# Patient Record
Sex: Female | Born: 1965
Health system: Southern US, Community
[De-identification: ages and names within clinical notes are randomized; demographics above are authoritative.]

## PROBLEM LIST (undated history)

## (undated) DIAGNOSIS — D649 Anemia, unspecified: Secondary | ICD-10-CM

## (undated) DIAGNOSIS — I2699 Other pulmonary embolism without acute cor pulmonale: Secondary | ICD-10-CM

## (undated) DIAGNOSIS — N189 Chronic kidney disease, unspecified: Secondary | ICD-10-CM

## (undated) DIAGNOSIS — R600 Localized edema: Secondary | ICD-10-CM

## (undated) DIAGNOSIS — L03119 Cellulitis of unspecified part of limb: Secondary | ICD-10-CM

## (undated) DIAGNOSIS — M1A9XX0 Chronic gout, unspecified, without tophus (tophi): Secondary | ICD-10-CM

## (undated) HISTORY — DX: Localized edema: R60.0

## (undated) HISTORY — DX: Other pulmonary embolism without acute cor pulmonale: I26.99

## (undated) HISTORY — DX: Cellulitis of unspecified part of limb: L03.119

## (undated) HISTORY — DX: Chronic kidney disease, unspecified: N18.9

## (undated) HISTORY — DX: Anemia, unspecified: D64.9

## (undated) HISTORY — DX: Chronic gout, unspecified, without tophus (tophi): M1A.9XX0

---

## 2015-07-29 DIAGNOSIS — D638 Anemia in other chronic diseases classified elsewhere: Secondary | ICD-10-CM | POA: Diagnosis not present

## 2015-07-29 DIAGNOSIS — R079 Chest pain, unspecified: Secondary | ICD-10-CM | POA: Diagnosis not present

## 2015-07-29 DIAGNOSIS — K219 Gastro-esophageal reflux disease without esophagitis: Secondary | ICD-10-CM | POA: Diagnosis not present

## 2015-07-29 DIAGNOSIS — J069 Acute upper respiratory infection, unspecified: Secondary | ICD-10-CM | POA: Diagnosis not present

## 2015-07-29 DIAGNOSIS — R0602 Shortness of breath: Secondary | ICD-10-CM | POA: Diagnosis not present

## 2015-07-29 DIAGNOSIS — I120 Hypertensive chronic kidney disease with stage 5 chronic kidney disease or end stage renal disease: Secondary | ICD-10-CM | POA: Diagnosis not present

## 2015-07-29 DIAGNOSIS — N186 End stage renal disease: Secondary | ICD-10-CM | POA: Diagnosis not present

## 2015-07-29 DIAGNOSIS — N189 Chronic kidney disease, unspecified: Secondary | ICD-10-CM | POA: Diagnosis not present

## 2015-07-29 DIAGNOSIS — E785 Hyperlipidemia, unspecified: Secondary | ICD-10-CM | POA: Diagnosis not present

## 2015-07-29 DIAGNOSIS — R0789 Other chest pain: Secondary | ICD-10-CM | POA: Diagnosis not present

## 2015-07-30 DIAGNOSIS — R0789 Other chest pain: Secondary | ICD-10-CM | POA: Diagnosis not present

## 2015-07-30 DIAGNOSIS — R079 Chest pain, unspecified: Secondary | ICD-10-CM | POA: Diagnosis not present

## 2015-08-03 DIAGNOSIS — R05 Cough: Secondary | ICD-10-CM | POA: Diagnosis not present

## 2015-08-03 DIAGNOSIS — R079 Chest pain, unspecified: Secondary | ICD-10-CM | POA: Diagnosis not present

## 2015-08-03 DIAGNOSIS — N189 Chronic kidney disease, unspecified: Secondary | ICD-10-CM | POA: Diagnosis not present

## 2015-08-08 DIAGNOSIS — Z3042 Encounter for surveillance of injectable contraceptive: Secondary | ICD-10-CM | POA: Diagnosis not present

## 2015-08-08 DIAGNOSIS — N920 Excessive and frequent menstruation with regular cycle: Secondary | ICD-10-CM | POA: Diagnosis not present

## 2015-08-08 DIAGNOSIS — N938 Other specified abnormal uterine and vaginal bleeding: Secondary | ICD-10-CM | POA: Diagnosis not present

## 2015-09-28 DIAGNOSIS — R600 Localized edema: Secondary | ICD-10-CM | POA: Diagnosis not present

## 2015-09-28 DIAGNOSIS — N185 Chronic kidney disease, stage 5: Secondary | ICD-10-CM | POA: Diagnosis not present

## 2015-09-28 DIAGNOSIS — I120 Hypertensive chronic kidney disease with stage 5 chronic kidney disease or end stage renal disease: Secondary | ICD-10-CM | POA: Diagnosis not present

## 2015-09-28 DIAGNOSIS — D631 Anemia in chronic kidney disease: Secondary | ICD-10-CM | POA: Diagnosis not present

## 2015-10-02 DIAGNOSIS — I10 Essential (primary) hypertension: Secondary | ICD-10-CM | POA: Diagnosis not present

## 2015-10-02 DIAGNOSIS — D631 Anemia in chronic kidney disease: Secondary | ICD-10-CM | POA: Diagnosis not present

## 2015-10-02 DIAGNOSIS — R600 Localized edema: Secondary | ICD-10-CM | POA: Diagnosis not present

## 2015-10-02 DIAGNOSIS — N185 Chronic kidney disease, stage 5: Secondary | ICD-10-CM | POA: Diagnosis not present

## 2015-11-02 DIAGNOSIS — N920 Excessive and frequent menstruation with regular cycle: Secondary | ICD-10-CM | POA: Diagnosis not present

## 2015-11-02 DIAGNOSIS — N938 Other specified abnormal uterine and vaginal bleeding: Secondary | ICD-10-CM | POA: Diagnosis not present

## 2015-11-02 DIAGNOSIS — Z309 Encounter for contraceptive management, unspecified: Secondary | ICD-10-CM | POA: Diagnosis not present

## 2016-01-17 DIAGNOSIS — D631 Anemia in chronic kidney disease: Secondary | ICD-10-CM | POA: Diagnosis not present

## 2016-01-17 DIAGNOSIS — N185 Chronic kidney disease, stage 5: Secondary | ICD-10-CM | POA: Diagnosis not present

## 2016-01-17 DIAGNOSIS — R609 Edema, unspecified: Secondary | ICD-10-CM | POA: Diagnosis not present

## 2016-01-17 DIAGNOSIS — I10 Essential (primary) hypertension: Secondary | ICD-10-CM | POA: Diagnosis not present

## 2016-01-30 DIAGNOSIS — Z3042 Encounter for surveillance of injectable contraceptive: Secondary | ICD-10-CM | POA: Diagnosis not present

## 2016-02-29 DIAGNOSIS — D259 Leiomyoma of uterus, unspecified: Secondary | ICD-10-CM | POA: Diagnosis not present

## 2016-02-29 DIAGNOSIS — N189 Chronic kidney disease, unspecified: Secondary | ICD-10-CM | POA: Diagnosis not present

## 2016-02-29 DIAGNOSIS — Z1151 Encounter for screening for human papillomavirus (HPV): Secondary | ICD-10-CM | POA: Diagnosis not present

## 2016-02-29 DIAGNOSIS — I10 Essential (primary) hypertension: Secondary | ICD-10-CM | POA: Diagnosis not present

## 2016-02-29 DIAGNOSIS — Z124 Encounter for screening for malignant neoplasm of cervix: Secondary | ICD-10-CM | POA: Diagnosis not present

## 2016-02-29 DIAGNOSIS — N920 Excessive and frequent menstruation with regular cycle: Secondary | ICD-10-CM | POA: Diagnosis not present

## 2016-03-20 DIAGNOSIS — D631 Anemia in chronic kidney disease: Secondary | ICD-10-CM | POA: Diagnosis not present

## 2016-03-20 DIAGNOSIS — N185 Chronic kidney disease, stage 5: Secondary | ICD-10-CM | POA: Diagnosis not present

## 2016-03-20 DIAGNOSIS — I10 Essential (primary) hypertension: Secondary | ICD-10-CM | POA: Diagnosis not present

## 2016-03-20 DIAGNOSIS — M109 Gout, unspecified: Secondary | ICD-10-CM | POA: Diagnosis not present

## 2016-05-29 DIAGNOSIS — N250 Renal osteodystrophy: Secondary | ICD-10-CM | POA: Diagnosis not present

## 2016-05-29 DIAGNOSIS — D631 Anemia in chronic kidney disease: Secondary | ICD-10-CM | POA: Diagnosis not present

## 2016-05-29 DIAGNOSIS — M109 Gout, unspecified: Secondary | ICD-10-CM | POA: Diagnosis not present

## 2016-05-29 DIAGNOSIS — N185 Chronic kidney disease, stage 5: Secondary | ICD-10-CM | POA: Diagnosis not present

## 2016-05-29 DIAGNOSIS — I10 Essential (primary) hypertension: Secondary | ICD-10-CM | POA: Diagnosis not present

## 2016-05-29 DIAGNOSIS — I120 Hypertensive chronic kidney disease with stage 5 chronic kidney disease or end stage renal disease: Secondary | ICD-10-CM | POA: Diagnosis not present

## 2016-06-02 DIAGNOSIS — Z111 Encounter for screening for respiratory tuberculosis: Secondary | ICD-10-CM | POA: Diagnosis not present

## 2016-07-29 DIAGNOSIS — I120 Hypertensive chronic kidney disease with stage 5 chronic kidney disease or end stage renal disease: Secondary | ICD-10-CM | POA: Diagnosis not present

## 2016-07-29 DIAGNOSIS — M109 Gout, unspecified: Secondary | ICD-10-CM | POA: Diagnosis not present

## 2016-07-29 DIAGNOSIS — N185 Chronic kidney disease, stage 5: Secondary | ICD-10-CM | POA: Diagnosis not present

## 2016-07-29 DIAGNOSIS — Z6841 Body Mass Index (BMI) 40.0 and over, adult: Secondary | ICD-10-CM | POA: Diagnosis not present

## 2016-07-29 DIAGNOSIS — D631 Anemia in chronic kidney disease: Secondary | ICD-10-CM | POA: Diagnosis not present

## 2016-07-29 DIAGNOSIS — I10 Essential (primary) hypertension: Secondary | ICD-10-CM | POA: Diagnosis not present

## 2016-07-29 DIAGNOSIS — N250 Renal osteodystrophy: Secondary | ICD-10-CM | POA: Diagnosis not present

## 2016-08-11 DIAGNOSIS — J189 Pneumonia, unspecified organism: Secondary | ICD-10-CM | POA: Diagnosis not present

## 2016-08-11 DIAGNOSIS — J181 Lobar pneumonia, unspecified organism: Secondary | ICD-10-CM | POA: Diagnosis not present

## 2016-08-11 DIAGNOSIS — R404 Transient alteration of awareness: Secondary | ICD-10-CM | POA: Diagnosis not present

## 2016-08-11 DIAGNOSIS — Z8739 Personal history of other diseases of the musculoskeletal system and connective tissue: Secondary | ICD-10-CM | POA: Diagnosis not present

## 2016-08-11 DIAGNOSIS — I959 Hypotension, unspecified: Secondary | ICD-10-CM | POA: Diagnosis not present

## 2016-08-11 DIAGNOSIS — D729 Disorder of white blood cells, unspecified: Secondary | ICD-10-CM | POA: Diagnosis not present

## 2016-08-11 DIAGNOSIS — N185 Chronic kidney disease, stage 5: Secondary | ICD-10-CM | POA: Diagnosis not present

## 2016-08-11 DIAGNOSIS — I120 Hypertensive chronic kidney disease with stage 5 chronic kidney disease or end stage renal disease: Secondary | ICD-10-CM | POA: Diagnosis not present

## 2016-08-11 DIAGNOSIS — E875 Hyperkalemia: Secondary | ICD-10-CM | POA: Diagnosis not present

## 2016-08-11 DIAGNOSIS — R51 Headache: Secondary | ICD-10-CM | POA: Diagnosis not present

## 2016-08-11 DIAGNOSIS — R938 Abnormal findings on diagnostic imaging of other specified body structures: Secondary | ICD-10-CM | POA: Diagnosis not present

## 2016-08-11 DIAGNOSIS — G43909 Migraine, unspecified, not intractable, without status migrainosus: Secondary | ICD-10-CM | POA: Diagnosis present

## 2016-08-11 DIAGNOSIS — M109 Gout, unspecified: Secondary | ICD-10-CM | POA: Diagnosis present

## 2016-08-11 DIAGNOSIS — R55 Syncope and collapse: Secondary | ICD-10-CM | POA: Diagnosis not present

## 2016-08-11 DIAGNOSIS — E872 Acidosis: Secondary | ICD-10-CM | POA: Diagnosis not present

## 2016-08-11 DIAGNOSIS — K219 Gastro-esophageal reflux disease without esophagitis: Secondary | ICD-10-CM | POA: Diagnosis not present

## 2016-08-11 DIAGNOSIS — I10 Essential (primary) hypertension: Secondary | ICD-10-CM | POA: Insufficient documentation

## 2016-08-12 DIAGNOSIS — D729 Disorder of white blood cells, unspecified: Secondary | ICD-10-CM | POA: Insufficient documentation

## 2016-08-15 DIAGNOSIS — N185 Chronic kidney disease, stage 5: Secondary | ICD-10-CM | POA: Diagnosis not present

## 2016-08-22 DIAGNOSIS — R55 Syncope and collapse: Secondary | ICD-10-CM | POA: Diagnosis not present

## 2016-10-03 DIAGNOSIS — I10 Essential (primary) hypertension: Secondary | ICD-10-CM | POA: Diagnosis not present

## 2016-10-03 DIAGNOSIS — N185 Chronic kidney disease, stage 5: Secondary | ICD-10-CM | POA: Diagnosis not present

## 2016-10-03 DIAGNOSIS — J189 Pneumonia, unspecified organism: Secondary | ICD-10-CM | POA: Diagnosis not present

## 2016-10-03 DIAGNOSIS — E875 Hyperkalemia: Secondary | ICD-10-CM | POA: Diagnosis not present

## 2017-06-29 DIAGNOSIS — R05 Cough: Secondary | ICD-10-CM | POA: Diagnosis not present

## 2017-07-08 DIAGNOSIS — N185 Chronic kidney disease, stage 5: Secondary | ICD-10-CM | POA: Diagnosis not present

## 2017-07-08 DIAGNOSIS — Z01810 Encounter for preprocedural cardiovascular examination: Secondary | ICD-10-CM | POA: Diagnosis not present

## 2017-07-09 DIAGNOSIS — F411 Generalized anxiety disorder: Secondary | ICD-10-CM | POA: Diagnosis present

## 2017-07-09 DIAGNOSIS — N185 Chronic kidney disease, stage 5: Secondary | ICD-10-CM | POA: Diagnosis not present

## 2017-07-09 DIAGNOSIS — I120 Hypertensive chronic kidney disease with stage 5 chronic kidney disease or end stage renal disease: Secondary | ICD-10-CM | POA: Diagnosis not present

## 2017-07-09 DIAGNOSIS — N189 Chronic kidney disease, unspecified: Secondary | ICD-10-CM | POA: Diagnosis not present

## 2017-07-09 DIAGNOSIS — E8779 Other fluid overload: Secondary | ICD-10-CM | POA: Diagnosis not present

## 2017-07-09 DIAGNOSIS — N186 End stage renal disease: Secondary | ICD-10-CM | POA: Diagnosis not present

## 2017-07-09 DIAGNOSIS — Z992 Dependence on renal dialysis: Secondary | ICD-10-CM | POA: Diagnosis not present

## 2017-07-09 DIAGNOSIS — I10 Essential (primary) hypertension: Secondary | ICD-10-CM | POA: Diagnosis not present

## 2017-07-09 DIAGNOSIS — M109 Gout, unspecified: Secondary | ICD-10-CM | POA: Diagnosis present

## 2017-07-09 DIAGNOSIS — K219 Gastro-esophageal reflux disease without esophagitis: Secondary | ICD-10-CM | POA: Diagnosis not present

## 2017-07-09 DIAGNOSIS — N19 Unspecified kidney failure: Secondary | ICD-10-CM | POA: Diagnosis not present

## 2017-07-09 DIAGNOSIS — Z9049 Acquired absence of other specified parts of digestive tract: Secondary | ICD-10-CM | POA: Diagnosis not present

## 2017-07-09 DIAGNOSIS — E8770 Fluid overload, unspecified: Secondary | ICD-10-CM | POA: Diagnosis not present

## 2017-07-09 DIAGNOSIS — R0602 Shortness of breath: Secondary | ICD-10-CM | POA: Diagnosis not present

## 2017-07-09 DIAGNOSIS — R252 Cramp and spasm: Secondary | ICD-10-CM | POA: Diagnosis present

## 2017-07-09 DIAGNOSIS — Z452 Encounter for adjustment and management of vascular access device: Secondary | ICD-10-CM | POA: Diagnosis not present

## 2017-07-09 DIAGNOSIS — D631 Anemia in chronic kidney disease: Secondary | ICD-10-CM | POA: Diagnosis present

## 2017-07-09 DIAGNOSIS — K2970 Gastritis, unspecified, without bleeding: Secondary | ICD-10-CM | POA: Diagnosis present

## 2017-07-16 DIAGNOSIS — N2581 Secondary hyperparathyroidism of renal origin: Secondary | ICD-10-CM | POA: Diagnosis not present

## 2017-07-16 DIAGNOSIS — N186 End stage renal disease: Secondary | ICD-10-CM | POA: Diagnosis not present

## 2017-07-16 DIAGNOSIS — Z992 Dependence on renal dialysis: Secondary | ICD-10-CM | POA: Diagnosis not present

## 2017-07-16 DIAGNOSIS — E8770 Fluid overload, unspecified: Secondary | ICD-10-CM | POA: Diagnosis not present

## 2017-07-16 DIAGNOSIS — D509 Iron deficiency anemia, unspecified: Secondary | ICD-10-CM | POA: Diagnosis not present

## 2017-07-20 DIAGNOSIS — N186 End stage renal disease: Secondary | ICD-10-CM | POA: Diagnosis not present

## 2017-07-20 DIAGNOSIS — Z992 Dependence on renal dialysis: Secondary | ICD-10-CM | POA: Diagnosis not present

## 2017-07-20 DIAGNOSIS — D509 Iron deficiency anemia, unspecified: Secondary | ICD-10-CM | POA: Diagnosis not present

## 2017-07-20 DIAGNOSIS — E8770 Fluid overload, unspecified: Secondary | ICD-10-CM | POA: Diagnosis not present

## 2017-07-20 DIAGNOSIS — N2581 Secondary hyperparathyroidism of renal origin: Secondary | ICD-10-CM | POA: Diagnosis not present

## 2017-07-23 DIAGNOSIS — E8770 Fluid overload, unspecified: Secondary | ICD-10-CM | POA: Diagnosis not present

## 2017-07-23 DIAGNOSIS — D509 Iron deficiency anemia, unspecified: Secondary | ICD-10-CM | POA: Diagnosis not present

## 2017-07-23 DIAGNOSIS — N2581 Secondary hyperparathyroidism of renal origin: Secondary | ICD-10-CM | POA: Diagnosis not present

## 2017-07-23 DIAGNOSIS — N186 End stage renal disease: Secondary | ICD-10-CM | POA: Diagnosis not present

## 2017-07-23 DIAGNOSIS — Z992 Dependence on renal dialysis: Secondary | ICD-10-CM | POA: Diagnosis not present

## 2017-07-24 DIAGNOSIS — R1031 Right lower quadrant pain: Secondary | ICD-10-CM | POA: Diagnosis not present

## 2017-07-24 DIAGNOSIS — K219 Gastro-esophageal reflux disease without esophagitis: Secondary | ICD-10-CM | POA: Diagnosis not present

## 2017-07-24 DIAGNOSIS — I120 Hypertensive chronic kidney disease with stage 5 chronic kidney disease or end stage renal disease: Secondary | ICD-10-CM | POA: Diagnosis not present

## 2017-07-24 DIAGNOSIS — Z79899 Other long term (current) drug therapy: Secondary | ICD-10-CM | POA: Diagnosis not present

## 2017-07-24 DIAGNOSIS — E875 Hyperkalemia: Secondary | ICD-10-CM | POA: Diagnosis not present

## 2017-07-24 DIAGNOSIS — N186 End stage renal disease: Secondary | ICD-10-CM | POA: Diagnosis not present

## 2017-07-24 DIAGNOSIS — Z9851 Tubal ligation status: Secondary | ICD-10-CM | POA: Diagnosis not present

## 2017-07-24 DIAGNOSIS — Z992 Dependence on renal dialysis: Secondary | ICD-10-CM | POA: Diagnosis not present

## 2017-07-24 DIAGNOSIS — Z4902 Encounter for fitting and adjustment of peritoneal dialysis catheter: Secondary | ICD-10-CM | POA: Diagnosis not present

## 2017-07-24 DIAGNOSIS — R110 Nausea: Secondary | ICD-10-CM | POA: Diagnosis not present

## 2017-07-24 DIAGNOSIS — D72829 Elevated white blood cell count, unspecified: Secondary | ICD-10-CM | POA: Diagnosis not present

## 2017-07-24 DIAGNOSIS — N185 Chronic kidney disease, stage 5: Secondary | ICD-10-CM | POA: Diagnosis not present

## 2017-07-25 DIAGNOSIS — R110 Nausea: Secondary | ICD-10-CM | POA: Diagnosis not present

## 2017-07-25 DIAGNOSIS — E875 Hyperkalemia: Secondary | ICD-10-CM | POA: Diagnosis not present

## 2017-07-25 DIAGNOSIS — E8770 Fluid overload, unspecified: Secondary | ICD-10-CM | POA: Diagnosis not present

## 2017-07-25 DIAGNOSIS — R1031 Right lower quadrant pain: Secondary | ICD-10-CM | POA: Diagnosis not present

## 2017-07-25 DIAGNOSIS — Z992 Dependence on renal dialysis: Secondary | ICD-10-CM | POA: Diagnosis not present

## 2017-07-25 DIAGNOSIS — N186 End stage renal disease: Secondary | ICD-10-CM | POA: Diagnosis not present

## 2017-07-25 DIAGNOSIS — I120 Hypertensive chronic kidney disease with stage 5 chronic kidney disease or end stage renal disease: Secondary | ICD-10-CM | POA: Diagnosis not present

## 2017-07-25 DIAGNOSIS — D509 Iron deficiency anemia, unspecified: Secondary | ICD-10-CM | POA: Diagnosis not present

## 2017-07-25 DIAGNOSIS — N2581 Secondary hyperparathyroidism of renal origin: Secondary | ICD-10-CM | POA: Diagnosis not present

## 2017-07-25 DIAGNOSIS — D72829 Elevated white blood cell count, unspecified: Secondary | ICD-10-CM | POA: Diagnosis not present

## 2017-07-30 DIAGNOSIS — E8770 Fluid overload, unspecified: Secondary | ICD-10-CM | POA: Diagnosis not present

## 2017-07-30 DIAGNOSIS — N186 End stage renal disease: Secondary | ICD-10-CM | POA: Diagnosis not present

## 2017-07-30 DIAGNOSIS — D509 Iron deficiency anemia, unspecified: Secondary | ICD-10-CM | POA: Diagnosis not present

## 2017-07-30 DIAGNOSIS — Z992 Dependence on renal dialysis: Secondary | ICD-10-CM | POA: Diagnosis not present

## 2017-07-30 DIAGNOSIS — N2581 Secondary hyperparathyroidism of renal origin: Secondary | ICD-10-CM | POA: Diagnosis not present

## 2017-08-01 DIAGNOSIS — T81718A Complication of other artery following a procedure, not elsewhere classified, initial encounter: Secondary | ICD-10-CM | POA: Diagnosis not present

## 2017-08-01 DIAGNOSIS — N2581 Secondary hyperparathyroidism of renal origin: Secondary | ICD-10-CM | POA: Diagnosis not present

## 2017-08-01 DIAGNOSIS — N186 End stage renal disease: Secondary | ICD-10-CM | POA: Diagnosis not present

## 2017-08-01 DIAGNOSIS — Z992 Dependence on renal dialysis: Secondary | ICD-10-CM | POA: Diagnosis not present

## 2017-08-01 DIAGNOSIS — Z6841 Body Mass Index (BMI) 40.0 and over, adult: Secondary | ICD-10-CM | POA: Diagnosis not present

## 2017-08-01 DIAGNOSIS — I2699 Other pulmonary embolism without acute cor pulmonale: Secondary | ICD-10-CM | POA: Diagnosis not present

## 2017-08-01 DIAGNOSIS — D631 Anemia in chronic kidney disease: Secondary | ICD-10-CM | POA: Diagnosis not present

## 2017-08-01 DIAGNOSIS — I120 Hypertensive chronic kidney disease with stage 5 chronic kidney disease or end stage renal disease: Secondary | ICD-10-CM | POA: Diagnosis not present

## 2017-08-01 DIAGNOSIS — R079 Chest pain, unspecified: Secondary | ICD-10-CM | POA: Diagnosis not present

## 2017-08-02 DIAGNOSIS — Z888 Allergy status to other drugs, medicaments and biological substances status: Secondary | ICD-10-CM | POA: Diagnosis not present

## 2017-08-02 DIAGNOSIS — I2699 Other pulmonary embolism without acute cor pulmonale: Secondary | ICD-10-CM | POA: Diagnosis not present

## 2017-08-02 DIAGNOSIS — D631 Anemia in chronic kidney disease: Secondary | ICD-10-CM | POA: Diagnosis present

## 2017-08-02 DIAGNOSIS — K219 Gastro-esophageal reflux disease without esophagitis: Secondary | ICD-10-CM | POA: Diagnosis present

## 2017-08-02 DIAGNOSIS — T81718A Complication of other artery following a procedure, not elsewhere classified, initial encounter: Secondary | ICD-10-CM | POA: Diagnosis present

## 2017-08-02 DIAGNOSIS — Z992 Dependence on renal dialysis: Secondary | ICD-10-CM | POA: Diagnosis not present

## 2017-08-02 DIAGNOSIS — M10071 Idiopathic gout, right ankle and foot: Secondary | ICD-10-CM | POA: Diagnosis not present

## 2017-08-02 DIAGNOSIS — M109 Gout, unspecified: Secondary | ICD-10-CM | POA: Diagnosis not present

## 2017-08-02 DIAGNOSIS — Z9049 Acquired absence of other specified parts of digestive tract: Secondary | ICD-10-CM | POA: Diagnosis not present

## 2017-08-02 DIAGNOSIS — Z6841 Body Mass Index (BMI) 40.0 and over, adult: Secondary | ICD-10-CM | POA: Diagnosis not present

## 2017-08-02 DIAGNOSIS — N924 Excessive bleeding in the premenopausal period: Secondary | ICD-10-CM | POA: Diagnosis not present

## 2017-08-02 DIAGNOSIS — I120 Hypertensive chronic kidney disease with stage 5 chronic kidney disease or end stage renal disease: Secondary | ICD-10-CM | POA: Diagnosis present

## 2017-08-02 DIAGNOSIS — N186 End stage renal disease: Secondary | ICD-10-CM | POA: Diagnosis not present

## 2017-08-02 DIAGNOSIS — Z452 Encounter for adjustment and management of vascular access device: Secondary | ICD-10-CM | POA: Diagnosis not present

## 2017-08-02 DIAGNOSIS — I2782 Chronic pulmonary embolism: Secondary | ICD-10-CM | POA: Diagnosis not present

## 2017-08-02 DIAGNOSIS — I2609 Other pulmonary embolism with acute cor pulmonale: Secondary | ICD-10-CM | POA: Diagnosis not present

## 2017-08-08 DIAGNOSIS — N951 Menopausal and female climacteric states: Secondary | ICD-10-CM | POA: Insufficient documentation

## 2017-08-10 DIAGNOSIS — N186 End stage renal disease: Secondary | ICD-10-CM | POA: Diagnosis not present

## 2017-08-10 DIAGNOSIS — Z992 Dependence on renal dialysis: Secondary | ICD-10-CM | POA: Diagnosis not present

## 2017-08-11 DIAGNOSIS — I82409 Acute embolism and thrombosis of unspecified deep veins of unspecified lower extremity: Secondary | ICD-10-CM | POA: Diagnosis not present

## 2017-08-11 DIAGNOSIS — Z7901 Long term (current) use of anticoagulants: Secondary | ICD-10-CM | POA: Diagnosis not present

## 2017-08-11 DIAGNOSIS — N185 Chronic kidney disease, stage 5: Secondary | ICD-10-CM | POA: Diagnosis not present

## 2017-08-11 DIAGNOSIS — Z5181 Encounter for therapeutic drug level monitoring: Secondary | ICD-10-CM | POA: Diagnosis not present

## 2017-08-11 DIAGNOSIS — Z992 Dependence on renal dialysis: Secondary | ICD-10-CM | POA: Diagnosis not present

## 2017-08-11 DIAGNOSIS — N186 End stage renal disease: Secondary | ICD-10-CM | POA: Diagnosis not present

## 2017-08-12 DIAGNOSIS — Z992 Dependence on renal dialysis: Secondary | ICD-10-CM | POA: Diagnosis not present

## 2017-08-12 DIAGNOSIS — N186 End stage renal disease: Secondary | ICD-10-CM | POA: Diagnosis not present

## 2017-08-12 DIAGNOSIS — Z1159 Encounter for screening for other viral diseases: Secondary | ICD-10-CM | POA: Diagnosis not present

## 2017-08-13 DIAGNOSIS — N186 End stage renal disease: Secondary | ICD-10-CM | POA: Diagnosis not present

## 2017-08-13 DIAGNOSIS — Z992 Dependence on renal dialysis: Secondary | ICD-10-CM | POA: Diagnosis not present

## 2017-08-18 DIAGNOSIS — N186 End stage renal disease: Secondary | ICD-10-CM | POA: Diagnosis not present

## 2017-08-18 DIAGNOSIS — Z992 Dependence on renal dialysis: Secondary | ICD-10-CM | POA: Diagnosis not present

## 2017-08-24 DIAGNOSIS — Z992 Dependence on renal dialysis: Secondary | ICD-10-CM | POA: Diagnosis not present

## 2017-08-24 DIAGNOSIS — N186 End stage renal disease: Secondary | ICD-10-CM | POA: Diagnosis not present

## 2017-08-25 DIAGNOSIS — N186 End stage renal disease: Secondary | ICD-10-CM | POA: Diagnosis not present

## 2017-08-25 DIAGNOSIS — Z992 Dependence on renal dialysis: Secondary | ICD-10-CM | POA: Diagnosis not present

## 2017-08-26 DIAGNOSIS — Z992 Dependence on renal dialysis: Secondary | ICD-10-CM | POA: Diagnosis not present

## 2017-08-26 DIAGNOSIS — N186 End stage renal disease: Secondary | ICD-10-CM | POA: Diagnosis not present

## 2017-08-27 DIAGNOSIS — N186 End stage renal disease: Secondary | ICD-10-CM | POA: Diagnosis not present

## 2017-08-27 DIAGNOSIS — Z992 Dependence on renal dialysis: Secondary | ICD-10-CM | POA: Diagnosis not present

## 2017-08-28 DIAGNOSIS — N2581 Secondary hyperparathyroidism of renal origin: Secondary | ICD-10-CM | POA: Diagnosis not present

## 2017-08-28 DIAGNOSIS — Z23 Encounter for immunization: Secondary | ICD-10-CM | POA: Diagnosis not present

## 2017-08-28 DIAGNOSIS — Z992 Dependence on renal dialysis: Secondary | ICD-10-CM | POA: Diagnosis not present

## 2017-08-28 DIAGNOSIS — N186 End stage renal disease: Secondary | ICD-10-CM | POA: Diagnosis not present

## 2017-08-28 DIAGNOSIS — D631 Anemia in chronic kidney disease: Secondary | ICD-10-CM | POA: Diagnosis not present

## 2017-08-28 DIAGNOSIS — D509 Iron deficiency anemia, unspecified: Secondary | ICD-10-CM | POA: Diagnosis not present

## 2017-08-29 DIAGNOSIS — D509 Iron deficiency anemia, unspecified: Secondary | ICD-10-CM | POA: Diagnosis not present

## 2017-08-29 DIAGNOSIS — Z23 Encounter for immunization: Secondary | ICD-10-CM | POA: Diagnosis not present

## 2017-08-29 DIAGNOSIS — Z992 Dependence on renal dialysis: Secondary | ICD-10-CM | POA: Diagnosis not present

## 2017-08-29 DIAGNOSIS — D631 Anemia in chronic kidney disease: Secondary | ICD-10-CM | POA: Diagnosis not present

## 2017-08-29 DIAGNOSIS — N2581 Secondary hyperparathyroidism of renal origin: Secondary | ICD-10-CM | POA: Diagnosis not present

## 2017-08-29 DIAGNOSIS — N186 End stage renal disease: Secondary | ICD-10-CM | POA: Diagnosis not present

## 2017-08-30 DIAGNOSIS — Z992 Dependence on renal dialysis: Secondary | ICD-10-CM | POA: Diagnosis not present

## 2017-08-30 DIAGNOSIS — D509 Iron deficiency anemia, unspecified: Secondary | ICD-10-CM | POA: Diagnosis not present

## 2017-08-30 DIAGNOSIS — D631 Anemia in chronic kidney disease: Secondary | ICD-10-CM | POA: Diagnosis not present

## 2017-08-30 DIAGNOSIS — N186 End stage renal disease: Secondary | ICD-10-CM | POA: Diagnosis not present

## 2017-08-30 DIAGNOSIS — N2581 Secondary hyperparathyroidism of renal origin: Secondary | ICD-10-CM | POA: Diagnosis not present

## 2017-08-30 DIAGNOSIS — Z23 Encounter for immunization: Secondary | ICD-10-CM | POA: Diagnosis not present

## 2017-08-31 DIAGNOSIS — N186 End stage renal disease: Secondary | ICD-10-CM | POA: Diagnosis not present

## 2017-08-31 DIAGNOSIS — Z992 Dependence on renal dialysis: Secondary | ICD-10-CM | POA: Diagnosis not present

## 2017-08-31 DIAGNOSIS — D631 Anemia in chronic kidney disease: Secondary | ICD-10-CM | POA: Diagnosis not present

## 2017-08-31 DIAGNOSIS — Z23 Encounter for immunization: Secondary | ICD-10-CM | POA: Diagnosis not present

## 2017-08-31 DIAGNOSIS — N2581 Secondary hyperparathyroidism of renal origin: Secondary | ICD-10-CM | POA: Diagnosis not present

## 2017-08-31 DIAGNOSIS — D509 Iron deficiency anemia, unspecified: Secondary | ICD-10-CM | POA: Diagnosis not present

## 2017-09-01 DIAGNOSIS — N186 End stage renal disease: Secondary | ICD-10-CM | POA: Diagnosis not present

## 2017-09-01 DIAGNOSIS — D509 Iron deficiency anemia, unspecified: Secondary | ICD-10-CM | POA: Diagnosis not present

## 2017-09-01 DIAGNOSIS — Z23 Encounter for immunization: Secondary | ICD-10-CM | POA: Diagnosis not present

## 2017-09-01 DIAGNOSIS — N2581 Secondary hyperparathyroidism of renal origin: Secondary | ICD-10-CM | POA: Diagnosis not present

## 2017-09-01 DIAGNOSIS — D631 Anemia in chronic kidney disease: Secondary | ICD-10-CM | POA: Diagnosis not present

## 2017-09-01 DIAGNOSIS — Z992 Dependence on renal dialysis: Secondary | ICD-10-CM | POA: Diagnosis not present

## 2017-09-02 DIAGNOSIS — D509 Iron deficiency anemia, unspecified: Secondary | ICD-10-CM | POA: Diagnosis not present

## 2017-09-02 DIAGNOSIS — N186 End stage renal disease: Secondary | ICD-10-CM | POA: Diagnosis not present

## 2017-09-02 DIAGNOSIS — Z992 Dependence on renal dialysis: Secondary | ICD-10-CM | POA: Diagnosis not present

## 2017-09-02 DIAGNOSIS — Z1159 Encounter for screening for other viral diseases: Secondary | ICD-10-CM | POA: Diagnosis not present

## 2017-09-02 DIAGNOSIS — Z23 Encounter for immunization: Secondary | ICD-10-CM | POA: Diagnosis not present

## 2017-09-02 DIAGNOSIS — N2581 Secondary hyperparathyroidism of renal origin: Secondary | ICD-10-CM | POA: Diagnosis not present

## 2017-09-02 DIAGNOSIS — D631 Anemia in chronic kidney disease: Secondary | ICD-10-CM | POA: Diagnosis not present

## 2017-09-03 DIAGNOSIS — N186 End stage renal disease: Secondary | ICD-10-CM | POA: Diagnosis not present

## 2017-09-03 DIAGNOSIS — Z992 Dependence on renal dialysis: Secondary | ICD-10-CM | POA: Diagnosis not present

## 2017-09-03 DIAGNOSIS — Z23 Encounter for immunization: Secondary | ICD-10-CM | POA: Diagnosis not present

## 2017-09-03 DIAGNOSIS — D631 Anemia in chronic kidney disease: Secondary | ICD-10-CM | POA: Diagnosis not present

## 2017-09-03 DIAGNOSIS — D509 Iron deficiency anemia, unspecified: Secondary | ICD-10-CM | POA: Diagnosis not present

## 2017-09-03 DIAGNOSIS — N2581 Secondary hyperparathyroidism of renal origin: Secondary | ICD-10-CM | POA: Diagnosis not present

## 2017-09-04 DIAGNOSIS — Z23 Encounter for immunization: Secondary | ICD-10-CM | POA: Diagnosis not present

## 2017-09-04 DIAGNOSIS — N186 End stage renal disease: Secondary | ICD-10-CM | POA: Diagnosis not present

## 2017-09-04 DIAGNOSIS — Z992 Dependence on renal dialysis: Secondary | ICD-10-CM | POA: Diagnosis not present

## 2017-09-04 DIAGNOSIS — D509 Iron deficiency anemia, unspecified: Secondary | ICD-10-CM | POA: Diagnosis not present

## 2017-09-04 DIAGNOSIS — D631 Anemia in chronic kidney disease: Secondary | ICD-10-CM | POA: Diagnosis not present

## 2017-09-04 DIAGNOSIS — N2581 Secondary hyperparathyroidism of renal origin: Secondary | ICD-10-CM | POA: Diagnosis not present

## 2017-09-05 DIAGNOSIS — D509 Iron deficiency anemia, unspecified: Secondary | ICD-10-CM | POA: Diagnosis not present

## 2017-09-05 DIAGNOSIS — D631 Anemia in chronic kidney disease: Secondary | ICD-10-CM | POA: Diagnosis not present

## 2017-09-05 DIAGNOSIS — N186 End stage renal disease: Secondary | ICD-10-CM | POA: Diagnosis not present

## 2017-09-05 DIAGNOSIS — Z23 Encounter for immunization: Secondary | ICD-10-CM | POA: Diagnosis not present

## 2017-09-05 DIAGNOSIS — N2581 Secondary hyperparathyroidism of renal origin: Secondary | ICD-10-CM | POA: Diagnosis not present

## 2017-09-05 DIAGNOSIS — Z992 Dependence on renal dialysis: Secondary | ICD-10-CM | POA: Diagnosis not present

## 2017-09-06 DIAGNOSIS — D509 Iron deficiency anemia, unspecified: Secondary | ICD-10-CM | POA: Diagnosis not present

## 2017-09-06 DIAGNOSIS — N2581 Secondary hyperparathyroidism of renal origin: Secondary | ICD-10-CM | POA: Diagnosis not present

## 2017-09-06 DIAGNOSIS — Z992 Dependence on renal dialysis: Secondary | ICD-10-CM | POA: Diagnosis not present

## 2017-09-06 DIAGNOSIS — N186 End stage renal disease: Secondary | ICD-10-CM | POA: Diagnosis not present

## 2017-09-06 DIAGNOSIS — D631 Anemia in chronic kidney disease: Secondary | ICD-10-CM | POA: Diagnosis not present

## 2017-09-06 DIAGNOSIS — Z23 Encounter for immunization: Secondary | ICD-10-CM | POA: Diagnosis not present

## 2017-09-07 DIAGNOSIS — N186 End stage renal disease: Secondary | ICD-10-CM | POA: Diagnosis not present

## 2017-09-07 DIAGNOSIS — Z23 Encounter for immunization: Secondary | ICD-10-CM | POA: Diagnosis not present

## 2017-09-07 DIAGNOSIS — D509 Iron deficiency anemia, unspecified: Secondary | ICD-10-CM | POA: Diagnosis not present

## 2017-09-07 DIAGNOSIS — N2581 Secondary hyperparathyroidism of renal origin: Secondary | ICD-10-CM | POA: Diagnosis not present

## 2017-09-07 DIAGNOSIS — D631 Anemia in chronic kidney disease: Secondary | ICD-10-CM | POA: Diagnosis not present

## 2017-09-07 DIAGNOSIS — Z992 Dependence on renal dialysis: Secondary | ICD-10-CM | POA: Diagnosis not present

## 2017-09-08 DIAGNOSIS — D509 Iron deficiency anemia, unspecified: Secondary | ICD-10-CM | POA: Diagnosis not present

## 2017-09-08 DIAGNOSIS — Z23 Encounter for immunization: Secondary | ICD-10-CM | POA: Diagnosis not present

## 2017-09-08 DIAGNOSIS — N186 End stage renal disease: Secondary | ICD-10-CM | POA: Diagnosis not present

## 2017-09-08 DIAGNOSIS — D631 Anemia in chronic kidney disease: Secondary | ICD-10-CM | POA: Diagnosis not present

## 2017-09-08 DIAGNOSIS — Z992 Dependence on renal dialysis: Secondary | ICD-10-CM | POA: Diagnosis not present

## 2017-09-08 DIAGNOSIS — N2581 Secondary hyperparathyroidism of renal origin: Secondary | ICD-10-CM | POA: Diagnosis not present

## 2017-09-09 DIAGNOSIS — D631 Anemia in chronic kidney disease: Secondary | ICD-10-CM | POA: Diagnosis not present

## 2017-09-09 DIAGNOSIS — Z7901 Long term (current) use of anticoagulants: Secondary | ICD-10-CM | POA: Diagnosis not present

## 2017-09-09 DIAGNOSIS — I82409 Acute embolism and thrombosis of unspecified deep veins of unspecified lower extremity: Secondary | ICD-10-CM | POA: Diagnosis not present

## 2017-09-09 DIAGNOSIS — N2581 Secondary hyperparathyroidism of renal origin: Secondary | ICD-10-CM | POA: Diagnosis not present

## 2017-09-09 DIAGNOSIS — Z5181 Encounter for therapeutic drug level monitoring: Secondary | ICD-10-CM | POA: Diagnosis not present

## 2017-09-09 DIAGNOSIS — Z992 Dependence on renal dialysis: Secondary | ICD-10-CM | POA: Diagnosis not present

## 2017-09-09 DIAGNOSIS — Z6841 Body Mass Index (BMI) 40.0 and over, adult: Secondary | ICD-10-CM | POA: Diagnosis not present

## 2017-09-09 DIAGNOSIS — N186 End stage renal disease: Secondary | ICD-10-CM | POA: Diagnosis not present

## 2017-09-09 DIAGNOSIS — D509 Iron deficiency anemia, unspecified: Secondary | ICD-10-CM | POA: Diagnosis not present

## 2017-09-09 DIAGNOSIS — Z23 Encounter for immunization: Secondary | ICD-10-CM | POA: Diagnosis not present

## 2017-09-10 DIAGNOSIS — Z23 Encounter for immunization: Secondary | ICD-10-CM | POA: Diagnosis not present

## 2017-09-10 DIAGNOSIS — N2581 Secondary hyperparathyroidism of renal origin: Secondary | ICD-10-CM | POA: Diagnosis not present

## 2017-09-10 DIAGNOSIS — Z992 Dependence on renal dialysis: Secondary | ICD-10-CM | POA: Diagnosis not present

## 2017-09-10 DIAGNOSIS — D631 Anemia in chronic kidney disease: Secondary | ICD-10-CM | POA: Diagnosis not present

## 2017-09-10 DIAGNOSIS — D509 Iron deficiency anemia, unspecified: Secondary | ICD-10-CM | POA: Diagnosis not present

## 2017-09-10 DIAGNOSIS — N186 End stage renal disease: Secondary | ICD-10-CM | POA: Diagnosis not present

## 2017-09-11 DIAGNOSIS — N186 End stage renal disease: Secondary | ICD-10-CM | POA: Diagnosis not present

## 2017-09-11 DIAGNOSIS — D509 Iron deficiency anemia, unspecified: Secondary | ICD-10-CM | POA: Diagnosis not present

## 2017-09-11 DIAGNOSIS — Z23 Encounter for immunization: Secondary | ICD-10-CM | POA: Diagnosis not present

## 2017-09-11 DIAGNOSIS — Z992 Dependence on renal dialysis: Secondary | ICD-10-CM | POA: Diagnosis not present

## 2017-09-11 DIAGNOSIS — D631 Anemia in chronic kidney disease: Secondary | ICD-10-CM | POA: Diagnosis not present

## 2017-09-11 DIAGNOSIS — N2581 Secondary hyperparathyroidism of renal origin: Secondary | ICD-10-CM | POA: Diagnosis not present

## 2017-09-12 DIAGNOSIS — D631 Anemia in chronic kidney disease: Secondary | ICD-10-CM | POA: Diagnosis not present

## 2017-09-12 DIAGNOSIS — D509 Iron deficiency anemia, unspecified: Secondary | ICD-10-CM | POA: Diagnosis not present

## 2017-09-12 DIAGNOSIS — N2581 Secondary hyperparathyroidism of renal origin: Secondary | ICD-10-CM | POA: Diagnosis not present

## 2017-09-12 DIAGNOSIS — Z23 Encounter for immunization: Secondary | ICD-10-CM | POA: Diagnosis not present

## 2017-09-12 DIAGNOSIS — N186 End stage renal disease: Secondary | ICD-10-CM | POA: Diagnosis not present

## 2017-09-12 DIAGNOSIS — Z992 Dependence on renal dialysis: Secondary | ICD-10-CM | POA: Diagnosis not present

## 2017-09-13 DIAGNOSIS — D631 Anemia in chronic kidney disease: Secondary | ICD-10-CM | POA: Diagnosis not present

## 2017-09-13 DIAGNOSIS — N186 End stage renal disease: Secondary | ICD-10-CM | POA: Diagnosis not present

## 2017-09-13 DIAGNOSIS — Z992 Dependence on renal dialysis: Secondary | ICD-10-CM | POA: Diagnosis not present

## 2017-09-13 DIAGNOSIS — N2581 Secondary hyperparathyroidism of renal origin: Secondary | ICD-10-CM | POA: Diagnosis not present

## 2017-09-13 DIAGNOSIS — D509 Iron deficiency anemia, unspecified: Secondary | ICD-10-CM | POA: Diagnosis not present

## 2017-09-13 DIAGNOSIS — Z23 Encounter for immunization: Secondary | ICD-10-CM | POA: Diagnosis not present

## 2017-09-14 DIAGNOSIS — Z992 Dependence on renal dialysis: Secondary | ICD-10-CM | POA: Diagnosis not present

## 2017-09-14 DIAGNOSIS — Z23 Encounter for immunization: Secondary | ICD-10-CM | POA: Diagnosis not present

## 2017-09-14 DIAGNOSIS — D509 Iron deficiency anemia, unspecified: Secondary | ICD-10-CM | POA: Diagnosis not present

## 2017-09-14 DIAGNOSIS — N186 End stage renal disease: Secondary | ICD-10-CM | POA: Diagnosis not present

## 2017-09-14 DIAGNOSIS — D631 Anemia in chronic kidney disease: Secondary | ICD-10-CM | POA: Diagnosis not present

## 2017-09-14 DIAGNOSIS — N2581 Secondary hyperparathyroidism of renal origin: Secondary | ICD-10-CM | POA: Diagnosis not present

## 2017-09-15 DIAGNOSIS — N186 End stage renal disease: Secondary | ICD-10-CM | POA: Diagnosis not present

## 2017-09-15 DIAGNOSIS — Z992 Dependence on renal dialysis: Secondary | ICD-10-CM | POA: Diagnosis not present

## 2017-09-15 DIAGNOSIS — D509 Iron deficiency anemia, unspecified: Secondary | ICD-10-CM | POA: Diagnosis not present

## 2017-09-15 DIAGNOSIS — D631 Anemia in chronic kidney disease: Secondary | ICD-10-CM | POA: Diagnosis not present

## 2017-09-15 DIAGNOSIS — Z23 Encounter for immunization: Secondary | ICD-10-CM | POA: Diagnosis not present

## 2017-09-15 DIAGNOSIS — N2581 Secondary hyperparathyroidism of renal origin: Secondary | ICD-10-CM | POA: Diagnosis not present

## 2017-09-16 DIAGNOSIS — N186 End stage renal disease: Secondary | ICD-10-CM | POA: Diagnosis not present

## 2017-09-16 DIAGNOSIS — Z992 Dependence on renal dialysis: Secondary | ICD-10-CM | POA: Diagnosis not present

## 2017-09-16 DIAGNOSIS — D631 Anemia in chronic kidney disease: Secondary | ICD-10-CM | POA: Diagnosis not present

## 2017-09-16 DIAGNOSIS — D509 Iron deficiency anemia, unspecified: Secondary | ICD-10-CM | POA: Diagnosis not present

## 2017-09-16 DIAGNOSIS — Z23 Encounter for immunization: Secondary | ICD-10-CM | POA: Diagnosis not present

## 2017-09-16 DIAGNOSIS — N2581 Secondary hyperparathyroidism of renal origin: Secondary | ICD-10-CM | POA: Diagnosis not present

## 2017-09-17 DIAGNOSIS — D631 Anemia in chronic kidney disease: Secondary | ICD-10-CM | POA: Diagnosis not present

## 2017-09-17 DIAGNOSIS — D509 Iron deficiency anemia, unspecified: Secondary | ICD-10-CM | POA: Diagnosis not present

## 2017-09-17 DIAGNOSIS — Z992 Dependence on renal dialysis: Secondary | ICD-10-CM | POA: Diagnosis not present

## 2017-09-17 DIAGNOSIS — N186 End stage renal disease: Secondary | ICD-10-CM | POA: Diagnosis not present

## 2017-09-17 DIAGNOSIS — N2581 Secondary hyperparathyroidism of renal origin: Secondary | ICD-10-CM | POA: Diagnosis not present

## 2017-09-17 DIAGNOSIS — Z23 Encounter for immunization: Secondary | ICD-10-CM | POA: Diagnosis not present

## 2017-09-18 DIAGNOSIS — N2581 Secondary hyperparathyroidism of renal origin: Secondary | ICD-10-CM | POA: Diagnosis not present

## 2017-09-18 DIAGNOSIS — Z23 Encounter for immunization: Secondary | ICD-10-CM | POA: Diagnosis not present

## 2017-09-18 DIAGNOSIS — N186 End stage renal disease: Secondary | ICD-10-CM | POA: Diagnosis not present

## 2017-09-18 DIAGNOSIS — D631 Anemia in chronic kidney disease: Secondary | ICD-10-CM | POA: Diagnosis not present

## 2017-09-18 DIAGNOSIS — D509 Iron deficiency anemia, unspecified: Secondary | ICD-10-CM | POA: Diagnosis not present

## 2017-09-18 DIAGNOSIS — Z992 Dependence on renal dialysis: Secondary | ICD-10-CM | POA: Diagnosis not present

## 2017-09-19 DIAGNOSIS — D509 Iron deficiency anemia, unspecified: Secondary | ICD-10-CM | POA: Diagnosis not present

## 2017-09-19 DIAGNOSIS — Z992 Dependence on renal dialysis: Secondary | ICD-10-CM | POA: Diagnosis not present

## 2017-09-19 DIAGNOSIS — N2581 Secondary hyperparathyroidism of renal origin: Secondary | ICD-10-CM | POA: Diagnosis not present

## 2017-09-19 DIAGNOSIS — Z23 Encounter for immunization: Secondary | ICD-10-CM | POA: Diagnosis not present

## 2017-09-19 DIAGNOSIS — D631 Anemia in chronic kidney disease: Secondary | ICD-10-CM | POA: Diagnosis not present

## 2017-09-19 DIAGNOSIS — N186 End stage renal disease: Secondary | ICD-10-CM | POA: Diagnosis not present

## 2017-09-20 DIAGNOSIS — N186 End stage renal disease: Secondary | ICD-10-CM | POA: Diagnosis not present

## 2017-09-20 DIAGNOSIS — Z992 Dependence on renal dialysis: Secondary | ICD-10-CM | POA: Diagnosis not present

## 2017-09-20 DIAGNOSIS — N2581 Secondary hyperparathyroidism of renal origin: Secondary | ICD-10-CM | POA: Diagnosis not present

## 2017-09-20 DIAGNOSIS — D631 Anemia in chronic kidney disease: Secondary | ICD-10-CM | POA: Diagnosis not present

## 2017-09-20 DIAGNOSIS — Z23 Encounter for immunization: Secondary | ICD-10-CM | POA: Diagnosis not present

## 2017-09-20 DIAGNOSIS — D509 Iron deficiency anemia, unspecified: Secondary | ICD-10-CM | POA: Diagnosis not present

## 2017-09-21 DIAGNOSIS — D631 Anemia in chronic kidney disease: Secondary | ICD-10-CM | POA: Diagnosis not present

## 2017-09-21 DIAGNOSIS — D509 Iron deficiency anemia, unspecified: Secondary | ICD-10-CM | POA: Diagnosis not present

## 2017-09-21 DIAGNOSIS — Z992 Dependence on renal dialysis: Secondary | ICD-10-CM | POA: Diagnosis not present

## 2017-09-21 DIAGNOSIS — Z23 Encounter for immunization: Secondary | ICD-10-CM | POA: Diagnosis not present

## 2017-09-21 DIAGNOSIS — N2581 Secondary hyperparathyroidism of renal origin: Secondary | ICD-10-CM | POA: Diagnosis not present

## 2017-09-21 DIAGNOSIS — N186 End stage renal disease: Secondary | ICD-10-CM | POA: Diagnosis not present

## 2017-09-22 DIAGNOSIS — Z23 Encounter for immunization: Secondary | ICD-10-CM | POA: Diagnosis not present

## 2017-09-22 DIAGNOSIS — Z992 Dependence on renal dialysis: Secondary | ICD-10-CM | POA: Diagnosis not present

## 2017-09-22 DIAGNOSIS — N186 End stage renal disease: Secondary | ICD-10-CM | POA: Diagnosis not present

## 2017-09-22 DIAGNOSIS — D509 Iron deficiency anemia, unspecified: Secondary | ICD-10-CM | POA: Diagnosis not present

## 2017-09-22 DIAGNOSIS — N2581 Secondary hyperparathyroidism of renal origin: Secondary | ICD-10-CM | POA: Diagnosis not present

## 2017-09-22 DIAGNOSIS — D631 Anemia in chronic kidney disease: Secondary | ICD-10-CM | POA: Diagnosis not present

## 2017-09-23 DIAGNOSIS — D509 Iron deficiency anemia, unspecified: Secondary | ICD-10-CM | POA: Diagnosis not present

## 2017-09-23 DIAGNOSIS — D631 Anemia in chronic kidney disease: Secondary | ICD-10-CM | POA: Diagnosis not present

## 2017-09-23 DIAGNOSIS — N2581 Secondary hyperparathyroidism of renal origin: Secondary | ICD-10-CM | POA: Diagnosis not present

## 2017-09-23 DIAGNOSIS — N186 End stage renal disease: Secondary | ICD-10-CM | POA: Diagnosis not present

## 2017-09-23 DIAGNOSIS — Z23 Encounter for immunization: Secondary | ICD-10-CM | POA: Diagnosis not present

## 2017-09-23 DIAGNOSIS — Z992 Dependence on renal dialysis: Secondary | ICD-10-CM | POA: Diagnosis not present

## 2017-09-24 DIAGNOSIS — Z23 Encounter for immunization: Secondary | ICD-10-CM | POA: Diagnosis not present

## 2017-09-24 DIAGNOSIS — N185 Chronic kidney disease, stage 5: Secondary | ICD-10-CM | POA: Diagnosis not present

## 2017-09-24 DIAGNOSIS — I2699 Other pulmonary embolism without acute cor pulmonale: Secondary | ICD-10-CM | POA: Diagnosis not present

## 2017-09-24 DIAGNOSIS — D509 Iron deficiency anemia, unspecified: Secondary | ICD-10-CM | POA: Diagnosis not present

## 2017-09-24 DIAGNOSIS — N2581 Secondary hyperparathyroidism of renal origin: Secondary | ICD-10-CM | POA: Diagnosis not present

## 2017-09-24 DIAGNOSIS — N186 End stage renal disease: Secondary | ICD-10-CM | POA: Diagnosis not present

## 2017-09-24 DIAGNOSIS — D631 Anemia in chronic kidney disease: Secondary | ICD-10-CM | POA: Diagnosis not present

## 2017-09-24 DIAGNOSIS — Z7901 Long term (current) use of anticoagulants: Secondary | ICD-10-CM | POA: Diagnosis not present

## 2017-09-24 DIAGNOSIS — Z992 Dependence on renal dialysis: Secondary | ICD-10-CM | POA: Diagnosis not present

## 2017-09-24 DIAGNOSIS — Z5181 Encounter for therapeutic drug level monitoring: Secondary | ICD-10-CM | POA: Diagnosis not present

## 2017-09-25 DIAGNOSIS — N2581 Secondary hyperparathyroidism of renal origin: Secondary | ICD-10-CM | POA: Diagnosis not present

## 2017-09-25 DIAGNOSIS — N186 End stage renal disease: Secondary | ICD-10-CM | POA: Diagnosis not present

## 2017-09-25 DIAGNOSIS — D631 Anemia in chronic kidney disease: Secondary | ICD-10-CM | POA: Diagnosis not present

## 2017-09-25 DIAGNOSIS — D509 Iron deficiency anemia, unspecified: Secondary | ICD-10-CM | POA: Diagnosis not present

## 2017-09-25 DIAGNOSIS — Z23 Encounter for immunization: Secondary | ICD-10-CM | POA: Diagnosis not present

## 2017-09-25 DIAGNOSIS — Z992 Dependence on renal dialysis: Secondary | ICD-10-CM | POA: Diagnosis not present

## 2017-09-26 DIAGNOSIS — D509 Iron deficiency anemia, unspecified: Secondary | ICD-10-CM | POA: Diagnosis not present

## 2017-09-26 DIAGNOSIS — N2581 Secondary hyperparathyroidism of renal origin: Secondary | ICD-10-CM | POA: Diagnosis not present

## 2017-09-26 DIAGNOSIS — Z23 Encounter for immunization: Secondary | ICD-10-CM | POA: Diagnosis not present

## 2017-09-26 DIAGNOSIS — D631 Anemia in chronic kidney disease: Secondary | ICD-10-CM | POA: Diagnosis not present

## 2017-09-26 DIAGNOSIS — N186 End stage renal disease: Secondary | ICD-10-CM | POA: Diagnosis not present

## 2017-09-26 DIAGNOSIS — Z992 Dependence on renal dialysis: Secondary | ICD-10-CM | POA: Diagnosis not present

## 2017-09-27 DIAGNOSIS — Z992 Dependence on renal dialysis: Secondary | ICD-10-CM | POA: Diagnosis not present

## 2017-09-27 DIAGNOSIS — N2581 Secondary hyperparathyroidism of renal origin: Secondary | ICD-10-CM | POA: Diagnosis not present

## 2017-09-27 DIAGNOSIS — Z23 Encounter for immunization: Secondary | ICD-10-CM | POA: Diagnosis not present

## 2017-09-27 DIAGNOSIS — D631 Anemia in chronic kidney disease: Secondary | ICD-10-CM | POA: Diagnosis not present

## 2017-09-27 DIAGNOSIS — D509 Iron deficiency anemia, unspecified: Secondary | ICD-10-CM | POA: Diagnosis not present

## 2017-09-27 DIAGNOSIS — N186 End stage renal disease: Secondary | ICD-10-CM | POA: Diagnosis not present

## 2017-09-28 DIAGNOSIS — Z992 Dependence on renal dialysis: Secondary | ICD-10-CM | POA: Diagnosis not present

## 2017-09-28 DIAGNOSIS — D631 Anemia in chronic kidney disease: Secondary | ICD-10-CM | POA: Diagnosis not present

## 2017-09-28 DIAGNOSIS — N2581 Secondary hyperparathyroidism of renal origin: Secondary | ICD-10-CM | POA: Diagnosis not present

## 2017-09-28 DIAGNOSIS — N186 End stage renal disease: Secondary | ICD-10-CM | POA: Diagnosis not present

## 2017-09-28 DIAGNOSIS — D509 Iron deficiency anemia, unspecified: Secondary | ICD-10-CM | POA: Diagnosis not present

## 2017-09-29 DIAGNOSIS — N2581 Secondary hyperparathyroidism of renal origin: Secondary | ICD-10-CM | POA: Diagnosis not present

## 2017-09-29 DIAGNOSIS — D631 Anemia in chronic kidney disease: Secondary | ICD-10-CM | POA: Diagnosis not present

## 2017-09-29 DIAGNOSIS — Z992 Dependence on renal dialysis: Secondary | ICD-10-CM | POA: Diagnosis not present

## 2017-09-29 DIAGNOSIS — D509 Iron deficiency anemia, unspecified: Secondary | ICD-10-CM | POA: Diagnosis not present

## 2017-09-29 DIAGNOSIS — N186 End stage renal disease: Secondary | ICD-10-CM | POA: Diagnosis not present

## 2017-09-30 DIAGNOSIS — Z992 Dependence on renal dialysis: Secondary | ICD-10-CM | POA: Diagnosis not present

## 2017-09-30 DIAGNOSIS — N186 End stage renal disease: Secondary | ICD-10-CM | POA: Diagnosis not present

## 2017-09-30 DIAGNOSIS — D509 Iron deficiency anemia, unspecified: Secondary | ICD-10-CM | POA: Diagnosis not present

## 2017-09-30 DIAGNOSIS — N2581 Secondary hyperparathyroidism of renal origin: Secondary | ICD-10-CM | POA: Diagnosis not present

## 2017-09-30 DIAGNOSIS — D631 Anemia in chronic kidney disease: Secondary | ICD-10-CM | POA: Diagnosis not present

## 2017-10-01 DIAGNOSIS — N186 End stage renal disease: Secondary | ICD-10-CM | POA: Diagnosis not present

## 2017-10-01 DIAGNOSIS — Z992 Dependence on renal dialysis: Secondary | ICD-10-CM | POA: Diagnosis not present

## 2017-10-01 DIAGNOSIS — D509 Iron deficiency anemia, unspecified: Secondary | ICD-10-CM | POA: Diagnosis not present

## 2017-10-01 DIAGNOSIS — Z6841 Body Mass Index (BMI) 40.0 and over, adult: Secondary | ICD-10-CM | POA: Diagnosis not present

## 2017-10-01 DIAGNOSIS — I2699 Other pulmonary embolism without acute cor pulmonale: Secondary | ICD-10-CM | POA: Diagnosis not present

## 2017-10-01 DIAGNOSIS — Z7901 Long term (current) use of anticoagulants: Secondary | ICD-10-CM | POA: Diagnosis not present

## 2017-10-01 DIAGNOSIS — N2581 Secondary hyperparathyroidism of renal origin: Secondary | ICD-10-CM | POA: Diagnosis not present

## 2017-10-01 DIAGNOSIS — D631 Anemia in chronic kidney disease: Secondary | ICD-10-CM | POA: Diagnosis not present

## 2017-10-01 DIAGNOSIS — Z5181 Encounter for therapeutic drug level monitoring: Secondary | ICD-10-CM | POA: Diagnosis not present

## 2017-10-02 DIAGNOSIS — N186 End stage renal disease: Secondary | ICD-10-CM | POA: Diagnosis not present

## 2017-10-02 DIAGNOSIS — Z992 Dependence on renal dialysis: Secondary | ICD-10-CM | POA: Diagnosis not present

## 2017-10-02 DIAGNOSIS — N2581 Secondary hyperparathyroidism of renal origin: Secondary | ICD-10-CM | POA: Diagnosis not present

## 2017-10-02 DIAGNOSIS — D631 Anemia in chronic kidney disease: Secondary | ICD-10-CM | POA: Diagnosis not present

## 2017-10-02 DIAGNOSIS — D509 Iron deficiency anemia, unspecified: Secondary | ICD-10-CM | POA: Diagnosis not present

## 2017-10-03 DIAGNOSIS — D631 Anemia in chronic kidney disease: Secondary | ICD-10-CM | POA: Diagnosis not present

## 2017-10-03 DIAGNOSIS — Z992 Dependence on renal dialysis: Secondary | ICD-10-CM | POA: Diagnosis not present

## 2017-10-03 DIAGNOSIS — D509 Iron deficiency anemia, unspecified: Secondary | ICD-10-CM | POA: Diagnosis not present

## 2017-10-03 DIAGNOSIS — N2581 Secondary hyperparathyroidism of renal origin: Secondary | ICD-10-CM | POA: Diagnosis not present

## 2017-10-03 DIAGNOSIS — N186 End stage renal disease: Secondary | ICD-10-CM | POA: Diagnosis not present

## 2017-10-04 DIAGNOSIS — D631 Anemia in chronic kidney disease: Secondary | ICD-10-CM | POA: Diagnosis not present

## 2017-10-04 DIAGNOSIS — N2581 Secondary hyperparathyroidism of renal origin: Secondary | ICD-10-CM | POA: Diagnosis not present

## 2017-10-04 DIAGNOSIS — Z992 Dependence on renal dialysis: Secondary | ICD-10-CM | POA: Diagnosis not present

## 2017-10-04 DIAGNOSIS — D509 Iron deficiency anemia, unspecified: Secondary | ICD-10-CM | POA: Diagnosis not present

## 2017-10-04 DIAGNOSIS — N186 End stage renal disease: Secondary | ICD-10-CM | POA: Diagnosis not present

## 2017-10-05 DIAGNOSIS — D509 Iron deficiency anemia, unspecified: Secondary | ICD-10-CM | POA: Diagnosis not present

## 2017-10-05 DIAGNOSIS — N2581 Secondary hyperparathyroidism of renal origin: Secondary | ICD-10-CM | POA: Diagnosis not present

## 2017-10-05 DIAGNOSIS — N186 End stage renal disease: Secondary | ICD-10-CM | POA: Diagnosis not present

## 2017-10-05 DIAGNOSIS — D631 Anemia in chronic kidney disease: Secondary | ICD-10-CM | POA: Diagnosis not present

## 2017-10-05 DIAGNOSIS — Z992 Dependence on renal dialysis: Secondary | ICD-10-CM | POA: Diagnosis not present

## 2017-10-06 DIAGNOSIS — N186 End stage renal disease: Secondary | ICD-10-CM | POA: Diagnosis not present

## 2017-10-06 DIAGNOSIS — Z992 Dependence on renal dialysis: Secondary | ICD-10-CM | POA: Diagnosis not present

## 2017-10-07 DIAGNOSIS — Z992 Dependence on renal dialysis: Secondary | ICD-10-CM | POA: Diagnosis not present

## 2017-10-07 DIAGNOSIS — N186 End stage renal disease: Secondary | ICD-10-CM | POA: Diagnosis not present

## 2017-10-08 DIAGNOSIS — Z992 Dependence on renal dialysis: Secondary | ICD-10-CM | POA: Diagnosis not present

## 2017-10-08 DIAGNOSIS — N186 End stage renal disease: Secondary | ICD-10-CM | POA: Diagnosis not present

## 2017-10-09 DIAGNOSIS — Z992 Dependence on renal dialysis: Secondary | ICD-10-CM | POA: Diagnosis not present

## 2017-10-09 DIAGNOSIS — N186 End stage renal disease: Secondary | ICD-10-CM | POA: Diagnosis not present

## 2017-10-10 DIAGNOSIS — N186 End stage renal disease: Secondary | ICD-10-CM | POA: Diagnosis not present

## 2017-10-10 DIAGNOSIS — Z992 Dependence on renal dialysis: Secondary | ICD-10-CM | POA: Diagnosis not present

## 2017-10-11 DIAGNOSIS — N186 End stage renal disease: Secondary | ICD-10-CM | POA: Diagnosis not present

## 2017-10-11 DIAGNOSIS — Z992 Dependence on renal dialysis: Secondary | ICD-10-CM | POA: Diagnosis not present

## 2017-10-12 DIAGNOSIS — Z992 Dependence on renal dialysis: Secondary | ICD-10-CM | POA: Diagnosis not present

## 2017-10-12 DIAGNOSIS — N186 End stage renal disease: Secondary | ICD-10-CM | POA: Diagnosis not present

## 2017-10-13 DIAGNOSIS — Z7901 Long term (current) use of anticoagulants: Secondary | ICD-10-CM | POA: Diagnosis not present

## 2017-10-13 DIAGNOSIS — Z992 Dependence on renal dialysis: Secondary | ICD-10-CM | POA: Diagnosis not present

## 2017-10-13 DIAGNOSIS — Z6841 Body Mass Index (BMI) 40.0 and over, adult: Secondary | ICD-10-CM | POA: Diagnosis not present

## 2017-10-13 DIAGNOSIS — I2699 Other pulmonary embolism without acute cor pulmonale: Secondary | ICD-10-CM | POA: Diagnosis not present

## 2017-10-13 DIAGNOSIS — N186 End stage renal disease: Secondary | ICD-10-CM | POA: Diagnosis not present

## 2017-10-13 DIAGNOSIS — Z5181 Encounter for therapeutic drug level monitoring: Secondary | ICD-10-CM | POA: Diagnosis not present

## 2017-10-13 DIAGNOSIS — Z7189 Other specified counseling: Secondary | ICD-10-CM | POA: Diagnosis not present

## 2017-10-14 DIAGNOSIS — Z992 Dependence on renal dialysis: Secondary | ICD-10-CM | POA: Diagnosis not present

## 2017-10-14 DIAGNOSIS — N186 End stage renal disease: Secondary | ICD-10-CM | POA: Diagnosis not present

## 2017-10-15 DIAGNOSIS — Z992 Dependence on renal dialysis: Secondary | ICD-10-CM | POA: Diagnosis not present

## 2017-10-15 DIAGNOSIS — N186 End stage renal disease: Secondary | ICD-10-CM | POA: Diagnosis not present

## 2017-10-16 DIAGNOSIS — Z992 Dependence on renal dialysis: Secondary | ICD-10-CM | POA: Diagnosis not present

## 2017-10-16 DIAGNOSIS — N186 End stage renal disease: Secondary | ICD-10-CM | POA: Diagnosis not present

## 2017-10-17 DIAGNOSIS — N186 End stage renal disease: Secondary | ICD-10-CM | POA: Diagnosis not present

## 2017-10-17 DIAGNOSIS — Z992 Dependence on renal dialysis: Secondary | ICD-10-CM | POA: Diagnosis not present

## 2017-10-18 DIAGNOSIS — N186 End stage renal disease: Secondary | ICD-10-CM | POA: Diagnosis not present

## 2017-10-18 DIAGNOSIS — Z992 Dependence on renal dialysis: Secondary | ICD-10-CM | POA: Diagnosis not present

## 2017-10-19 DIAGNOSIS — N186 End stage renal disease: Secondary | ICD-10-CM | POA: Diagnosis not present

## 2017-10-19 DIAGNOSIS — Z992 Dependence on renal dialysis: Secondary | ICD-10-CM | POA: Diagnosis not present

## 2017-10-20 DIAGNOSIS — N186 End stage renal disease: Secondary | ICD-10-CM | POA: Diagnosis not present

## 2017-10-20 DIAGNOSIS — Z992 Dependence on renal dialysis: Secondary | ICD-10-CM | POA: Diagnosis not present

## 2017-10-21 DIAGNOSIS — Z992 Dependence on renal dialysis: Secondary | ICD-10-CM | POA: Diagnosis not present

## 2017-10-21 DIAGNOSIS — N186 End stage renal disease: Secondary | ICD-10-CM | POA: Diagnosis not present

## 2017-10-22 DIAGNOSIS — Z992 Dependence on renal dialysis: Secondary | ICD-10-CM | POA: Diagnosis not present

## 2017-10-22 DIAGNOSIS — N186 End stage renal disease: Secondary | ICD-10-CM | POA: Diagnosis not present

## 2017-10-23 DIAGNOSIS — Z992 Dependence on renal dialysis: Secondary | ICD-10-CM | POA: Diagnosis not present

## 2017-10-23 DIAGNOSIS — N186 End stage renal disease: Secondary | ICD-10-CM | POA: Diagnosis not present

## 2017-10-24 DIAGNOSIS — Z992 Dependence on renal dialysis: Secondary | ICD-10-CM | POA: Diagnosis not present

## 2017-10-24 DIAGNOSIS — N186 End stage renal disease: Secondary | ICD-10-CM | POA: Diagnosis not present

## 2017-10-25 DIAGNOSIS — Z992 Dependence on renal dialysis: Secondary | ICD-10-CM | POA: Diagnosis not present

## 2017-10-25 DIAGNOSIS — N186 End stage renal disease: Secondary | ICD-10-CM | POA: Diagnosis not present

## 2017-10-26 DIAGNOSIS — Z992 Dependence on renal dialysis: Secondary | ICD-10-CM | POA: Diagnosis not present

## 2017-10-26 DIAGNOSIS — N186 End stage renal disease: Secondary | ICD-10-CM | POA: Diagnosis not present

## 2017-10-27 DIAGNOSIS — Z7901 Long term (current) use of anticoagulants: Secondary | ICD-10-CM | POA: Diagnosis not present

## 2017-10-27 DIAGNOSIS — Z5181 Encounter for therapeutic drug level monitoring: Secondary | ICD-10-CM | POA: Diagnosis not present

## 2017-10-27 DIAGNOSIS — N186 End stage renal disease: Secondary | ICD-10-CM | POA: Diagnosis not present

## 2017-10-27 DIAGNOSIS — I2699 Other pulmonary embolism without acute cor pulmonale: Secondary | ICD-10-CM | POA: Diagnosis not present

## 2017-10-27 DIAGNOSIS — Z6841 Body Mass Index (BMI) 40.0 and over, adult: Secondary | ICD-10-CM | POA: Diagnosis not present

## 2017-10-27 DIAGNOSIS — Z992 Dependence on renal dialysis: Secondary | ICD-10-CM | POA: Diagnosis not present

## 2017-10-28 DIAGNOSIS — N2581 Secondary hyperparathyroidism of renal origin: Secondary | ICD-10-CM | POA: Diagnosis not present

## 2017-10-28 DIAGNOSIS — N186 End stage renal disease: Secondary | ICD-10-CM | POA: Diagnosis not present

## 2017-10-28 DIAGNOSIS — D509 Iron deficiency anemia, unspecified: Secondary | ICD-10-CM | POA: Diagnosis not present

## 2017-10-28 DIAGNOSIS — D631 Anemia in chronic kidney disease: Secondary | ICD-10-CM | POA: Diagnosis not present

## 2017-10-28 DIAGNOSIS — Z992 Dependence on renal dialysis: Secondary | ICD-10-CM | POA: Diagnosis not present

## 2017-10-29 DIAGNOSIS — N2581 Secondary hyperparathyroidism of renal origin: Secondary | ICD-10-CM | POA: Diagnosis not present

## 2017-10-29 DIAGNOSIS — D509 Iron deficiency anemia, unspecified: Secondary | ICD-10-CM | POA: Diagnosis not present

## 2017-10-29 DIAGNOSIS — Z992 Dependence on renal dialysis: Secondary | ICD-10-CM | POA: Diagnosis not present

## 2017-10-29 DIAGNOSIS — D631 Anemia in chronic kidney disease: Secondary | ICD-10-CM | POA: Diagnosis not present

## 2017-10-29 DIAGNOSIS — N186 End stage renal disease: Secondary | ICD-10-CM | POA: Diagnosis not present

## 2017-10-30 DIAGNOSIS — D509 Iron deficiency anemia, unspecified: Secondary | ICD-10-CM | POA: Diagnosis not present

## 2017-10-30 DIAGNOSIS — Z992 Dependence on renal dialysis: Secondary | ICD-10-CM | POA: Diagnosis not present

## 2017-10-30 DIAGNOSIS — N186 End stage renal disease: Secondary | ICD-10-CM | POA: Diagnosis not present

## 2017-10-30 DIAGNOSIS — D631 Anemia in chronic kidney disease: Secondary | ICD-10-CM | POA: Diagnosis not present

## 2017-10-30 DIAGNOSIS — N2581 Secondary hyperparathyroidism of renal origin: Secondary | ICD-10-CM | POA: Diagnosis not present

## 2017-10-31 DIAGNOSIS — N186 End stage renal disease: Secondary | ICD-10-CM | POA: Diagnosis not present

## 2017-10-31 DIAGNOSIS — N2581 Secondary hyperparathyroidism of renal origin: Secondary | ICD-10-CM | POA: Diagnosis not present

## 2017-10-31 DIAGNOSIS — D631 Anemia in chronic kidney disease: Secondary | ICD-10-CM | POA: Diagnosis not present

## 2017-10-31 DIAGNOSIS — D509 Iron deficiency anemia, unspecified: Secondary | ICD-10-CM | POA: Diagnosis not present

## 2017-10-31 DIAGNOSIS — Z992 Dependence on renal dialysis: Secondary | ICD-10-CM | POA: Diagnosis not present

## 2017-11-01 DIAGNOSIS — D509 Iron deficiency anemia, unspecified: Secondary | ICD-10-CM | POA: Diagnosis not present

## 2017-11-01 DIAGNOSIS — Z992 Dependence on renal dialysis: Secondary | ICD-10-CM | POA: Diagnosis not present

## 2017-11-01 DIAGNOSIS — N186 End stage renal disease: Secondary | ICD-10-CM | POA: Diagnosis not present

## 2017-11-01 DIAGNOSIS — D631 Anemia in chronic kidney disease: Secondary | ICD-10-CM | POA: Diagnosis not present

## 2017-11-01 DIAGNOSIS — N2581 Secondary hyperparathyroidism of renal origin: Secondary | ICD-10-CM | POA: Diagnosis not present

## 2017-11-02 DIAGNOSIS — Z992 Dependence on renal dialysis: Secondary | ICD-10-CM | POA: Diagnosis not present

## 2017-11-02 DIAGNOSIS — D631 Anemia in chronic kidney disease: Secondary | ICD-10-CM | POA: Diagnosis not present

## 2017-11-02 DIAGNOSIS — N186 End stage renal disease: Secondary | ICD-10-CM | POA: Diagnosis not present

## 2017-11-02 DIAGNOSIS — D509 Iron deficiency anemia, unspecified: Secondary | ICD-10-CM | POA: Diagnosis not present

## 2017-11-02 DIAGNOSIS — N2581 Secondary hyperparathyroidism of renal origin: Secondary | ICD-10-CM | POA: Diagnosis not present

## 2017-11-03 DIAGNOSIS — Z992 Dependence on renal dialysis: Secondary | ICD-10-CM | POA: Diagnosis not present

## 2017-11-03 DIAGNOSIS — N2581 Secondary hyperparathyroidism of renal origin: Secondary | ICD-10-CM | POA: Diagnosis not present

## 2017-11-03 DIAGNOSIS — D509 Iron deficiency anemia, unspecified: Secondary | ICD-10-CM | POA: Diagnosis not present

## 2017-11-03 DIAGNOSIS — N186 End stage renal disease: Secondary | ICD-10-CM | POA: Diagnosis not present

## 2017-11-03 DIAGNOSIS — D631 Anemia in chronic kidney disease: Secondary | ICD-10-CM | POA: Diagnosis not present

## 2017-11-04 DIAGNOSIS — D509 Iron deficiency anemia, unspecified: Secondary | ICD-10-CM | POA: Diagnosis not present

## 2017-11-04 DIAGNOSIS — Z79899 Other long term (current) drug therapy: Secondary | ICD-10-CM | POA: Diagnosis not present

## 2017-11-04 DIAGNOSIS — I120 Hypertensive chronic kidney disease with stage 5 chronic kidney disease or end stage renal disease: Secondary | ICD-10-CM | POA: Diagnosis not present

## 2017-11-04 DIAGNOSIS — D631 Anemia in chronic kidney disease: Secondary | ICD-10-CM | POA: Diagnosis not present

## 2017-11-04 DIAGNOSIS — Z992 Dependence on renal dialysis: Secondary | ICD-10-CM | POA: Diagnosis not present

## 2017-11-04 DIAGNOSIS — N2581 Secondary hyperparathyroidism of renal origin: Secondary | ICD-10-CM | POA: Diagnosis not present

## 2017-11-04 DIAGNOSIS — R079 Chest pain, unspecified: Secondary | ICD-10-CM | POA: Diagnosis not present

## 2017-11-04 DIAGNOSIS — N186 End stage renal disease: Secondary | ICD-10-CM | POA: Diagnosis not present

## 2017-11-04 DIAGNOSIS — R0789 Other chest pain: Secondary | ICD-10-CM | POA: Diagnosis not present

## 2017-11-04 DIAGNOSIS — M1990 Unspecified osteoarthritis, unspecified site: Secondary | ICD-10-CM | POA: Diagnosis not present

## 2017-11-04 DIAGNOSIS — R791 Abnormal coagulation profile: Secondary | ICD-10-CM | POA: Diagnosis not present

## 2017-11-04 DIAGNOSIS — K219 Gastro-esophageal reflux disease without esophagitis: Secondary | ICD-10-CM | POA: Diagnosis not present

## 2017-11-05 DIAGNOSIS — N186 End stage renal disease: Secondary | ICD-10-CM | POA: Diagnosis not present

## 2017-11-05 DIAGNOSIS — D509 Iron deficiency anemia, unspecified: Secondary | ICD-10-CM | POA: Diagnosis not present

## 2017-11-05 DIAGNOSIS — D631 Anemia in chronic kidney disease: Secondary | ICD-10-CM | POA: Diagnosis not present

## 2017-11-05 DIAGNOSIS — Z992 Dependence on renal dialysis: Secondary | ICD-10-CM | POA: Diagnosis not present

## 2017-11-05 DIAGNOSIS — N2581 Secondary hyperparathyroidism of renal origin: Secondary | ICD-10-CM | POA: Diagnosis not present

## 2017-11-06 DIAGNOSIS — D631 Anemia in chronic kidney disease: Secondary | ICD-10-CM | POA: Diagnosis not present

## 2017-11-06 DIAGNOSIS — Z992 Dependence on renal dialysis: Secondary | ICD-10-CM | POA: Diagnosis not present

## 2017-11-06 DIAGNOSIS — N186 End stage renal disease: Secondary | ICD-10-CM | POA: Diagnosis not present

## 2017-11-06 DIAGNOSIS — N2581 Secondary hyperparathyroidism of renal origin: Secondary | ICD-10-CM | POA: Diagnosis not present

## 2017-11-06 DIAGNOSIS — D509 Iron deficiency anemia, unspecified: Secondary | ICD-10-CM | POA: Diagnosis not present

## 2017-11-07 DIAGNOSIS — N2581 Secondary hyperparathyroidism of renal origin: Secondary | ICD-10-CM | POA: Diagnosis not present

## 2017-11-07 DIAGNOSIS — D509 Iron deficiency anemia, unspecified: Secondary | ICD-10-CM | POA: Diagnosis not present

## 2017-11-07 DIAGNOSIS — D631 Anemia in chronic kidney disease: Secondary | ICD-10-CM | POA: Diagnosis not present

## 2017-11-07 DIAGNOSIS — Z992 Dependence on renal dialysis: Secondary | ICD-10-CM | POA: Diagnosis not present

## 2017-11-07 DIAGNOSIS — N186 End stage renal disease: Secondary | ICD-10-CM | POA: Diagnosis not present

## 2017-11-08 DIAGNOSIS — D631 Anemia in chronic kidney disease: Secondary | ICD-10-CM | POA: Diagnosis not present

## 2017-11-08 DIAGNOSIS — D509 Iron deficiency anemia, unspecified: Secondary | ICD-10-CM | POA: Diagnosis not present

## 2017-11-08 DIAGNOSIS — N186 End stage renal disease: Secondary | ICD-10-CM | POA: Diagnosis not present

## 2017-11-08 DIAGNOSIS — Z992 Dependence on renal dialysis: Secondary | ICD-10-CM | POA: Diagnosis not present

## 2017-11-08 DIAGNOSIS — N2581 Secondary hyperparathyroidism of renal origin: Secondary | ICD-10-CM | POA: Diagnosis not present

## 2017-11-09 DIAGNOSIS — N186 End stage renal disease: Secondary | ICD-10-CM | POA: Diagnosis not present

## 2017-11-09 DIAGNOSIS — Z992 Dependence on renal dialysis: Secondary | ICD-10-CM | POA: Diagnosis not present

## 2017-11-09 DIAGNOSIS — N2581 Secondary hyperparathyroidism of renal origin: Secondary | ICD-10-CM | POA: Diagnosis not present

## 2017-11-09 DIAGNOSIS — D509 Iron deficiency anemia, unspecified: Secondary | ICD-10-CM | POA: Diagnosis not present

## 2017-11-09 DIAGNOSIS — D631 Anemia in chronic kidney disease: Secondary | ICD-10-CM | POA: Diagnosis not present

## 2017-11-10 DIAGNOSIS — Z992 Dependence on renal dialysis: Secondary | ICD-10-CM | POA: Diagnosis not present

## 2017-11-10 DIAGNOSIS — I2699 Other pulmonary embolism without acute cor pulmonale: Secondary | ICD-10-CM | POA: Diagnosis not present

## 2017-11-10 DIAGNOSIS — Z5181 Encounter for therapeutic drug level monitoring: Secondary | ICD-10-CM | POA: Diagnosis not present

## 2017-11-10 DIAGNOSIS — D631 Anemia in chronic kidney disease: Secondary | ICD-10-CM | POA: Diagnosis not present

## 2017-11-10 DIAGNOSIS — Z1331 Encounter for screening for depression: Secondary | ICD-10-CM | POA: Diagnosis not present

## 2017-11-10 DIAGNOSIS — Z7901 Long term (current) use of anticoagulants: Secondary | ICD-10-CM | POA: Diagnosis not present

## 2017-11-10 DIAGNOSIS — N186 End stage renal disease: Secondary | ICD-10-CM | POA: Diagnosis not present

## 2017-11-10 DIAGNOSIS — D509 Iron deficiency anemia, unspecified: Secondary | ICD-10-CM | POA: Diagnosis not present

## 2017-11-10 DIAGNOSIS — N2581 Secondary hyperparathyroidism of renal origin: Secondary | ICD-10-CM | POA: Diagnosis not present

## 2017-11-11 DIAGNOSIS — Z992 Dependence on renal dialysis: Secondary | ICD-10-CM | POA: Diagnosis not present

## 2017-11-11 DIAGNOSIS — N2581 Secondary hyperparathyroidism of renal origin: Secondary | ICD-10-CM | POA: Diagnosis not present

## 2017-11-11 DIAGNOSIS — N186 End stage renal disease: Secondary | ICD-10-CM | POA: Diagnosis not present

## 2017-11-11 DIAGNOSIS — D631 Anemia in chronic kidney disease: Secondary | ICD-10-CM | POA: Diagnosis not present

## 2017-11-11 DIAGNOSIS — D509 Iron deficiency anemia, unspecified: Secondary | ICD-10-CM | POA: Diagnosis not present

## 2017-11-12 DIAGNOSIS — N186 End stage renal disease: Secondary | ICD-10-CM | POA: Diagnosis not present

## 2017-11-12 DIAGNOSIS — D631 Anemia in chronic kidney disease: Secondary | ICD-10-CM | POA: Diagnosis not present

## 2017-11-12 DIAGNOSIS — Z992 Dependence on renal dialysis: Secondary | ICD-10-CM | POA: Diagnosis not present

## 2017-11-12 DIAGNOSIS — D509 Iron deficiency anemia, unspecified: Secondary | ICD-10-CM | POA: Diagnosis not present

## 2017-11-12 DIAGNOSIS — N2581 Secondary hyperparathyroidism of renal origin: Secondary | ICD-10-CM | POA: Diagnosis not present

## 2017-11-13 DIAGNOSIS — D631 Anemia in chronic kidney disease: Secondary | ICD-10-CM | POA: Diagnosis not present

## 2017-11-13 DIAGNOSIS — D509 Iron deficiency anemia, unspecified: Secondary | ICD-10-CM | POA: Diagnosis not present

## 2017-11-13 DIAGNOSIS — N186 End stage renal disease: Secondary | ICD-10-CM | POA: Diagnosis not present

## 2017-11-13 DIAGNOSIS — N2581 Secondary hyperparathyroidism of renal origin: Secondary | ICD-10-CM | POA: Diagnosis not present

## 2017-11-13 DIAGNOSIS — Z992 Dependence on renal dialysis: Secondary | ICD-10-CM | POA: Diagnosis not present

## 2017-11-14 DIAGNOSIS — N186 End stage renal disease: Secondary | ICD-10-CM | POA: Diagnosis not present

## 2017-11-14 DIAGNOSIS — Z992 Dependence on renal dialysis: Secondary | ICD-10-CM | POA: Diagnosis not present

## 2017-11-14 DIAGNOSIS — D631 Anemia in chronic kidney disease: Secondary | ICD-10-CM | POA: Diagnosis not present

## 2017-11-14 DIAGNOSIS — D509 Iron deficiency anemia, unspecified: Secondary | ICD-10-CM | POA: Diagnosis not present

## 2017-11-14 DIAGNOSIS — N2581 Secondary hyperparathyroidism of renal origin: Secondary | ICD-10-CM | POA: Diagnosis not present

## 2017-11-15 DIAGNOSIS — N2581 Secondary hyperparathyroidism of renal origin: Secondary | ICD-10-CM | POA: Diagnosis not present

## 2017-11-15 DIAGNOSIS — N186 End stage renal disease: Secondary | ICD-10-CM | POA: Diagnosis not present

## 2017-11-15 DIAGNOSIS — D509 Iron deficiency anemia, unspecified: Secondary | ICD-10-CM | POA: Diagnosis not present

## 2017-11-15 DIAGNOSIS — D631 Anemia in chronic kidney disease: Secondary | ICD-10-CM | POA: Diagnosis not present

## 2017-11-15 DIAGNOSIS — Z992 Dependence on renal dialysis: Secondary | ICD-10-CM | POA: Diagnosis not present

## 2017-11-16 DIAGNOSIS — N186 End stage renal disease: Secondary | ICD-10-CM | POA: Diagnosis not present

## 2017-11-16 DIAGNOSIS — D631 Anemia in chronic kidney disease: Secondary | ICD-10-CM | POA: Diagnosis not present

## 2017-11-16 DIAGNOSIS — Z992 Dependence on renal dialysis: Secondary | ICD-10-CM | POA: Diagnosis not present

## 2017-11-16 DIAGNOSIS — N2581 Secondary hyperparathyroidism of renal origin: Secondary | ICD-10-CM | POA: Diagnosis not present

## 2017-11-16 DIAGNOSIS — D509 Iron deficiency anemia, unspecified: Secondary | ICD-10-CM | POA: Diagnosis not present

## 2017-11-17 DIAGNOSIS — N2581 Secondary hyperparathyroidism of renal origin: Secondary | ICD-10-CM | POA: Diagnosis not present

## 2017-11-17 DIAGNOSIS — N186 End stage renal disease: Secondary | ICD-10-CM | POA: Diagnosis not present

## 2017-11-17 DIAGNOSIS — D631 Anemia in chronic kidney disease: Secondary | ICD-10-CM | POA: Diagnosis not present

## 2017-11-17 DIAGNOSIS — D509 Iron deficiency anemia, unspecified: Secondary | ICD-10-CM | POA: Diagnosis not present

## 2017-11-17 DIAGNOSIS — Z992 Dependence on renal dialysis: Secondary | ICD-10-CM | POA: Diagnosis not present

## 2017-11-18 DIAGNOSIS — D509 Iron deficiency anemia, unspecified: Secondary | ICD-10-CM | POA: Diagnosis not present

## 2017-11-18 DIAGNOSIS — N186 End stage renal disease: Secondary | ICD-10-CM | POA: Diagnosis not present

## 2017-11-18 DIAGNOSIS — Z992 Dependence on renal dialysis: Secondary | ICD-10-CM | POA: Diagnosis not present

## 2017-11-18 DIAGNOSIS — N2581 Secondary hyperparathyroidism of renal origin: Secondary | ICD-10-CM | POA: Diagnosis not present

## 2017-11-18 DIAGNOSIS — D631 Anemia in chronic kidney disease: Secondary | ICD-10-CM | POA: Diagnosis not present

## 2017-11-19 DIAGNOSIS — Z992 Dependence on renal dialysis: Secondary | ICD-10-CM | POA: Diagnosis not present

## 2017-11-19 DIAGNOSIS — D631 Anemia in chronic kidney disease: Secondary | ICD-10-CM | POA: Diagnosis not present

## 2017-11-19 DIAGNOSIS — N186 End stage renal disease: Secondary | ICD-10-CM | POA: Diagnosis not present

## 2017-11-19 DIAGNOSIS — D509 Iron deficiency anemia, unspecified: Secondary | ICD-10-CM | POA: Diagnosis not present

## 2017-11-19 DIAGNOSIS — N2581 Secondary hyperparathyroidism of renal origin: Secondary | ICD-10-CM | POA: Diagnosis not present

## 2017-11-20 DIAGNOSIS — Z992 Dependence on renal dialysis: Secondary | ICD-10-CM | POA: Diagnosis not present

## 2017-11-20 DIAGNOSIS — D509 Iron deficiency anemia, unspecified: Secondary | ICD-10-CM | POA: Diagnosis not present

## 2017-11-20 DIAGNOSIS — D631 Anemia in chronic kidney disease: Secondary | ICD-10-CM | POA: Diagnosis not present

## 2017-11-20 DIAGNOSIS — N186 End stage renal disease: Secondary | ICD-10-CM | POA: Diagnosis not present

## 2017-11-20 DIAGNOSIS — N2581 Secondary hyperparathyroidism of renal origin: Secondary | ICD-10-CM | POA: Diagnosis not present

## 2017-11-21 DIAGNOSIS — N2581 Secondary hyperparathyroidism of renal origin: Secondary | ICD-10-CM | POA: Diagnosis not present

## 2017-11-21 DIAGNOSIS — Z992 Dependence on renal dialysis: Secondary | ICD-10-CM | POA: Diagnosis not present

## 2017-11-21 DIAGNOSIS — D509 Iron deficiency anemia, unspecified: Secondary | ICD-10-CM | POA: Diagnosis not present

## 2017-11-21 DIAGNOSIS — D631 Anemia in chronic kidney disease: Secondary | ICD-10-CM | POA: Diagnosis not present

## 2017-11-21 DIAGNOSIS — N186 End stage renal disease: Secondary | ICD-10-CM | POA: Diagnosis not present

## 2017-11-22 DIAGNOSIS — Z992 Dependence on renal dialysis: Secondary | ICD-10-CM | POA: Diagnosis not present

## 2017-11-22 DIAGNOSIS — D509 Iron deficiency anemia, unspecified: Secondary | ICD-10-CM | POA: Diagnosis not present

## 2017-11-22 DIAGNOSIS — N186 End stage renal disease: Secondary | ICD-10-CM | POA: Diagnosis not present

## 2017-11-22 DIAGNOSIS — D631 Anemia in chronic kidney disease: Secondary | ICD-10-CM | POA: Diagnosis not present

## 2017-11-22 DIAGNOSIS — N2581 Secondary hyperparathyroidism of renal origin: Secondary | ICD-10-CM | POA: Diagnosis not present

## 2017-11-23 DIAGNOSIS — N2581 Secondary hyperparathyroidism of renal origin: Secondary | ICD-10-CM | POA: Diagnosis not present

## 2017-11-23 DIAGNOSIS — Z992 Dependence on renal dialysis: Secondary | ICD-10-CM | POA: Diagnosis not present

## 2017-11-23 DIAGNOSIS — D631 Anemia in chronic kidney disease: Secondary | ICD-10-CM | POA: Diagnosis not present

## 2017-11-23 DIAGNOSIS — D509 Iron deficiency anemia, unspecified: Secondary | ICD-10-CM | POA: Diagnosis not present

## 2017-11-23 DIAGNOSIS — N186 End stage renal disease: Secondary | ICD-10-CM | POA: Diagnosis not present

## 2017-11-24 DIAGNOSIS — N186 End stage renal disease: Secondary | ICD-10-CM | POA: Diagnosis not present

## 2017-11-24 DIAGNOSIS — Z992 Dependence on renal dialysis: Secondary | ICD-10-CM | POA: Diagnosis not present

## 2017-11-24 DIAGNOSIS — N2581 Secondary hyperparathyroidism of renal origin: Secondary | ICD-10-CM | POA: Diagnosis not present

## 2017-11-24 DIAGNOSIS — D631 Anemia in chronic kidney disease: Secondary | ICD-10-CM | POA: Diagnosis not present

## 2017-11-24 DIAGNOSIS — D509 Iron deficiency anemia, unspecified: Secondary | ICD-10-CM | POA: Diagnosis not present

## 2017-11-25 DIAGNOSIS — N186 End stage renal disease: Secondary | ICD-10-CM | POA: Diagnosis not present

## 2017-11-25 DIAGNOSIS — N2581 Secondary hyperparathyroidism of renal origin: Secondary | ICD-10-CM | POA: Diagnosis not present

## 2017-11-25 DIAGNOSIS — Z992 Dependence on renal dialysis: Secondary | ICD-10-CM | POA: Diagnosis not present

## 2017-11-25 DIAGNOSIS — D509 Iron deficiency anemia, unspecified: Secondary | ICD-10-CM | POA: Diagnosis not present

## 2017-11-25 DIAGNOSIS — D631 Anemia in chronic kidney disease: Secondary | ICD-10-CM | POA: Diagnosis not present

## 2017-11-26 DIAGNOSIS — D631 Anemia in chronic kidney disease: Secondary | ICD-10-CM | POA: Diagnosis not present

## 2017-11-26 DIAGNOSIS — N2581 Secondary hyperparathyroidism of renal origin: Secondary | ICD-10-CM | POA: Diagnosis not present

## 2017-11-26 DIAGNOSIS — D509 Iron deficiency anemia, unspecified: Secondary | ICD-10-CM | POA: Diagnosis not present

## 2017-11-26 DIAGNOSIS — N186 End stage renal disease: Secondary | ICD-10-CM | POA: Diagnosis not present

## 2017-11-26 DIAGNOSIS — Z992 Dependence on renal dialysis: Secondary | ICD-10-CM | POA: Diagnosis not present

## 2017-11-27 DIAGNOSIS — Z992 Dependence on renal dialysis: Secondary | ICD-10-CM | POA: Diagnosis not present

## 2017-11-27 DIAGNOSIS — D631 Anemia in chronic kidney disease: Secondary | ICD-10-CM | POA: Diagnosis not present

## 2017-11-27 DIAGNOSIS — N2581 Secondary hyperparathyroidism of renal origin: Secondary | ICD-10-CM | POA: Diagnosis not present

## 2017-11-27 DIAGNOSIS — N186 End stage renal disease: Secondary | ICD-10-CM | POA: Diagnosis not present

## 2017-11-27 DIAGNOSIS — D509 Iron deficiency anemia, unspecified: Secondary | ICD-10-CM | POA: Diagnosis not present

## 2017-11-28 DIAGNOSIS — N2581 Secondary hyperparathyroidism of renal origin: Secondary | ICD-10-CM | POA: Diagnosis not present

## 2017-11-28 DIAGNOSIS — N186 End stage renal disease: Secondary | ICD-10-CM | POA: Diagnosis not present

## 2017-11-28 DIAGNOSIS — Z992 Dependence on renal dialysis: Secondary | ICD-10-CM | POA: Diagnosis not present

## 2017-11-28 DIAGNOSIS — D631 Anemia in chronic kidney disease: Secondary | ICD-10-CM | POA: Diagnosis not present

## 2017-11-29 DIAGNOSIS — N2581 Secondary hyperparathyroidism of renal origin: Secondary | ICD-10-CM | POA: Diagnosis not present

## 2017-11-29 DIAGNOSIS — N186 End stage renal disease: Secondary | ICD-10-CM | POA: Diagnosis not present

## 2017-11-29 DIAGNOSIS — D631 Anemia in chronic kidney disease: Secondary | ICD-10-CM | POA: Diagnosis not present

## 2017-11-29 DIAGNOSIS — Z992 Dependence on renal dialysis: Secondary | ICD-10-CM | POA: Diagnosis not present

## 2017-11-30 DIAGNOSIS — Z992 Dependence on renal dialysis: Secondary | ICD-10-CM | POA: Diagnosis not present

## 2017-11-30 DIAGNOSIS — D631 Anemia in chronic kidney disease: Secondary | ICD-10-CM | POA: Diagnosis not present

## 2017-11-30 DIAGNOSIS — N186 End stage renal disease: Secondary | ICD-10-CM | POA: Diagnosis not present

## 2017-11-30 DIAGNOSIS — N2581 Secondary hyperparathyroidism of renal origin: Secondary | ICD-10-CM | POA: Diagnosis not present

## 2017-12-01 DIAGNOSIS — N2581 Secondary hyperparathyroidism of renal origin: Secondary | ICD-10-CM | POA: Diagnosis not present

## 2017-12-01 DIAGNOSIS — N186 End stage renal disease: Secondary | ICD-10-CM | POA: Diagnosis not present

## 2017-12-01 DIAGNOSIS — D631 Anemia in chronic kidney disease: Secondary | ICD-10-CM | POA: Diagnosis not present

## 2017-12-01 DIAGNOSIS — Z992 Dependence on renal dialysis: Secondary | ICD-10-CM | POA: Diagnosis not present

## 2017-12-02 DIAGNOSIS — N2581 Secondary hyperparathyroidism of renal origin: Secondary | ICD-10-CM | POA: Diagnosis not present

## 2017-12-02 DIAGNOSIS — N186 End stage renal disease: Secondary | ICD-10-CM | POA: Diagnosis not present

## 2017-12-02 DIAGNOSIS — Z992 Dependence on renal dialysis: Secondary | ICD-10-CM | POA: Diagnosis not present

## 2017-12-02 DIAGNOSIS — D631 Anemia in chronic kidney disease: Secondary | ICD-10-CM | POA: Diagnosis not present

## 2017-12-03 DIAGNOSIS — D631 Anemia in chronic kidney disease: Secondary | ICD-10-CM | POA: Diagnosis not present

## 2017-12-03 DIAGNOSIS — Z992 Dependence on renal dialysis: Secondary | ICD-10-CM | POA: Diagnosis not present

## 2017-12-03 DIAGNOSIS — N186 End stage renal disease: Secondary | ICD-10-CM | POA: Diagnosis not present

## 2017-12-03 DIAGNOSIS — N2581 Secondary hyperparathyroidism of renal origin: Secondary | ICD-10-CM | POA: Diagnosis not present

## 2017-12-04 DIAGNOSIS — N2581 Secondary hyperparathyroidism of renal origin: Secondary | ICD-10-CM | POA: Diagnosis not present

## 2017-12-04 DIAGNOSIS — Z992 Dependence on renal dialysis: Secondary | ICD-10-CM | POA: Diagnosis not present

## 2017-12-04 DIAGNOSIS — D631 Anemia in chronic kidney disease: Secondary | ICD-10-CM | POA: Diagnosis not present

## 2017-12-04 DIAGNOSIS — N186 End stage renal disease: Secondary | ICD-10-CM | POA: Diagnosis not present

## 2017-12-05 DIAGNOSIS — N2581 Secondary hyperparathyroidism of renal origin: Secondary | ICD-10-CM | POA: Diagnosis not present

## 2017-12-05 DIAGNOSIS — D631 Anemia in chronic kidney disease: Secondary | ICD-10-CM | POA: Diagnosis not present

## 2017-12-05 DIAGNOSIS — Z992 Dependence on renal dialysis: Secondary | ICD-10-CM | POA: Diagnosis not present

## 2017-12-05 DIAGNOSIS — N186 End stage renal disease: Secondary | ICD-10-CM | POA: Diagnosis not present

## 2017-12-06 DIAGNOSIS — Z992 Dependence on renal dialysis: Secondary | ICD-10-CM | POA: Diagnosis not present

## 2017-12-06 DIAGNOSIS — D631 Anemia in chronic kidney disease: Secondary | ICD-10-CM | POA: Diagnosis not present

## 2017-12-06 DIAGNOSIS — N186 End stage renal disease: Secondary | ICD-10-CM | POA: Diagnosis not present

## 2017-12-06 DIAGNOSIS — N2581 Secondary hyperparathyroidism of renal origin: Secondary | ICD-10-CM | POA: Diagnosis not present

## 2017-12-07 DIAGNOSIS — D631 Anemia in chronic kidney disease: Secondary | ICD-10-CM | POA: Diagnosis not present

## 2017-12-07 DIAGNOSIS — N2581 Secondary hyperparathyroidism of renal origin: Secondary | ICD-10-CM | POA: Diagnosis not present

## 2017-12-07 DIAGNOSIS — N186 End stage renal disease: Secondary | ICD-10-CM | POA: Diagnosis not present

## 2017-12-07 DIAGNOSIS — Z992 Dependence on renal dialysis: Secondary | ICD-10-CM | POA: Diagnosis not present

## 2017-12-08 DIAGNOSIS — D631 Anemia in chronic kidney disease: Secondary | ICD-10-CM | POA: Diagnosis not present

## 2017-12-08 DIAGNOSIS — N2581 Secondary hyperparathyroidism of renal origin: Secondary | ICD-10-CM | POA: Diagnosis not present

## 2017-12-08 DIAGNOSIS — Z992 Dependence on renal dialysis: Secondary | ICD-10-CM | POA: Diagnosis not present

## 2017-12-08 DIAGNOSIS — N186 End stage renal disease: Secondary | ICD-10-CM | POA: Diagnosis not present

## 2017-12-09 DIAGNOSIS — D631 Anemia in chronic kidney disease: Secondary | ICD-10-CM | POA: Diagnosis not present

## 2017-12-09 DIAGNOSIS — N186 End stage renal disease: Secondary | ICD-10-CM | POA: Diagnosis not present

## 2017-12-09 DIAGNOSIS — Z0000 Encounter for general adult medical examination without abnormal findings: Secondary | ICD-10-CM | POA: Diagnosis not present

## 2017-12-09 DIAGNOSIS — I2699 Other pulmonary embolism without acute cor pulmonale: Secondary | ICD-10-CM | POA: Diagnosis not present

## 2017-12-09 DIAGNOSIS — N2581 Secondary hyperparathyroidism of renal origin: Secondary | ICD-10-CM | POA: Diagnosis not present

## 2017-12-09 DIAGNOSIS — Z992 Dependence on renal dialysis: Secondary | ICD-10-CM | POA: Diagnosis not present

## 2017-12-09 DIAGNOSIS — Z1331 Encounter for screening for depression: Secondary | ICD-10-CM | POA: Diagnosis not present

## 2017-12-09 DIAGNOSIS — Z5181 Encounter for therapeutic drug level monitoring: Secondary | ICD-10-CM | POA: Diagnosis not present

## 2017-12-09 DIAGNOSIS — Z1211 Encounter for screening for malignant neoplasm of colon: Secondary | ICD-10-CM | POA: Diagnosis not present

## 2017-12-09 DIAGNOSIS — Z139 Encounter for screening, unspecified: Secondary | ICD-10-CM | POA: Diagnosis not present

## 2017-12-09 DIAGNOSIS — R195 Other fecal abnormalities: Secondary | ICD-10-CM | POA: Diagnosis not present

## 2017-12-09 DIAGNOSIS — Z01419 Encounter for gynecological examination (general) (routine) without abnormal findings: Secondary | ICD-10-CM | POA: Diagnosis not present

## 2017-12-10 DIAGNOSIS — Z992 Dependence on renal dialysis: Secondary | ICD-10-CM | POA: Diagnosis not present

## 2017-12-10 DIAGNOSIS — N186 End stage renal disease: Secondary | ICD-10-CM | POA: Diagnosis not present

## 2017-12-10 DIAGNOSIS — N2581 Secondary hyperparathyroidism of renal origin: Secondary | ICD-10-CM | POA: Diagnosis not present

## 2017-12-10 DIAGNOSIS — D631 Anemia in chronic kidney disease: Secondary | ICD-10-CM | POA: Diagnosis not present

## 2017-12-11 DIAGNOSIS — N186 End stage renal disease: Secondary | ICD-10-CM | POA: Diagnosis not present

## 2017-12-11 DIAGNOSIS — N2581 Secondary hyperparathyroidism of renal origin: Secondary | ICD-10-CM | POA: Diagnosis not present

## 2017-12-11 DIAGNOSIS — D631 Anemia in chronic kidney disease: Secondary | ICD-10-CM | POA: Diagnosis not present

## 2017-12-11 DIAGNOSIS — Z992 Dependence on renal dialysis: Secondary | ICD-10-CM | POA: Diagnosis not present

## 2017-12-12 DIAGNOSIS — D631 Anemia in chronic kidney disease: Secondary | ICD-10-CM | POA: Diagnosis not present

## 2017-12-12 DIAGNOSIS — N186 End stage renal disease: Secondary | ICD-10-CM | POA: Diagnosis not present

## 2017-12-12 DIAGNOSIS — N2581 Secondary hyperparathyroidism of renal origin: Secondary | ICD-10-CM | POA: Diagnosis not present

## 2017-12-12 DIAGNOSIS — Z992 Dependence on renal dialysis: Secondary | ICD-10-CM | POA: Diagnosis not present

## 2017-12-13 DIAGNOSIS — N2581 Secondary hyperparathyroidism of renal origin: Secondary | ICD-10-CM | POA: Diagnosis not present

## 2017-12-13 DIAGNOSIS — D631 Anemia in chronic kidney disease: Secondary | ICD-10-CM | POA: Diagnosis not present

## 2017-12-13 DIAGNOSIS — N186 End stage renal disease: Secondary | ICD-10-CM | POA: Diagnosis not present

## 2017-12-13 DIAGNOSIS — Z992 Dependence on renal dialysis: Secondary | ICD-10-CM | POA: Diagnosis not present

## 2017-12-14 DIAGNOSIS — N2581 Secondary hyperparathyroidism of renal origin: Secondary | ICD-10-CM | POA: Diagnosis not present

## 2017-12-14 DIAGNOSIS — D631 Anemia in chronic kidney disease: Secondary | ICD-10-CM | POA: Diagnosis not present

## 2017-12-14 DIAGNOSIS — N186 End stage renal disease: Secondary | ICD-10-CM | POA: Diagnosis not present

## 2017-12-14 DIAGNOSIS — Z992 Dependence on renal dialysis: Secondary | ICD-10-CM | POA: Diagnosis not present

## 2017-12-15 DIAGNOSIS — I2699 Other pulmonary embolism without acute cor pulmonale: Secondary | ICD-10-CM | POA: Diagnosis not present

## 2017-12-15 DIAGNOSIS — Z992 Dependence on renal dialysis: Secondary | ICD-10-CM | POA: Diagnosis not present

## 2017-12-15 DIAGNOSIS — D631 Anemia in chronic kidney disease: Secondary | ICD-10-CM | POA: Diagnosis not present

## 2017-12-15 DIAGNOSIS — N186 End stage renal disease: Secondary | ICD-10-CM | POA: Diagnosis not present

## 2017-12-15 DIAGNOSIS — Z5181 Encounter for therapeutic drug level monitoring: Secondary | ICD-10-CM | POA: Diagnosis not present

## 2017-12-15 DIAGNOSIS — J393 Upper respiratory tract hypersensitivity reaction, site unspecified: Secondary | ICD-10-CM | POA: Diagnosis not present

## 2017-12-15 DIAGNOSIS — N2581 Secondary hyperparathyroidism of renal origin: Secondary | ICD-10-CM | POA: Diagnosis not present

## 2017-12-16 DIAGNOSIS — Z992 Dependence on renal dialysis: Secondary | ICD-10-CM | POA: Diagnosis not present

## 2017-12-16 DIAGNOSIS — N2581 Secondary hyperparathyroidism of renal origin: Secondary | ICD-10-CM | POA: Diagnosis not present

## 2017-12-16 DIAGNOSIS — N186 End stage renal disease: Secondary | ICD-10-CM | POA: Diagnosis not present

## 2017-12-16 DIAGNOSIS — D631 Anemia in chronic kidney disease: Secondary | ICD-10-CM | POA: Diagnosis not present

## 2017-12-17 DIAGNOSIS — N2581 Secondary hyperparathyroidism of renal origin: Secondary | ICD-10-CM | POA: Diagnosis not present

## 2017-12-17 DIAGNOSIS — N186 End stage renal disease: Secondary | ICD-10-CM | POA: Diagnosis not present

## 2017-12-17 DIAGNOSIS — Z992 Dependence on renal dialysis: Secondary | ICD-10-CM | POA: Diagnosis not present

## 2017-12-17 DIAGNOSIS — D631 Anemia in chronic kidney disease: Secondary | ICD-10-CM | POA: Diagnosis not present

## 2017-12-18 DIAGNOSIS — Z992 Dependence on renal dialysis: Secondary | ICD-10-CM | POA: Diagnosis not present

## 2017-12-18 DIAGNOSIS — N2581 Secondary hyperparathyroidism of renal origin: Secondary | ICD-10-CM | POA: Diagnosis not present

## 2017-12-18 DIAGNOSIS — D631 Anemia in chronic kidney disease: Secondary | ICD-10-CM | POA: Diagnosis not present

## 2017-12-18 DIAGNOSIS — N186 End stage renal disease: Secondary | ICD-10-CM | POA: Diagnosis not present

## 2017-12-19 DIAGNOSIS — N186 End stage renal disease: Secondary | ICD-10-CM | POA: Diagnosis not present

## 2017-12-19 DIAGNOSIS — N2581 Secondary hyperparathyroidism of renal origin: Secondary | ICD-10-CM | POA: Diagnosis not present

## 2017-12-19 DIAGNOSIS — Z992 Dependence on renal dialysis: Secondary | ICD-10-CM | POA: Diagnosis not present

## 2017-12-19 DIAGNOSIS — D631 Anemia in chronic kidney disease: Secondary | ICD-10-CM | POA: Diagnosis not present

## 2017-12-20 DIAGNOSIS — N2581 Secondary hyperparathyroidism of renal origin: Secondary | ICD-10-CM | POA: Diagnosis not present

## 2017-12-20 DIAGNOSIS — D631 Anemia in chronic kidney disease: Secondary | ICD-10-CM | POA: Diagnosis not present

## 2017-12-20 DIAGNOSIS — Z992 Dependence on renal dialysis: Secondary | ICD-10-CM | POA: Diagnosis not present

## 2017-12-20 DIAGNOSIS — N186 End stage renal disease: Secondary | ICD-10-CM | POA: Diagnosis not present

## 2017-12-21 DIAGNOSIS — D631 Anemia in chronic kidney disease: Secondary | ICD-10-CM | POA: Diagnosis not present

## 2017-12-21 DIAGNOSIS — N2581 Secondary hyperparathyroidism of renal origin: Secondary | ICD-10-CM | POA: Diagnosis not present

## 2017-12-21 DIAGNOSIS — Z992 Dependence on renal dialysis: Secondary | ICD-10-CM | POA: Diagnosis not present

## 2017-12-21 DIAGNOSIS — N186 End stage renal disease: Secondary | ICD-10-CM | POA: Diagnosis not present

## 2017-12-22 DIAGNOSIS — D631 Anemia in chronic kidney disease: Secondary | ICD-10-CM | POA: Diagnosis not present

## 2017-12-22 DIAGNOSIS — Z992 Dependence on renal dialysis: Secondary | ICD-10-CM | POA: Diagnosis not present

## 2017-12-22 DIAGNOSIS — Z7901 Long term (current) use of anticoagulants: Secondary | ICD-10-CM | POA: Diagnosis not present

## 2017-12-22 DIAGNOSIS — I2699 Other pulmonary embolism without acute cor pulmonale: Secondary | ICD-10-CM | POA: Diagnosis not present

## 2017-12-22 DIAGNOSIS — N2581 Secondary hyperparathyroidism of renal origin: Secondary | ICD-10-CM | POA: Diagnosis not present

## 2017-12-22 DIAGNOSIS — Z5181 Encounter for therapeutic drug level monitoring: Secondary | ICD-10-CM | POA: Diagnosis not present

## 2017-12-22 DIAGNOSIS — N186 End stage renal disease: Secondary | ICD-10-CM | POA: Diagnosis not present

## 2017-12-23 DIAGNOSIS — Z992 Dependence on renal dialysis: Secondary | ICD-10-CM | POA: Diagnosis not present

## 2017-12-23 DIAGNOSIS — N2581 Secondary hyperparathyroidism of renal origin: Secondary | ICD-10-CM | POA: Diagnosis not present

## 2017-12-23 DIAGNOSIS — D631 Anemia in chronic kidney disease: Secondary | ICD-10-CM | POA: Diagnosis not present

## 2017-12-23 DIAGNOSIS — N186 End stage renal disease: Secondary | ICD-10-CM | POA: Diagnosis not present

## 2017-12-24 DIAGNOSIS — N2581 Secondary hyperparathyroidism of renal origin: Secondary | ICD-10-CM | POA: Diagnosis not present

## 2017-12-24 DIAGNOSIS — Z992 Dependence on renal dialysis: Secondary | ICD-10-CM | POA: Diagnosis not present

## 2017-12-24 DIAGNOSIS — D631 Anemia in chronic kidney disease: Secondary | ICD-10-CM | POA: Diagnosis not present

## 2017-12-24 DIAGNOSIS — N186 End stage renal disease: Secondary | ICD-10-CM | POA: Diagnosis not present

## 2017-12-25 DIAGNOSIS — Z992 Dependence on renal dialysis: Secondary | ICD-10-CM | POA: Diagnosis not present

## 2017-12-25 DIAGNOSIS — D631 Anemia in chronic kidney disease: Secondary | ICD-10-CM | POA: Diagnosis not present

## 2017-12-25 DIAGNOSIS — N186 End stage renal disease: Secondary | ICD-10-CM | POA: Diagnosis not present

## 2017-12-25 DIAGNOSIS — N2581 Secondary hyperparathyroidism of renal origin: Secondary | ICD-10-CM | POA: Diagnosis not present

## 2017-12-26 DIAGNOSIS — Z992 Dependence on renal dialysis: Secondary | ICD-10-CM | POA: Diagnosis not present

## 2017-12-26 DIAGNOSIS — N186 End stage renal disease: Secondary | ICD-10-CM | POA: Diagnosis not present

## 2017-12-26 DIAGNOSIS — D631 Anemia in chronic kidney disease: Secondary | ICD-10-CM | POA: Diagnosis not present

## 2017-12-26 DIAGNOSIS — N2581 Secondary hyperparathyroidism of renal origin: Secondary | ICD-10-CM | POA: Diagnosis not present

## 2017-12-27 DIAGNOSIS — Z992 Dependence on renal dialysis: Secondary | ICD-10-CM | POA: Diagnosis not present

## 2017-12-27 DIAGNOSIS — D631 Anemia in chronic kidney disease: Secondary | ICD-10-CM | POA: Diagnosis not present

## 2017-12-27 DIAGNOSIS — N2581 Secondary hyperparathyroidism of renal origin: Secondary | ICD-10-CM | POA: Diagnosis not present

## 2017-12-27 DIAGNOSIS — N186 End stage renal disease: Secondary | ICD-10-CM | POA: Diagnosis not present

## 2017-12-28 DIAGNOSIS — Z992 Dependence on renal dialysis: Secondary | ICD-10-CM | POA: Diagnosis not present

## 2017-12-28 DIAGNOSIS — N2581 Secondary hyperparathyroidism of renal origin: Secondary | ICD-10-CM | POA: Diagnosis not present

## 2017-12-28 DIAGNOSIS — D631 Anemia in chronic kidney disease: Secondary | ICD-10-CM | POA: Diagnosis not present

## 2017-12-28 DIAGNOSIS — D509 Iron deficiency anemia, unspecified: Secondary | ICD-10-CM | POA: Diagnosis not present

## 2017-12-28 DIAGNOSIS — N186 End stage renal disease: Secondary | ICD-10-CM | POA: Diagnosis not present

## 2017-12-29 DIAGNOSIS — D509 Iron deficiency anemia, unspecified: Secondary | ICD-10-CM | POA: Diagnosis not present

## 2017-12-29 DIAGNOSIS — Z5181 Encounter for therapeutic drug level monitoring: Secondary | ICD-10-CM | POA: Diagnosis not present

## 2017-12-29 DIAGNOSIS — N2581 Secondary hyperparathyroidism of renal origin: Secondary | ICD-10-CM | POA: Diagnosis not present

## 2017-12-29 DIAGNOSIS — I2699 Other pulmonary embolism without acute cor pulmonale: Secondary | ICD-10-CM | POA: Diagnosis not present

## 2017-12-29 DIAGNOSIS — Z7901 Long term (current) use of anticoagulants: Secondary | ICD-10-CM | POA: Diagnosis not present

## 2017-12-29 DIAGNOSIS — N186 End stage renal disease: Secondary | ICD-10-CM | POA: Diagnosis not present

## 2017-12-29 DIAGNOSIS — D631 Anemia in chronic kidney disease: Secondary | ICD-10-CM | POA: Diagnosis not present

## 2017-12-29 DIAGNOSIS — Z992 Dependence on renal dialysis: Secondary | ICD-10-CM | POA: Diagnosis not present

## 2017-12-30 DIAGNOSIS — D631 Anemia in chronic kidney disease: Secondary | ICD-10-CM | POA: Diagnosis not present

## 2017-12-30 DIAGNOSIS — Z992 Dependence on renal dialysis: Secondary | ICD-10-CM | POA: Diagnosis not present

## 2017-12-30 DIAGNOSIS — N2581 Secondary hyperparathyroidism of renal origin: Secondary | ICD-10-CM | POA: Diagnosis not present

## 2017-12-30 DIAGNOSIS — D509 Iron deficiency anemia, unspecified: Secondary | ICD-10-CM | POA: Diagnosis not present

## 2017-12-30 DIAGNOSIS — N186 End stage renal disease: Secondary | ICD-10-CM | POA: Diagnosis not present

## 2017-12-31 DIAGNOSIS — D631 Anemia in chronic kidney disease: Secondary | ICD-10-CM | POA: Diagnosis not present

## 2017-12-31 DIAGNOSIS — N2581 Secondary hyperparathyroidism of renal origin: Secondary | ICD-10-CM | POA: Diagnosis not present

## 2017-12-31 DIAGNOSIS — N186 End stage renal disease: Secondary | ICD-10-CM | POA: Diagnosis not present

## 2017-12-31 DIAGNOSIS — Z992 Dependence on renal dialysis: Secondary | ICD-10-CM | POA: Diagnosis not present

## 2017-12-31 DIAGNOSIS — D509 Iron deficiency anemia, unspecified: Secondary | ICD-10-CM | POA: Diagnosis not present

## 2018-01-01 DIAGNOSIS — N2581 Secondary hyperparathyroidism of renal origin: Secondary | ICD-10-CM | POA: Diagnosis not present

## 2018-01-01 DIAGNOSIS — D631 Anemia in chronic kidney disease: Secondary | ICD-10-CM | POA: Diagnosis not present

## 2018-01-01 DIAGNOSIS — N186 End stage renal disease: Secondary | ICD-10-CM | POA: Diagnosis not present

## 2018-01-01 DIAGNOSIS — D509 Iron deficiency anemia, unspecified: Secondary | ICD-10-CM | POA: Diagnosis not present

## 2018-01-01 DIAGNOSIS — Z992 Dependence on renal dialysis: Secondary | ICD-10-CM | POA: Diagnosis not present

## 2018-01-02 DIAGNOSIS — D509 Iron deficiency anemia, unspecified: Secondary | ICD-10-CM | POA: Diagnosis not present

## 2018-01-02 DIAGNOSIS — N2581 Secondary hyperparathyroidism of renal origin: Secondary | ICD-10-CM | POA: Diagnosis not present

## 2018-01-02 DIAGNOSIS — Z992 Dependence on renal dialysis: Secondary | ICD-10-CM | POA: Diagnosis not present

## 2018-01-02 DIAGNOSIS — N186 End stage renal disease: Secondary | ICD-10-CM | POA: Diagnosis not present

## 2018-01-02 DIAGNOSIS — D631 Anemia in chronic kidney disease: Secondary | ICD-10-CM | POA: Diagnosis not present

## 2018-01-03 DIAGNOSIS — D631 Anemia in chronic kidney disease: Secondary | ICD-10-CM | POA: Diagnosis not present

## 2018-01-03 DIAGNOSIS — N186 End stage renal disease: Secondary | ICD-10-CM | POA: Diagnosis not present

## 2018-01-03 DIAGNOSIS — N2581 Secondary hyperparathyroidism of renal origin: Secondary | ICD-10-CM | POA: Diagnosis not present

## 2018-01-03 DIAGNOSIS — D509 Iron deficiency anemia, unspecified: Secondary | ICD-10-CM | POA: Diagnosis not present

## 2018-01-03 DIAGNOSIS — Z992 Dependence on renal dialysis: Secondary | ICD-10-CM | POA: Diagnosis not present

## 2018-01-04 DIAGNOSIS — D509 Iron deficiency anemia, unspecified: Secondary | ICD-10-CM | POA: Diagnosis not present

## 2018-01-04 DIAGNOSIS — N186 End stage renal disease: Secondary | ICD-10-CM | POA: Diagnosis not present

## 2018-01-04 DIAGNOSIS — Z992 Dependence on renal dialysis: Secondary | ICD-10-CM | POA: Diagnosis not present

## 2018-01-04 DIAGNOSIS — D631 Anemia in chronic kidney disease: Secondary | ICD-10-CM | POA: Diagnosis not present

## 2018-01-04 DIAGNOSIS — N2581 Secondary hyperparathyroidism of renal origin: Secondary | ICD-10-CM | POA: Diagnosis not present

## 2018-01-05 DIAGNOSIS — D631 Anemia in chronic kidney disease: Secondary | ICD-10-CM | POA: Diagnosis not present

## 2018-01-05 DIAGNOSIS — D509 Iron deficiency anemia, unspecified: Secondary | ICD-10-CM | POA: Diagnosis not present

## 2018-01-05 DIAGNOSIS — Z992 Dependence on renal dialysis: Secondary | ICD-10-CM | POA: Diagnosis not present

## 2018-01-05 DIAGNOSIS — N186 End stage renal disease: Secondary | ICD-10-CM | POA: Diagnosis not present

## 2018-01-05 DIAGNOSIS — N2581 Secondary hyperparathyroidism of renal origin: Secondary | ICD-10-CM | POA: Diagnosis not present

## 2018-01-06 DIAGNOSIS — D509 Iron deficiency anemia, unspecified: Secondary | ICD-10-CM | POA: Diagnosis not present

## 2018-01-06 DIAGNOSIS — Z992 Dependence on renal dialysis: Secondary | ICD-10-CM | POA: Diagnosis not present

## 2018-01-06 DIAGNOSIS — N186 End stage renal disease: Secondary | ICD-10-CM | POA: Diagnosis not present

## 2018-01-06 DIAGNOSIS — D631 Anemia in chronic kidney disease: Secondary | ICD-10-CM | POA: Diagnosis not present

## 2018-01-06 DIAGNOSIS — N2581 Secondary hyperparathyroidism of renal origin: Secondary | ICD-10-CM | POA: Diagnosis not present

## 2018-01-07 DIAGNOSIS — N186 End stage renal disease: Secondary | ICD-10-CM | POA: Diagnosis not present

## 2018-01-07 DIAGNOSIS — D509 Iron deficiency anemia, unspecified: Secondary | ICD-10-CM | POA: Diagnosis not present

## 2018-01-07 DIAGNOSIS — N2581 Secondary hyperparathyroidism of renal origin: Secondary | ICD-10-CM | POA: Diagnosis not present

## 2018-01-07 DIAGNOSIS — D631 Anemia in chronic kidney disease: Secondary | ICD-10-CM | POA: Diagnosis not present

## 2018-01-07 DIAGNOSIS — Z992 Dependence on renal dialysis: Secondary | ICD-10-CM | POA: Diagnosis not present

## 2018-01-08 DIAGNOSIS — D509 Iron deficiency anemia, unspecified: Secondary | ICD-10-CM | POA: Diagnosis not present

## 2018-01-08 DIAGNOSIS — N186 End stage renal disease: Secondary | ICD-10-CM | POA: Diagnosis not present

## 2018-01-08 DIAGNOSIS — Z992 Dependence on renal dialysis: Secondary | ICD-10-CM | POA: Diagnosis not present

## 2018-01-08 DIAGNOSIS — D631 Anemia in chronic kidney disease: Secondary | ICD-10-CM | POA: Diagnosis not present

## 2018-01-08 DIAGNOSIS — N2581 Secondary hyperparathyroidism of renal origin: Secondary | ICD-10-CM | POA: Diagnosis not present

## 2018-01-09 DIAGNOSIS — Z992 Dependence on renal dialysis: Secondary | ICD-10-CM | POA: Diagnosis not present

## 2018-01-09 DIAGNOSIS — D631 Anemia in chronic kidney disease: Secondary | ICD-10-CM | POA: Diagnosis not present

## 2018-01-09 DIAGNOSIS — D509 Iron deficiency anemia, unspecified: Secondary | ICD-10-CM | POA: Diagnosis not present

## 2018-01-09 DIAGNOSIS — N2581 Secondary hyperparathyroidism of renal origin: Secondary | ICD-10-CM | POA: Diagnosis not present

## 2018-01-09 DIAGNOSIS — N186 End stage renal disease: Secondary | ICD-10-CM | POA: Diagnosis not present

## 2018-01-10 DIAGNOSIS — N186 End stage renal disease: Secondary | ICD-10-CM | POA: Diagnosis not present

## 2018-01-10 DIAGNOSIS — N2581 Secondary hyperparathyroidism of renal origin: Secondary | ICD-10-CM | POA: Diagnosis not present

## 2018-01-10 DIAGNOSIS — D631 Anemia in chronic kidney disease: Secondary | ICD-10-CM | POA: Diagnosis not present

## 2018-01-10 DIAGNOSIS — D509 Iron deficiency anemia, unspecified: Secondary | ICD-10-CM | POA: Diagnosis not present

## 2018-01-10 DIAGNOSIS — Z992 Dependence on renal dialysis: Secondary | ICD-10-CM | POA: Diagnosis not present

## 2018-01-11 DIAGNOSIS — D509 Iron deficiency anemia, unspecified: Secondary | ICD-10-CM | POA: Diagnosis not present

## 2018-01-11 DIAGNOSIS — D631 Anemia in chronic kidney disease: Secondary | ICD-10-CM | POA: Diagnosis not present

## 2018-01-11 DIAGNOSIS — N186 End stage renal disease: Secondary | ICD-10-CM | POA: Diagnosis not present

## 2018-01-11 DIAGNOSIS — N2581 Secondary hyperparathyroidism of renal origin: Secondary | ICD-10-CM | POA: Diagnosis not present

## 2018-01-11 DIAGNOSIS — Z992 Dependence on renal dialysis: Secondary | ICD-10-CM | POA: Diagnosis not present

## 2018-01-12 DIAGNOSIS — Z992 Dependence on renal dialysis: Secondary | ICD-10-CM | POA: Diagnosis not present

## 2018-01-12 DIAGNOSIS — D509 Iron deficiency anemia, unspecified: Secondary | ICD-10-CM | POA: Diagnosis not present

## 2018-01-12 DIAGNOSIS — N2581 Secondary hyperparathyroidism of renal origin: Secondary | ICD-10-CM | POA: Diagnosis not present

## 2018-01-12 DIAGNOSIS — D631 Anemia in chronic kidney disease: Secondary | ICD-10-CM | POA: Diagnosis not present

## 2018-01-12 DIAGNOSIS — N186 End stage renal disease: Secondary | ICD-10-CM | POA: Diagnosis not present

## 2018-01-13 DIAGNOSIS — D509 Iron deficiency anemia, unspecified: Secondary | ICD-10-CM | POA: Diagnosis not present

## 2018-01-13 DIAGNOSIS — Z992 Dependence on renal dialysis: Secondary | ICD-10-CM | POA: Diagnosis not present

## 2018-01-13 DIAGNOSIS — D631 Anemia in chronic kidney disease: Secondary | ICD-10-CM | POA: Diagnosis not present

## 2018-01-13 DIAGNOSIS — N2581 Secondary hyperparathyroidism of renal origin: Secondary | ICD-10-CM | POA: Diagnosis not present

## 2018-01-13 DIAGNOSIS — N186 End stage renal disease: Secondary | ICD-10-CM | POA: Diagnosis not present

## 2018-01-14 DIAGNOSIS — Z5181 Encounter for therapeutic drug level monitoring: Secondary | ICD-10-CM | POA: Diagnosis not present

## 2018-01-14 DIAGNOSIS — D631 Anemia in chronic kidney disease: Secondary | ICD-10-CM | POA: Diagnosis not present

## 2018-01-14 DIAGNOSIS — N186 End stage renal disease: Secondary | ICD-10-CM | POA: Diagnosis not present

## 2018-01-14 DIAGNOSIS — I2699 Other pulmonary embolism without acute cor pulmonale: Secondary | ICD-10-CM | POA: Diagnosis not present

## 2018-01-14 DIAGNOSIS — N2581 Secondary hyperparathyroidism of renal origin: Secondary | ICD-10-CM | POA: Diagnosis not present

## 2018-01-14 DIAGNOSIS — D509 Iron deficiency anemia, unspecified: Secondary | ICD-10-CM | POA: Diagnosis not present

## 2018-01-14 DIAGNOSIS — Z7901 Long term (current) use of anticoagulants: Secondary | ICD-10-CM | POA: Diagnosis not present

## 2018-01-14 DIAGNOSIS — Z992 Dependence on renal dialysis: Secondary | ICD-10-CM | POA: Diagnosis not present

## 2018-01-14 DIAGNOSIS — M1A9XX0 Chronic gout, unspecified, without tophus (tophi): Secondary | ICD-10-CM | POA: Diagnosis not present

## 2018-01-15 DIAGNOSIS — D631 Anemia in chronic kidney disease: Secondary | ICD-10-CM | POA: Diagnosis not present

## 2018-01-15 DIAGNOSIS — Z992 Dependence on renal dialysis: Secondary | ICD-10-CM | POA: Diagnosis not present

## 2018-01-15 DIAGNOSIS — N186 End stage renal disease: Secondary | ICD-10-CM | POA: Diagnosis not present

## 2018-01-15 DIAGNOSIS — N2581 Secondary hyperparathyroidism of renal origin: Secondary | ICD-10-CM | POA: Diagnosis not present

## 2018-01-15 DIAGNOSIS — D509 Iron deficiency anemia, unspecified: Secondary | ICD-10-CM | POA: Diagnosis not present

## 2018-01-16 DIAGNOSIS — D509 Iron deficiency anemia, unspecified: Secondary | ICD-10-CM | POA: Diagnosis not present

## 2018-01-16 DIAGNOSIS — N186 End stage renal disease: Secondary | ICD-10-CM | POA: Diagnosis not present

## 2018-01-16 DIAGNOSIS — Z992 Dependence on renal dialysis: Secondary | ICD-10-CM | POA: Diagnosis not present

## 2018-01-16 DIAGNOSIS — N2581 Secondary hyperparathyroidism of renal origin: Secondary | ICD-10-CM | POA: Diagnosis not present

## 2018-01-16 DIAGNOSIS — D631 Anemia in chronic kidney disease: Secondary | ICD-10-CM | POA: Diagnosis not present

## 2018-01-17 DIAGNOSIS — D631 Anemia in chronic kidney disease: Secondary | ICD-10-CM | POA: Diagnosis not present

## 2018-01-17 DIAGNOSIS — D509 Iron deficiency anemia, unspecified: Secondary | ICD-10-CM | POA: Diagnosis not present

## 2018-01-17 DIAGNOSIS — N186 End stage renal disease: Secondary | ICD-10-CM | POA: Diagnosis not present

## 2018-01-17 DIAGNOSIS — N2581 Secondary hyperparathyroidism of renal origin: Secondary | ICD-10-CM | POA: Diagnosis not present

## 2018-01-17 DIAGNOSIS — Z992 Dependence on renal dialysis: Secondary | ICD-10-CM | POA: Diagnosis not present

## 2018-01-18 DIAGNOSIS — N2581 Secondary hyperparathyroidism of renal origin: Secondary | ICD-10-CM | POA: Diagnosis not present

## 2018-01-18 DIAGNOSIS — D631 Anemia in chronic kidney disease: Secondary | ICD-10-CM | POA: Diagnosis not present

## 2018-01-18 DIAGNOSIS — Z992 Dependence on renal dialysis: Secondary | ICD-10-CM | POA: Diagnosis not present

## 2018-01-18 DIAGNOSIS — N186 End stage renal disease: Secondary | ICD-10-CM | POA: Diagnosis not present

## 2018-01-18 DIAGNOSIS — D509 Iron deficiency anemia, unspecified: Secondary | ICD-10-CM | POA: Diagnosis not present

## 2018-01-19 DIAGNOSIS — N2581 Secondary hyperparathyroidism of renal origin: Secondary | ICD-10-CM | POA: Diagnosis not present

## 2018-01-19 DIAGNOSIS — D631 Anemia in chronic kidney disease: Secondary | ICD-10-CM | POA: Diagnosis not present

## 2018-01-19 DIAGNOSIS — N186 End stage renal disease: Secondary | ICD-10-CM | POA: Diagnosis not present

## 2018-01-19 DIAGNOSIS — Z992 Dependence on renal dialysis: Secondary | ICD-10-CM | POA: Diagnosis not present

## 2018-01-19 DIAGNOSIS — D509 Iron deficiency anemia, unspecified: Secondary | ICD-10-CM | POA: Diagnosis not present

## 2018-01-20 DIAGNOSIS — Z992 Dependence on renal dialysis: Secondary | ICD-10-CM | POA: Diagnosis not present

## 2018-01-20 DIAGNOSIS — N186 End stage renal disease: Secondary | ICD-10-CM | POA: Diagnosis not present

## 2018-01-20 DIAGNOSIS — D631 Anemia in chronic kidney disease: Secondary | ICD-10-CM | POA: Diagnosis not present

## 2018-01-20 DIAGNOSIS — N2581 Secondary hyperparathyroidism of renal origin: Secondary | ICD-10-CM | POA: Diagnosis not present

## 2018-01-20 DIAGNOSIS — D509 Iron deficiency anemia, unspecified: Secondary | ICD-10-CM | POA: Diagnosis not present

## 2018-01-21 DIAGNOSIS — D509 Iron deficiency anemia, unspecified: Secondary | ICD-10-CM | POA: Diagnosis not present

## 2018-01-21 DIAGNOSIS — N2581 Secondary hyperparathyroidism of renal origin: Secondary | ICD-10-CM | POA: Diagnosis not present

## 2018-01-21 DIAGNOSIS — D631 Anemia in chronic kidney disease: Secondary | ICD-10-CM | POA: Diagnosis not present

## 2018-01-21 DIAGNOSIS — Z992 Dependence on renal dialysis: Secondary | ICD-10-CM | POA: Diagnosis not present

## 2018-01-21 DIAGNOSIS — N186 End stage renal disease: Secondary | ICD-10-CM | POA: Diagnosis not present

## 2018-01-22 DIAGNOSIS — D631 Anemia in chronic kidney disease: Secondary | ICD-10-CM | POA: Diagnosis not present

## 2018-01-22 DIAGNOSIS — Z992 Dependence on renal dialysis: Secondary | ICD-10-CM | POA: Diagnosis not present

## 2018-01-22 DIAGNOSIS — N2581 Secondary hyperparathyroidism of renal origin: Secondary | ICD-10-CM | POA: Diagnosis not present

## 2018-01-22 DIAGNOSIS — D509 Iron deficiency anemia, unspecified: Secondary | ICD-10-CM | POA: Diagnosis not present

## 2018-01-22 DIAGNOSIS — N186 End stage renal disease: Secondary | ICD-10-CM | POA: Diagnosis not present

## 2018-01-23 DIAGNOSIS — D509 Iron deficiency anemia, unspecified: Secondary | ICD-10-CM | POA: Diagnosis not present

## 2018-01-23 DIAGNOSIS — Z992 Dependence on renal dialysis: Secondary | ICD-10-CM | POA: Diagnosis not present

## 2018-01-23 DIAGNOSIS — D631 Anemia in chronic kidney disease: Secondary | ICD-10-CM | POA: Diagnosis not present

## 2018-01-23 DIAGNOSIS — N2581 Secondary hyperparathyroidism of renal origin: Secondary | ICD-10-CM | POA: Diagnosis not present

## 2018-01-23 DIAGNOSIS — N186 End stage renal disease: Secondary | ICD-10-CM | POA: Diagnosis not present

## 2018-01-24 DIAGNOSIS — D509 Iron deficiency anemia, unspecified: Secondary | ICD-10-CM | POA: Diagnosis not present

## 2018-01-24 DIAGNOSIS — N2581 Secondary hyperparathyroidism of renal origin: Secondary | ICD-10-CM | POA: Diagnosis not present

## 2018-01-24 DIAGNOSIS — Z992 Dependence on renal dialysis: Secondary | ICD-10-CM | POA: Diagnosis not present

## 2018-01-24 DIAGNOSIS — N186 End stage renal disease: Secondary | ICD-10-CM | POA: Diagnosis not present

## 2018-01-24 DIAGNOSIS — D631 Anemia in chronic kidney disease: Secondary | ICD-10-CM | POA: Diagnosis not present

## 2018-01-25 DIAGNOSIS — N2581 Secondary hyperparathyroidism of renal origin: Secondary | ICD-10-CM | POA: Diagnosis not present

## 2018-01-25 DIAGNOSIS — D631 Anemia in chronic kidney disease: Secondary | ICD-10-CM | POA: Diagnosis not present

## 2018-01-25 DIAGNOSIS — D509 Iron deficiency anemia, unspecified: Secondary | ICD-10-CM | POA: Diagnosis not present

## 2018-01-25 DIAGNOSIS — Z0189 Encounter for other specified special examinations: Secondary | ICD-10-CM | POA: Diagnosis not present

## 2018-01-25 DIAGNOSIS — N186 End stage renal disease: Secondary | ICD-10-CM | POA: Diagnosis not present

## 2018-01-25 DIAGNOSIS — Z992 Dependence on renal dialysis: Secondary | ICD-10-CM | POA: Diagnosis not present

## 2018-01-26 DIAGNOSIS — Z7901 Long term (current) use of anticoagulants: Secondary | ICD-10-CM | POA: Diagnosis not present

## 2018-01-26 DIAGNOSIS — N185 Chronic kidney disease, stage 5: Secondary | ICD-10-CM | POA: Diagnosis not present

## 2018-01-26 DIAGNOSIS — D631 Anemia in chronic kidney disease: Secondary | ICD-10-CM | POA: Diagnosis not present

## 2018-01-26 DIAGNOSIS — I2699 Other pulmonary embolism without acute cor pulmonale: Secondary | ICD-10-CM | POA: Diagnosis not present

## 2018-01-26 DIAGNOSIS — N186 End stage renal disease: Secondary | ICD-10-CM | POA: Diagnosis not present

## 2018-01-26 DIAGNOSIS — Z992 Dependence on renal dialysis: Secondary | ICD-10-CM | POA: Diagnosis not present

## 2018-01-26 DIAGNOSIS — N2581 Secondary hyperparathyroidism of renal origin: Secondary | ICD-10-CM | POA: Diagnosis not present

## 2018-01-26 DIAGNOSIS — D509 Iron deficiency anemia, unspecified: Secondary | ICD-10-CM | POA: Diagnosis not present

## 2018-01-26 DIAGNOSIS — Z5181 Encounter for therapeutic drug level monitoring: Secondary | ICD-10-CM | POA: Diagnosis not present

## 2018-01-27 DIAGNOSIS — Z992 Dependence on renal dialysis: Secondary | ICD-10-CM | POA: Diagnosis not present

## 2018-01-27 DIAGNOSIS — N186 End stage renal disease: Secondary | ICD-10-CM | POA: Diagnosis not present

## 2018-01-27 DIAGNOSIS — N2581 Secondary hyperparathyroidism of renal origin: Secondary | ICD-10-CM | POA: Diagnosis not present

## 2018-01-27 DIAGNOSIS — D509 Iron deficiency anemia, unspecified: Secondary | ICD-10-CM | POA: Diagnosis not present

## 2018-01-27 DIAGNOSIS — D631 Anemia in chronic kidney disease: Secondary | ICD-10-CM | POA: Diagnosis not present

## 2018-01-28 DIAGNOSIS — N186 End stage renal disease: Secondary | ICD-10-CM | POA: Diagnosis not present

## 2018-01-28 DIAGNOSIS — Z992 Dependence on renal dialysis: Secondary | ICD-10-CM | POA: Diagnosis not present

## 2018-01-29 DIAGNOSIS — N186 End stage renal disease: Secondary | ICD-10-CM | POA: Diagnosis not present

## 2018-01-29 DIAGNOSIS — Z992 Dependence on renal dialysis: Secondary | ICD-10-CM | POA: Diagnosis not present

## 2018-01-30 DIAGNOSIS — N186 End stage renal disease: Secondary | ICD-10-CM | POA: Diagnosis not present

## 2018-01-30 DIAGNOSIS — Z992 Dependence on renal dialysis: Secondary | ICD-10-CM | POA: Diagnosis not present

## 2018-01-30 DIAGNOSIS — Z79899 Other long term (current) drug therapy: Secondary | ICD-10-CM | POA: Diagnosis not present

## 2018-01-30 DIAGNOSIS — D631 Anemia in chronic kidney disease: Secondary | ICD-10-CM | POA: Diagnosis not present

## 2018-01-31 DIAGNOSIS — Z992 Dependence on renal dialysis: Secondary | ICD-10-CM | POA: Diagnosis not present

## 2018-01-31 DIAGNOSIS — N2581 Secondary hyperparathyroidism of renal origin: Secondary | ICD-10-CM | POA: Diagnosis not present

## 2018-01-31 DIAGNOSIS — N186 End stage renal disease: Secondary | ICD-10-CM | POA: Diagnosis not present

## 2018-01-31 DIAGNOSIS — D509 Iron deficiency anemia, unspecified: Secondary | ICD-10-CM | POA: Diagnosis not present

## 2018-02-02 DIAGNOSIS — D509 Iron deficiency anemia, unspecified: Secondary | ICD-10-CM | POA: Diagnosis not present

## 2018-02-02 DIAGNOSIS — N2581 Secondary hyperparathyroidism of renal origin: Secondary | ICD-10-CM | POA: Diagnosis not present

## 2018-02-02 DIAGNOSIS — Z992 Dependence on renal dialysis: Secondary | ICD-10-CM | POA: Diagnosis not present

## 2018-02-02 DIAGNOSIS — N186 End stage renal disease: Secondary | ICD-10-CM | POA: Diagnosis not present

## 2018-02-03 DIAGNOSIS — D509 Iron deficiency anemia, unspecified: Secondary | ICD-10-CM | POA: Diagnosis not present

## 2018-02-03 DIAGNOSIS — N186 End stage renal disease: Secondary | ICD-10-CM | POA: Diagnosis not present

## 2018-02-03 DIAGNOSIS — N2581 Secondary hyperparathyroidism of renal origin: Secondary | ICD-10-CM | POA: Diagnosis not present

## 2018-02-03 DIAGNOSIS — Z992 Dependence on renal dialysis: Secondary | ICD-10-CM | POA: Diagnosis not present

## 2018-02-04 DIAGNOSIS — Z5181 Encounter for therapeutic drug level monitoring: Secondary | ICD-10-CM | POA: Diagnosis not present

## 2018-02-04 DIAGNOSIS — I2699 Other pulmonary embolism without acute cor pulmonale: Secondary | ICD-10-CM | POA: Diagnosis not present

## 2018-02-04 DIAGNOSIS — Z992 Dependence on renal dialysis: Secondary | ICD-10-CM | POA: Diagnosis not present

## 2018-02-04 DIAGNOSIS — D509 Iron deficiency anemia, unspecified: Secondary | ICD-10-CM | POA: Diagnosis not present

## 2018-02-04 DIAGNOSIS — N2581 Secondary hyperparathyroidism of renal origin: Secondary | ICD-10-CM | POA: Diagnosis not present

## 2018-02-04 DIAGNOSIS — N186 End stage renal disease: Secondary | ICD-10-CM | POA: Diagnosis not present

## 2018-02-04 DIAGNOSIS — Z6841 Body Mass Index (BMI) 40.0 and over, adult: Secondary | ICD-10-CM | POA: Diagnosis not present

## 2018-02-04 DIAGNOSIS — Z7901 Long term (current) use of anticoagulants: Secondary | ICD-10-CM | POA: Diagnosis not present

## 2018-02-05 DIAGNOSIS — D509 Iron deficiency anemia, unspecified: Secondary | ICD-10-CM | POA: Diagnosis not present

## 2018-02-05 DIAGNOSIS — N186 End stage renal disease: Secondary | ICD-10-CM | POA: Diagnosis not present

## 2018-02-05 DIAGNOSIS — Z992 Dependence on renal dialysis: Secondary | ICD-10-CM | POA: Diagnosis not present

## 2018-02-05 DIAGNOSIS — N2581 Secondary hyperparathyroidism of renal origin: Secondary | ICD-10-CM | POA: Diagnosis not present

## 2018-02-06 DIAGNOSIS — D509 Iron deficiency anemia, unspecified: Secondary | ICD-10-CM | POA: Diagnosis not present

## 2018-02-06 DIAGNOSIS — Z992 Dependence on renal dialysis: Secondary | ICD-10-CM | POA: Diagnosis not present

## 2018-02-06 DIAGNOSIS — N2581 Secondary hyperparathyroidism of renal origin: Secondary | ICD-10-CM | POA: Diagnosis not present

## 2018-02-06 DIAGNOSIS — N186 End stage renal disease: Secondary | ICD-10-CM | POA: Diagnosis not present

## 2018-02-07 DIAGNOSIS — N186 End stage renal disease: Secondary | ICD-10-CM | POA: Diagnosis not present

## 2018-02-07 DIAGNOSIS — Z992 Dependence on renal dialysis: Secondary | ICD-10-CM | POA: Diagnosis not present

## 2018-02-07 DIAGNOSIS — N2581 Secondary hyperparathyroidism of renal origin: Secondary | ICD-10-CM | POA: Diagnosis not present

## 2018-02-07 DIAGNOSIS — D509 Iron deficiency anemia, unspecified: Secondary | ICD-10-CM | POA: Diagnosis not present

## 2018-02-08 DIAGNOSIS — D509 Iron deficiency anemia, unspecified: Secondary | ICD-10-CM | POA: Diagnosis not present

## 2018-02-08 DIAGNOSIS — N2581 Secondary hyperparathyroidism of renal origin: Secondary | ICD-10-CM | POA: Diagnosis not present

## 2018-02-08 DIAGNOSIS — Z992 Dependence on renal dialysis: Secondary | ICD-10-CM | POA: Diagnosis not present

## 2018-02-08 DIAGNOSIS — N186 End stage renal disease: Secondary | ICD-10-CM | POA: Diagnosis not present

## 2018-02-09 DIAGNOSIS — D509 Iron deficiency anemia, unspecified: Secondary | ICD-10-CM | POA: Diagnosis not present

## 2018-02-09 DIAGNOSIS — N2581 Secondary hyperparathyroidism of renal origin: Secondary | ICD-10-CM | POA: Diagnosis not present

## 2018-02-09 DIAGNOSIS — N186 End stage renal disease: Secondary | ICD-10-CM | POA: Diagnosis not present

## 2018-02-09 DIAGNOSIS — Z992 Dependence on renal dialysis: Secondary | ICD-10-CM | POA: Diagnosis not present

## 2018-02-10 DIAGNOSIS — D509 Iron deficiency anemia, unspecified: Secondary | ICD-10-CM | POA: Diagnosis not present

## 2018-02-10 DIAGNOSIS — N2581 Secondary hyperparathyroidism of renal origin: Secondary | ICD-10-CM | POA: Diagnosis not present

## 2018-02-10 DIAGNOSIS — Z992 Dependence on renal dialysis: Secondary | ICD-10-CM | POA: Diagnosis not present

## 2018-02-10 DIAGNOSIS — N186 End stage renal disease: Secondary | ICD-10-CM | POA: Diagnosis not present

## 2018-02-11 DIAGNOSIS — N2581 Secondary hyperparathyroidism of renal origin: Secondary | ICD-10-CM | POA: Diagnosis not present

## 2018-02-11 DIAGNOSIS — N186 End stage renal disease: Secondary | ICD-10-CM | POA: Diagnosis not present

## 2018-02-11 DIAGNOSIS — D509 Iron deficiency anemia, unspecified: Secondary | ICD-10-CM | POA: Diagnosis not present

## 2018-02-11 DIAGNOSIS — Z992 Dependence on renal dialysis: Secondary | ICD-10-CM | POA: Diagnosis not present

## 2018-02-12 DIAGNOSIS — Z992 Dependence on renal dialysis: Secondary | ICD-10-CM | POA: Diagnosis not present

## 2018-02-12 DIAGNOSIS — D509 Iron deficiency anemia, unspecified: Secondary | ICD-10-CM | POA: Diagnosis not present

## 2018-02-12 DIAGNOSIS — N186 End stage renal disease: Secondary | ICD-10-CM | POA: Diagnosis not present

## 2018-02-12 DIAGNOSIS — N2581 Secondary hyperparathyroidism of renal origin: Secondary | ICD-10-CM | POA: Diagnosis not present

## 2018-02-13 DIAGNOSIS — Z992 Dependence on renal dialysis: Secondary | ICD-10-CM | POA: Diagnosis not present

## 2018-02-13 DIAGNOSIS — N2581 Secondary hyperparathyroidism of renal origin: Secondary | ICD-10-CM | POA: Diagnosis not present

## 2018-02-13 DIAGNOSIS — D509 Iron deficiency anemia, unspecified: Secondary | ICD-10-CM | POA: Diagnosis not present

## 2018-02-13 DIAGNOSIS — N186 End stage renal disease: Secondary | ICD-10-CM | POA: Diagnosis not present

## 2018-02-14 DIAGNOSIS — Z992 Dependence on renal dialysis: Secondary | ICD-10-CM | POA: Diagnosis not present

## 2018-02-14 DIAGNOSIS — N186 End stage renal disease: Secondary | ICD-10-CM | POA: Diagnosis not present

## 2018-02-14 DIAGNOSIS — N2581 Secondary hyperparathyroidism of renal origin: Secondary | ICD-10-CM | POA: Diagnosis not present

## 2018-02-14 DIAGNOSIS — D509 Iron deficiency anemia, unspecified: Secondary | ICD-10-CM | POA: Diagnosis not present

## 2018-02-15 DIAGNOSIS — D509 Iron deficiency anemia, unspecified: Secondary | ICD-10-CM | POA: Diagnosis not present

## 2018-02-15 DIAGNOSIS — N186 End stage renal disease: Secondary | ICD-10-CM | POA: Diagnosis not present

## 2018-02-15 DIAGNOSIS — N2581 Secondary hyperparathyroidism of renal origin: Secondary | ICD-10-CM | POA: Diagnosis not present

## 2018-02-15 DIAGNOSIS — Z992 Dependence on renal dialysis: Secondary | ICD-10-CM | POA: Diagnosis not present

## 2018-02-16 DIAGNOSIS — N2581 Secondary hyperparathyroidism of renal origin: Secondary | ICD-10-CM | POA: Diagnosis not present

## 2018-02-16 DIAGNOSIS — D509 Iron deficiency anemia, unspecified: Secondary | ICD-10-CM | POA: Diagnosis not present

## 2018-02-16 DIAGNOSIS — Z992 Dependence on renal dialysis: Secondary | ICD-10-CM | POA: Diagnosis not present

## 2018-02-16 DIAGNOSIS — N186 End stage renal disease: Secondary | ICD-10-CM | POA: Diagnosis not present

## 2018-02-17 DIAGNOSIS — N2581 Secondary hyperparathyroidism of renal origin: Secondary | ICD-10-CM | POA: Diagnosis not present

## 2018-02-17 DIAGNOSIS — N186 End stage renal disease: Secondary | ICD-10-CM | POA: Diagnosis not present

## 2018-02-17 DIAGNOSIS — D509 Iron deficiency anemia, unspecified: Secondary | ICD-10-CM | POA: Diagnosis not present

## 2018-02-17 DIAGNOSIS — Z992 Dependence on renal dialysis: Secondary | ICD-10-CM | POA: Diagnosis not present

## 2018-02-18 DIAGNOSIS — Z992 Dependence on renal dialysis: Secondary | ICD-10-CM | POA: Diagnosis not present

## 2018-02-18 DIAGNOSIS — D509 Iron deficiency anemia, unspecified: Secondary | ICD-10-CM | POA: Diagnosis not present

## 2018-02-18 DIAGNOSIS — N2581 Secondary hyperparathyroidism of renal origin: Secondary | ICD-10-CM | POA: Diagnosis not present

## 2018-02-18 DIAGNOSIS — N186 End stage renal disease: Secondary | ICD-10-CM | POA: Diagnosis not present

## 2018-02-19 DIAGNOSIS — N2581 Secondary hyperparathyroidism of renal origin: Secondary | ICD-10-CM | POA: Diagnosis not present

## 2018-02-19 DIAGNOSIS — D509 Iron deficiency anemia, unspecified: Secondary | ICD-10-CM | POA: Diagnosis not present

## 2018-02-19 DIAGNOSIS — Z992 Dependence on renal dialysis: Secondary | ICD-10-CM | POA: Diagnosis not present

## 2018-02-19 DIAGNOSIS — N186 End stage renal disease: Secondary | ICD-10-CM | POA: Diagnosis not present

## 2018-02-20 DIAGNOSIS — Z992 Dependence on renal dialysis: Secondary | ICD-10-CM | POA: Diagnosis not present

## 2018-02-20 DIAGNOSIS — D509 Iron deficiency anemia, unspecified: Secondary | ICD-10-CM | POA: Diagnosis not present

## 2018-02-20 DIAGNOSIS — N186 End stage renal disease: Secondary | ICD-10-CM | POA: Diagnosis not present

## 2018-02-20 DIAGNOSIS — N2581 Secondary hyperparathyroidism of renal origin: Secondary | ICD-10-CM | POA: Diagnosis not present

## 2018-02-21 DIAGNOSIS — D509 Iron deficiency anemia, unspecified: Secondary | ICD-10-CM | POA: Diagnosis not present

## 2018-02-21 DIAGNOSIS — Z992 Dependence on renal dialysis: Secondary | ICD-10-CM | POA: Diagnosis not present

## 2018-02-21 DIAGNOSIS — N186 End stage renal disease: Secondary | ICD-10-CM | POA: Diagnosis not present

## 2018-02-21 DIAGNOSIS — N2581 Secondary hyperparathyroidism of renal origin: Secondary | ICD-10-CM | POA: Diagnosis not present

## 2018-02-22 DIAGNOSIS — N186 End stage renal disease: Secondary | ICD-10-CM | POA: Diagnosis not present

## 2018-02-22 DIAGNOSIS — N2581 Secondary hyperparathyroidism of renal origin: Secondary | ICD-10-CM | POA: Diagnosis not present

## 2018-02-22 DIAGNOSIS — D509 Iron deficiency anemia, unspecified: Secondary | ICD-10-CM | POA: Diagnosis not present

## 2018-02-22 DIAGNOSIS — Z992 Dependence on renal dialysis: Secondary | ICD-10-CM | POA: Diagnosis not present

## 2018-02-23 DIAGNOSIS — Z992 Dependence on renal dialysis: Secondary | ICD-10-CM | POA: Diagnosis not present

## 2018-02-23 DIAGNOSIS — N2581 Secondary hyperparathyroidism of renal origin: Secondary | ICD-10-CM | POA: Diagnosis not present

## 2018-02-23 DIAGNOSIS — D509 Iron deficiency anemia, unspecified: Secondary | ICD-10-CM | POA: Diagnosis not present

## 2018-02-23 DIAGNOSIS — N186 End stage renal disease: Secondary | ICD-10-CM | POA: Diagnosis not present

## 2018-02-24 DIAGNOSIS — Z992 Dependence on renal dialysis: Secondary | ICD-10-CM | POA: Diagnosis not present

## 2018-02-24 DIAGNOSIS — N186 End stage renal disease: Secondary | ICD-10-CM | POA: Diagnosis not present

## 2018-02-24 DIAGNOSIS — D509 Iron deficiency anemia, unspecified: Secondary | ICD-10-CM | POA: Diagnosis not present

## 2018-02-24 DIAGNOSIS — N2581 Secondary hyperparathyroidism of renal origin: Secondary | ICD-10-CM | POA: Diagnosis not present

## 2018-02-25 DIAGNOSIS — N2581 Secondary hyperparathyroidism of renal origin: Secondary | ICD-10-CM | POA: Diagnosis not present

## 2018-02-25 DIAGNOSIS — Z992 Dependence on renal dialysis: Secondary | ICD-10-CM | POA: Diagnosis not present

## 2018-02-25 DIAGNOSIS — D509 Iron deficiency anemia, unspecified: Secondary | ICD-10-CM | POA: Diagnosis not present

## 2018-02-25 DIAGNOSIS — N186 End stage renal disease: Secondary | ICD-10-CM | POA: Diagnosis not present

## 2018-02-26 DIAGNOSIS — Z992 Dependence on renal dialysis: Secondary | ICD-10-CM | POA: Diagnosis not present

## 2018-02-26 DIAGNOSIS — N2581 Secondary hyperparathyroidism of renal origin: Secondary | ICD-10-CM | POA: Diagnosis not present

## 2018-02-26 DIAGNOSIS — N186 End stage renal disease: Secondary | ICD-10-CM | POA: Diagnosis not present

## 2018-02-26 DIAGNOSIS — D509 Iron deficiency anemia, unspecified: Secondary | ICD-10-CM | POA: Diagnosis not present

## 2018-02-27 DIAGNOSIS — N186 End stage renal disease: Secondary | ICD-10-CM | POA: Diagnosis not present

## 2018-02-27 DIAGNOSIS — D509 Iron deficiency anemia, unspecified: Secondary | ICD-10-CM | POA: Diagnosis not present

## 2018-02-27 DIAGNOSIS — Z992 Dependence on renal dialysis: Secondary | ICD-10-CM | POA: Diagnosis not present

## 2018-02-27 DIAGNOSIS — N2581 Secondary hyperparathyroidism of renal origin: Secondary | ICD-10-CM | POA: Diagnosis not present

## 2018-02-28 DIAGNOSIS — N186 End stage renal disease: Secondary | ICD-10-CM | POA: Diagnosis not present

## 2018-02-28 DIAGNOSIS — D509 Iron deficiency anemia, unspecified: Secondary | ICD-10-CM | POA: Diagnosis not present

## 2018-02-28 DIAGNOSIS — N2581 Secondary hyperparathyroidism of renal origin: Secondary | ICD-10-CM | POA: Diagnosis not present

## 2018-02-28 DIAGNOSIS — Z992 Dependence on renal dialysis: Secondary | ICD-10-CM | POA: Diagnosis not present

## 2018-02-28 DIAGNOSIS — D631 Anemia in chronic kidney disease: Secondary | ICD-10-CM | POA: Diagnosis not present

## 2018-03-01 DIAGNOSIS — N2581 Secondary hyperparathyroidism of renal origin: Secondary | ICD-10-CM | POA: Diagnosis not present

## 2018-03-01 DIAGNOSIS — N186 End stage renal disease: Secondary | ICD-10-CM | POA: Diagnosis not present

## 2018-03-01 DIAGNOSIS — Z992 Dependence on renal dialysis: Secondary | ICD-10-CM | POA: Diagnosis not present

## 2018-03-01 DIAGNOSIS — D631 Anemia in chronic kidney disease: Secondary | ICD-10-CM | POA: Diagnosis not present

## 2018-03-01 DIAGNOSIS — D509 Iron deficiency anemia, unspecified: Secondary | ICD-10-CM | POA: Diagnosis not present

## 2018-03-02 DIAGNOSIS — N186 End stage renal disease: Secondary | ICD-10-CM | POA: Diagnosis not present

## 2018-03-02 DIAGNOSIS — D631 Anemia in chronic kidney disease: Secondary | ICD-10-CM | POA: Diagnosis not present

## 2018-03-02 DIAGNOSIS — D509 Iron deficiency anemia, unspecified: Secondary | ICD-10-CM | POA: Diagnosis not present

## 2018-03-02 DIAGNOSIS — Z992 Dependence on renal dialysis: Secondary | ICD-10-CM | POA: Diagnosis not present

## 2018-03-02 DIAGNOSIS — N2581 Secondary hyperparathyroidism of renal origin: Secondary | ICD-10-CM | POA: Diagnosis not present

## 2018-03-03 DIAGNOSIS — Z992 Dependence on renal dialysis: Secondary | ICD-10-CM | POA: Diagnosis not present

## 2018-03-03 DIAGNOSIS — D631 Anemia in chronic kidney disease: Secondary | ICD-10-CM | POA: Diagnosis not present

## 2018-03-03 DIAGNOSIS — D509 Iron deficiency anemia, unspecified: Secondary | ICD-10-CM | POA: Diagnosis not present

## 2018-03-03 DIAGNOSIS — N2581 Secondary hyperparathyroidism of renal origin: Secondary | ICD-10-CM | POA: Diagnosis not present

## 2018-03-03 DIAGNOSIS — N186 End stage renal disease: Secondary | ICD-10-CM | POA: Diagnosis not present

## 2018-03-04 DIAGNOSIS — N186 End stage renal disease: Secondary | ICD-10-CM | POA: Diagnosis not present

## 2018-03-04 DIAGNOSIS — Z992 Dependence on renal dialysis: Secondary | ICD-10-CM | POA: Diagnosis not present

## 2018-03-04 DIAGNOSIS — D631 Anemia in chronic kidney disease: Secondary | ICD-10-CM | POA: Diagnosis not present

## 2018-03-04 DIAGNOSIS — D509 Iron deficiency anemia, unspecified: Secondary | ICD-10-CM | POA: Diagnosis not present

## 2018-03-04 DIAGNOSIS — N2581 Secondary hyperparathyroidism of renal origin: Secondary | ICD-10-CM | POA: Diagnosis not present

## 2018-03-05 DIAGNOSIS — N186 End stage renal disease: Secondary | ICD-10-CM | POA: Diagnosis not present

## 2018-03-05 DIAGNOSIS — Z992 Dependence on renal dialysis: Secondary | ICD-10-CM | POA: Diagnosis not present

## 2018-03-05 DIAGNOSIS — N2581 Secondary hyperparathyroidism of renal origin: Secondary | ICD-10-CM | POA: Diagnosis not present

## 2018-03-05 DIAGNOSIS — D631 Anemia in chronic kidney disease: Secondary | ICD-10-CM | POA: Diagnosis not present

## 2018-03-05 DIAGNOSIS — D509 Iron deficiency anemia, unspecified: Secondary | ICD-10-CM | POA: Diagnosis not present

## 2018-03-06 DIAGNOSIS — Z992 Dependence on renal dialysis: Secondary | ICD-10-CM | POA: Diagnosis not present

## 2018-03-06 DIAGNOSIS — D509 Iron deficiency anemia, unspecified: Secondary | ICD-10-CM | POA: Diagnosis not present

## 2018-03-06 DIAGNOSIS — D631 Anemia in chronic kidney disease: Secondary | ICD-10-CM | POA: Diagnosis not present

## 2018-03-06 DIAGNOSIS — N2581 Secondary hyperparathyroidism of renal origin: Secondary | ICD-10-CM | POA: Diagnosis not present

## 2018-03-06 DIAGNOSIS — N186 End stage renal disease: Secondary | ICD-10-CM | POA: Diagnosis not present

## 2018-03-07 DIAGNOSIS — N186 End stage renal disease: Secondary | ICD-10-CM | POA: Diagnosis not present

## 2018-03-07 DIAGNOSIS — Z992 Dependence on renal dialysis: Secondary | ICD-10-CM | POA: Diagnosis not present

## 2018-03-07 DIAGNOSIS — N2581 Secondary hyperparathyroidism of renal origin: Secondary | ICD-10-CM | POA: Diagnosis not present

## 2018-03-07 DIAGNOSIS — D631 Anemia in chronic kidney disease: Secondary | ICD-10-CM | POA: Diagnosis not present

## 2018-03-07 DIAGNOSIS — D509 Iron deficiency anemia, unspecified: Secondary | ICD-10-CM | POA: Diagnosis not present

## 2018-03-08 DIAGNOSIS — N2581 Secondary hyperparathyroidism of renal origin: Secondary | ICD-10-CM | POA: Diagnosis not present

## 2018-03-08 DIAGNOSIS — D631 Anemia in chronic kidney disease: Secondary | ICD-10-CM | POA: Diagnosis not present

## 2018-03-08 DIAGNOSIS — N186 End stage renal disease: Secondary | ICD-10-CM | POA: Diagnosis not present

## 2018-03-08 DIAGNOSIS — D509 Iron deficiency anemia, unspecified: Secondary | ICD-10-CM | POA: Diagnosis not present

## 2018-03-08 DIAGNOSIS — Z992 Dependence on renal dialysis: Secondary | ICD-10-CM | POA: Diagnosis not present

## 2018-03-09 DIAGNOSIS — N186 End stage renal disease: Secondary | ICD-10-CM | POA: Diagnosis not present

## 2018-03-09 DIAGNOSIS — Z992 Dependence on renal dialysis: Secondary | ICD-10-CM | POA: Diagnosis not present

## 2018-03-09 DIAGNOSIS — D631 Anemia in chronic kidney disease: Secondary | ICD-10-CM | POA: Diagnosis not present

## 2018-03-09 DIAGNOSIS — D509 Iron deficiency anemia, unspecified: Secondary | ICD-10-CM | POA: Diagnosis not present

## 2018-03-09 DIAGNOSIS — N2581 Secondary hyperparathyroidism of renal origin: Secondary | ICD-10-CM | POA: Diagnosis not present

## 2018-03-10 DIAGNOSIS — Z992 Dependence on renal dialysis: Secondary | ICD-10-CM | POA: Diagnosis not present

## 2018-03-10 DIAGNOSIS — D509 Iron deficiency anemia, unspecified: Secondary | ICD-10-CM | POA: Diagnosis not present

## 2018-03-10 DIAGNOSIS — N186 End stage renal disease: Secondary | ICD-10-CM | POA: Diagnosis not present

## 2018-03-10 DIAGNOSIS — D631 Anemia in chronic kidney disease: Secondary | ICD-10-CM | POA: Diagnosis not present

## 2018-03-10 DIAGNOSIS — N2581 Secondary hyperparathyroidism of renal origin: Secondary | ICD-10-CM | POA: Diagnosis not present

## 2018-03-11 DIAGNOSIS — D631 Anemia in chronic kidney disease: Secondary | ICD-10-CM | POA: Diagnosis not present

## 2018-03-11 DIAGNOSIS — D509 Iron deficiency anemia, unspecified: Secondary | ICD-10-CM | POA: Diagnosis not present

## 2018-03-11 DIAGNOSIS — N186 End stage renal disease: Secondary | ICD-10-CM | POA: Diagnosis not present

## 2018-03-11 DIAGNOSIS — Z992 Dependence on renal dialysis: Secondary | ICD-10-CM | POA: Diagnosis not present

## 2018-03-11 DIAGNOSIS — N2581 Secondary hyperparathyroidism of renal origin: Secondary | ICD-10-CM | POA: Diagnosis not present

## 2018-03-12 DIAGNOSIS — D631 Anemia in chronic kidney disease: Secondary | ICD-10-CM | POA: Diagnosis not present

## 2018-03-12 DIAGNOSIS — N2581 Secondary hyperparathyroidism of renal origin: Secondary | ICD-10-CM | POA: Diagnosis not present

## 2018-03-12 DIAGNOSIS — D509 Iron deficiency anemia, unspecified: Secondary | ICD-10-CM | POA: Diagnosis not present

## 2018-03-12 DIAGNOSIS — N186 End stage renal disease: Secondary | ICD-10-CM | POA: Diagnosis not present

## 2018-03-12 DIAGNOSIS — Z992 Dependence on renal dialysis: Secondary | ICD-10-CM | POA: Diagnosis not present

## 2018-03-13 DIAGNOSIS — D509 Iron deficiency anemia, unspecified: Secondary | ICD-10-CM | POA: Diagnosis not present

## 2018-03-13 DIAGNOSIS — D631 Anemia in chronic kidney disease: Secondary | ICD-10-CM | POA: Diagnosis not present

## 2018-03-13 DIAGNOSIS — N186 End stage renal disease: Secondary | ICD-10-CM | POA: Diagnosis not present

## 2018-03-13 DIAGNOSIS — Z992 Dependence on renal dialysis: Secondary | ICD-10-CM | POA: Diagnosis not present

## 2018-03-13 DIAGNOSIS — N2581 Secondary hyperparathyroidism of renal origin: Secondary | ICD-10-CM | POA: Diagnosis not present

## 2018-03-14 DIAGNOSIS — N186 End stage renal disease: Secondary | ICD-10-CM | POA: Diagnosis not present

## 2018-03-14 DIAGNOSIS — D631 Anemia in chronic kidney disease: Secondary | ICD-10-CM | POA: Diagnosis not present

## 2018-03-14 DIAGNOSIS — D509 Iron deficiency anemia, unspecified: Secondary | ICD-10-CM | POA: Diagnosis not present

## 2018-03-14 DIAGNOSIS — Z992 Dependence on renal dialysis: Secondary | ICD-10-CM | POA: Diagnosis not present

## 2018-03-14 DIAGNOSIS — N2581 Secondary hyperparathyroidism of renal origin: Secondary | ICD-10-CM | POA: Diagnosis not present

## 2018-03-15 DIAGNOSIS — N186 End stage renal disease: Secondary | ICD-10-CM | POA: Diagnosis not present

## 2018-03-15 DIAGNOSIS — N2581 Secondary hyperparathyroidism of renal origin: Secondary | ICD-10-CM | POA: Diagnosis not present

## 2018-03-15 DIAGNOSIS — Z992 Dependence on renal dialysis: Secondary | ICD-10-CM | POA: Diagnosis not present

## 2018-03-15 DIAGNOSIS — D509 Iron deficiency anemia, unspecified: Secondary | ICD-10-CM | POA: Diagnosis not present

## 2018-03-15 DIAGNOSIS — D631 Anemia in chronic kidney disease: Secondary | ICD-10-CM | POA: Diagnosis not present

## 2018-03-16 DIAGNOSIS — Z992 Dependence on renal dialysis: Secondary | ICD-10-CM | POA: Diagnosis not present

## 2018-03-16 DIAGNOSIS — N2581 Secondary hyperparathyroidism of renal origin: Secondary | ICD-10-CM | POA: Diagnosis not present

## 2018-03-16 DIAGNOSIS — D631 Anemia in chronic kidney disease: Secondary | ICD-10-CM | POA: Diagnosis not present

## 2018-03-16 DIAGNOSIS — D509 Iron deficiency anemia, unspecified: Secondary | ICD-10-CM | POA: Diagnosis not present

## 2018-03-16 DIAGNOSIS — N186 End stage renal disease: Secondary | ICD-10-CM | POA: Diagnosis not present

## 2018-03-17 DIAGNOSIS — Z992 Dependence on renal dialysis: Secondary | ICD-10-CM | POA: Diagnosis not present

## 2018-03-17 DIAGNOSIS — D509 Iron deficiency anemia, unspecified: Secondary | ICD-10-CM | POA: Diagnosis not present

## 2018-03-17 DIAGNOSIS — D631 Anemia in chronic kidney disease: Secondary | ICD-10-CM | POA: Diagnosis not present

## 2018-03-17 DIAGNOSIS — N186 End stage renal disease: Secondary | ICD-10-CM | POA: Diagnosis not present

## 2018-03-17 DIAGNOSIS — N2581 Secondary hyperparathyroidism of renal origin: Secondary | ICD-10-CM | POA: Diagnosis not present

## 2018-03-18 DIAGNOSIS — N2581 Secondary hyperparathyroidism of renal origin: Secondary | ICD-10-CM | POA: Diagnosis not present

## 2018-03-18 DIAGNOSIS — D631 Anemia in chronic kidney disease: Secondary | ICD-10-CM | POA: Diagnosis not present

## 2018-03-18 DIAGNOSIS — N186 End stage renal disease: Secondary | ICD-10-CM | POA: Diagnosis not present

## 2018-03-18 DIAGNOSIS — Z992 Dependence on renal dialysis: Secondary | ICD-10-CM | POA: Diagnosis not present

## 2018-03-18 DIAGNOSIS — D509 Iron deficiency anemia, unspecified: Secondary | ICD-10-CM | POA: Diagnosis not present

## 2018-03-19 DIAGNOSIS — N186 End stage renal disease: Secondary | ICD-10-CM | POA: Diagnosis not present

## 2018-03-19 DIAGNOSIS — D509 Iron deficiency anemia, unspecified: Secondary | ICD-10-CM | POA: Diagnosis not present

## 2018-03-19 DIAGNOSIS — N2581 Secondary hyperparathyroidism of renal origin: Secondary | ICD-10-CM | POA: Diagnosis not present

## 2018-03-19 DIAGNOSIS — Z992 Dependence on renal dialysis: Secondary | ICD-10-CM | POA: Diagnosis not present

## 2018-03-19 DIAGNOSIS — D631 Anemia in chronic kidney disease: Secondary | ICD-10-CM | POA: Diagnosis not present

## 2018-03-20 DIAGNOSIS — Z992 Dependence on renal dialysis: Secondary | ICD-10-CM | POA: Diagnosis not present

## 2018-03-20 DIAGNOSIS — D631 Anemia in chronic kidney disease: Secondary | ICD-10-CM | POA: Diagnosis not present

## 2018-03-20 DIAGNOSIS — D509 Iron deficiency anemia, unspecified: Secondary | ICD-10-CM | POA: Diagnosis not present

## 2018-03-20 DIAGNOSIS — N186 End stage renal disease: Secondary | ICD-10-CM | POA: Diagnosis not present

## 2018-03-20 DIAGNOSIS — N2581 Secondary hyperparathyroidism of renal origin: Secondary | ICD-10-CM | POA: Diagnosis not present

## 2018-03-21 DIAGNOSIS — D509 Iron deficiency anemia, unspecified: Secondary | ICD-10-CM | POA: Diagnosis not present

## 2018-03-21 DIAGNOSIS — Z992 Dependence on renal dialysis: Secondary | ICD-10-CM | POA: Diagnosis not present

## 2018-03-21 DIAGNOSIS — N2581 Secondary hyperparathyroidism of renal origin: Secondary | ICD-10-CM | POA: Diagnosis not present

## 2018-03-21 DIAGNOSIS — N186 End stage renal disease: Secondary | ICD-10-CM | POA: Diagnosis not present

## 2018-03-21 DIAGNOSIS — D631 Anemia in chronic kidney disease: Secondary | ICD-10-CM | POA: Diagnosis not present

## 2018-03-22 DIAGNOSIS — Z992 Dependence on renal dialysis: Secondary | ICD-10-CM | POA: Diagnosis not present

## 2018-03-22 DIAGNOSIS — N2581 Secondary hyperparathyroidism of renal origin: Secondary | ICD-10-CM | POA: Diagnosis not present

## 2018-03-22 DIAGNOSIS — D509 Iron deficiency anemia, unspecified: Secondary | ICD-10-CM | POA: Diagnosis not present

## 2018-03-22 DIAGNOSIS — D631 Anemia in chronic kidney disease: Secondary | ICD-10-CM | POA: Diagnosis not present

## 2018-03-22 DIAGNOSIS — N186 End stage renal disease: Secondary | ICD-10-CM | POA: Diagnosis not present

## 2018-03-23 DIAGNOSIS — D631 Anemia in chronic kidney disease: Secondary | ICD-10-CM | POA: Diagnosis not present

## 2018-03-23 DIAGNOSIS — N2581 Secondary hyperparathyroidism of renal origin: Secondary | ICD-10-CM | POA: Diagnosis not present

## 2018-03-23 DIAGNOSIS — D509 Iron deficiency anemia, unspecified: Secondary | ICD-10-CM | POA: Diagnosis not present

## 2018-03-23 DIAGNOSIS — N186 End stage renal disease: Secondary | ICD-10-CM | POA: Diagnosis not present

## 2018-03-23 DIAGNOSIS — Z992 Dependence on renal dialysis: Secondary | ICD-10-CM | POA: Diagnosis not present

## 2018-03-24 DIAGNOSIS — D631 Anemia in chronic kidney disease: Secondary | ICD-10-CM | POA: Diagnosis not present

## 2018-03-24 DIAGNOSIS — D509 Iron deficiency anemia, unspecified: Secondary | ICD-10-CM | POA: Diagnosis not present

## 2018-03-24 DIAGNOSIS — N2581 Secondary hyperparathyroidism of renal origin: Secondary | ICD-10-CM | POA: Diagnosis not present

## 2018-03-24 DIAGNOSIS — N186 End stage renal disease: Secondary | ICD-10-CM | POA: Diagnosis not present

## 2018-03-24 DIAGNOSIS — Z992 Dependence on renal dialysis: Secondary | ICD-10-CM | POA: Diagnosis not present

## 2018-03-25 DIAGNOSIS — N186 End stage renal disease: Secondary | ICD-10-CM | POA: Diagnosis not present

## 2018-03-25 DIAGNOSIS — Z992 Dependence on renal dialysis: Secondary | ICD-10-CM | POA: Diagnosis not present

## 2018-03-26 DIAGNOSIS — Z992 Dependence on renal dialysis: Secondary | ICD-10-CM | POA: Diagnosis not present

## 2018-03-26 DIAGNOSIS — N186 End stage renal disease: Secondary | ICD-10-CM | POA: Diagnosis not present

## 2018-03-27 DIAGNOSIS — Z992 Dependence on renal dialysis: Secondary | ICD-10-CM | POA: Diagnosis not present

## 2018-03-27 DIAGNOSIS — N186 End stage renal disease: Secondary | ICD-10-CM | POA: Diagnosis not present

## 2018-03-28 DIAGNOSIS — Z992 Dependence on renal dialysis: Secondary | ICD-10-CM | POA: Diagnosis not present

## 2018-03-28 DIAGNOSIS — N186 End stage renal disease: Secondary | ICD-10-CM | POA: Diagnosis not present

## 2018-03-29 DIAGNOSIS — N186 End stage renal disease: Secondary | ICD-10-CM | POA: Diagnosis not present

## 2018-03-29 DIAGNOSIS — Z992 Dependence on renal dialysis: Secondary | ICD-10-CM | POA: Diagnosis not present

## 2018-03-30 DIAGNOSIS — N2581 Secondary hyperparathyroidism of renal origin: Secondary | ICD-10-CM | POA: Diagnosis not present

## 2018-03-30 DIAGNOSIS — Z992 Dependence on renal dialysis: Secondary | ICD-10-CM | POA: Diagnosis not present

## 2018-03-30 DIAGNOSIS — D631 Anemia in chronic kidney disease: Secondary | ICD-10-CM | POA: Diagnosis not present

## 2018-03-30 DIAGNOSIS — N186 End stage renal disease: Secondary | ICD-10-CM | POA: Diagnosis not present

## 2018-03-31 DIAGNOSIS — N186 End stage renal disease: Secondary | ICD-10-CM | POA: Diagnosis not present

## 2018-03-31 DIAGNOSIS — D631 Anemia in chronic kidney disease: Secondary | ICD-10-CM | POA: Diagnosis not present

## 2018-03-31 DIAGNOSIS — Z992 Dependence on renal dialysis: Secondary | ICD-10-CM | POA: Diagnosis not present

## 2018-03-31 DIAGNOSIS — N2581 Secondary hyperparathyroidism of renal origin: Secondary | ICD-10-CM | POA: Diagnosis not present

## 2018-04-01 DIAGNOSIS — N186 End stage renal disease: Secondary | ICD-10-CM | POA: Diagnosis not present

## 2018-04-01 DIAGNOSIS — Z992 Dependence on renal dialysis: Secondary | ICD-10-CM | POA: Diagnosis not present

## 2018-04-01 DIAGNOSIS — N2581 Secondary hyperparathyroidism of renal origin: Secondary | ICD-10-CM | POA: Diagnosis not present

## 2018-04-01 DIAGNOSIS — D631 Anemia in chronic kidney disease: Secondary | ICD-10-CM | POA: Diagnosis not present

## 2018-04-02 DIAGNOSIS — N2581 Secondary hyperparathyroidism of renal origin: Secondary | ICD-10-CM | POA: Diagnosis not present

## 2018-04-02 DIAGNOSIS — D631 Anemia in chronic kidney disease: Secondary | ICD-10-CM | POA: Diagnosis not present

## 2018-04-02 DIAGNOSIS — Z992 Dependence on renal dialysis: Secondary | ICD-10-CM | POA: Diagnosis not present

## 2018-04-02 DIAGNOSIS — N186 End stage renal disease: Secondary | ICD-10-CM | POA: Diagnosis not present

## 2018-04-03 DIAGNOSIS — N2581 Secondary hyperparathyroidism of renal origin: Secondary | ICD-10-CM | POA: Diagnosis not present

## 2018-04-03 DIAGNOSIS — Z992 Dependence on renal dialysis: Secondary | ICD-10-CM | POA: Diagnosis not present

## 2018-04-03 DIAGNOSIS — D631 Anemia in chronic kidney disease: Secondary | ICD-10-CM | POA: Diagnosis not present

## 2018-04-03 DIAGNOSIS — N186 End stage renal disease: Secondary | ICD-10-CM | POA: Diagnosis not present

## 2018-04-04 DIAGNOSIS — N2581 Secondary hyperparathyroidism of renal origin: Secondary | ICD-10-CM | POA: Diagnosis not present

## 2018-04-04 DIAGNOSIS — N186 End stage renal disease: Secondary | ICD-10-CM | POA: Diagnosis not present

## 2018-04-04 DIAGNOSIS — D631 Anemia in chronic kidney disease: Secondary | ICD-10-CM | POA: Diagnosis not present

## 2018-04-04 DIAGNOSIS — Z992 Dependence on renal dialysis: Secondary | ICD-10-CM | POA: Diagnosis not present

## 2018-04-05 DIAGNOSIS — N2581 Secondary hyperparathyroidism of renal origin: Secondary | ICD-10-CM | POA: Diagnosis not present

## 2018-04-05 DIAGNOSIS — Z992 Dependence on renal dialysis: Secondary | ICD-10-CM | POA: Diagnosis not present

## 2018-04-05 DIAGNOSIS — N186 End stage renal disease: Secondary | ICD-10-CM | POA: Diagnosis not present

## 2018-04-05 DIAGNOSIS — D631 Anemia in chronic kidney disease: Secondary | ICD-10-CM | POA: Diagnosis not present

## 2018-04-06 DIAGNOSIS — D631 Anemia in chronic kidney disease: Secondary | ICD-10-CM | POA: Diagnosis not present

## 2018-04-06 DIAGNOSIS — Z992 Dependence on renal dialysis: Secondary | ICD-10-CM | POA: Diagnosis not present

## 2018-04-06 DIAGNOSIS — N186 End stage renal disease: Secondary | ICD-10-CM | POA: Diagnosis not present

## 2018-04-06 DIAGNOSIS — N2581 Secondary hyperparathyroidism of renal origin: Secondary | ICD-10-CM | POA: Diagnosis not present

## 2018-04-07 DIAGNOSIS — Z992 Dependence on renal dialysis: Secondary | ICD-10-CM | POA: Diagnosis not present

## 2018-04-07 DIAGNOSIS — N186 End stage renal disease: Secondary | ICD-10-CM | POA: Diagnosis not present

## 2018-04-07 DIAGNOSIS — D631 Anemia in chronic kidney disease: Secondary | ICD-10-CM | POA: Diagnosis not present

## 2018-04-07 DIAGNOSIS — N2581 Secondary hyperparathyroidism of renal origin: Secondary | ICD-10-CM | POA: Diagnosis not present

## 2018-04-08 DIAGNOSIS — N186 End stage renal disease: Secondary | ICD-10-CM | POA: Diagnosis not present

## 2018-04-08 DIAGNOSIS — Z992 Dependence on renal dialysis: Secondary | ICD-10-CM | POA: Diagnosis not present

## 2018-04-08 DIAGNOSIS — N2581 Secondary hyperparathyroidism of renal origin: Secondary | ICD-10-CM | POA: Diagnosis not present

## 2018-04-08 DIAGNOSIS — D631 Anemia in chronic kidney disease: Secondary | ICD-10-CM | POA: Diagnosis not present

## 2018-04-09 DIAGNOSIS — N2581 Secondary hyperparathyroidism of renal origin: Secondary | ICD-10-CM | POA: Diagnosis not present

## 2018-04-09 DIAGNOSIS — Z992 Dependence on renal dialysis: Secondary | ICD-10-CM | POA: Diagnosis not present

## 2018-04-09 DIAGNOSIS — D631 Anemia in chronic kidney disease: Secondary | ICD-10-CM | POA: Diagnosis not present

## 2018-04-09 DIAGNOSIS — N186 End stage renal disease: Secondary | ICD-10-CM | POA: Diagnosis not present

## 2018-04-10 DIAGNOSIS — N2581 Secondary hyperparathyroidism of renal origin: Secondary | ICD-10-CM | POA: Diagnosis not present

## 2018-04-10 DIAGNOSIS — D631 Anemia in chronic kidney disease: Secondary | ICD-10-CM | POA: Diagnosis not present

## 2018-04-10 DIAGNOSIS — N186 End stage renal disease: Secondary | ICD-10-CM | POA: Diagnosis not present

## 2018-04-10 DIAGNOSIS — Z992 Dependence on renal dialysis: Secondary | ICD-10-CM | POA: Diagnosis not present

## 2018-04-11 DIAGNOSIS — Z992 Dependence on renal dialysis: Secondary | ICD-10-CM | POA: Diagnosis not present

## 2018-04-11 DIAGNOSIS — N2581 Secondary hyperparathyroidism of renal origin: Secondary | ICD-10-CM | POA: Diagnosis not present

## 2018-04-11 DIAGNOSIS — D631 Anemia in chronic kidney disease: Secondary | ICD-10-CM | POA: Diagnosis not present

## 2018-04-11 DIAGNOSIS — N186 End stage renal disease: Secondary | ICD-10-CM | POA: Diagnosis not present

## 2018-04-12 DIAGNOSIS — D631 Anemia in chronic kidney disease: Secondary | ICD-10-CM | POA: Diagnosis not present

## 2018-04-12 DIAGNOSIS — N2581 Secondary hyperparathyroidism of renal origin: Secondary | ICD-10-CM | POA: Diagnosis not present

## 2018-04-12 DIAGNOSIS — N186 End stage renal disease: Secondary | ICD-10-CM | POA: Diagnosis not present

## 2018-04-12 DIAGNOSIS — Z992 Dependence on renal dialysis: Secondary | ICD-10-CM | POA: Diagnosis not present

## 2018-04-13 DIAGNOSIS — N2581 Secondary hyperparathyroidism of renal origin: Secondary | ICD-10-CM | POA: Diagnosis not present

## 2018-04-13 DIAGNOSIS — N186 End stage renal disease: Secondary | ICD-10-CM | POA: Diagnosis not present

## 2018-04-13 DIAGNOSIS — Z992 Dependence on renal dialysis: Secondary | ICD-10-CM | POA: Diagnosis not present

## 2018-04-13 DIAGNOSIS — D631 Anemia in chronic kidney disease: Secondary | ICD-10-CM | POA: Diagnosis not present

## 2018-04-14 DIAGNOSIS — Z992 Dependence on renal dialysis: Secondary | ICD-10-CM | POA: Diagnosis not present

## 2018-04-14 DIAGNOSIS — N2581 Secondary hyperparathyroidism of renal origin: Secondary | ICD-10-CM | POA: Diagnosis not present

## 2018-04-14 DIAGNOSIS — D631 Anemia in chronic kidney disease: Secondary | ICD-10-CM | POA: Diagnosis not present

## 2018-04-14 DIAGNOSIS — N186 End stage renal disease: Secondary | ICD-10-CM | POA: Diagnosis not present

## 2018-04-15 DIAGNOSIS — N2581 Secondary hyperparathyroidism of renal origin: Secondary | ICD-10-CM | POA: Diagnosis not present

## 2018-04-15 DIAGNOSIS — D631 Anemia in chronic kidney disease: Secondary | ICD-10-CM | POA: Diagnosis not present

## 2018-04-15 DIAGNOSIS — N186 End stage renal disease: Secondary | ICD-10-CM | POA: Diagnosis not present

## 2018-04-15 DIAGNOSIS — Z992 Dependence on renal dialysis: Secondary | ICD-10-CM | POA: Diagnosis not present

## 2018-04-16 DIAGNOSIS — N186 End stage renal disease: Secondary | ICD-10-CM | POA: Diagnosis not present

## 2018-04-16 DIAGNOSIS — Z992 Dependence on renal dialysis: Secondary | ICD-10-CM | POA: Diagnosis not present

## 2018-04-16 DIAGNOSIS — N2581 Secondary hyperparathyroidism of renal origin: Secondary | ICD-10-CM | POA: Diagnosis not present

## 2018-04-16 DIAGNOSIS — D631 Anemia in chronic kidney disease: Secondary | ICD-10-CM | POA: Diagnosis not present

## 2018-04-17 DIAGNOSIS — D631 Anemia in chronic kidney disease: Secondary | ICD-10-CM | POA: Diagnosis not present

## 2018-04-17 DIAGNOSIS — Z992 Dependence on renal dialysis: Secondary | ICD-10-CM | POA: Diagnosis not present

## 2018-04-17 DIAGNOSIS — N186 End stage renal disease: Secondary | ICD-10-CM | POA: Diagnosis not present

## 2018-04-17 DIAGNOSIS — N2581 Secondary hyperparathyroidism of renal origin: Secondary | ICD-10-CM | POA: Diagnosis not present

## 2018-04-18 DIAGNOSIS — D631 Anemia in chronic kidney disease: Secondary | ICD-10-CM | POA: Diagnosis not present

## 2018-04-18 DIAGNOSIS — N186 End stage renal disease: Secondary | ICD-10-CM | POA: Diagnosis not present

## 2018-04-18 DIAGNOSIS — Z992 Dependence on renal dialysis: Secondary | ICD-10-CM | POA: Diagnosis not present

## 2018-04-18 DIAGNOSIS — N2581 Secondary hyperparathyroidism of renal origin: Secondary | ICD-10-CM | POA: Diagnosis not present

## 2018-04-19 DIAGNOSIS — N2581 Secondary hyperparathyroidism of renal origin: Secondary | ICD-10-CM | POA: Diagnosis not present

## 2018-04-19 DIAGNOSIS — N186 End stage renal disease: Secondary | ICD-10-CM | POA: Diagnosis not present

## 2018-04-19 DIAGNOSIS — Z992 Dependence on renal dialysis: Secondary | ICD-10-CM | POA: Diagnosis not present

## 2018-04-19 DIAGNOSIS — D631 Anemia in chronic kidney disease: Secondary | ICD-10-CM | POA: Diagnosis not present

## 2018-04-20 DIAGNOSIS — N186 End stage renal disease: Secondary | ICD-10-CM | POA: Diagnosis not present

## 2018-04-20 DIAGNOSIS — N2581 Secondary hyperparathyroidism of renal origin: Secondary | ICD-10-CM | POA: Diagnosis not present

## 2018-04-20 DIAGNOSIS — D631 Anemia in chronic kidney disease: Secondary | ICD-10-CM | POA: Diagnosis not present

## 2018-04-20 DIAGNOSIS — Z992 Dependence on renal dialysis: Secondary | ICD-10-CM | POA: Diagnosis not present

## 2018-04-21 DIAGNOSIS — N186 End stage renal disease: Secondary | ICD-10-CM | POA: Diagnosis not present

## 2018-04-21 DIAGNOSIS — N2581 Secondary hyperparathyroidism of renal origin: Secondary | ICD-10-CM | POA: Diagnosis not present

## 2018-04-21 DIAGNOSIS — D631 Anemia in chronic kidney disease: Secondary | ICD-10-CM | POA: Diagnosis not present

## 2018-04-21 DIAGNOSIS — Z992 Dependence on renal dialysis: Secondary | ICD-10-CM | POA: Diagnosis not present

## 2018-04-22 DIAGNOSIS — N2581 Secondary hyperparathyroidism of renal origin: Secondary | ICD-10-CM | POA: Diagnosis not present

## 2018-04-22 DIAGNOSIS — D631 Anemia in chronic kidney disease: Secondary | ICD-10-CM | POA: Diagnosis not present

## 2018-04-22 DIAGNOSIS — Z992 Dependence on renal dialysis: Secondary | ICD-10-CM | POA: Diagnosis not present

## 2018-04-22 DIAGNOSIS — N186 End stage renal disease: Secondary | ICD-10-CM | POA: Diagnosis not present

## 2018-04-23 DIAGNOSIS — N2581 Secondary hyperparathyroidism of renal origin: Secondary | ICD-10-CM | POA: Diagnosis not present

## 2018-04-23 DIAGNOSIS — D631 Anemia in chronic kidney disease: Secondary | ICD-10-CM | POA: Diagnosis not present

## 2018-04-23 DIAGNOSIS — N186 End stage renal disease: Secondary | ICD-10-CM | POA: Diagnosis not present

## 2018-04-23 DIAGNOSIS — Z992 Dependence on renal dialysis: Secondary | ICD-10-CM | POA: Diagnosis not present

## 2018-04-24 DIAGNOSIS — N2581 Secondary hyperparathyroidism of renal origin: Secondary | ICD-10-CM | POA: Diagnosis not present

## 2018-04-24 DIAGNOSIS — N186 End stage renal disease: Secondary | ICD-10-CM | POA: Diagnosis not present

## 2018-04-24 DIAGNOSIS — D631 Anemia in chronic kidney disease: Secondary | ICD-10-CM | POA: Diagnosis not present

## 2018-04-24 DIAGNOSIS — Z992 Dependence on renal dialysis: Secondary | ICD-10-CM | POA: Diagnosis not present

## 2018-04-25 DIAGNOSIS — Z992 Dependence on renal dialysis: Secondary | ICD-10-CM | POA: Diagnosis not present

## 2018-04-25 DIAGNOSIS — D631 Anemia in chronic kidney disease: Secondary | ICD-10-CM | POA: Diagnosis not present

## 2018-04-25 DIAGNOSIS — N2581 Secondary hyperparathyroidism of renal origin: Secondary | ICD-10-CM | POA: Diagnosis not present

## 2018-04-25 DIAGNOSIS — N186 End stage renal disease: Secondary | ICD-10-CM | POA: Diagnosis not present

## 2018-04-26 DIAGNOSIS — Z992 Dependence on renal dialysis: Secondary | ICD-10-CM | POA: Diagnosis not present

## 2018-04-26 DIAGNOSIS — N2581 Secondary hyperparathyroidism of renal origin: Secondary | ICD-10-CM | POA: Diagnosis not present

## 2018-04-26 DIAGNOSIS — N186 End stage renal disease: Secondary | ICD-10-CM | POA: Diagnosis not present

## 2018-04-26 DIAGNOSIS — D631 Anemia in chronic kidney disease: Secondary | ICD-10-CM | POA: Diagnosis not present

## 2018-04-27 DIAGNOSIS — D631 Anemia in chronic kidney disease: Secondary | ICD-10-CM | POA: Diagnosis not present

## 2018-04-27 DIAGNOSIS — N2581 Secondary hyperparathyroidism of renal origin: Secondary | ICD-10-CM | POA: Diagnosis not present

## 2018-04-27 DIAGNOSIS — Z992 Dependence on renal dialysis: Secondary | ICD-10-CM | POA: Diagnosis not present

## 2018-04-27 DIAGNOSIS — N186 End stage renal disease: Secondary | ICD-10-CM | POA: Diagnosis not present

## 2018-04-28 DIAGNOSIS — N2581 Secondary hyperparathyroidism of renal origin: Secondary | ICD-10-CM | POA: Diagnosis not present

## 2018-04-28 DIAGNOSIS — N186 End stage renal disease: Secondary | ICD-10-CM | POA: Diagnosis not present

## 2018-04-28 DIAGNOSIS — D631 Anemia in chronic kidney disease: Secondary | ICD-10-CM | POA: Diagnosis not present

## 2018-04-28 DIAGNOSIS — Z992 Dependence on renal dialysis: Secondary | ICD-10-CM | POA: Diagnosis not present

## 2018-04-29 DIAGNOSIS — D631 Anemia in chronic kidney disease: Secondary | ICD-10-CM | POA: Diagnosis not present

## 2018-04-29 DIAGNOSIS — Z992 Dependence on renal dialysis: Secondary | ICD-10-CM | POA: Diagnosis not present

## 2018-04-29 DIAGNOSIS — N186 End stage renal disease: Secondary | ICD-10-CM | POA: Diagnosis not present

## 2018-04-29 DIAGNOSIS — N2581 Secondary hyperparathyroidism of renal origin: Secondary | ICD-10-CM | POA: Diagnosis not present

## 2018-04-30 DIAGNOSIS — D631 Anemia in chronic kidney disease: Secondary | ICD-10-CM | POA: Diagnosis not present

## 2018-04-30 DIAGNOSIS — N186 End stage renal disease: Secondary | ICD-10-CM | POA: Diagnosis not present

## 2018-04-30 DIAGNOSIS — Z992 Dependence on renal dialysis: Secondary | ICD-10-CM | POA: Diagnosis not present

## 2018-04-30 DIAGNOSIS — D509 Iron deficiency anemia, unspecified: Secondary | ICD-10-CM | POA: Diagnosis not present

## 2018-04-30 DIAGNOSIS — N2581 Secondary hyperparathyroidism of renal origin: Secondary | ICD-10-CM | POA: Diagnosis not present

## 2018-05-01 DIAGNOSIS — D631 Anemia in chronic kidney disease: Secondary | ICD-10-CM | POA: Diagnosis not present

## 2018-05-01 DIAGNOSIS — D509 Iron deficiency anemia, unspecified: Secondary | ICD-10-CM | POA: Diagnosis not present

## 2018-05-01 DIAGNOSIS — N2581 Secondary hyperparathyroidism of renal origin: Secondary | ICD-10-CM | POA: Diagnosis not present

## 2018-05-01 DIAGNOSIS — N186 End stage renal disease: Secondary | ICD-10-CM | POA: Diagnosis not present

## 2018-05-01 DIAGNOSIS — Z992 Dependence on renal dialysis: Secondary | ICD-10-CM | POA: Diagnosis not present

## 2018-05-02 DIAGNOSIS — D509 Iron deficiency anemia, unspecified: Secondary | ICD-10-CM | POA: Diagnosis not present

## 2018-05-02 DIAGNOSIS — Z992 Dependence on renal dialysis: Secondary | ICD-10-CM | POA: Diagnosis not present

## 2018-05-02 DIAGNOSIS — N2581 Secondary hyperparathyroidism of renal origin: Secondary | ICD-10-CM | POA: Diagnosis not present

## 2018-05-02 DIAGNOSIS — D631 Anemia in chronic kidney disease: Secondary | ICD-10-CM | POA: Diagnosis not present

## 2018-05-02 DIAGNOSIS — N186 End stage renal disease: Secondary | ICD-10-CM | POA: Diagnosis not present

## 2018-05-03 DIAGNOSIS — Z992 Dependence on renal dialysis: Secondary | ICD-10-CM | POA: Diagnosis not present

## 2018-05-03 DIAGNOSIS — D631 Anemia in chronic kidney disease: Secondary | ICD-10-CM | POA: Diagnosis not present

## 2018-05-03 DIAGNOSIS — N186 End stage renal disease: Secondary | ICD-10-CM | POA: Diagnosis not present

## 2018-05-03 DIAGNOSIS — D509 Iron deficiency anemia, unspecified: Secondary | ICD-10-CM | POA: Diagnosis not present

## 2018-05-03 DIAGNOSIS — N2581 Secondary hyperparathyroidism of renal origin: Secondary | ICD-10-CM | POA: Diagnosis not present

## 2018-05-04 DIAGNOSIS — N2581 Secondary hyperparathyroidism of renal origin: Secondary | ICD-10-CM | POA: Diagnosis not present

## 2018-05-04 DIAGNOSIS — D631 Anemia in chronic kidney disease: Secondary | ICD-10-CM | POA: Diagnosis not present

## 2018-05-04 DIAGNOSIS — Z992 Dependence on renal dialysis: Secondary | ICD-10-CM | POA: Diagnosis not present

## 2018-05-04 DIAGNOSIS — N186 End stage renal disease: Secondary | ICD-10-CM | POA: Diagnosis not present

## 2018-05-04 DIAGNOSIS — D509 Iron deficiency anemia, unspecified: Secondary | ICD-10-CM | POA: Diagnosis not present

## 2018-05-05 DIAGNOSIS — N186 End stage renal disease: Secondary | ICD-10-CM | POA: Diagnosis not present

## 2018-05-05 DIAGNOSIS — N2581 Secondary hyperparathyroidism of renal origin: Secondary | ICD-10-CM | POA: Diagnosis not present

## 2018-05-05 DIAGNOSIS — D509 Iron deficiency anemia, unspecified: Secondary | ICD-10-CM | POA: Diagnosis not present

## 2018-05-05 DIAGNOSIS — D631 Anemia in chronic kidney disease: Secondary | ICD-10-CM | POA: Diagnosis not present

## 2018-05-05 DIAGNOSIS — Z992 Dependence on renal dialysis: Secondary | ICD-10-CM | POA: Diagnosis not present

## 2018-05-06 DIAGNOSIS — N186 End stage renal disease: Secondary | ICD-10-CM | POA: Diagnosis not present

## 2018-05-06 DIAGNOSIS — N2581 Secondary hyperparathyroidism of renal origin: Secondary | ICD-10-CM | POA: Diagnosis not present

## 2018-05-06 DIAGNOSIS — Z992 Dependence on renal dialysis: Secondary | ICD-10-CM | POA: Diagnosis not present

## 2018-05-06 DIAGNOSIS — D631 Anemia in chronic kidney disease: Secondary | ICD-10-CM | POA: Diagnosis not present

## 2018-05-06 DIAGNOSIS — D509 Iron deficiency anemia, unspecified: Secondary | ICD-10-CM | POA: Diagnosis not present

## 2018-05-07 DIAGNOSIS — Z992 Dependence on renal dialysis: Secondary | ICD-10-CM | POA: Diagnosis not present

## 2018-05-07 DIAGNOSIS — D631 Anemia in chronic kidney disease: Secondary | ICD-10-CM | POA: Diagnosis not present

## 2018-05-07 DIAGNOSIS — N2581 Secondary hyperparathyroidism of renal origin: Secondary | ICD-10-CM | POA: Diagnosis not present

## 2018-05-07 DIAGNOSIS — N186 End stage renal disease: Secondary | ICD-10-CM | POA: Diagnosis not present

## 2018-05-07 DIAGNOSIS — D509 Iron deficiency anemia, unspecified: Secondary | ICD-10-CM | POA: Diagnosis not present

## 2018-05-08 DIAGNOSIS — Z992 Dependence on renal dialysis: Secondary | ICD-10-CM | POA: Diagnosis not present

## 2018-05-08 DIAGNOSIS — D509 Iron deficiency anemia, unspecified: Secondary | ICD-10-CM | POA: Diagnosis not present

## 2018-05-08 DIAGNOSIS — N186 End stage renal disease: Secondary | ICD-10-CM | POA: Diagnosis not present

## 2018-05-08 DIAGNOSIS — D631 Anemia in chronic kidney disease: Secondary | ICD-10-CM | POA: Diagnosis not present

## 2018-05-08 DIAGNOSIS — N2581 Secondary hyperparathyroidism of renal origin: Secondary | ICD-10-CM | POA: Diagnosis not present

## 2018-05-09 DIAGNOSIS — N2581 Secondary hyperparathyroidism of renal origin: Secondary | ICD-10-CM | POA: Diagnosis not present

## 2018-05-09 DIAGNOSIS — Z992 Dependence on renal dialysis: Secondary | ICD-10-CM | POA: Diagnosis not present

## 2018-05-09 DIAGNOSIS — N186 End stage renal disease: Secondary | ICD-10-CM | POA: Diagnosis not present

## 2018-05-09 DIAGNOSIS — D509 Iron deficiency anemia, unspecified: Secondary | ICD-10-CM | POA: Diagnosis not present

## 2018-05-09 DIAGNOSIS — D631 Anemia in chronic kidney disease: Secondary | ICD-10-CM | POA: Diagnosis not present

## 2018-05-10 DIAGNOSIS — N2581 Secondary hyperparathyroidism of renal origin: Secondary | ICD-10-CM | POA: Diagnosis not present

## 2018-05-10 DIAGNOSIS — D509 Iron deficiency anemia, unspecified: Secondary | ICD-10-CM | POA: Diagnosis not present

## 2018-05-10 DIAGNOSIS — Z992 Dependence on renal dialysis: Secondary | ICD-10-CM | POA: Diagnosis not present

## 2018-05-10 DIAGNOSIS — D631 Anemia in chronic kidney disease: Secondary | ICD-10-CM | POA: Diagnosis not present

## 2018-05-10 DIAGNOSIS — N186 End stage renal disease: Secondary | ICD-10-CM | POA: Diagnosis not present

## 2018-05-11 DIAGNOSIS — D631 Anemia in chronic kidney disease: Secondary | ICD-10-CM | POA: Diagnosis not present

## 2018-05-11 DIAGNOSIS — N186 End stage renal disease: Secondary | ICD-10-CM | POA: Diagnosis not present

## 2018-05-11 DIAGNOSIS — D509 Iron deficiency anemia, unspecified: Secondary | ICD-10-CM | POA: Diagnosis not present

## 2018-05-11 DIAGNOSIS — N2581 Secondary hyperparathyroidism of renal origin: Secondary | ICD-10-CM | POA: Diagnosis not present

## 2018-05-11 DIAGNOSIS — Z992 Dependence on renal dialysis: Secondary | ICD-10-CM | POA: Diagnosis not present

## 2018-05-12 DIAGNOSIS — Z992 Dependence on renal dialysis: Secondary | ICD-10-CM | POA: Diagnosis not present

## 2018-05-12 DIAGNOSIS — D509 Iron deficiency anemia, unspecified: Secondary | ICD-10-CM | POA: Diagnosis not present

## 2018-05-12 DIAGNOSIS — N186 End stage renal disease: Secondary | ICD-10-CM | POA: Diagnosis not present

## 2018-05-12 DIAGNOSIS — D631 Anemia in chronic kidney disease: Secondary | ICD-10-CM | POA: Diagnosis not present

## 2018-05-12 DIAGNOSIS — N2581 Secondary hyperparathyroidism of renal origin: Secondary | ICD-10-CM | POA: Diagnosis not present

## 2018-05-13 DIAGNOSIS — N186 End stage renal disease: Secondary | ICD-10-CM | POA: Diagnosis not present

## 2018-05-13 DIAGNOSIS — Z992 Dependence on renal dialysis: Secondary | ICD-10-CM | POA: Diagnosis not present

## 2018-05-13 DIAGNOSIS — D631 Anemia in chronic kidney disease: Secondary | ICD-10-CM | POA: Diagnosis not present

## 2018-05-13 DIAGNOSIS — N2581 Secondary hyperparathyroidism of renal origin: Secondary | ICD-10-CM | POA: Diagnosis not present

## 2018-05-13 DIAGNOSIS — D509 Iron deficiency anemia, unspecified: Secondary | ICD-10-CM | POA: Diagnosis not present

## 2018-05-14 DIAGNOSIS — D631 Anemia in chronic kidney disease: Secondary | ICD-10-CM | POA: Diagnosis not present

## 2018-05-14 DIAGNOSIS — N186 End stage renal disease: Secondary | ICD-10-CM | POA: Diagnosis not present

## 2018-05-14 DIAGNOSIS — N2581 Secondary hyperparathyroidism of renal origin: Secondary | ICD-10-CM | POA: Diagnosis not present

## 2018-05-14 DIAGNOSIS — D509 Iron deficiency anemia, unspecified: Secondary | ICD-10-CM | POA: Diagnosis not present

## 2018-05-14 DIAGNOSIS — Z992 Dependence on renal dialysis: Secondary | ICD-10-CM | POA: Diagnosis not present

## 2018-05-15 DIAGNOSIS — Z992 Dependence on renal dialysis: Secondary | ICD-10-CM | POA: Diagnosis not present

## 2018-05-15 DIAGNOSIS — D509 Iron deficiency anemia, unspecified: Secondary | ICD-10-CM | POA: Diagnosis not present

## 2018-05-15 DIAGNOSIS — N2581 Secondary hyperparathyroidism of renal origin: Secondary | ICD-10-CM | POA: Diagnosis not present

## 2018-05-15 DIAGNOSIS — N186 End stage renal disease: Secondary | ICD-10-CM | POA: Diagnosis not present

## 2018-05-15 DIAGNOSIS — D631 Anemia in chronic kidney disease: Secondary | ICD-10-CM | POA: Diagnosis not present

## 2018-05-16 DIAGNOSIS — Z992 Dependence on renal dialysis: Secondary | ICD-10-CM | POA: Diagnosis not present

## 2018-05-16 DIAGNOSIS — D631 Anemia in chronic kidney disease: Secondary | ICD-10-CM | POA: Diagnosis not present

## 2018-05-16 DIAGNOSIS — N186 End stage renal disease: Secondary | ICD-10-CM | POA: Diagnosis not present

## 2018-05-16 DIAGNOSIS — D509 Iron deficiency anemia, unspecified: Secondary | ICD-10-CM | POA: Diagnosis not present

## 2018-05-16 DIAGNOSIS — N2581 Secondary hyperparathyroidism of renal origin: Secondary | ICD-10-CM | POA: Diagnosis not present

## 2018-05-17 DIAGNOSIS — D509 Iron deficiency anemia, unspecified: Secondary | ICD-10-CM | POA: Diagnosis not present

## 2018-05-17 DIAGNOSIS — N186 End stage renal disease: Secondary | ICD-10-CM | POA: Diagnosis not present

## 2018-05-17 DIAGNOSIS — N2581 Secondary hyperparathyroidism of renal origin: Secondary | ICD-10-CM | POA: Diagnosis not present

## 2018-05-17 DIAGNOSIS — D631 Anemia in chronic kidney disease: Secondary | ICD-10-CM | POA: Diagnosis not present

## 2018-05-17 DIAGNOSIS — Z992 Dependence on renal dialysis: Secondary | ICD-10-CM | POA: Diagnosis not present

## 2018-05-18 DIAGNOSIS — N186 End stage renal disease: Secondary | ICD-10-CM | POA: Diagnosis not present

## 2018-05-18 DIAGNOSIS — D509 Iron deficiency anemia, unspecified: Secondary | ICD-10-CM | POA: Diagnosis not present

## 2018-05-18 DIAGNOSIS — D631 Anemia in chronic kidney disease: Secondary | ICD-10-CM | POA: Diagnosis not present

## 2018-05-18 DIAGNOSIS — Z992 Dependence on renal dialysis: Secondary | ICD-10-CM | POA: Diagnosis not present

## 2018-05-18 DIAGNOSIS — N2581 Secondary hyperparathyroidism of renal origin: Secondary | ICD-10-CM | POA: Diagnosis not present

## 2018-05-19 DIAGNOSIS — N186 End stage renal disease: Secondary | ICD-10-CM | POA: Diagnosis not present

## 2018-05-19 DIAGNOSIS — D631 Anemia in chronic kidney disease: Secondary | ICD-10-CM | POA: Diagnosis not present

## 2018-05-19 DIAGNOSIS — N2581 Secondary hyperparathyroidism of renal origin: Secondary | ICD-10-CM | POA: Diagnosis not present

## 2018-05-19 DIAGNOSIS — D509 Iron deficiency anemia, unspecified: Secondary | ICD-10-CM | POA: Diagnosis not present

## 2018-05-19 DIAGNOSIS — Z992 Dependence on renal dialysis: Secondary | ICD-10-CM | POA: Diagnosis not present

## 2018-05-20 DIAGNOSIS — N2581 Secondary hyperparathyroidism of renal origin: Secondary | ICD-10-CM | POA: Diagnosis not present

## 2018-05-20 DIAGNOSIS — D631 Anemia in chronic kidney disease: Secondary | ICD-10-CM | POA: Diagnosis not present

## 2018-05-20 DIAGNOSIS — I10 Essential (primary) hypertension: Secondary | ICD-10-CM | POA: Diagnosis not present

## 2018-05-20 DIAGNOSIS — R609 Edema, unspecified: Secondary | ICD-10-CM | POA: Diagnosis not present

## 2018-05-20 DIAGNOSIS — D509 Iron deficiency anemia, unspecified: Secondary | ICD-10-CM | POA: Diagnosis not present

## 2018-05-20 DIAGNOSIS — N185 Chronic kidney disease, stage 5: Secondary | ICD-10-CM | POA: Diagnosis not present

## 2018-05-20 DIAGNOSIS — N186 End stage renal disease: Secondary | ICD-10-CM | POA: Diagnosis not present

## 2018-05-20 DIAGNOSIS — Z992 Dependence on renal dialysis: Secondary | ICD-10-CM | POA: Diagnosis not present

## 2018-05-21 DIAGNOSIS — N186 End stage renal disease: Secondary | ICD-10-CM | POA: Diagnosis not present

## 2018-05-21 DIAGNOSIS — N2581 Secondary hyperparathyroidism of renal origin: Secondary | ICD-10-CM | POA: Diagnosis not present

## 2018-05-21 DIAGNOSIS — D631 Anemia in chronic kidney disease: Secondary | ICD-10-CM | POA: Diagnosis not present

## 2018-05-21 DIAGNOSIS — D509 Iron deficiency anemia, unspecified: Secondary | ICD-10-CM | POA: Diagnosis not present

## 2018-05-21 DIAGNOSIS — Z992 Dependence on renal dialysis: Secondary | ICD-10-CM | POA: Diagnosis not present

## 2018-05-22 DIAGNOSIS — Z992 Dependence on renal dialysis: Secondary | ICD-10-CM | POA: Diagnosis not present

## 2018-05-22 DIAGNOSIS — N2581 Secondary hyperparathyroidism of renal origin: Secondary | ICD-10-CM | POA: Diagnosis not present

## 2018-05-22 DIAGNOSIS — D631 Anemia in chronic kidney disease: Secondary | ICD-10-CM | POA: Diagnosis not present

## 2018-05-22 DIAGNOSIS — N186 End stage renal disease: Secondary | ICD-10-CM | POA: Diagnosis not present

## 2018-05-22 DIAGNOSIS — D509 Iron deficiency anemia, unspecified: Secondary | ICD-10-CM | POA: Diagnosis not present

## 2018-05-23 DIAGNOSIS — N2581 Secondary hyperparathyroidism of renal origin: Secondary | ICD-10-CM | POA: Diagnosis not present

## 2018-05-23 DIAGNOSIS — D509 Iron deficiency anemia, unspecified: Secondary | ICD-10-CM | POA: Diagnosis not present

## 2018-05-23 DIAGNOSIS — N186 End stage renal disease: Secondary | ICD-10-CM | POA: Diagnosis not present

## 2018-05-23 DIAGNOSIS — D631 Anemia in chronic kidney disease: Secondary | ICD-10-CM | POA: Diagnosis not present

## 2018-05-23 DIAGNOSIS — Z992 Dependence on renal dialysis: Secondary | ICD-10-CM | POA: Diagnosis not present

## 2018-05-24 DIAGNOSIS — N2581 Secondary hyperparathyroidism of renal origin: Secondary | ICD-10-CM | POA: Diagnosis not present

## 2018-05-24 DIAGNOSIS — D631 Anemia in chronic kidney disease: Secondary | ICD-10-CM | POA: Diagnosis not present

## 2018-05-24 DIAGNOSIS — Z992 Dependence on renal dialysis: Secondary | ICD-10-CM | POA: Diagnosis not present

## 2018-05-24 DIAGNOSIS — N186 End stage renal disease: Secondary | ICD-10-CM | POA: Diagnosis not present

## 2018-05-24 DIAGNOSIS — D509 Iron deficiency anemia, unspecified: Secondary | ICD-10-CM | POA: Diagnosis not present

## 2018-05-25 DIAGNOSIS — N2581 Secondary hyperparathyroidism of renal origin: Secondary | ICD-10-CM | POA: Diagnosis not present

## 2018-05-25 DIAGNOSIS — Z992 Dependence on renal dialysis: Secondary | ICD-10-CM | POA: Diagnosis not present

## 2018-05-25 DIAGNOSIS — N186 End stage renal disease: Secondary | ICD-10-CM | POA: Diagnosis not present

## 2018-05-25 DIAGNOSIS — D631 Anemia in chronic kidney disease: Secondary | ICD-10-CM | POA: Diagnosis not present

## 2018-05-25 DIAGNOSIS — D509 Iron deficiency anemia, unspecified: Secondary | ICD-10-CM | POA: Diagnosis not present

## 2018-05-26 DIAGNOSIS — N2581 Secondary hyperparathyroidism of renal origin: Secondary | ICD-10-CM | POA: Diagnosis not present

## 2018-05-26 DIAGNOSIS — N186 End stage renal disease: Secondary | ICD-10-CM | POA: Diagnosis not present

## 2018-05-26 DIAGNOSIS — D631 Anemia in chronic kidney disease: Secondary | ICD-10-CM | POA: Diagnosis not present

## 2018-05-26 DIAGNOSIS — Z992 Dependence on renal dialysis: Secondary | ICD-10-CM | POA: Diagnosis not present

## 2018-05-26 DIAGNOSIS — D509 Iron deficiency anemia, unspecified: Secondary | ICD-10-CM | POA: Diagnosis not present

## 2018-05-27 DIAGNOSIS — N2581 Secondary hyperparathyroidism of renal origin: Secondary | ICD-10-CM | POA: Diagnosis not present

## 2018-05-27 DIAGNOSIS — D631 Anemia in chronic kidney disease: Secondary | ICD-10-CM | POA: Diagnosis not present

## 2018-05-27 DIAGNOSIS — Z992 Dependence on renal dialysis: Secondary | ICD-10-CM | POA: Diagnosis not present

## 2018-05-27 DIAGNOSIS — D509 Iron deficiency anemia, unspecified: Secondary | ICD-10-CM | POA: Diagnosis not present

## 2018-05-27 DIAGNOSIS — N186 End stage renal disease: Secondary | ICD-10-CM | POA: Diagnosis not present

## 2018-05-28 DIAGNOSIS — N186 End stage renal disease: Secondary | ICD-10-CM | POA: Diagnosis not present

## 2018-05-28 DIAGNOSIS — Z992 Dependence on renal dialysis: Secondary | ICD-10-CM | POA: Diagnosis not present

## 2018-05-28 DIAGNOSIS — D509 Iron deficiency anemia, unspecified: Secondary | ICD-10-CM | POA: Diagnosis not present

## 2018-05-28 DIAGNOSIS — N2581 Secondary hyperparathyroidism of renal origin: Secondary | ICD-10-CM | POA: Diagnosis not present

## 2018-05-28 DIAGNOSIS — D631 Anemia in chronic kidney disease: Secondary | ICD-10-CM | POA: Diagnosis not present

## 2018-05-29 DIAGNOSIS — Z992 Dependence on renal dialysis: Secondary | ICD-10-CM | POA: Diagnosis not present

## 2018-05-29 DIAGNOSIS — N186 End stage renal disease: Secondary | ICD-10-CM | POA: Diagnosis not present

## 2018-05-29 DIAGNOSIS — N2581 Secondary hyperparathyroidism of renal origin: Secondary | ICD-10-CM | POA: Diagnosis not present

## 2018-05-29 DIAGNOSIS — D631 Anemia in chronic kidney disease: Secondary | ICD-10-CM | POA: Diagnosis not present

## 2018-05-29 DIAGNOSIS — D509 Iron deficiency anemia, unspecified: Secondary | ICD-10-CM | POA: Diagnosis not present

## 2018-05-30 DIAGNOSIS — D509 Iron deficiency anemia, unspecified: Secondary | ICD-10-CM | POA: Diagnosis not present

## 2018-05-30 DIAGNOSIS — N186 End stage renal disease: Secondary | ICD-10-CM | POA: Diagnosis not present

## 2018-05-30 DIAGNOSIS — N2581 Secondary hyperparathyroidism of renal origin: Secondary | ICD-10-CM | POA: Diagnosis not present

## 2018-05-30 DIAGNOSIS — D631 Anemia in chronic kidney disease: Secondary | ICD-10-CM | POA: Diagnosis not present

## 2018-05-30 DIAGNOSIS — Z992 Dependence on renal dialysis: Secondary | ICD-10-CM | POA: Diagnosis not present

## 2018-05-31 DIAGNOSIS — D631 Anemia in chronic kidney disease: Secondary | ICD-10-CM | POA: Diagnosis not present

## 2018-05-31 DIAGNOSIS — N186 End stage renal disease: Secondary | ICD-10-CM | POA: Diagnosis not present

## 2018-05-31 DIAGNOSIS — D509 Iron deficiency anemia, unspecified: Secondary | ICD-10-CM | POA: Diagnosis not present

## 2018-05-31 DIAGNOSIS — N2581 Secondary hyperparathyroidism of renal origin: Secondary | ICD-10-CM | POA: Diagnosis not present

## 2018-05-31 DIAGNOSIS — Z992 Dependence on renal dialysis: Secondary | ICD-10-CM | POA: Diagnosis not present

## 2018-06-01 DIAGNOSIS — Z992 Dependence on renal dialysis: Secondary | ICD-10-CM | POA: Diagnosis not present

## 2018-06-01 DIAGNOSIS — D631 Anemia in chronic kidney disease: Secondary | ICD-10-CM | POA: Diagnosis not present

## 2018-06-01 DIAGNOSIS — N2581 Secondary hyperparathyroidism of renal origin: Secondary | ICD-10-CM | POA: Diagnosis not present

## 2018-06-01 DIAGNOSIS — D509 Iron deficiency anemia, unspecified: Secondary | ICD-10-CM | POA: Diagnosis not present

## 2018-06-01 DIAGNOSIS — N186 End stage renal disease: Secondary | ICD-10-CM | POA: Diagnosis not present

## 2018-06-02 DIAGNOSIS — D631 Anemia in chronic kidney disease: Secondary | ICD-10-CM | POA: Diagnosis not present

## 2018-06-02 DIAGNOSIS — N186 End stage renal disease: Secondary | ICD-10-CM | POA: Diagnosis not present

## 2018-06-02 DIAGNOSIS — N2581 Secondary hyperparathyroidism of renal origin: Secondary | ICD-10-CM | POA: Diagnosis not present

## 2018-06-02 DIAGNOSIS — D509 Iron deficiency anemia, unspecified: Secondary | ICD-10-CM | POA: Diagnosis not present

## 2018-06-02 DIAGNOSIS — Z992 Dependence on renal dialysis: Secondary | ICD-10-CM | POA: Diagnosis not present

## 2018-06-03 DIAGNOSIS — N2581 Secondary hyperparathyroidism of renal origin: Secondary | ICD-10-CM | POA: Diagnosis not present

## 2018-06-03 DIAGNOSIS — D631 Anemia in chronic kidney disease: Secondary | ICD-10-CM | POA: Diagnosis not present

## 2018-06-03 DIAGNOSIS — D509 Iron deficiency anemia, unspecified: Secondary | ICD-10-CM | POA: Diagnosis not present

## 2018-06-03 DIAGNOSIS — N186 End stage renal disease: Secondary | ICD-10-CM | POA: Diagnosis not present

## 2018-06-03 DIAGNOSIS — Z992 Dependence on renal dialysis: Secondary | ICD-10-CM | POA: Diagnosis not present

## 2018-06-04 DIAGNOSIS — D509 Iron deficiency anemia, unspecified: Secondary | ICD-10-CM | POA: Diagnosis not present

## 2018-06-04 DIAGNOSIS — N186 End stage renal disease: Secondary | ICD-10-CM | POA: Diagnosis not present

## 2018-06-04 DIAGNOSIS — D631 Anemia in chronic kidney disease: Secondary | ICD-10-CM | POA: Diagnosis not present

## 2018-06-04 DIAGNOSIS — Z992 Dependence on renal dialysis: Secondary | ICD-10-CM | POA: Diagnosis not present

## 2018-06-04 DIAGNOSIS — N2581 Secondary hyperparathyroidism of renal origin: Secondary | ICD-10-CM | POA: Diagnosis not present

## 2018-06-05 DIAGNOSIS — N2581 Secondary hyperparathyroidism of renal origin: Secondary | ICD-10-CM | POA: Diagnosis not present

## 2018-06-05 DIAGNOSIS — D509 Iron deficiency anemia, unspecified: Secondary | ICD-10-CM | POA: Diagnosis not present

## 2018-06-05 DIAGNOSIS — Z992 Dependence on renal dialysis: Secondary | ICD-10-CM | POA: Diagnosis not present

## 2018-06-05 DIAGNOSIS — D631 Anemia in chronic kidney disease: Secondary | ICD-10-CM | POA: Diagnosis not present

## 2018-06-05 DIAGNOSIS — N186 End stage renal disease: Secondary | ICD-10-CM | POA: Diagnosis not present

## 2018-06-06 DIAGNOSIS — D509 Iron deficiency anemia, unspecified: Secondary | ICD-10-CM | POA: Diagnosis not present

## 2018-06-06 DIAGNOSIS — D631 Anemia in chronic kidney disease: Secondary | ICD-10-CM | POA: Diagnosis not present

## 2018-06-06 DIAGNOSIS — N186 End stage renal disease: Secondary | ICD-10-CM | POA: Diagnosis not present

## 2018-06-06 DIAGNOSIS — N2581 Secondary hyperparathyroidism of renal origin: Secondary | ICD-10-CM | POA: Diagnosis not present

## 2018-06-06 DIAGNOSIS — Z992 Dependence on renal dialysis: Secondary | ICD-10-CM | POA: Diagnosis not present

## 2018-06-07 DIAGNOSIS — D631 Anemia in chronic kidney disease: Secondary | ICD-10-CM | POA: Diagnosis not present

## 2018-06-07 DIAGNOSIS — Z992 Dependence on renal dialysis: Secondary | ICD-10-CM | POA: Diagnosis not present

## 2018-06-07 DIAGNOSIS — D509 Iron deficiency anemia, unspecified: Secondary | ICD-10-CM | POA: Diagnosis not present

## 2018-06-07 DIAGNOSIS — N2581 Secondary hyperparathyroidism of renal origin: Secondary | ICD-10-CM | POA: Diagnosis not present

## 2018-06-07 DIAGNOSIS — N186 End stage renal disease: Secondary | ICD-10-CM | POA: Diagnosis not present

## 2018-06-08 DIAGNOSIS — N2581 Secondary hyperparathyroidism of renal origin: Secondary | ICD-10-CM | POA: Diagnosis not present

## 2018-06-08 DIAGNOSIS — Z992 Dependence on renal dialysis: Secondary | ICD-10-CM | POA: Diagnosis not present

## 2018-06-08 DIAGNOSIS — D631 Anemia in chronic kidney disease: Secondary | ICD-10-CM | POA: Diagnosis not present

## 2018-06-08 DIAGNOSIS — N186 End stage renal disease: Secondary | ICD-10-CM | POA: Diagnosis not present

## 2018-06-08 DIAGNOSIS — D509 Iron deficiency anemia, unspecified: Secondary | ICD-10-CM | POA: Diagnosis not present

## 2018-06-09 DIAGNOSIS — N2581 Secondary hyperparathyroidism of renal origin: Secondary | ICD-10-CM | POA: Diagnosis not present

## 2018-06-09 DIAGNOSIS — Z992 Dependence on renal dialysis: Secondary | ICD-10-CM | POA: Diagnosis not present

## 2018-06-09 DIAGNOSIS — D509 Iron deficiency anemia, unspecified: Secondary | ICD-10-CM | POA: Diagnosis not present

## 2018-06-09 DIAGNOSIS — N186 End stage renal disease: Secondary | ICD-10-CM | POA: Diagnosis not present

## 2018-06-09 DIAGNOSIS — D631 Anemia in chronic kidney disease: Secondary | ICD-10-CM | POA: Diagnosis not present

## 2018-06-10 DIAGNOSIS — Z992 Dependence on renal dialysis: Secondary | ICD-10-CM | POA: Diagnosis not present

## 2018-06-10 DIAGNOSIS — D509 Iron deficiency anemia, unspecified: Secondary | ICD-10-CM | POA: Diagnosis not present

## 2018-06-10 DIAGNOSIS — D631 Anemia in chronic kidney disease: Secondary | ICD-10-CM | POA: Diagnosis not present

## 2018-06-10 DIAGNOSIS — N2581 Secondary hyperparathyroidism of renal origin: Secondary | ICD-10-CM | POA: Diagnosis not present

## 2018-06-10 DIAGNOSIS — N186 End stage renal disease: Secondary | ICD-10-CM | POA: Diagnosis not present

## 2018-06-11 DIAGNOSIS — N2581 Secondary hyperparathyroidism of renal origin: Secondary | ICD-10-CM | POA: Diagnosis not present

## 2018-06-11 DIAGNOSIS — Z992 Dependence on renal dialysis: Secondary | ICD-10-CM | POA: Diagnosis not present

## 2018-06-11 DIAGNOSIS — D631 Anemia in chronic kidney disease: Secondary | ICD-10-CM | POA: Diagnosis not present

## 2018-06-11 DIAGNOSIS — N186 End stage renal disease: Secondary | ICD-10-CM | POA: Diagnosis not present

## 2018-06-11 DIAGNOSIS — D509 Iron deficiency anemia, unspecified: Secondary | ICD-10-CM | POA: Diagnosis not present

## 2018-06-12 DIAGNOSIS — Z992 Dependence on renal dialysis: Secondary | ICD-10-CM | POA: Diagnosis not present

## 2018-06-12 DIAGNOSIS — N2581 Secondary hyperparathyroidism of renal origin: Secondary | ICD-10-CM | POA: Diagnosis not present

## 2018-06-12 DIAGNOSIS — D631 Anemia in chronic kidney disease: Secondary | ICD-10-CM | POA: Diagnosis not present

## 2018-06-12 DIAGNOSIS — N186 End stage renal disease: Secondary | ICD-10-CM | POA: Diagnosis not present

## 2018-06-12 DIAGNOSIS — D509 Iron deficiency anemia, unspecified: Secondary | ICD-10-CM | POA: Diagnosis not present

## 2018-06-13 DIAGNOSIS — N186 End stage renal disease: Secondary | ICD-10-CM | POA: Diagnosis not present

## 2018-06-13 DIAGNOSIS — D631 Anemia in chronic kidney disease: Secondary | ICD-10-CM | POA: Diagnosis not present

## 2018-06-13 DIAGNOSIS — D509 Iron deficiency anemia, unspecified: Secondary | ICD-10-CM | POA: Diagnosis not present

## 2018-06-13 DIAGNOSIS — N2581 Secondary hyperparathyroidism of renal origin: Secondary | ICD-10-CM | POA: Diagnosis not present

## 2018-06-13 DIAGNOSIS — Z992 Dependence on renal dialysis: Secondary | ICD-10-CM | POA: Diagnosis not present

## 2018-06-14 DIAGNOSIS — N2581 Secondary hyperparathyroidism of renal origin: Secondary | ICD-10-CM | POA: Diagnosis not present

## 2018-06-14 DIAGNOSIS — Z992 Dependence on renal dialysis: Secondary | ICD-10-CM | POA: Diagnosis not present

## 2018-06-14 DIAGNOSIS — N186 End stage renal disease: Secondary | ICD-10-CM | POA: Diagnosis not present

## 2018-06-14 DIAGNOSIS — D631 Anemia in chronic kidney disease: Secondary | ICD-10-CM | POA: Diagnosis not present

## 2018-06-14 DIAGNOSIS — D509 Iron deficiency anemia, unspecified: Secondary | ICD-10-CM | POA: Diagnosis not present

## 2018-06-15 DIAGNOSIS — N2581 Secondary hyperparathyroidism of renal origin: Secondary | ICD-10-CM | POA: Diagnosis not present

## 2018-06-15 DIAGNOSIS — N186 End stage renal disease: Secondary | ICD-10-CM | POA: Diagnosis not present

## 2018-06-15 DIAGNOSIS — Z992 Dependence on renal dialysis: Secondary | ICD-10-CM | POA: Diagnosis not present

## 2018-06-15 DIAGNOSIS — D509 Iron deficiency anemia, unspecified: Secondary | ICD-10-CM | POA: Diagnosis not present

## 2018-06-15 DIAGNOSIS — D631 Anemia in chronic kidney disease: Secondary | ICD-10-CM | POA: Diagnosis not present

## 2018-06-16 DIAGNOSIS — D509 Iron deficiency anemia, unspecified: Secondary | ICD-10-CM | POA: Diagnosis not present

## 2018-06-16 DIAGNOSIS — D631 Anemia in chronic kidney disease: Secondary | ICD-10-CM | POA: Diagnosis not present

## 2018-06-16 DIAGNOSIS — N2581 Secondary hyperparathyroidism of renal origin: Secondary | ICD-10-CM | POA: Diagnosis not present

## 2018-06-16 DIAGNOSIS — N186 End stage renal disease: Secondary | ICD-10-CM | POA: Diagnosis not present

## 2018-06-16 DIAGNOSIS — Z992 Dependence on renal dialysis: Secondary | ICD-10-CM | POA: Diagnosis not present

## 2018-06-17 DIAGNOSIS — N186 End stage renal disease: Secondary | ICD-10-CM | POA: Diagnosis not present

## 2018-06-17 DIAGNOSIS — D509 Iron deficiency anemia, unspecified: Secondary | ICD-10-CM | POA: Diagnosis not present

## 2018-06-17 DIAGNOSIS — N2581 Secondary hyperparathyroidism of renal origin: Secondary | ICD-10-CM | POA: Diagnosis not present

## 2018-06-17 DIAGNOSIS — Z992 Dependence on renal dialysis: Secondary | ICD-10-CM | POA: Diagnosis not present

## 2018-06-17 DIAGNOSIS — D631 Anemia in chronic kidney disease: Secondary | ICD-10-CM | POA: Diagnosis not present

## 2018-06-18 DIAGNOSIS — N186 End stage renal disease: Secondary | ICD-10-CM | POA: Diagnosis not present

## 2018-06-18 DIAGNOSIS — D509 Iron deficiency anemia, unspecified: Secondary | ICD-10-CM | POA: Diagnosis not present

## 2018-06-18 DIAGNOSIS — N2581 Secondary hyperparathyroidism of renal origin: Secondary | ICD-10-CM | POA: Diagnosis not present

## 2018-06-18 DIAGNOSIS — D631 Anemia in chronic kidney disease: Secondary | ICD-10-CM | POA: Diagnosis not present

## 2018-06-18 DIAGNOSIS — Z992 Dependence on renal dialysis: Secondary | ICD-10-CM | POA: Diagnosis not present

## 2018-06-19 DIAGNOSIS — Z992 Dependence on renal dialysis: Secondary | ICD-10-CM | POA: Diagnosis not present

## 2018-06-19 DIAGNOSIS — D509 Iron deficiency anemia, unspecified: Secondary | ICD-10-CM | POA: Diagnosis not present

## 2018-06-19 DIAGNOSIS — N2581 Secondary hyperparathyroidism of renal origin: Secondary | ICD-10-CM | POA: Diagnosis not present

## 2018-06-19 DIAGNOSIS — N186 End stage renal disease: Secondary | ICD-10-CM | POA: Diagnosis not present

## 2018-06-19 DIAGNOSIS — D631 Anemia in chronic kidney disease: Secondary | ICD-10-CM | POA: Diagnosis not present

## 2018-06-20 DIAGNOSIS — N2581 Secondary hyperparathyroidism of renal origin: Secondary | ICD-10-CM | POA: Diagnosis not present

## 2018-06-20 DIAGNOSIS — N186 End stage renal disease: Secondary | ICD-10-CM | POA: Diagnosis not present

## 2018-06-20 DIAGNOSIS — D631 Anemia in chronic kidney disease: Secondary | ICD-10-CM | POA: Diagnosis not present

## 2018-06-20 DIAGNOSIS — D509 Iron deficiency anemia, unspecified: Secondary | ICD-10-CM | POA: Diagnosis not present

## 2018-06-20 DIAGNOSIS — Z992 Dependence on renal dialysis: Secondary | ICD-10-CM | POA: Diagnosis not present

## 2018-06-21 DIAGNOSIS — D509 Iron deficiency anemia, unspecified: Secondary | ICD-10-CM | POA: Diagnosis not present

## 2018-06-21 DIAGNOSIS — N186 End stage renal disease: Secondary | ICD-10-CM | POA: Diagnosis not present

## 2018-06-21 DIAGNOSIS — Z992 Dependence on renal dialysis: Secondary | ICD-10-CM | POA: Diagnosis not present

## 2018-06-21 DIAGNOSIS — D631 Anemia in chronic kidney disease: Secondary | ICD-10-CM | POA: Diagnosis not present

## 2018-06-21 DIAGNOSIS — N2581 Secondary hyperparathyroidism of renal origin: Secondary | ICD-10-CM | POA: Diagnosis not present

## 2018-06-22 DIAGNOSIS — Z992 Dependence on renal dialysis: Secondary | ICD-10-CM | POA: Diagnosis not present

## 2018-06-22 DIAGNOSIS — D509 Iron deficiency anemia, unspecified: Secondary | ICD-10-CM | POA: Diagnosis not present

## 2018-06-22 DIAGNOSIS — N2581 Secondary hyperparathyroidism of renal origin: Secondary | ICD-10-CM | POA: Diagnosis not present

## 2018-06-22 DIAGNOSIS — D631 Anemia in chronic kidney disease: Secondary | ICD-10-CM | POA: Diagnosis not present

## 2018-06-22 DIAGNOSIS — N186 End stage renal disease: Secondary | ICD-10-CM | POA: Diagnosis not present

## 2018-06-23 DIAGNOSIS — N2581 Secondary hyperparathyroidism of renal origin: Secondary | ICD-10-CM | POA: Diagnosis not present

## 2018-06-23 DIAGNOSIS — Z992 Dependence on renal dialysis: Secondary | ICD-10-CM | POA: Diagnosis not present

## 2018-06-23 DIAGNOSIS — D509 Iron deficiency anemia, unspecified: Secondary | ICD-10-CM | POA: Diagnosis not present

## 2018-06-23 DIAGNOSIS — D631 Anemia in chronic kidney disease: Secondary | ICD-10-CM | POA: Diagnosis not present

## 2018-06-23 DIAGNOSIS — N186 End stage renal disease: Secondary | ICD-10-CM | POA: Diagnosis not present

## 2018-06-24 DIAGNOSIS — N186 End stage renal disease: Secondary | ICD-10-CM | POA: Diagnosis not present

## 2018-06-24 DIAGNOSIS — D509 Iron deficiency anemia, unspecified: Secondary | ICD-10-CM | POA: Diagnosis not present

## 2018-06-24 DIAGNOSIS — Z992 Dependence on renal dialysis: Secondary | ICD-10-CM | POA: Diagnosis not present

## 2018-06-24 DIAGNOSIS — D631 Anemia in chronic kidney disease: Secondary | ICD-10-CM | POA: Diagnosis not present

## 2018-06-24 DIAGNOSIS — N2581 Secondary hyperparathyroidism of renal origin: Secondary | ICD-10-CM | POA: Diagnosis not present

## 2018-06-25 DIAGNOSIS — D631 Anemia in chronic kidney disease: Secondary | ICD-10-CM | POA: Diagnosis not present

## 2018-06-25 DIAGNOSIS — N2581 Secondary hyperparathyroidism of renal origin: Secondary | ICD-10-CM | POA: Diagnosis not present

## 2018-06-25 DIAGNOSIS — D509 Iron deficiency anemia, unspecified: Secondary | ICD-10-CM | POA: Diagnosis not present

## 2018-06-25 DIAGNOSIS — Z992 Dependence on renal dialysis: Secondary | ICD-10-CM | POA: Diagnosis not present

## 2018-06-25 DIAGNOSIS — N186 End stage renal disease: Secondary | ICD-10-CM | POA: Diagnosis not present

## 2018-06-26 DIAGNOSIS — N186 End stage renal disease: Secondary | ICD-10-CM | POA: Diagnosis not present

## 2018-06-26 DIAGNOSIS — N2581 Secondary hyperparathyroidism of renal origin: Secondary | ICD-10-CM | POA: Diagnosis not present

## 2018-06-26 DIAGNOSIS — D509 Iron deficiency anemia, unspecified: Secondary | ICD-10-CM | POA: Diagnosis not present

## 2018-06-26 DIAGNOSIS — D631 Anemia in chronic kidney disease: Secondary | ICD-10-CM | POA: Diagnosis not present

## 2018-06-26 DIAGNOSIS — Z992 Dependence on renal dialysis: Secondary | ICD-10-CM | POA: Diagnosis not present

## 2018-06-27 DIAGNOSIS — D631 Anemia in chronic kidney disease: Secondary | ICD-10-CM | POA: Diagnosis not present

## 2018-06-27 DIAGNOSIS — N186 End stage renal disease: Secondary | ICD-10-CM | POA: Diagnosis not present

## 2018-06-27 DIAGNOSIS — N2581 Secondary hyperparathyroidism of renal origin: Secondary | ICD-10-CM | POA: Diagnosis not present

## 2018-06-27 DIAGNOSIS — Z992 Dependence on renal dialysis: Secondary | ICD-10-CM | POA: Diagnosis not present

## 2018-06-27 DIAGNOSIS — D509 Iron deficiency anemia, unspecified: Secondary | ICD-10-CM | POA: Diagnosis not present

## 2018-06-28 DIAGNOSIS — D509 Iron deficiency anemia, unspecified: Secondary | ICD-10-CM | POA: Diagnosis not present

## 2018-06-28 DIAGNOSIS — Z992 Dependence on renal dialysis: Secondary | ICD-10-CM | POA: Diagnosis not present

## 2018-06-28 DIAGNOSIS — N186 End stage renal disease: Secondary | ICD-10-CM | POA: Diagnosis not present

## 2018-06-28 DIAGNOSIS — D631 Anemia in chronic kidney disease: Secondary | ICD-10-CM | POA: Diagnosis not present

## 2018-06-28 DIAGNOSIS — N2581 Secondary hyperparathyroidism of renal origin: Secondary | ICD-10-CM | POA: Diagnosis not present

## 2018-06-29 DIAGNOSIS — N2581 Secondary hyperparathyroidism of renal origin: Secondary | ICD-10-CM | POA: Diagnosis not present

## 2018-06-29 DIAGNOSIS — Z992 Dependence on renal dialysis: Secondary | ICD-10-CM | POA: Diagnosis not present

## 2018-06-29 DIAGNOSIS — D631 Anemia in chronic kidney disease: Secondary | ICD-10-CM | POA: Diagnosis not present

## 2018-06-29 DIAGNOSIS — N186 End stage renal disease: Secondary | ICD-10-CM | POA: Diagnosis not present

## 2018-06-29 DIAGNOSIS — D509 Iron deficiency anemia, unspecified: Secondary | ICD-10-CM | POA: Diagnosis not present

## 2018-06-30 DIAGNOSIS — Z992 Dependence on renal dialysis: Secondary | ICD-10-CM | POA: Diagnosis not present

## 2018-06-30 DIAGNOSIS — N2581 Secondary hyperparathyroidism of renal origin: Secondary | ICD-10-CM | POA: Diagnosis not present

## 2018-06-30 DIAGNOSIS — D509 Iron deficiency anemia, unspecified: Secondary | ICD-10-CM | POA: Diagnosis not present

## 2018-06-30 DIAGNOSIS — D631 Anemia in chronic kidney disease: Secondary | ICD-10-CM | POA: Diagnosis not present

## 2018-06-30 DIAGNOSIS — N186 End stage renal disease: Secondary | ICD-10-CM | POA: Diagnosis not present

## 2018-07-01 DIAGNOSIS — D509 Iron deficiency anemia, unspecified: Secondary | ICD-10-CM | POA: Diagnosis not present

## 2018-07-01 DIAGNOSIS — N2581 Secondary hyperparathyroidism of renal origin: Secondary | ICD-10-CM | POA: Diagnosis not present

## 2018-07-01 DIAGNOSIS — Z992 Dependence on renal dialysis: Secondary | ICD-10-CM | POA: Diagnosis not present

## 2018-07-01 DIAGNOSIS — D631 Anemia in chronic kidney disease: Secondary | ICD-10-CM | POA: Diagnosis not present

## 2018-07-01 DIAGNOSIS — N186 End stage renal disease: Secondary | ICD-10-CM | POA: Diagnosis not present

## 2018-07-02 DIAGNOSIS — D509 Iron deficiency anemia, unspecified: Secondary | ICD-10-CM | POA: Diagnosis not present

## 2018-07-02 DIAGNOSIS — N2581 Secondary hyperparathyroidism of renal origin: Secondary | ICD-10-CM | POA: Diagnosis not present

## 2018-07-02 DIAGNOSIS — Z992 Dependence on renal dialysis: Secondary | ICD-10-CM | POA: Diagnosis not present

## 2018-07-02 DIAGNOSIS — D631 Anemia in chronic kidney disease: Secondary | ICD-10-CM | POA: Diagnosis not present

## 2018-07-02 DIAGNOSIS — N186 End stage renal disease: Secondary | ICD-10-CM | POA: Diagnosis not present

## 2018-07-03 DIAGNOSIS — N186 End stage renal disease: Secondary | ICD-10-CM | POA: Diagnosis not present

## 2018-07-03 DIAGNOSIS — Z992 Dependence on renal dialysis: Secondary | ICD-10-CM | POA: Diagnosis not present

## 2018-07-03 DIAGNOSIS — N2581 Secondary hyperparathyroidism of renal origin: Secondary | ICD-10-CM | POA: Diagnosis not present

## 2018-07-03 DIAGNOSIS — D631 Anemia in chronic kidney disease: Secondary | ICD-10-CM | POA: Diagnosis not present

## 2018-07-03 DIAGNOSIS — D509 Iron deficiency anemia, unspecified: Secondary | ICD-10-CM | POA: Diagnosis not present

## 2018-07-04 DIAGNOSIS — Z992 Dependence on renal dialysis: Secondary | ICD-10-CM | POA: Diagnosis not present

## 2018-07-04 DIAGNOSIS — D631 Anemia in chronic kidney disease: Secondary | ICD-10-CM | POA: Diagnosis not present

## 2018-07-04 DIAGNOSIS — D509 Iron deficiency anemia, unspecified: Secondary | ICD-10-CM | POA: Diagnosis not present

## 2018-07-04 DIAGNOSIS — N186 End stage renal disease: Secondary | ICD-10-CM | POA: Diagnosis not present

## 2018-07-04 DIAGNOSIS — N2581 Secondary hyperparathyroidism of renal origin: Secondary | ICD-10-CM | POA: Diagnosis not present

## 2018-07-05 DIAGNOSIS — N2581 Secondary hyperparathyroidism of renal origin: Secondary | ICD-10-CM | POA: Diagnosis not present

## 2018-07-05 DIAGNOSIS — D631 Anemia in chronic kidney disease: Secondary | ICD-10-CM | POA: Diagnosis not present

## 2018-07-05 DIAGNOSIS — D509 Iron deficiency anemia, unspecified: Secondary | ICD-10-CM | POA: Diagnosis not present

## 2018-07-05 DIAGNOSIS — Z992 Dependence on renal dialysis: Secondary | ICD-10-CM | POA: Diagnosis not present

## 2018-07-05 DIAGNOSIS — N186 End stage renal disease: Secondary | ICD-10-CM | POA: Diagnosis not present

## 2018-07-06 DIAGNOSIS — N186 End stage renal disease: Secondary | ICD-10-CM | POA: Diagnosis not present

## 2018-07-06 DIAGNOSIS — D631 Anemia in chronic kidney disease: Secondary | ICD-10-CM | POA: Diagnosis not present

## 2018-07-06 DIAGNOSIS — D509 Iron deficiency anemia, unspecified: Secondary | ICD-10-CM | POA: Diagnosis not present

## 2018-07-06 DIAGNOSIS — N2581 Secondary hyperparathyroidism of renal origin: Secondary | ICD-10-CM | POA: Diagnosis not present

## 2018-07-06 DIAGNOSIS — Z992 Dependence on renal dialysis: Secondary | ICD-10-CM | POA: Diagnosis not present

## 2018-07-07 DIAGNOSIS — N186 End stage renal disease: Secondary | ICD-10-CM | POA: Diagnosis not present

## 2018-07-07 DIAGNOSIS — N2581 Secondary hyperparathyroidism of renal origin: Secondary | ICD-10-CM | POA: Diagnosis not present

## 2018-07-07 DIAGNOSIS — Z992 Dependence on renal dialysis: Secondary | ICD-10-CM | POA: Diagnosis not present

## 2018-07-07 DIAGNOSIS — D509 Iron deficiency anemia, unspecified: Secondary | ICD-10-CM | POA: Diagnosis not present

## 2018-07-07 DIAGNOSIS — D631 Anemia in chronic kidney disease: Secondary | ICD-10-CM | POA: Diagnosis not present

## 2018-07-08 DIAGNOSIS — Z992 Dependence on renal dialysis: Secondary | ICD-10-CM | POA: Diagnosis not present

## 2018-07-08 DIAGNOSIS — N186 End stage renal disease: Secondary | ICD-10-CM | POA: Diagnosis not present

## 2018-07-08 DIAGNOSIS — D509 Iron deficiency anemia, unspecified: Secondary | ICD-10-CM | POA: Diagnosis not present

## 2018-07-08 DIAGNOSIS — N2581 Secondary hyperparathyroidism of renal origin: Secondary | ICD-10-CM | POA: Diagnosis not present

## 2018-07-08 DIAGNOSIS — D631 Anemia in chronic kidney disease: Secondary | ICD-10-CM | POA: Diagnosis not present

## 2018-07-09 DIAGNOSIS — N186 End stage renal disease: Secondary | ICD-10-CM | POA: Diagnosis not present

## 2018-07-09 DIAGNOSIS — Z992 Dependence on renal dialysis: Secondary | ICD-10-CM | POA: Diagnosis not present

## 2018-07-09 DIAGNOSIS — N2581 Secondary hyperparathyroidism of renal origin: Secondary | ICD-10-CM | POA: Diagnosis not present

## 2018-07-09 DIAGNOSIS — D631 Anemia in chronic kidney disease: Secondary | ICD-10-CM | POA: Diagnosis not present

## 2018-07-09 DIAGNOSIS — D509 Iron deficiency anemia, unspecified: Secondary | ICD-10-CM | POA: Diagnosis not present

## 2018-07-10 DIAGNOSIS — N186 End stage renal disease: Secondary | ICD-10-CM | POA: Diagnosis not present

## 2018-07-10 DIAGNOSIS — D509 Iron deficiency anemia, unspecified: Secondary | ICD-10-CM | POA: Diagnosis not present

## 2018-07-10 DIAGNOSIS — N2581 Secondary hyperparathyroidism of renal origin: Secondary | ICD-10-CM | POA: Diagnosis not present

## 2018-07-10 DIAGNOSIS — D631 Anemia in chronic kidney disease: Secondary | ICD-10-CM | POA: Diagnosis not present

## 2018-07-10 DIAGNOSIS — Z992 Dependence on renal dialysis: Secondary | ICD-10-CM | POA: Diagnosis not present

## 2018-07-11 DIAGNOSIS — Z992 Dependence on renal dialysis: Secondary | ICD-10-CM | POA: Diagnosis not present

## 2018-07-11 DIAGNOSIS — D631 Anemia in chronic kidney disease: Secondary | ICD-10-CM | POA: Diagnosis not present

## 2018-07-11 DIAGNOSIS — N186 End stage renal disease: Secondary | ICD-10-CM | POA: Diagnosis not present

## 2018-07-11 DIAGNOSIS — N2581 Secondary hyperparathyroidism of renal origin: Secondary | ICD-10-CM | POA: Diagnosis not present

## 2018-07-11 DIAGNOSIS — D509 Iron deficiency anemia, unspecified: Secondary | ICD-10-CM | POA: Diagnosis not present

## 2018-07-12 DIAGNOSIS — N2581 Secondary hyperparathyroidism of renal origin: Secondary | ICD-10-CM | POA: Diagnosis not present

## 2018-07-12 DIAGNOSIS — D631 Anemia in chronic kidney disease: Secondary | ICD-10-CM | POA: Diagnosis not present

## 2018-07-12 DIAGNOSIS — N186 End stage renal disease: Secondary | ICD-10-CM | POA: Diagnosis not present

## 2018-07-12 DIAGNOSIS — D509 Iron deficiency anemia, unspecified: Secondary | ICD-10-CM | POA: Diagnosis not present

## 2018-07-12 DIAGNOSIS — Z992 Dependence on renal dialysis: Secondary | ICD-10-CM | POA: Diagnosis not present

## 2018-07-13 DIAGNOSIS — N2581 Secondary hyperparathyroidism of renal origin: Secondary | ICD-10-CM | POA: Diagnosis not present

## 2018-07-13 DIAGNOSIS — D631 Anemia in chronic kidney disease: Secondary | ICD-10-CM | POA: Diagnosis not present

## 2018-07-13 DIAGNOSIS — Z992 Dependence on renal dialysis: Secondary | ICD-10-CM | POA: Diagnosis not present

## 2018-07-13 DIAGNOSIS — D509 Iron deficiency anemia, unspecified: Secondary | ICD-10-CM | POA: Diagnosis not present

## 2018-07-13 DIAGNOSIS — N186 End stage renal disease: Secondary | ICD-10-CM | POA: Diagnosis not present

## 2018-07-14 DIAGNOSIS — Z992 Dependence on renal dialysis: Secondary | ICD-10-CM | POA: Diagnosis not present

## 2018-07-14 DIAGNOSIS — N186 End stage renal disease: Secondary | ICD-10-CM | POA: Diagnosis not present

## 2018-07-14 DIAGNOSIS — D631 Anemia in chronic kidney disease: Secondary | ICD-10-CM | POA: Diagnosis not present

## 2018-07-14 DIAGNOSIS — N2581 Secondary hyperparathyroidism of renal origin: Secondary | ICD-10-CM | POA: Diagnosis not present

## 2018-07-14 DIAGNOSIS — D509 Iron deficiency anemia, unspecified: Secondary | ICD-10-CM | POA: Diagnosis not present

## 2018-07-15 DIAGNOSIS — N2581 Secondary hyperparathyroidism of renal origin: Secondary | ICD-10-CM | POA: Diagnosis not present

## 2018-07-15 DIAGNOSIS — Z992 Dependence on renal dialysis: Secondary | ICD-10-CM | POA: Diagnosis not present

## 2018-07-15 DIAGNOSIS — N186 End stage renal disease: Secondary | ICD-10-CM | POA: Diagnosis not present

## 2018-07-15 DIAGNOSIS — D509 Iron deficiency anemia, unspecified: Secondary | ICD-10-CM | POA: Diagnosis not present

## 2018-07-15 DIAGNOSIS — D631 Anemia in chronic kidney disease: Secondary | ICD-10-CM | POA: Diagnosis not present

## 2018-07-16 DIAGNOSIS — D509 Iron deficiency anemia, unspecified: Secondary | ICD-10-CM | POA: Diagnosis not present

## 2018-07-16 DIAGNOSIS — D631 Anemia in chronic kidney disease: Secondary | ICD-10-CM | POA: Diagnosis not present

## 2018-07-16 DIAGNOSIS — Z992 Dependence on renal dialysis: Secondary | ICD-10-CM | POA: Diagnosis not present

## 2018-07-16 DIAGNOSIS — N186 End stage renal disease: Secondary | ICD-10-CM | POA: Diagnosis not present

## 2018-07-16 DIAGNOSIS — N2581 Secondary hyperparathyroidism of renal origin: Secondary | ICD-10-CM | POA: Diagnosis not present

## 2018-07-17 DIAGNOSIS — Z992 Dependence on renal dialysis: Secondary | ICD-10-CM | POA: Diagnosis not present

## 2018-07-17 DIAGNOSIS — N186 End stage renal disease: Secondary | ICD-10-CM | POA: Diagnosis not present

## 2018-07-17 DIAGNOSIS — N2581 Secondary hyperparathyroidism of renal origin: Secondary | ICD-10-CM | POA: Diagnosis not present

## 2018-07-17 DIAGNOSIS — D509 Iron deficiency anemia, unspecified: Secondary | ICD-10-CM | POA: Diagnosis not present

## 2018-07-17 DIAGNOSIS — D631 Anemia in chronic kidney disease: Secondary | ICD-10-CM | POA: Diagnosis not present

## 2018-07-18 DIAGNOSIS — N186 End stage renal disease: Secondary | ICD-10-CM | POA: Diagnosis not present

## 2018-07-18 DIAGNOSIS — D509 Iron deficiency anemia, unspecified: Secondary | ICD-10-CM | POA: Diagnosis not present

## 2018-07-18 DIAGNOSIS — N2581 Secondary hyperparathyroidism of renal origin: Secondary | ICD-10-CM | POA: Diagnosis not present

## 2018-07-18 DIAGNOSIS — D631 Anemia in chronic kidney disease: Secondary | ICD-10-CM | POA: Diagnosis not present

## 2018-07-18 DIAGNOSIS — Z992 Dependence on renal dialysis: Secondary | ICD-10-CM | POA: Diagnosis not present

## 2018-07-19 DIAGNOSIS — Z992 Dependence on renal dialysis: Secondary | ICD-10-CM | POA: Diagnosis not present

## 2018-07-19 DIAGNOSIS — D509 Iron deficiency anemia, unspecified: Secondary | ICD-10-CM | POA: Diagnosis not present

## 2018-07-19 DIAGNOSIS — N186 End stage renal disease: Secondary | ICD-10-CM | POA: Diagnosis not present

## 2018-07-19 DIAGNOSIS — N2581 Secondary hyperparathyroidism of renal origin: Secondary | ICD-10-CM | POA: Diagnosis not present

## 2018-07-19 DIAGNOSIS — D631 Anemia in chronic kidney disease: Secondary | ICD-10-CM | POA: Diagnosis not present

## 2018-07-20 DIAGNOSIS — D631 Anemia in chronic kidney disease: Secondary | ICD-10-CM | POA: Diagnosis not present

## 2018-07-20 DIAGNOSIS — Z992 Dependence on renal dialysis: Secondary | ICD-10-CM | POA: Diagnosis not present

## 2018-07-20 DIAGNOSIS — N2581 Secondary hyperparathyroidism of renal origin: Secondary | ICD-10-CM | POA: Diagnosis not present

## 2018-07-20 DIAGNOSIS — D509 Iron deficiency anemia, unspecified: Secondary | ICD-10-CM | POA: Diagnosis not present

## 2018-07-20 DIAGNOSIS — N186 End stage renal disease: Secondary | ICD-10-CM | POA: Diagnosis not present

## 2018-07-21 DIAGNOSIS — D631 Anemia in chronic kidney disease: Secondary | ICD-10-CM | POA: Diagnosis not present

## 2018-07-21 DIAGNOSIS — N186 End stage renal disease: Secondary | ICD-10-CM | POA: Diagnosis not present

## 2018-07-21 DIAGNOSIS — Z992 Dependence on renal dialysis: Secondary | ICD-10-CM | POA: Diagnosis not present

## 2018-07-21 DIAGNOSIS — N2581 Secondary hyperparathyroidism of renal origin: Secondary | ICD-10-CM | POA: Diagnosis not present

## 2018-07-21 DIAGNOSIS — D509 Iron deficiency anemia, unspecified: Secondary | ICD-10-CM | POA: Diagnosis not present

## 2018-07-22 DIAGNOSIS — I10 Essential (primary) hypertension: Secondary | ICD-10-CM | POA: Diagnosis not present

## 2018-07-22 DIAGNOSIS — D509 Iron deficiency anemia, unspecified: Secondary | ICD-10-CM | POA: Diagnosis not present

## 2018-07-22 DIAGNOSIS — R079 Chest pain, unspecified: Secondary | ICD-10-CM | POA: Diagnosis not present

## 2018-07-22 DIAGNOSIS — Z992 Dependence on renal dialysis: Secondary | ICD-10-CM | POA: Diagnosis not present

## 2018-07-22 DIAGNOSIS — Z79899 Other long term (current) drug therapy: Secondary | ICD-10-CM | POA: Diagnosis not present

## 2018-07-22 DIAGNOSIS — M1990 Unspecified osteoarthritis, unspecified site: Secondary | ICD-10-CM | POA: Diagnosis not present

## 2018-07-22 DIAGNOSIS — M109 Gout, unspecified: Secondary | ICD-10-CM | POA: Diagnosis not present

## 2018-07-22 DIAGNOSIS — G2581 Restless legs syndrome: Secondary | ICD-10-CM | POA: Diagnosis not present

## 2018-07-22 DIAGNOSIS — I120 Hypertensive chronic kidney disease with stage 5 chronic kidney disease or end stage renal disease: Secondary | ICD-10-CM | POA: Diagnosis not present

## 2018-07-22 DIAGNOSIS — D631 Anemia in chronic kidney disease: Secondary | ICD-10-CM | POA: Diagnosis not present

## 2018-07-22 DIAGNOSIS — N186 End stage renal disease: Secondary | ICD-10-CM | POA: Diagnosis not present

## 2018-07-22 DIAGNOSIS — N2581 Secondary hyperparathyroidism of renal origin: Secondary | ICD-10-CM | POA: Diagnosis not present

## 2018-07-23 DIAGNOSIS — N186 End stage renal disease: Secondary | ICD-10-CM | POA: Diagnosis not present

## 2018-07-23 DIAGNOSIS — D631 Anemia in chronic kidney disease: Secondary | ICD-10-CM | POA: Diagnosis not present

## 2018-07-23 DIAGNOSIS — D509 Iron deficiency anemia, unspecified: Secondary | ICD-10-CM | POA: Diagnosis not present

## 2018-07-23 DIAGNOSIS — Z992 Dependence on renal dialysis: Secondary | ICD-10-CM | POA: Diagnosis not present

## 2018-07-23 DIAGNOSIS — N2581 Secondary hyperparathyroidism of renal origin: Secondary | ICD-10-CM | POA: Diagnosis not present

## 2018-07-24 DIAGNOSIS — D631 Anemia in chronic kidney disease: Secondary | ICD-10-CM | POA: Diagnosis not present

## 2018-07-24 DIAGNOSIS — N2581 Secondary hyperparathyroidism of renal origin: Secondary | ICD-10-CM | POA: Diagnosis not present

## 2018-07-24 DIAGNOSIS — D509 Iron deficiency anemia, unspecified: Secondary | ICD-10-CM | POA: Diagnosis not present

## 2018-07-24 DIAGNOSIS — Z992 Dependence on renal dialysis: Secondary | ICD-10-CM | POA: Diagnosis not present

## 2018-07-24 DIAGNOSIS — N186 End stage renal disease: Secondary | ICD-10-CM | POA: Diagnosis not present

## 2018-07-25 DIAGNOSIS — N2581 Secondary hyperparathyroidism of renal origin: Secondary | ICD-10-CM | POA: Diagnosis not present

## 2018-07-25 DIAGNOSIS — N186 End stage renal disease: Secondary | ICD-10-CM | POA: Diagnosis not present

## 2018-07-25 DIAGNOSIS — Z992 Dependence on renal dialysis: Secondary | ICD-10-CM | POA: Diagnosis not present

## 2018-07-25 DIAGNOSIS — D509 Iron deficiency anemia, unspecified: Secondary | ICD-10-CM | POA: Diagnosis not present

## 2018-07-25 DIAGNOSIS — D631 Anemia in chronic kidney disease: Secondary | ICD-10-CM | POA: Diagnosis not present

## 2018-07-26 DIAGNOSIS — N186 End stage renal disease: Secondary | ICD-10-CM | POA: Diagnosis not present

## 2018-07-26 DIAGNOSIS — D509 Iron deficiency anemia, unspecified: Secondary | ICD-10-CM | POA: Diagnosis not present

## 2018-07-26 DIAGNOSIS — Z992 Dependence on renal dialysis: Secondary | ICD-10-CM | POA: Diagnosis not present

## 2018-07-26 DIAGNOSIS — N2581 Secondary hyperparathyroidism of renal origin: Secondary | ICD-10-CM | POA: Diagnosis not present

## 2018-07-26 DIAGNOSIS — D631 Anemia in chronic kidney disease: Secondary | ICD-10-CM | POA: Diagnosis not present

## 2018-07-27 DIAGNOSIS — Z992 Dependence on renal dialysis: Secondary | ICD-10-CM | POA: Diagnosis not present

## 2018-07-27 DIAGNOSIS — N2581 Secondary hyperparathyroidism of renal origin: Secondary | ICD-10-CM | POA: Diagnosis not present

## 2018-07-27 DIAGNOSIS — D509 Iron deficiency anemia, unspecified: Secondary | ICD-10-CM | POA: Diagnosis not present

## 2018-07-27 DIAGNOSIS — D631 Anemia in chronic kidney disease: Secondary | ICD-10-CM | POA: Diagnosis not present

## 2018-07-27 DIAGNOSIS — N186 End stage renal disease: Secondary | ICD-10-CM | POA: Diagnosis not present

## 2018-07-28 DIAGNOSIS — Z992 Dependence on renal dialysis: Secondary | ICD-10-CM | POA: Diagnosis not present

## 2018-07-28 DIAGNOSIS — N2581 Secondary hyperparathyroidism of renal origin: Secondary | ICD-10-CM | POA: Diagnosis not present

## 2018-07-28 DIAGNOSIS — N186 End stage renal disease: Secondary | ICD-10-CM | POA: Diagnosis not present

## 2018-07-28 DIAGNOSIS — D631 Anemia in chronic kidney disease: Secondary | ICD-10-CM | POA: Diagnosis not present

## 2018-07-28 DIAGNOSIS — D509 Iron deficiency anemia, unspecified: Secondary | ICD-10-CM | POA: Diagnosis not present

## 2018-07-29 DIAGNOSIS — D631 Anemia in chronic kidney disease: Secondary | ICD-10-CM | POA: Diagnosis not present

## 2018-07-29 DIAGNOSIS — N186 End stage renal disease: Secondary | ICD-10-CM | POA: Diagnosis not present

## 2018-07-29 DIAGNOSIS — D509 Iron deficiency anemia, unspecified: Secondary | ICD-10-CM | POA: Diagnosis not present

## 2018-07-29 DIAGNOSIS — Z992 Dependence on renal dialysis: Secondary | ICD-10-CM | POA: Diagnosis not present

## 2018-07-29 DIAGNOSIS — N2581 Secondary hyperparathyroidism of renal origin: Secondary | ICD-10-CM | POA: Diagnosis not present

## 2018-07-30 DIAGNOSIS — Z992 Dependence on renal dialysis: Secondary | ICD-10-CM | POA: Diagnosis not present

## 2018-07-30 DIAGNOSIS — N186 End stage renal disease: Secondary | ICD-10-CM | POA: Diagnosis not present

## 2018-07-30 DIAGNOSIS — N2581 Secondary hyperparathyroidism of renal origin: Secondary | ICD-10-CM | POA: Diagnosis not present

## 2018-07-30 DIAGNOSIS — D631 Anemia in chronic kidney disease: Secondary | ICD-10-CM | POA: Diagnosis not present

## 2018-07-30 DIAGNOSIS — D509 Iron deficiency anemia, unspecified: Secondary | ICD-10-CM | POA: Diagnosis not present

## 2018-07-31 DIAGNOSIS — D631 Anemia in chronic kidney disease: Secondary | ICD-10-CM | POA: Diagnosis not present

## 2018-07-31 DIAGNOSIS — Z992 Dependence on renal dialysis: Secondary | ICD-10-CM | POA: Diagnosis not present

## 2018-07-31 DIAGNOSIS — D509 Iron deficiency anemia, unspecified: Secondary | ICD-10-CM | POA: Diagnosis not present

## 2018-07-31 DIAGNOSIS — N186 End stage renal disease: Secondary | ICD-10-CM | POA: Diagnosis not present

## 2018-07-31 DIAGNOSIS — N2581 Secondary hyperparathyroidism of renal origin: Secondary | ICD-10-CM | POA: Diagnosis not present

## 2018-08-01 DIAGNOSIS — D631 Anemia in chronic kidney disease: Secondary | ICD-10-CM | POA: Diagnosis not present

## 2018-08-01 DIAGNOSIS — Z992 Dependence on renal dialysis: Secondary | ICD-10-CM | POA: Diagnosis not present

## 2018-08-01 DIAGNOSIS — D509 Iron deficiency anemia, unspecified: Secondary | ICD-10-CM | POA: Diagnosis not present

## 2018-08-01 DIAGNOSIS — N2581 Secondary hyperparathyroidism of renal origin: Secondary | ICD-10-CM | POA: Diagnosis not present

## 2018-08-01 DIAGNOSIS — N186 End stage renal disease: Secondary | ICD-10-CM | POA: Diagnosis not present

## 2018-08-02 DIAGNOSIS — N2581 Secondary hyperparathyroidism of renal origin: Secondary | ICD-10-CM | POA: Diagnosis not present

## 2018-08-02 DIAGNOSIS — D631 Anemia in chronic kidney disease: Secondary | ICD-10-CM | POA: Diagnosis not present

## 2018-08-02 DIAGNOSIS — D509 Iron deficiency anemia, unspecified: Secondary | ICD-10-CM | POA: Diagnosis not present

## 2018-08-02 DIAGNOSIS — Z992 Dependence on renal dialysis: Secondary | ICD-10-CM | POA: Diagnosis not present

## 2018-08-02 DIAGNOSIS — N186 End stage renal disease: Secondary | ICD-10-CM | POA: Diagnosis not present

## 2018-08-03 DIAGNOSIS — D631 Anemia in chronic kidney disease: Secondary | ICD-10-CM | POA: Diagnosis not present

## 2018-08-03 DIAGNOSIS — N2581 Secondary hyperparathyroidism of renal origin: Secondary | ICD-10-CM | POA: Diagnosis not present

## 2018-08-03 DIAGNOSIS — D509 Iron deficiency anemia, unspecified: Secondary | ICD-10-CM | POA: Diagnosis not present

## 2018-08-03 DIAGNOSIS — N186 End stage renal disease: Secondary | ICD-10-CM | POA: Diagnosis not present

## 2018-08-03 DIAGNOSIS — Z992 Dependence on renal dialysis: Secondary | ICD-10-CM | POA: Diagnosis not present

## 2018-08-04 DIAGNOSIS — N2581 Secondary hyperparathyroidism of renal origin: Secondary | ICD-10-CM | POA: Diagnosis not present

## 2018-08-04 DIAGNOSIS — Z992 Dependence on renal dialysis: Secondary | ICD-10-CM | POA: Diagnosis not present

## 2018-08-04 DIAGNOSIS — D509 Iron deficiency anemia, unspecified: Secondary | ICD-10-CM | POA: Diagnosis not present

## 2018-08-04 DIAGNOSIS — N186 End stage renal disease: Secondary | ICD-10-CM | POA: Diagnosis not present

## 2018-08-04 DIAGNOSIS — D631 Anemia in chronic kidney disease: Secondary | ICD-10-CM | POA: Diagnosis not present

## 2018-08-05 DIAGNOSIS — D509 Iron deficiency anemia, unspecified: Secondary | ICD-10-CM | POA: Diagnosis not present

## 2018-08-05 DIAGNOSIS — D631 Anemia in chronic kidney disease: Secondary | ICD-10-CM | POA: Diagnosis not present

## 2018-08-05 DIAGNOSIS — N186 End stage renal disease: Secondary | ICD-10-CM | POA: Diagnosis not present

## 2018-08-05 DIAGNOSIS — Z992 Dependence on renal dialysis: Secondary | ICD-10-CM | POA: Diagnosis not present

## 2018-08-05 DIAGNOSIS — N2581 Secondary hyperparathyroidism of renal origin: Secondary | ICD-10-CM | POA: Diagnosis not present

## 2018-08-06 DIAGNOSIS — Z992 Dependence on renal dialysis: Secondary | ICD-10-CM | POA: Diagnosis not present

## 2018-08-06 DIAGNOSIS — D509 Iron deficiency anemia, unspecified: Secondary | ICD-10-CM | POA: Diagnosis not present

## 2018-08-06 DIAGNOSIS — N2581 Secondary hyperparathyroidism of renal origin: Secondary | ICD-10-CM | POA: Diagnosis not present

## 2018-08-06 DIAGNOSIS — N186 End stage renal disease: Secondary | ICD-10-CM | POA: Diagnosis not present

## 2018-08-06 DIAGNOSIS — D631 Anemia in chronic kidney disease: Secondary | ICD-10-CM | POA: Diagnosis not present

## 2018-08-07 DIAGNOSIS — D631 Anemia in chronic kidney disease: Secondary | ICD-10-CM | POA: Diagnosis not present

## 2018-08-07 DIAGNOSIS — Z992 Dependence on renal dialysis: Secondary | ICD-10-CM | POA: Diagnosis not present

## 2018-08-07 DIAGNOSIS — N2581 Secondary hyperparathyroidism of renal origin: Secondary | ICD-10-CM | POA: Diagnosis not present

## 2018-08-07 DIAGNOSIS — N186 End stage renal disease: Secondary | ICD-10-CM | POA: Diagnosis not present

## 2018-08-07 DIAGNOSIS — D509 Iron deficiency anemia, unspecified: Secondary | ICD-10-CM | POA: Diagnosis not present

## 2018-08-08 DIAGNOSIS — D509 Iron deficiency anemia, unspecified: Secondary | ICD-10-CM | POA: Diagnosis not present

## 2018-08-08 DIAGNOSIS — N186 End stage renal disease: Secondary | ICD-10-CM | POA: Diagnosis not present

## 2018-08-08 DIAGNOSIS — N2581 Secondary hyperparathyroidism of renal origin: Secondary | ICD-10-CM | POA: Diagnosis not present

## 2018-08-08 DIAGNOSIS — Z992 Dependence on renal dialysis: Secondary | ICD-10-CM | POA: Diagnosis not present

## 2018-08-08 DIAGNOSIS — D631 Anemia in chronic kidney disease: Secondary | ICD-10-CM | POA: Diagnosis not present

## 2018-08-09 DIAGNOSIS — D631 Anemia in chronic kidney disease: Secondary | ICD-10-CM | POA: Diagnosis not present

## 2018-08-09 DIAGNOSIS — N186 End stage renal disease: Secondary | ICD-10-CM | POA: Diagnosis not present

## 2018-08-09 DIAGNOSIS — N2581 Secondary hyperparathyroidism of renal origin: Secondary | ICD-10-CM | POA: Diagnosis not present

## 2018-08-09 DIAGNOSIS — Z992 Dependence on renal dialysis: Secondary | ICD-10-CM | POA: Diagnosis not present

## 2018-08-09 DIAGNOSIS — D509 Iron deficiency anemia, unspecified: Secondary | ICD-10-CM | POA: Diagnosis not present

## 2018-08-10 DIAGNOSIS — D509 Iron deficiency anemia, unspecified: Secondary | ICD-10-CM | POA: Diagnosis not present

## 2018-08-10 DIAGNOSIS — N2581 Secondary hyperparathyroidism of renal origin: Secondary | ICD-10-CM | POA: Diagnosis not present

## 2018-08-10 DIAGNOSIS — N186 End stage renal disease: Secondary | ICD-10-CM | POA: Diagnosis not present

## 2018-08-10 DIAGNOSIS — D631 Anemia in chronic kidney disease: Secondary | ICD-10-CM | POA: Diagnosis not present

## 2018-08-10 DIAGNOSIS — Z992 Dependence on renal dialysis: Secondary | ICD-10-CM | POA: Diagnosis not present

## 2018-08-11 DIAGNOSIS — N2581 Secondary hyperparathyroidism of renal origin: Secondary | ICD-10-CM | POA: Diagnosis not present

## 2018-08-11 DIAGNOSIS — D509 Iron deficiency anemia, unspecified: Secondary | ICD-10-CM | POA: Diagnosis not present

## 2018-08-11 DIAGNOSIS — D631 Anemia in chronic kidney disease: Secondary | ICD-10-CM | POA: Diagnosis not present

## 2018-08-11 DIAGNOSIS — N186 End stage renal disease: Secondary | ICD-10-CM | POA: Diagnosis not present

## 2018-08-11 DIAGNOSIS — Z992 Dependence on renal dialysis: Secondary | ICD-10-CM | POA: Diagnosis not present

## 2018-08-12 DIAGNOSIS — D631 Anemia in chronic kidney disease: Secondary | ICD-10-CM | POA: Diagnosis not present

## 2018-08-12 DIAGNOSIS — D509 Iron deficiency anemia, unspecified: Secondary | ICD-10-CM | POA: Diagnosis not present

## 2018-08-12 DIAGNOSIS — Z992 Dependence on renal dialysis: Secondary | ICD-10-CM | POA: Diagnosis not present

## 2018-08-12 DIAGNOSIS — N2581 Secondary hyperparathyroidism of renal origin: Secondary | ICD-10-CM | POA: Diagnosis not present

## 2018-08-12 DIAGNOSIS — N186 End stage renal disease: Secondary | ICD-10-CM | POA: Diagnosis not present

## 2018-08-13 DIAGNOSIS — Z992 Dependence on renal dialysis: Secondary | ICD-10-CM | POA: Diagnosis not present

## 2018-08-13 DIAGNOSIS — N2581 Secondary hyperparathyroidism of renal origin: Secondary | ICD-10-CM | POA: Diagnosis not present

## 2018-08-13 DIAGNOSIS — N186 End stage renal disease: Secondary | ICD-10-CM | POA: Diagnosis not present

## 2018-08-13 DIAGNOSIS — D631 Anemia in chronic kidney disease: Secondary | ICD-10-CM | POA: Diagnosis not present

## 2018-08-13 DIAGNOSIS — D509 Iron deficiency anemia, unspecified: Secondary | ICD-10-CM | POA: Diagnosis not present

## 2018-08-14 DIAGNOSIS — Z992 Dependence on renal dialysis: Secondary | ICD-10-CM | POA: Diagnosis not present

## 2018-08-14 DIAGNOSIS — N186 End stage renal disease: Secondary | ICD-10-CM | POA: Diagnosis not present

## 2018-08-14 DIAGNOSIS — D509 Iron deficiency anemia, unspecified: Secondary | ICD-10-CM | POA: Diagnosis not present

## 2018-08-14 DIAGNOSIS — D631 Anemia in chronic kidney disease: Secondary | ICD-10-CM | POA: Diagnosis not present

## 2018-08-14 DIAGNOSIS — N2581 Secondary hyperparathyroidism of renal origin: Secondary | ICD-10-CM | POA: Diagnosis not present

## 2018-08-15 DIAGNOSIS — Z992 Dependence on renal dialysis: Secondary | ICD-10-CM | POA: Diagnosis not present

## 2018-08-15 DIAGNOSIS — D631 Anemia in chronic kidney disease: Secondary | ICD-10-CM | POA: Diagnosis not present

## 2018-08-15 DIAGNOSIS — N2581 Secondary hyperparathyroidism of renal origin: Secondary | ICD-10-CM | POA: Diagnosis not present

## 2018-08-15 DIAGNOSIS — D509 Iron deficiency anemia, unspecified: Secondary | ICD-10-CM | POA: Diagnosis not present

## 2018-08-15 DIAGNOSIS — N186 End stage renal disease: Secondary | ICD-10-CM | POA: Diagnosis not present

## 2018-08-16 DIAGNOSIS — E875 Hyperkalemia: Secondary | ICD-10-CM | POA: Diagnosis not present

## 2018-08-16 DIAGNOSIS — I10 Essential (primary) hypertension: Secondary | ICD-10-CM | POA: Diagnosis not present

## 2018-08-16 DIAGNOSIS — D631 Anemia in chronic kidney disease: Secondary | ICD-10-CM | POA: Diagnosis not present

## 2018-08-16 DIAGNOSIS — Z992 Dependence on renal dialysis: Secondary | ICD-10-CM | POA: Diagnosis not present

## 2018-08-16 DIAGNOSIS — M109 Gout, unspecified: Secondary | ICD-10-CM | POA: Diagnosis not present

## 2018-08-16 DIAGNOSIS — K219 Gastro-esophageal reflux disease without esophagitis: Secondary | ICD-10-CM | POA: Diagnosis not present

## 2018-08-16 DIAGNOSIS — R51 Headache: Secondary | ICD-10-CM | POA: Diagnosis not present

## 2018-08-16 DIAGNOSIS — R0902 Hypoxemia: Secondary | ICD-10-CM | POA: Diagnosis not present

## 2018-08-16 DIAGNOSIS — R0602 Shortness of breath: Secondary | ICD-10-CM | POA: Diagnosis not present

## 2018-08-16 DIAGNOSIS — D509 Iron deficiency anemia, unspecified: Secondary | ICD-10-CM | POA: Diagnosis not present

## 2018-08-16 DIAGNOSIS — N2581 Secondary hyperparathyroidism of renal origin: Secondary | ICD-10-CM | POA: Diagnosis not present

## 2018-08-16 DIAGNOSIS — N186 End stage renal disease: Secondary | ICD-10-CM | POA: Diagnosis not present

## 2018-08-16 DIAGNOSIS — Z79899 Other long term (current) drug therapy: Secondary | ICD-10-CM | POA: Diagnosis not present

## 2018-08-16 DIAGNOSIS — M1990 Unspecified osteoarthritis, unspecified site: Secondary | ICD-10-CM | POA: Diagnosis not present

## 2018-08-16 DIAGNOSIS — I120 Hypertensive chronic kidney disease with stage 5 chronic kidney disease or end stage renal disease: Secondary | ICD-10-CM | POA: Diagnosis not present

## 2018-08-16 DIAGNOSIS — R110 Nausea: Secondary | ICD-10-CM | POA: Diagnosis not present

## 2018-08-16 DIAGNOSIS — G2581 Restless legs syndrome: Secondary | ICD-10-CM | POA: Diagnosis not present

## 2018-08-16 DIAGNOSIS — R05 Cough: Secondary | ICD-10-CM | POA: Diagnosis not present

## 2018-08-17 DIAGNOSIS — N186 End stage renal disease: Secondary | ICD-10-CM | POA: Diagnosis not present

## 2018-08-17 DIAGNOSIS — D631 Anemia in chronic kidney disease: Secondary | ICD-10-CM | POA: Diagnosis not present

## 2018-08-17 DIAGNOSIS — D509 Iron deficiency anemia, unspecified: Secondary | ICD-10-CM | POA: Diagnosis not present

## 2018-08-17 DIAGNOSIS — N2581 Secondary hyperparathyroidism of renal origin: Secondary | ICD-10-CM | POA: Diagnosis not present

## 2018-08-17 DIAGNOSIS — Z992 Dependence on renal dialysis: Secondary | ICD-10-CM | POA: Diagnosis not present

## 2018-08-18 DIAGNOSIS — D631 Anemia in chronic kidney disease: Secondary | ICD-10-CM | POA: Diagnosis not present

## 2018-08-18 DIAGNOSIS — N186 End stage renal disease: Secondary | ICD-10-CM | POA: Diagnosis not present

## 2018-08-18 DIAGNOSIS — D509 Iron deficiency anemia, unspecified: Secondary | ICD-10-CM | POA: Diagnosis not present

## 2018-08-18 DIAGNOSIS — Z992 Dependence on renal dialysis: Secondary | ICD-10-CM | POA: Diagnosis not present

## 2018-08-18 DIAGNOSIS — N2581 Secondary hyperparathyroidism of renal origin: Secondary | ICD-10-CM | POA: Diagnosis not present

## 2018-08-19 DIAGNOSIS — Z7189 Other specified counseling: Secondary | ICD-10-CM | POA: Diagnosis not present

## 2018-08-19 DIAGNOSIS — N2581 Secondary hyperparathyroidism of renal origin: Secondary | ICD-10-CM | POA: Diagnosis not present

## 2018-08-19 DIAGNOSIS — D631 Anemia in chronic kidney disease: Secondary | ICD-10-CM | POA: Diagnosis not present

## 2018-08-19 DIAGNOSIS — E875 Hyperkalemia: Secondary | ICD-10-CM | POA: Diagnosis not present

## 2018-08-19 DIAGNOSIS — R0689 Other abnormalities of breathing: Secondary | ICD-10-CM | POA: Diagnosis not present

## 2018-08-19 DIAGNOSIS — N185 Chronic kidney disease, stage 5: Secondary | ICD-10-CM | POA: Diagnosis not present

## 2018-08-19 DIAGNOSIS — D509 Iron deficiency anemia, unspecified: Secondary | ICD-10-CM | POA: Diagnosis not present

## 2018-08-19 DIAGNOSIS — R0989 Other specified symptoms and signs involving the circulatory and respiratory systems: Secondary | ICD-10-CM | POA: Diagnosis not present

## 2018-08-19 DIAGNOSIS — Z992 Dependence on renal dialysis: Secondary | ICD-10-CM | POA: Diagnosis not present

## 2018-08-19 DIAGNOSIS — N186 End stage renal disease: Secondary | ICD-10-CM | POA: Diagnosis not present

## 2018-08-20 DIAGNOSIS — N186 End stage renal disease: Secondary | ICD-10-CM | POA: Diagnosis not present

## 2018-08-20 DIAGNOSIS — Z992 Dependence on renal dialysis: Secondary | ICD-10-CM | POA: Diagnosis not present

## 2018-08-20 DIAGNOSIS — D509 Iron deficiency anemia, unspecified: Secondary | ICD-10-CM | POA: Diagnosis not present

## 2018-08-20 DIAGNOSIS — D631 Anemia in chronic kidney disease: Secondary | ICD-10-CM | POA: Diagnosis not present

## 2018-08-20 DIAGNOSIS — N2581 Secondary hyperparathyroidism of renal origin: Secondary | ICD-10-CM | POA: Diagnosis not present

## 2018-08-21 DIAGNOSIS — D631 Anemia in chronic kidney disease: Secondary | ICD-10-CM | POA: Diagnosis not present

## 2018-08-21 DIAGNOSIS — D509 Iron deficiency anemia, unspecified: Secondary | ICD-10-CM | POA: Diagnosis not present

## 2018-08-21 DIAGNOSIS — N2581 Secondary hyperparathyroidism of renal origin: Secondary | ICD-10-CM | POA: Diagnosis not present

## 2018-08-21 DIAGNOSIS — Z992 Dependence on renal dialysis: Secondary | ICD-10-CM | POA: Diagnosis not present

## 2018-08-21 DIAGNOSIS — N186 End stage renal disease: Secondary | ICD-10-CM | POA: Diagnosis not present

## 2018-08-22 DIAGNOSIS — D631 Anemia in chronic kidney disease: Secondary | ICD-10-CM | POA: Diagnosis not present

## 2018-08-22 DIAGNOSIS — N2581 Secondary hyperparathyroidism of renal origin: Secondary | ICD-10-CM | POA: Diagnosis not present

## 2018-08-22 DIAGNOSIS — Z992 Dependence on renal dialysis: Secondary | ICD-10-CM | POA: Diagnosis not present

## 2018-08-22 DIAGNOSIS — D509 Iron deficiency anemia, unspecified: Secondary | ICD-10-CM | POA: Diagnosis not present

## 2018-08-22 DIAGNOSIS — N186 End stage renal disease: Secondary | ICD-10-CM | POA: Diagnosis not present

## 2018-08-23 DIAGNOSIS — Z992 Dependence on renal dialysis: Secondary | ICD-10-CM | POA: Diagnosis not present

## 2018-08-23 DIAGNOSIS — D509 Iron deficiency anemia, unspecified: Secondary | ICD-10-CM | POA: Diagnosis not present

## 2018-08-23 DIAGNOSIS — N2581 Secondary hyperparathyroidism of renal origin: Secondary | ICD-10-CM | POA: Diagnosis not present

## 2018-08-23 DIAGNOSIS — N186 End stage renal disease: Secondary | ICD-10-CM | POA: Diagnosis not present

## 2018-08-23 DIAGNOSIS — D631 Anemia in chronic kidney disease: Secondary | ICD-10-CM | POA: Diagnosis not present

## 2018-08-24 DIAGNOSIS — D509 Iron deficiency anemia, unspecified: Secondary | ICD-10-CM | POA: Diagnosis not present

## 2018-08-24 DIAGNOSIS — N2581 Secondary hyperparathyroidism of renal origin: Secondary | ICD-10-CM | POA: Diagnosis not present

## 2018-08-24 DIAGNOSIS — N186 End stage renal disease: Secondary | ICD-10-CM | POA: Diagnosis not present

## 2018-08-24 DIAGNOSIS — Z992 Dependence on renal dialysis: Secondary | ICD-10-CM | POA: Diagnosis not present

## 2018-08-24 DIAGNOSIS — D631 Anemia in chronic kidney disease: Secondary | ICD-10-CM | POA: Diagnosis not present

## 2018-08-25 DIAGNOSIS — Z992 Dependence on renal dialysis: Secondary | ICD-10-CM | POA: Diagnosis not present

## 2018-08-25 DIAGNOSIS — N2581 Secondary hyperparathyroidism of renal origin: Secondary | ICD-10-CM | POA: Diagnosis not present

## 2018-08-25 DIAGNOSIS — D631 Anemia in chronic kidney disease: Secondary | ICD-10-CM | POA: Diagnosis not present

## 2018-08-25 DIAGNOSIS — D509 Iron deficiency anemia, unspecified: Secondary | ICD-10-CM | POA: Diagnosis not present

## 2018-08-25 DIAGNOSIS — N186 End stage renal disease: Secondary | ICD-10-CM | POA: Diagnosis not present

## 2018-08-26 DIAGNOSIS — D509 Iron deficiency anemia, unspecified: Secondary | ICD-10-CM | POA: Diagnosis not present

## 2018-08-26 DIAGNOSIS — Z992 Dependence on renal dialysis: Secondary | ICD-10-CM | POA: Diagnosis not present

## 2018-08-26 DIAGNOSIS — D631 Anemia in chronic kidney disease: Secondary | ICD-10-CM | POA: Diagnosis not present

## 2018-08-26 DIAGNOSIS — N2581 Secondary hyperparathyroidism of renal origin: Secondary | ICD-10-CM | POA: Diagnosis not present

## 2018-08-26 DIAGNOSIS — N186 End stage renal disease: Secondary | ICD-10-CM | POA: Diagnosis not present

## 2018-08-27 DIAGNOSIS — D509 Iron deficiency anemia, unspecified: Secondary | ICD-10-CM | POA: Diagnosis not present

## 2018-08-27 DIAGNOSIS — N2581 Secondary hyperparathyroidism of renal origin: Secondary | ICD-10-CM | POA: Diagnosis not present

## 2018-08-27 DIAGNOSIS — Z992 Dependence on renal dialysis: Secondary | ICD-10-CM | POA: Diagnosis not present

## 2018-08-27 DIAGNOSIS — N186 End stage renal disease: Secondary | ICD-10-CM | POA: Diagnosis not present

## 2018-08-27 DIAGNOSIS — D631 Anemia in chronic kidney disease: Secondary | ICD-10-CM | POA: Diagnosis not present

## 2018-08-28 DIAGNOSIS — D509 Iron deficiency anemia, unspecified: Secondary | ICD-10-CM | POA: Diagnosis not present

## 2018-08-28 DIAGNOSIS — N186 End stage renal disease: Secondary | ICD-10-CM | POA: Diagnosis not present

## 2018-08-28 DIAGNOSIS — Z992 Dependence on renal dialysis: Secondary | ICD-10-CM | POA: Diagnosis not present

## 2018-08-28 DIAGNOSIS — D631 Anemia in chronic kidney disease: Secondary | ICD-10-CM | POA: Diagnosis not present

## 2018-08-28 DIAGNOSIS — N2581 Secondary hyperparathyroidism of renal origin: Secondary | ICD-10-CM | POA: Diagnosis not present

## 2018-08-29 DIAGNOSIS — D509 Iron deficiency anemia, unspecified: Secondary | ICD-10-CM | POA: Diagnosis not present

## 2018-08-29 DIAGNOSIS — D631 Anemia in chronic kidney disease: Secondary | ICD-10-CM | POA: Diagnosis not present

## 2018-08-29 DIAGNOSIS — Z992 Dependence on renal dialysis: Secondary | ICD-10-CM | POA: Diagnosis not present

## 2018-08-29 DIAGNOSIS — N2581 Secondary hyperparathyroidism of renal origin: Secondary | ICD-10-CM | POA: Diagnosis not present

## 2018-08-29 DIAGNOSIS — N186 End stage renal disease: Secondary | ICD-10-CM | POA: Diagnosis not present

## 2018-08-30 DIAGNOSIS — D509 Iron deficiency anemia, unspecified: Secondary | ICD-10-CM | POA: Diagnosis not present

## 2018-08-30 DIAGNOSIS — N2581 Secondary hyperparathyroidism of renal origin: Secondary | ICD-10-CM | POA: Diagnosis not present

## 2018-08-30 DIAGNOSIS — D631 Anemia in chronic kidney disease: Secondary | ICD-10-CM | POA: Diagnosis not present

## 2018-08-30 DIAGNOSIS — N186 End stage renal disease: Secondary | ICD-10-CM | POA: Diagnosis not present

## 2018-08-30 DIAGNOSIS — Z992 Dependence on renal dialysis: Secondary | ICD-10-CM | POA: Diagnosis not present

## 2018-08-31 DIAGNOSIS — D631 Anemia in chronic kidney disease: Secondary | ICD-10-CM | POA: Diagnosis not present

## 2018-08-31 DIAGNOSIS — Z992 Dependence on renal dialysis: Secondary | ICD-10-CM | POA: Diagnosis not present

## 2018-08-31 DIAGNOSIS — N2581 Secondary hyperparathyroidism of renal origin: Secondary | ICD-10-CM | POA: Diagnosis not present

## 2018-08-31 DIAGNOSIS — N186 End stage renal disease: Secondary | ICD-10-CM | POA: Diagnosis not present

## 2018-08-31 DIAGNOSIS — D509 Iron deficiency anemia, unspecified: Secondary | ICD-10-CM | POA: Diagnosis not present

## 2018-09-01 DIAGNOSIS — D631 Anemia in chronic kidney disease: Secondary | ICD-10-CM | POA: Diagnosis not present

## 2018-09-01 DIAGNOSIS — Z992 Dependence on renal dialysis: Secondary | ICD-10-CM | POA: Diagnosis not present

## 2018-09-01 DIAGNOSIS — N2581 Secondary hyperparathyroidism of renal origin: Secondary | ICD-10-CM | POA: Diagnosis not present

## 2018-09-01 DIAGNOSIS — D509 Iron deficiency anemia, unspecified: Secondary | ICD-10-CM | POA: Diagnosis not present

## 2018-09-01 DIAGNOSIS — N186 End stage renal disease: Secondary | ICD-10-CM | POA: Diagnosis not present

## 2018-09-02 DIAGNOSIS — N2581 Secondary hyperparathyroidism of renal origin: Secondary | ICD-10-CM | POA: Diagnosis not present

## 2018-09-02 DIAGNOSIS — Z992 Dependence on renal dialysis: Secondary | ICD-10-CM | POA: Diagnosis not present

## 2018-09-02 DIAGNOSIS — D631 Anemia in chronic kidney disease: Secondary | ICD-10-CM | POA: Diagnosis not present

## 2018-09-02 DIAGNOSIS — N186 End stage renal disease: Secondary | ICD-10-CM | POA: Diagnosis not present

## 2018-09-02 DIAGNOSIS — D509 Iron deficiency anemia, unspecified: Secondary | ICD-10-CM | POA: Diagnosis not present

## 2018-09-03 DIAGNOSIS — N2581 Secondary hyperparathyroidism of renal origin: Secondary | ICD-10-CM | POA: Diagnosis not present

## 2018-09-03 DIAGNOSIS — D631 Anemia in chronic kidney disease: Secondary | ICD-10-CM | POA: Diagnosis not present

## 2018-09-03 DIAGNOSIS — Z992 Dependence on renal dialysis: Secondary | ICD-10-CM | POA: Diagnosis not present

## 2018-09-03 DIAGNOSIS — N186 End stage renal disease: Secondary | ICD-10-CM | POA: Diagnosis not present

## 2018-09-03 DIAGNOSIS — D509 Iron deficiency anemia, unspecified: Secondary | ICD-10-CM | POA: Diagnosis not present

## 2018-09-04 DIAGNOSIS — D509 Iron deficiency anemia, unspecified: Secondary | ICD-10-CM | POA: Diagnosis not present

## 2018-09-04 DIAGNOSIS — D631 Anemia in chronic kidney disease: Secondary | ICD-10-CM | POA: Diagnosis not present

## 2018-09-04 DIAGNOSIS — N186 End stage renal disease: Secondary | ICD-10-CM | POA: Diagnosis not present

## 2018-09-04 DIAGNOSIS — N2581 Secondary hyperparathyroidism of renal origin: Secondary | ICD-10-CM | POA: Diagnosis not present

## 2018-09-04 DIAGNOSIS — Z992 Dependence on renal dialysis: Secondary | ICD-10-CM | POA: Diagnosis not present

## 2018-09-05 DIAGNOSIS — D631 Anemia in chronic kidney disease: Secondary | ICD-10-CM | POA: Diagnosis not present

## 2018-09-05 DIAGNOSIS — D509 Iron deficiency anemia, unspecified: Secondary | ICD-10-CM | POA: Diagnosis not present

## 2018-09-05 DIAGNOSIS — Z992 Dependence on renal dialysis: Secondary | ICD-10-CM | POA: Diagnosis not present

## 2018-09-05 DIAGNOSIS — N2581 Secondary hyperparathyroidism of renal origin: Secondary | ICD-10-CM | POA: Diagnosis not present

## 2018-09-05 DIAGNOSIS — N186 End stage renal disease: Secondary | ICD-10-CM | POA: Diagnosis not present

## 2018-09-06 DIAGNOSIS — N2581 Secondary hyperparathyroidism of renal origin: Secondary | ICD-10-CM | POA: Diagnosis not present

## 2018-09-06 DIAGNOSIS — D509 Iron deficiency anemia, unspecified: Secondary | ICD-10-CM | POA: Diagnosis not present

## 2018-09-06 DIAGNOSIS — N186 End stage renal disease: Secondary | ICD-10-CM | POA: Diagnosis not present

## 2018-09-06 DIAGNOSIS — D631 Anemia in chronic kidney disease: Secondary | ICD-10-CM | POA: Diagnosis not present

## 2018-09-06 DIAGNOSIS — Z992 Dependence on renal dialysis: Secondary | ICD-10-CM | POA: Diagnosis not present

## 2018-09-07 DIAGNOSIS — N186 End stage renal disease: Secondary | ICD-10-CM | POA: Diagnosis not present

## 2018-09-07 DIAGNOSIS — N2581 Secondary hyperparathyroidism of renal origin: Secondary | ICD-10-CM | POA: Diagnosis not present

## 2018-09-07 DIAGNOSIS — D509 Iron deficiency anemia, unspecified: Secondary | ICD-10-CM | POA: Diagnosis not present

## 2018-09-07 DIAGNOSIS — Z992 Dependence on renal dialysis: Secondary | ICD-10-CM | POA: Diagnosis not present

## 2018-09-07 DIAGNOSIS — D631 Anemia in chronic kidney disease: Secondary | ICD-10-CM | POA: Diagnosis not present

## 2018-09-08 DIAGNOSIS — N2581 Secondary hyperparathyroidism of renal origin: Secondary | ICD-10-CM | POA: Diagnosis not present

## 2018-09-08 DIAGNOSIS — N186 End stage renal disease: Secondary | ICD-10-CM | POA: Diagnosis not present

## 2018-09-08 DIAGNOSIS — D509 Iron deficiency anemia, unspecified: Secondary | ICD-10-CM | POA: Diagnosis not present

## 2018-09-08 DIAGNOSIS — Z992 Dependence on renal dialysis: Secondary | ICD-10-CM | POA: Diagnosis not present

## 2018-09-08 DIAGNOSIS — D631 Anemia in chronic kidney disease: Secondary | ICD-10-CM | POA: Diagnosis not present

## 2018-09-09 DIAGNOSIS — N2581 Secondary hyperparathyroidism of renal origin: Secondary | ICD-10-CM | POA: Diagnosis not present

## 2018-09-09 DIAGNOSIS — Z992 Dependence on renal dialysis: Secondary | ICD-10-CM | POA: Diagnosis not present

## 2018-09-09 DIAGNOSIS — D631 Anemia in chronic kidney disease: Secondary | ICD-10-CM | POA: Diagnosis not present

## 2018-09-09 DIAGNOSIS — N186 End stage renal disease: Secondary | ICD-10-CM | POA: Diagnosis not present

## 2018-09-09 DIAGNOSIS — D509 Iron deficiency anemia, unspecified: Secondary | ICD-10-CM | POA: Diagnosis not present

## 2018-09-10 DIAGNOSIS — Z992 Dependence on renal dialysis: Secondary | ICD-10-CM | POA: Diagnosis not present

## 2018-09-10 DIAGNOSIS — D509 Iron deficiency anemia, unspecified: Secondary | ICD-10-CM | POA: Diagnosis not present

## 2018-09-10 DIAGNOSIS — D631 Anemia in chronic kidney disease: Secondary | ICD-10-CM | POA: Diagnosis not present

## 2018-09-10 DIAGNOSIS — N186 End stage renal disease: Secondary | ICD-10-CM | POA: Diagnosis not present

## 2018-09-10 DIAGNOSIS — N2581 Secondary hyperparathyroidism of renal origin: Secondary | ICD-10-CM | POA: Diagnosis not present

## 2018-09-11 DIAGNOSIS — D509 Iron deficiency anemia, unspecified: Secondary | ICD-10-CM | POA: Diagnosis not present

## 2018-09-11 DIAGNOSIS — N2581 Secondary hyperparathyroidism of renal origin: Secondary | ICD-10-CM | POA: Diagnosis not present

## 2018-09-11 DIAGNOSIS — N186 End stage renal disease: Secondary | ICD-10-CM | POA: Diagnosis not present

## 2018-09-11 DIAGNOSIS — Z992 Dependence on renal dialysis: Secondary | ICD-10-CM | POA: Diagnosis not present

## 2018-09-11 DIAGNOSIS — D631 Anemia in chronic kidney disease: Secondary | ICD-10-CM | POA: Diagnosis not present

## 2018-09-12 DIAGNOSIS — Z992 Dependence on renal dialysis: Secondary | ICD-10-CM | POA: Diagnosis not present

## 2018-09-12 DIAGNOSIS — D631 Anemia in chronic kidney disease: Secondary | ICD-10-CM | POA: Diagnosis not present

## 2018-09-12 DIAGNOSIS — D509 Iron deficiency anemia, unspecified: Secondary | ICD-10-CM | POA: Diagnosis not present

## 2018-09-12 DIAGNOSIS — N2581 Secondary hyperparathyroidism of renal origin: Secondary | ICD-10-CM | POA: Diagnosis not present

## 2018-09-12 DIAGNOSIS — N186 End stage renal disease: Secondary | ICD-10-CM | POA: Diagnosis not present

## 2018-09-13 DIAGNOSIS — D509 Iron deficiency anemia, unspecified: Secondary | ICD-10-CM | POA: Diagnosis not present

## 2018-09-13 DIAGNOSIS — Z992 Dependence on renal dialysis: Secondary | ICD-10-CM | POA: Diagnosis not present

## 2018-09-13 DIAGNOSIS — N2581 Secondary hyperparathyroidism of renal origin: Secondary | ICD-10-CM | POA: Diagnosis not present

## 2018-09-13 DIAGNOSIS — N186 End stage renal disease: Secondary | ICD-10-CM | POA: Diagnosis not present

## 2018-09-13 DIAGNOSIS — D631 Anemia in chronic kidney disease: Secondary | ICD-10-CM | POA: Diagnosis not present

## 2018-09-14 DIAGNOSIS — Z992 Dependence on renal dialysis: Secondary | ICD-10-CM | POA: Diagnosis not present

## 2018-09-14 DIAGNOSIS — N2581 Secondary hyperparathyroidism of renal origin: Secondary | ICD-10-CM | POA: Diagnosis not present

## 2018-09-14 DIAGNOSIS — N186 End stage renal disease: Secondary | ICD-10-CM | POA: Diagnosis not present

## 2018-09-14 DIAGNOSIS — D509 Iron deficiency anemia, unspecified: Secondary | ICD-10-CM | POA: Diagnosis not present

## 2018-09-14 DIAGNOSIS — D631 Anemia in chronic kidney disease: Secondary | ICD-10-CM | POA: Diagnosis not present

## 2018-09-15 DIAGNOSIS — N2581 Secondary hyperparathyroidism of renal origin: Secondary | ICD-10-CM | POA: Diagnosis not present

## 2018-09-15 DIAGNOSIS — N186 End stage renal disease: Secondary | ICD-10-CM | POA: Diagnosis not present

## 2018-09-15 DIAGNOSIS — D631 Anemia in chronic kidney disease: Secondary | ICD-10-CM | POA: Diagnosis not present

## 2018-09-15 DIAGNOSIS — Z992 Dependence on renal dialysis: Secondary | ICD-10-CM | POA: Diagnosis not present

## 2018-09-15 DIAGNOSIS — D509 Iron deficiency anemia, unspecified: Secondary | ICD-10-CM | POA: Diagnosis not present

## 2018-09-16 DIAGNOSIS — Z992 Dependence on renal dialysis: Secondary | ICD-10-CM | POA: Diagnosis not present

## 2018-09-16 DIAGNOSIS — N2581 Secondary hyperparathyroidism of renal origin: Secondary | ICD-10-CM | POA: Diagnosis not present

## 2018-09-16 DIAGNOSIS — N186 End stage renal disease: Secondary | ICD-10-CM | POA: Diagnosis not present

## 2018-09-16 DIAGNOSIS — D509 Iron deficiency anemia, unspecified: Secondary | ICD-10-CM | POA: Diagnosis not present

## 2018-09-16 DIAGNOSIS — D631 Anemia in chronic kidney disease: Secondary | ICD-10-CM | POA: Diagnosis not present

## 2018-09-17 DIAGNOSIS — Z992 Dependence on renal dialysis: Secondary | ICD-10-CM | POA: Diagnosis not present

## 2018-09-17 DIAGNOSIS — N186 End stage renal disease: Secondary | ICD-10-CM | POA: Diagnosis not present

## 2018-09-17 DIAGNOSIS — D509 Iron deficiency anemia, unspecified: Secondary | ICD-10-CM | POA: Diagnosis not present

## 2018-09-17 DIAGNOSIS — D631 Anemia in chronic kidney disease: Secondary | ICD-10-CM | POA: Diagnosis not present

## 2018-09-17 DIAGNOSIS — N2581 Secondary hyperparathyroidism of renal origin: Secondary | ICD-10-CM | POA: Diagnosis not present

## 2018-09-18 DIAGNOSIS — D631 Anemia in chronic kidney disease: Secondary | ICD-10-CM | POA: Diagnosis not present

## 2018-09-18 DIAGNOSIS — N186 End stage renal disease: Secondary | ICD-10-CM | POA: Diagnosis not present

## 2018-09-18 DIAGNOSIS — D509 Iron deficiency anemia, unspecified: Secondary | ICD-10-CM | POA: Diagnosis not present

## 2018-09-18 DIAGNOSIS — N2581 Secondary hyperparathyroidism of renal origin: Secondary | ICD-10-CM | POA: Diagnosis not present

## 2018-09-18 DIAGNOSIS — Z992 Dependence on renal dialysis: Secondary | ICD-10-CM | POA: Diagnosis not present

## 2018-09-19 DIAGNOSIS — N186 End stage renal disease: Secondary | ICD-10-CM | POA: Diagnosis not present

## 2018-09-19 DIAGNOSIS — D509 Iron deficiency anemia, unspecified: Secondary | ICD-10-CM | POA: Diagnosis not present

## 2018-09-19 DIAGNOSIS — Z992 Dependence on renal dialysis: Secondary | ICD-10-CM | POA: Diagnosis not present

## 2018-09-19 DIAGNOSIS — N2581 Secondary hyperparathyroidism of renal origin: Secondary | ICD-10-CM | POA: Diagnosis not present

## 2018-09-19 DIAGNOSIS — D631 Anemia in chronic kidney disease: Secondary | ICD-10-CM | POA: Diagnosis not present

## 2018-09-20 DIAGNOSIS — D509 Iron deficiency anemia, unspecified: Secondary | ICD-10-CM | POA: Diagnosis not present

## 2018-09-20 DIAGNOSIS — D631 Anemia in chronic kidney disease: Secondary | ICD-10-CM | POA: Diagnosis not present

## 2018-09-20 DIAGNOSIS — N2581 Secondary hyperparathyroidism of renal origin: Secondary | ICD-10-CM | POA: Diagnosis not present

## 2018-09-20 DIAGNOSIS — Z992 Dependence on renal dialysis: Secondary | ICD-10-CM | POA: Diagnosis not present

## 2018-09-20 DIAGNOSIS — N186 End stage renal disease: Secondary | ICD-10-CM | POA: Diagnosis not present

## 2018-09-21 DIAGNOSIS — N186 End stage renal disease: Secondary | ICD-10-CM | POA: Diagnosis not present

## 2018-09-21 DIAGNOSIS — Z992 Dependence on renal dialysis: Secondary | ICD-10-CM | POA: Diagnosis not present

## 2018-09-21 DIAGNOSIS — D631 Anemia in chronic kidney disease: Secondary | ICD-10-CM | POA: Diagnosis not present

## 2018-09-21 DIAGNOSIS — D509 Iron deficiency anemia, unspecified: Secondary | ICD-10-CM | POA: Diagnosis not present

## 2018-09-21 DIAGNOSIS — N2581 Secondary hyperparathyroidism of renal origin: Secondary | ICD-10-CM | POA: Diagnosis not present

## 2018-09-22 DIAGNOSIS — D509 Iron deficiency anemia, unspecified: Secondary | ICD-10-CM | POA: Diagnosis not present

## 2018-09-22 DIAGNOSIS — Z992 Dependence on renal dialysis: Secondary | ICD-10-CM | POA: Diagnosis not present

## 2018-09-22 DIAGNOSIS — N186 End stage renal disease: Secondary | ICD-10-CM | POA: Diagnosis not present

## 2018-09-22 DIAGNOSIS — N2581 Secondary hyperparathyroidism of renal origin: Secondary | ICD-10-CM | POA: Diagnosis not present

## 2018-09-22 DIAGNOSIS — D631 Anemia in chronic kidney disease: Secondary | ICD-10-CM | POA: Diagnosis not present

## 2018-09-23 DIAGNOSIS — D509 Iron deficiency anemia, unspecified: Secondary | ICD-10-CM | POA: Diagnosis not present

## 2018-09-23 DIAGNOSIS — N2581 Secondary hyperparathyroidism of renal origin: Secondary | ICD-10-CM | POA: Diagnosis not present

## 2018-09-23 DIAGNOSIS — D631 Anemia in chronic kidney disease: Secondary | ICD-10-CM | POA: Diagnosis not present

## 2018-09-23 DIAGNOSIS — N186 End stage renal disease: Secondary | ICD-10-CM | POA: Diagnosis not present

## 2018-09-23 DIAGNOSIS — Z992 Dependence on renal dialysis: Secondary | ICD-10-CM | POA: Diagnosis not present

## 2018-09-24 DIAGNOSIS — N186 End stage renal disease: Secondary | ICD-10-CM | POA: Diagnosis not present

## 2018-09-24 DIAGNOSIS — D509 Iron deficiency anemia, unspecified: Secondary | ICD-10-CM | POA: Diagnosis not present

## 2018-09-24 DIAGNOSIS — Z992 Dependence on renal dialysis: Secondary | ICD-10-CM | POA: Diagnosis not present

## 2018-09-24 DIAGNOSIS — N2581 Secondary hyperparathyroidism of renal origin: Secondary | ICD-10-CM | POA: Diagnosis not present

## 2018-09-24 DIAGNOSIS — D631 Anemia in chronic kidney disease: Secondary | ICD-10-CM | POA: Diagnosis not present

## 2018-09-25 DIAGNOSIS — D509 Iron deficiency anemia, unspecified: Secondary | ICD-10-CM | POA: Diagnosis not present

## 2018-09-25 DIAGNOSIS — D631 Anemia in chronic kidney disease: Secondary | ICD-10-CM | POA: Diagnosis not present

## 2018-09-25 DIAGNOSIS — N186 End stage renal disease: Secondary | ICD-10-CM | POA: Diagnosis not present

## 2018-09-25 DIAGNOSIS — Z992 Dependence on renal dialysis: Secondary | ICD-10-CM | POA: Diagnosis not present

## 2018-09-25 DIAGNOSIS — N2581 Secondary hyperparathyroidism of renal origin: Secondary | ICD-10-CM | POA: Diagnosis not present

## 2018-09-26 DIAGNOSIS — N186 End stage renal disease: Secondary | ICD-10-CM | POA: Diagnosis not present

## 2018-09-26 DIAGNOSIS — D631 Anemia in chronic kidney disease: Secondary | ICD-10-CM | POA: Diagnosis not present

## 2018-09-26 DIAGNOSIS — Z992 Dependence on renal dialysis: Secondary | ICD-10-CM | POA: Diagnosis not present

## 2018-09-26 DIAGNOSIS — N2581 Secondary hyperparathyroidism of renal origin: Secondary | ICD-10-CM | POA: Diagnosis not present

## 2018-09-26 DIAGNOSIS — D509 Iron deficiency anemia, unspecified: Secondary | ICD-10-CM | POA: Diagnosis not present

## 2018-09-27 DIAGNOSIS — N186 End stage renal disease: Secondary | ICD-10-CM | POA: Diagnosis not present

## 2018-09-27 DIAGNOSIS — Z992 Dependence on renal dialysis: Secondary | ICD-10-CM | POA: Diagnosis not present

## 2018-09-27 DIAGNOSIS — D509 Iron deficiency anemia, unspecified: Secondary | ICD-10-CM | POA: Diagnosis not present

## 2018-09-27 DIAGNOSIS — N2581 Secondary hyperparathyroidism of renal origin: Secondary | ICD-10-CM | POA: Diagnosis not present

## 2018-09-27 DIAGNOSIS — D631 Anemia in chronic kidney disease: Secondary | ICD-10-CM | POA: Diagnosis not present

## 2018-09-28 DIAGNOSIS — Z992 Dependence on renal dialysis: Secondary | ICD-10-CM | POA: Diagnosis not present

## 2018-09-28 DIAGNOSIS — N186 End stage renal disease: Secondary | ICD-10-CM | POA: Diagnosis not present

## 2018-09-28 DIAGNOSIS — D631 Anemia in chronic kidney disease: Secondary | ICD-10-CM | POA: Diagnosis not present

## 2018-09-28 DIAGNOSIS — N2581 Secondary hyperparathyroidism of renal origin: Secondary | ICD-10-CM | POA: Diagnosis not present

## 2018-09-28 DIAGNOSIS — D509 Iron deficiency anemia, unspecified: Secondary | ICD-10-CM | POA: Diagnosis not present

## 2018-09-29 DIAGNOSIS — N2581 Secondary hyperparathyroidism of renal origin: Secondary | ICD-10-CM | POA: Diagnosis not present

## 2018-09-29 DIAGNOSIS — D631 Anemia in chronic kidney disease: Secondary | ICD-10-CM | POA: Diagnosis not present

## 2018-09-29 DIAGNOSIS — D509 Iron deficiency anemia, unspecified: Secondary | ICD-10-CM | POA: Diagnosis not present

## 2018-09-29 DIAGNOSIS — Z992 Dependence on renal dialysis: Secondary | ICD-10-CM | POA: Diagnosis not present

## 2018-09-29 DIAGNOSIS — N186 End stage renal disease: Secondary | ICD-10-CM | POA: Diagnosis not present

## 2018-09-30 DIAGNOSIS — N186 End stage renal disease: Secondary | ICD-10-CM | POA: Diagnosis not present

## 2018-09-30 DIAGNOSIS — D509 Iron deficiency anemia, unspecified: Secondary | ICD-10-CM | POA: Diagnosis not present

## 2018-09-30 DIAGNOSIS — Z992 Dependence on renal dialysis: Secondary | ICD-10-CM | POA: Diagnosis not present

## 2018-09-30 DIAGNOSIS — D631 Anemia in chronic kidney disease: Secondary | ICD-10-CM | POA: Diagnosis not present

## 2018-09-30 DIAGNOSIS — N2581 Secondary hyperparathyroidism of renal origin: Secondary | ICD-10-CM | POA: Diagnosis not present

## 2018-10-01 DIAGNOSIS — Z992 Dependence on renal dialysis: Secondary | ICD-10-CM | POA: Diagnosis not present

## 2018-10-01 DIAGNOSIS — N186 End stage renal disease: Secondary | ICD-10-CM | POA: Diagnosis not present

## 2018-10-01 DIAGNOSIS — D509 Iron deficiency anemia, unspecified: Secondary | ICD-10-CM | POA: Diagnosis not present

## 2018-10-01 DIAGNOSIS — N2581 Secondary hyperparathyroidism of renal origin: Secondary | ICD-10-CM | POA: Diagnosis not present

## 2018-10-01 DIAGNOSIS — D631 Anemia in chronic kidney disease: Secondary | ICD-10-CM | POA: Diagnosis not present

## 2018-10-02 DIAGNOSIS — Z992 Dependence on renal dialysis: Secondary | ICD-10-CM | POA: Diagnosis not present

## 2018-10-02 DIAGNOSIS — N186 End stage renal disease: Secondary | ICD-10-CM | POA: Diagnosis not present

## 2018-10-02 DIAGNOSIS — D509 Iron deficiency anemia, unspecified: Secondary | ICD-10-CM | POA: Diagnosis not present

## 2018-10-02 DIAGNOSIS — N2581 Secondary hyperparathyroidism of renal origin: Secondary | ICD-10-CM | POA: Diagnosis not present

## 2018-10-02 DIAGNOSIS — D631 Anemia in chronic kidney disease: Secondary | ICD-10-CM | POA: Diagnosis not present

## 2018-10-03 DIAGNOSIS — D509 Iron deficiency anemia, unspecified: Secondary | ICD-10-CM | POA: Diagnosis not present

## 2018-10-03 DIAGNOSIS — N2581 Secondary hyperparathyroidism of renal origin: Secondary | ICD-10-CM | POA: Diagnosis not present

## 2018-10-03 DIAGNOSIS — D631 Anemia in chronic kidney disease: Secondary | ICD-10-CM | POA: Diagnosis not present

## 2018-10-03 DIAGNOSIS — N186 End stage renal disease: Secondary | ICD-10-CM | POA: Diagnosis not present

## 2018-10-03 DIAGNOSIS — Z992 Dependence on renal dialysis: Secondary | ICD-10-CM | POA: Diagnosis not present

## 2018-10-04 DIAGNOSIS — I120 Hypertensive chronic kidney disease with stage 5 chronic kidney disease or end stage renal disease: Secondary | ICD-10-CM | POA: Diagnosis not present

## 2018-10-04 DIAGNOSIS — N186 End stage renal disease: Secondary | ICD-10-CM | POA: Diagnosis not present

## 2018-10-04 DIAGNOSIS — N2581 Secondary hyperparathyroidism of renal origin: Secondary | ICD-10-CM | POA: Diagnosis not present

## 2018-10-04 DIAGNOSIS — M2242 Chondromalacia patellae, left knee: Secondary | ICD-10-CM | POA: Diagnosis not present

## 2018-10-04 DIAGNOSIS — Z992 Dependence on renal dialysis: Secondary | ICD-10-CM | POA: Diagnosis not present

## 2018-10-04 DIAGNOSIS — D631 Anemia in chronic kidney disease: Secondary | ICD-10-CM | POA: Diagnosis not present

## 2018-10-04 DIAGNOSIS — M7989 Other specified soft tissue disorders: Secondary | ICD-10-CM | POA: Diagnosis not present

## 2018-10-04 DIAGNOSIS — D509 Iron deficiency anemia, unspecified: Secondary | ICD-10-CM | POA: Diagnosis not present

## 2018-10-04 DIAGNOSIS — N189 Chronic kidney disease, unspecified: Secondary | ICD-10-CM | POA: Diagnosis not present

## 2018-10-04 DIAGNOSIS — M79662 Pain in left lower leg: Secondary | ICD-10-CM | POA: Diagnosis not present

## 2018-10-04 DIAGNOSIS — M79605 Pain in left leg: Secondary | ICD-10-CM | POA: Diagnosis not present

## 2018-10-05 DIAGNOSIS — D631 Anemia in chronic kidney disease: Secondary | ICD-10-CM | POA: Diagnosis not present

## 2018-10-05 DIAGNOSIS — N2581 Secondary hyperparathyroidism of renal origin: Secondary | ICD-10-CM | POA: Diagnosis not present

## 2018-10-05 DIAGNOSIS — N186 End stage renal disease: Secondary | ICD-10-CM | POA: Diagnosis not present

## 2018-10-05 DIAGNOSIS — D509 Iron deficiency anemia, unspecified: Secondary | ICD-10-CM | POA: Diagnosis not present

## 2018-10-05 DIAGNOSIS — Z992 Dependence on renal dialysis: Secondary | ICD-10-CM | POA: Diagnosis not present

## 2018-10-06 DIAGNOSIS — N186 End stage renal disease: Secondary | ICD-10-CM | POA: Diagnosis not present

## 2018-10-06 DIAGNOSIS — Z992 Dependence on renal dialysis: Secondary | ICD-10-CM | POA: Diagnosis not present

## 2018-10-06 DIAGNOSIS — D509 Iron deficiency anemia, unspecified: Secondary | ICD-10-CM | POA: Diagnosis not present

## 2018-10-06 DIAGNOSIS — D631 Anemia in chronic kidney disease: Secondary | ICD-10-CM | POA: Diagnosis not present

## 2018-10-06 DIAGNOSIS — N2581 Secondary hyperparathyroidism of renal origin: Secondary | ICD-10-CM | POA: Diagnosis not present

## 2018-10-07 DIAGNOSIS — N186 End stage renal disease: Secondary | ICD-10-CM | POA: Diagnosis not present

## 2018-10-07 DIAGNOSIS — D631 Anemia in chronic kidney disease: Secondary | ICD-10-CM | POA: Diagnosis not present

## 2018-10-07 DIAGNOSIS — D509 Iron deficiency anemia, unspecified: Secondary | ICD-10-CM | POA: Diagnosis not present

## 2018-10-07 DIAGNOSIS — N2581 Secondary hyperparathyroidism of renal origin: Secondary | ICD-10-CM | POA: Diagnosis not present

## 2018-10-07 DIAGNOSIS — Z992 Dependence on renal dialysis: Secondary | ICD-10-CM | POA: Diagnosis not present

## 2018-10-08 DIAGNOSIS — Z992 Dependence on renal dialysis: Secondary | ICD-10-CM | POA: Diagnosis not present

## 2018-10-08 DIAGNOSIS — D509 Iron deficiency anemia, unspecified: Secondary | ICD-10-CM | POA: Diagnosis not present

## 2018-10-08 DIAGNOSIS — N186 End stage renal disease: Secondary | ICD-10-CM | POA: Diagnosis not present

## 2018-10-08 DIAGNOSIS — N2581 Secondary hyperparathyroidism of renal origin: Secondary | ICD-10-CM | POA: Diagnosis not present

## 2018-10-08 DIAGNOSIS — D631 Anemia in chronic kidney disease: Secondary | ICD-10-CM | POA: Diagnosis not present

## 2018-10-09 DIAGNOSIS — D631 Anemia in chronic kidney disease: Secondary | ICD-10-CM | POA: Diagnosis not present

## 2018-10-09 DIAGNOSIS — Z992 Dependence on renal dialysis: Secondary | ICD-10-CM | POA: Diagnosis not present

## 2018-10-09 DIAGNOSIS — D509 Iron deficiency anemia, unspecified: Secondary | ICD-10-CM | POA: Diagnosis not present

## 2018-10-09 DIAGNOSIS — N186 End stage renal disease: Secondary | ICD-10-CM | POA: Diagnosis not present

## 2018-10-09 DIAGNOSIS — N2581 Secondary hyperparathyroidism of renal origin: Secondary | ICD-10-CM | POA: Diagnosis not present

## 2018-10-10 DIAGNOSIS — D509 Iron deficiency anemia, unspecified: Secondary | ICD-10-CM | POA: Diagnosis not present

## 2018-10-10 DIAGNOSIS — Z992 Dependence on renal dialysis: Secondary | ICD-10-CM | POA: Diagnosis not present

## 2018-10-10 DIAGNOSIS — N2581 Secondary hyperparathyroidism of renal origin: Secondary | ICD-10-CM | POA: Diagnosis not present

## 2018-10-10 DIAGNOSIS — D631 Anemia in chronic kidney disease: Secondary | ICD-10-CM | POA: Diagnosis not present

## 2018-10-10 DIAGNOSIS — N186 End stage renal disease: Secondary | ICD-10-CM | POA: Diagnosis not present

## 2018-10-11 DIAGNOSIS — N186 End stage renal disease: Secondary | ICD-10-CM | POA: Diagnosis not present

## 2018-10-11 DIAGNOSIS — D631 Anemia in chronic kidney disease: Secondary | ICD-10-CM | POA: Diagnosis not present

## 2018-10-11 DIAGNOSIS — Z992 Dependence on renal dialysis: Secondary | ICD-10-CM | POA: Diagnosis not present

## 2018-10-11 DIAGNOSIS — N2581 Secondary hyperparathyroidism of renal origin: Secondary | ICD-10-CM | POA: Diagnosis not present

## 2018-10-11 DIAGNOSIS — D509 Iron deficiency anemia, unspecified: Secondary | ICD-10-CM | POA: Diagnosis not present

## 2018-10-12 DIAGNOSIS — Z992 Dependence on renal dialysis: Secondary | ICD-10-CM | POA: Diagnosis not present

## 2018-10-12 DIAGNOSIS — N186 End stage renal disease: Secondary | ICD-10-CM | POA: Diagnosis not present

## 2018-10-12 DIAGNOSIS — D509 Iron deficiency anemia, unspecified: Secondary | ICD-10-CM | POA: Diagnosis not present

## 2018-10-12 DIAGNOSIS — N2581 Secondary hyperparathyroidism of renal origin: Secondary | ICD-10-CM | POA: Diagnosis not present

## 2018-10-12 DIAGNOSIS — D631 Anemia in chronic kidney disease: Secondary | ICD-10-CM | POA: Diagnosis not present

## 2018-10-13 DIAGNOSIS — N2581 Secondary hyperparathyroidism of renal origin: Secondary | ICD-10-CM | POA: Diagnosis not present

## 2018-10-13 DIAGNOSIS — D509 Iron deficiency anemia, unspecified: Secondary | ICD-10-CM | POA: Diagnosis not present

## 2018-10-13 DIAGNOSIS — D631 Anemia in chronic kidney disease: Secondary | ICD-10-CM | POA: Diagnosis not present

## 2018-10-13 DIAGNOSIS — Z992 Dependence on renal dialysis: Secondary | ICD-10-CM | POA: Diagnosis not present

## 2018-10-13 DIAGNOSIS — N186 End stage renal disease: Secondary | ICD-10-CM | POA: Diagnosis not present

## 2018-10-14 DIAGNOSIS — N186 End stage renal disease: Secondary | ICD-10-CM | POA: Diagnosis not present

## 2018-10-14 DIAGNOSIS — L03119 Cellulitis of unspecified part of limb: Secondary | ICD-10-CM | POA: Diagnosis not present

## 2018-10-14 DIAGNOSIS — R110 Nausea: Secondary | ICD-10-CM | POA: Diagnosis not present

## 2018-10-14 DIAGNOSIS — N2581 Secondary hyperparathyroidism of renal origin: Secondary | ICD-10-CM | POA: Diagnosis not present

## 2018-10-14 DIAGNOSIS — Z7689 Persons encountering health services in other specified circumstances: Secondary | ICD-10-CM | POA: Diagnosis not present

## 2018-10-14 DIAGNOSIS — D631 Anemia in chronic kidney disease: Secondary | ICD-10-CM | POA: Diagnosis not present

## 2018-10-14 DIAGNOSIS — D509 Iron deficiency anemia, unspecified: Secondary | ICD-10-CM | POA: Diagnosis not present

## 2018-10-14 DIAGNOSIS — Z992 Dependence on renal dialysis: Secondary | ICD-10-CM | POA: Diagnosis not present

## 2018-10-15 DIAGNOSIS — N2581 Secondary hyperparathyroidism of renal origin: Secondary | ICD-10-CM | POA: Diagnosis not present

## 2018-10-15 DIAGNOSIS — D509 Iron deficiency anemia, unspecified: Secondary | ICD-10-CM | POA: Diagnosis not present

## 2018-10-15 DIAGNOSIS — Z992 Dependence on renal dialysis: Secondary | ICD-10-CM | POA: Diagnosis not present

## 2018-10-15 DIAGNOSIS — N186 End stage renal disease: Secondary | ICD-10-CM | POA: Diagnosis not present

## 2018-10-15 DIAGNOSIS — D631 Anemia in chronic kidney disease: Secondary | ICD-10-CM | POA: Diagnosis not present

## 2018-10-16 DIAGNOSIS — Z992 Dependence on renal dialysis: Secondary | ICD-10-CM | POA: Diagnosis not present

## 2018-10-16 DIAGNOSIS — N186 End stage renal disease: Secondary | ICD-10-CM | POA: Diagnosis not present

## 2018-10-16 DIAGNOSIS — N2581 Secondary hyperparathyroidism of renal origin: Secondary | ICD-10-CM | POA: Diagnosis not present

## 2018-10-16 DIAGNOSIS — D631 Anemia in chronic kidney disease: Secondary | ICD-10-CM | POA: Diagnosis not present

## 2018-10-16 DIAGNOSIS — D509 Iron deficiency anemia, unspecified: Secondary | ICD-10-CM | POA: Diagnosis not present

## 2018-10-17 DIAGNOSIS — N2581 Secondary hyperparathyroidism of renal origin: Secondary | ICD-10-CM | POA: Diagnosis not present

## 2018-10-17 DIAGNOSIS — N186 End stage renal disease: Secondary | ICD-10-CM | POA: Diagnosis not present

## 2018-10-17 DIAGNOSIS — Z992 Dependence on renal dialysis: Secondary | ICD-10-CM | POA: Diagnosis not present

## 2018-10-17 DIAGNOSIS — D631 Anemia in chronic kidney disease: Secondary | ICD-10-CM | POA: Diagnosis not present

## 2018-10-17 DIAGNOSIS — D509 Iron deficiency anemia, unspecified: Secondary | ICD-10-CM | POA: Diagnosis not present

## 2018-10-18 DIAGNOSIS — N2581 Secondary hyperparathyroidism of renal origin: Secondary | ICD-10-CM | POA: Diagnosis not present

## 2018-10-18 DIAGNOSIS — N186 End stage renal disease: Secondary | ICD-10-CM | POA: Diagnosis not present

## 2018-10-18 DIAGNOSIS — D509 Iron deficiency anemia, unspecified: Secondary | ICD-10-CM | POA: Diagnosis not present

## 2018-10-18 DIAGNOSIS — D631 Anemia in chronic kidney disease: Secondary | ICD-10-CM | POA: Diagnosis not present

## 2018-10-18 DIAGNOSIS — Z992 Dependence on renal dialysis: Secondary | ICD-10-CM | POA: Diagnosis not present

## 2018-10-19 DIAGNOSIS — D631 Anemia in chronic kidney disease: Secondary | ICD-10-CM | POA: Diagnosis not present

## 2018-10-19 DIAGNOSIS — D509 Iron deficiency anemia, unspecified: Secondary | ICD-10-CM | POA: Diagnosis not present

## 2018-10-19 DIAGNOSIS — Z992 Dependence on renal dialysis: Secondary | ICD-10-CM | POA: Diagnosis not present

## 2018-10-19 DIAGNOSIS — N2581 Secondary hyperparathyroidism of renal origin: Secondary | ICD-10-CM | POA: Diagnosis not present

## 2018-10-19 DIAGNOSIS — N186 End stage renal disease: Secondary | ICD-10-CM | POA: Diagnosis not present

## 2018-10-20 DIAGNOSIS — D509 Iron deficiency anemia, unspecified: Secondary | ICD-10-CM | POA: Diagnosis not present

## 2018-10-20 DIAGNOSIS — Z992 Dependence on renal dialysis: Secondary | ICD-10-CM | POA: Diagnosis not present

## 2018-10-20 DIAGNOSIS — N2581 Secondary hyperparathyroidism of renal origin: Secondary | ICD-10-CM | POA: Diagnosis not present

## 2018-10-20 DIAGNOSIS — N186 End stage renal disease: Secondary | ICD-10-CM | POA: Diagnosis not present

## 2018-10-20 DIAGNOSIS — D631 Anemia in chronic kidney disease: Secondary | ICD-10-CM | POA: Diagnosis not present

## 2018-10-21 DIAGNOSIS — N2581 Secondary hyperparathyroidism of renal origin: Secondary | ICD-10-CM | POA: Diagnosis not present

## 2018-10-21 DIAGNOSIS — N186 End stage renal disease: Secondary | ICD-10-CM | POA: Diagnosis not present

## 2018-10-21 DIAGNOSIS — Z992 Dependence on renal dialysis: Secondary | ICD-10-CM | POA: Diagnosis not present

## 2018-10-21 DIAGNOSIS — D631 Anemia in chronic kidney disease: Secondary | ICD-10-CM | POA: Diagnosis not present

## 2018-10-21 DIAGNOSIS — D509 Iron deficiency anemia, unspecified: Secondary | ICD-10-CM | POA: Diagnosis not present

## 2018-10-22 DIAGNOSIS — Z992 Dependence on renal dialysis: Secondary | ICD-10-CM | POA: Diagnosis not present

## 2018-10-22 DIAGNOSIS — N2581 Secondary hyperparathyroidism of renal origin: Secondary | ICD-10-CM | POA: Diagnosis not present

## 2018-10-22 DIAGNOSIS — D509 Iron deficiency anemia, unspecified: Secondary | ICD-10-CM | POA: Diagnosis not present

## 2018-10-22 DIAGNOSIS — N185 Chronic kidney disease, stage 5: Secondary | ICD-10-CM | POA: Diagnosis not present

## 2018-10-22 DIAGNOSIS — L03119 Cellulitis of unspecified part of limb: Secondary | ICD-10-CM | POA: Diagnosis not present

## 2018-10-22 DIAGNOSIS — I998 Other disorder of circulatory system: Secondary | ICD-10-CM | POA: Diagnosis not present

## 2018-10-22 DIAGNOSIS — D631 Anemia in chronic kidney disease: Secondary | ICD-10-CM | POA: Diagnosis not present

## 2018-10-22 DIAGNOSIS — N186 End stage renal disease: Secondary | ICD-10-CM | POA: Diagnosis not present

## 2018-10-23 DIAGNOSIS — D631 Anemia in chronic kidney disease: Secondary | ICD-10-CM | POA: Diagnosis not present

## 2018-10-23 DIAGNOSIS — N2581 Secondary hyperparathyroidism of renal origin: Secondary | ICD-10-CM | POA: Diagnosis not present

## 2018-10-23 DIAGNOSIS — Z992 Dependence on renal dialysis: Secondary | ICD-10-CM | POA: Diagnosis not present

## 2018-10-23 DIAGNOSIS — D509 Iron deficiency anemia, unspecified: Secondary | ICD-10-CM | POA: Diagnosis not present

## 2018-10-23 DIAGNOSIS — N186 End stage renal disease: Secondary | ICD-10-CM | POA: Diagnosis not present

## 2018-10-24 DIAGNOSIS — Z992 Dependence on renal dialysis: Secondary | ICD-10-CM | POA: Diagnosis not present

## 2018-10-24 DIAGNOSIS — N2581 Secondary hyperparathyroidism of renal origin: Secondary | ICD-10-CM | POA: Diagnosis not present

## 2018-10-24 DIAGNOSIS — D509 Iron deficiency anemia, unspecified: Secondary | ICD-10-CM | POA: Diagnosis not present

## 2018-10-24 DIAGNOSIS — N186 End stage renal disease: Secondary | ICD-10-CM | POA: Diagnosis not present

## 2018-10-24 DIAGNOSIS — D631 Anemia in chronic kidney disease: Secondary | ICD-10-CM | POA: Diagnosis not present

## 2018-10-25 DIAGNOSIS — N2581 Secondary hyperparathyroidism of renal origin: Secondary | ICD-10-CM | POA: Diagnosis not present

## 2018-10-25 DIAGNOSIS — Z992 Dependence on renal dialysis: Secondary | ICD-10-CM | POA: Diagnosis not present

## 2018-10-25 DIAGNOSIS — N186 End stage renal disease: Secondary | ICD-10-CM | POA: Diagnosis not present

## 2018-10-25 DIAGNOSIS — D631 Anemia in chronic kidney disease: Secondary | ICD-10-CM | POA: Diagnosis not present

## 2018-10-25 DIAGNOSIS — D509 Iron deficiency anemia, unspecified: Secondary | ICD-10-CM | POA: Diagnosis not present

## 2018-10-26 DIAGNOSIS — N186 End stage renal disease: Secondary | ICD-10-CM | POA: Diagnosis not present

## 2018-10-26 DIAGNOSIS — D509 Iron deficiency anemia, unspecified: Secondary | ICD-10-CM | POA: Diagnosis not present

## 2018-10-26 DIAGNOSIS — Z992 Dependence on renal dialysis: Secondary | ICD-10-CM | POA: Diagnosis not present

## 2018-10-26 DIAGNOSIS — D631 Anemia in chronic kidney disease: Secondary | ICD-10-CM | POA: Diagnosis not present

## 2018-10-26 DIAGNOSIS — N2581 Secondary hyperparathyroidism of renal origin: Secondary | ICD-10-CM | POA: Diagnosis not present

## 2018-10-27 DIAGNOSIS — Z992 Dependence on renal dialysis: Secondary | ICD-10-CM | POA: Diagnosis not present

## 2018-10-27 DIAGNOSIS — D631 Anemia in chronic kidney disease: Secondary | ICD-10-CM | POA: Diagnosis not present

## 2018-10-27 DIAGNOSIS — D509 Iron deficiency anemia, unspecified: Secondary | ICD-10-CM | POA: Diagnosis not present

## 2018-10-27 DIAGNOSIS — N2581 Secondary hyperparathyroidism of renal origin: Secondary | ICD-10-CM | POA: Diagnosis not present

## 2018-10-27 DIAGNOSIS — N186 End stage renal disease: Secondary | ICD-10-CM | POA: Diagnosis not present

## 2018-10-28 DIAGNOSIS — D509 Iron deficiency anemia, unspecified: Secondary | ICD-10-CM | POA: Diagnosis not present

## 2018-10-28 DIAGNOSIS — N2581 Secondary hyperparathyroidism of renal origin: Secondary | ICD-10-CM | POA: Diagnosis not present

## 2018-10-28 DIAGNOSIS — N186 End stage renal disease: Secondary | ICD-10-CM | POA: Diagnosis not present

## 2018-10-28 DIAGNOSIS — Z992 Dependence on renal dialysis: Secondary | ICD-10-CM | POA: Diagnosis not present

## 2018-10-28 DIAGNOSIS — D631 Anemia in chronic kidney disease: Secondary | ICD-10-CM | POA: Diagnosis not present

## 2018-10-29 DIAGNOSIS — Z992 Dependence on renal dialysis: Secondary | ICD-10-CM | POA: Diagnosis not present

## 2018-10-29 DIAGNOSIS — D509 Iron deficiency anemia, unspecified: Secondary | ICD-10-CM | POA: Diagnosis not present

## 2018-10-29 DIAGNOSIS — N186 End stage renal disease: Secondary | ICD-10-CM | POA: Diagnosis not present

## 2018-10-29 DIAGNOSIS — D631 Anemia in chronic kidney disease: Secondary | ICD-10-CM | POA: Diagnosis not present

## 2018-10-29 DIAGNOSIS — N2581 Secondary hyperparathyroidism of renal origin: Secondary | ICD-10-CM | POA: Diagnosis not present

## 2018-10-30 DIAGNOSIS — N186 End stage renal disease: Secondary | ICD-10-CM | POA: Diagnosis not present

## 2018-10-30 DIAGNOSIS — D631 Anemia in chronic kidney disease: Secondary | ICD-10-CM | POA: Diagnosis not present

## 2018-10-30 DIAGNOSIS — D509 Iron deficiency anemia, unspecified: Secondary | ICD-10-CM | POA: Diagnosis not present

## 2018-10-30 DIAGNOSIS — N2581 Secondary hyperparathyroidism of renal origin: Secondary | ICD-10-CM | POA: Diagnosis not present

## 2018-10-30 DIAGNOSIS — Z992 Dependence on renal dialysis: Secondary | ICD-10-CM | POA: Diagnosis not present

## 2018-10-31 DIAGNOSIS — M10072 Idiopathic gout, left ankle and foot: Secondary | ICD-10-CM | POA: Diagnosis not present

## 2018-10-31 DIAGNOSIS — D509 Iron deficiency anemia, unspecified: Secondary | ICD-10-CM | POA: Diagnosis not present

## 2018-10-31 DIAGNOSIS — Z86718 Personal history of other venous thrombosis and embolism: Secondary | ICD-10-CM | POA: Diagnosis not present

## 2018-10-31 DIAGNOSIS — N186 End stage renal disease: Secondary | ICD-10-CM | POA: Diagnosis not present

## 2018-10-31 DIAGNOSIS — R0602 Shortness of breath: Secondary | ICD-10-CM | POA: Diagnosis not present

## 2018-10-31 DIAGNOSIS — M7989 Other specified soft tissue disorders: Secondary | ICD-10-CM | POA: Diagnosis not present

## 2018-10-31 DIAGNOSIS — R0600 Dyspnea, unspecified: Secondary | ICD-10-CM | POA: Diagnosis not present

## 2018-10-31 DIAGNOSIS — I10 Essential (primary) hypertension: Secondary | ICD-10-CM | POA: Diagnosis not present

## 2018-10-31 DIAGNOSIS — D631 Anemia in chronic kidney disease: Secondary | ICD-10-CM | POA: Diagnosis not present

## 2018-10-31 DIAGNOSIS — Z992 Dependence on renal dialysis: Secondary | ICD-10-CM | POA: Diagnosis not present

## 2018-10-31 DIAGNOSIS — N2581 Secondary hyperparathyroidism of renal origin: Secondary | ICD-10-CM | POA: Diagnosis not present

## 2018-10-31 DIAGNOSIS — M79605 Pain in left leg: Secondary | ICD-10-CM | POA: Diagnosis not present

## 2018-11-01 DIAGNOSIS — N186 End stage renal disease: Secondary | ICD-10-CM | POA: Diagnosis not present

## 2018-11-01 DIAGNOSIS — D631 Anemia in chronic kidney disease: Secondary | ICD-10-CM | POA: Diagnosis not present

## 2018-11-01 DIAGNOSIS — Z992 Dependence on renal dialysis: Secondary | ICD-10-CM | POA: Diagnosis not present

## 2018-11-01 DIAGNOSIS — D509 Iron deficiency anemia, unspecified: Secondary | ICD-10-CM | POA: Diagnosis not present

## 2018-11-01 DIAGNOSIS — N2581 Secondary hyperparathyroidism of renal origin: Secondary | ICD-10-CM | POA: Diagnosis not present

## 2018-11-02 DIAGNOSIS — D509 Iron deficiency anemia, unspecified: Secondary | ICD-10-CM | POA: Diagnosis not present

## 2018-11-02 DIAGNOSIS — Z992 Dependence on renal dialysis: Secondary | ICD-10-CM | POA: Diagnosis not present

## 2018-11-02 DIAGNOSIS — N186 End stage renal disease: Secondary | ICD-10-CM | POA: Diagnosis not present

## 2018-11-02 DIAGNOSIS — N2581 Secondary hyperparathyroidism of renal origin: Secondary | ICD-10-CM | POA: Diagnosis not present

## 2018-11-02 DIAGNOSIS — D631 Anemia in chronic kidney disease: Secondary | ICD-10-CM | POA: Diagnosis not present

## 2018-11-03 DIAGNOSIS — N186 End stage renal disease: Secondary | ICD-10-CM | POA: Diagnosis not present

## 2018-11-03 DIAGNOSIS — N2581 Secondary hyperparathyroidism of renal origin: Secondary | ICD-10-CM | POA: Diagnosis not present

## 2018-11-03 DIAGNOSIS — Z992 Dependence on renal dialysis: Secondary | ICD-10-CM | POA: Diagnosis not present

## 2018-11-03 DIAGNOSIS — D631 Anemia in chronic kidney disease: Secondary | ICD-10-CM | POA: Diagnosis not present

## 2018-11-03 DIAGNOSIS — D509 Iron deficiency anemia, unspecified: Secondary | ICD-10-CM | POA: Diagnosis not present

## 2018-11-04 DIAGNOSIS — D631 Anemia in chronic kidney disease: Secondary | ICD-10-CM | POA: Diagnosis not present

## 2018-11-04 DIAGNOSIS — D509 Iron deficiency anemia, unspecified: Secondary | ICD-10-CM | POA: Diagnosis not present

## 2018-11-04 DIAGNOSIS — N2581 Secondary hyperparathyroidism of renal origin: Secondary | ICD-10-CM | POA: Diagnosis not present

## 2018-11-04 DIAGNOSIS — N186 End stage renal disease: Secondary | ICD-10-CM | POA: Diagnosis not present

## 2018-11-04 DIAGNOSIS — Z992 Dependence on renal dialysis: Secondary | ICD-10-CM | POA: Diagnosis not present

## 2018-11-05 DIAGNOSIS — N186 End stage renal disease: Secondary | ICD-10-CM | POA: Diagnosis not present

## 2018-11-05 DIAGNOSIS — D631 Anemia in chronic kidney disease: Secondary | ICD-10-CM | POA: Diagnosis not present

## 2018-11-05 DIAGNOSIS — D509 Iron deficiency anemia, unspecified: Secondary | ICD-10-CM | POA: Diagnosis not present

## 2018-11-05 DIAGNOSIS — N2581 Secondary hyperparathyroidism of renal origin: Secondary | ICD-10-CM | POA: Diagnosis not present

## 2018-11-05 DIAGNOSIS — Z992 Dependence on renal dialysis: Secondary | ICD-10-CM | POA: Diagnosis not present

## 2018-11-06 DIAGNOSIS — N186 End stage renal disease: Secondary | ICD-10-CM | POA: Diagnosis not present

## 2018-11-06 DIAGNOSIS — D509 Iron deficiency anemia, unspecified: Secondary | ICD-10-CM | POA: Diagnosis not present

## 2018-11-06 DIAGNOSIS — Z992 Dependence on renal dialysis: Secondary | ICD-10-CM | POA: Diagnosis not present

## 2018-11-06 DIAGNOSIS — D631 Anemia in chronic kidney disease: Secondary | ICD-10-CM | POA: Diagnosis not present

## 2018-11-06 DIAGNOSIS — N2581 Secondary hyperparathyroidism of renal origin: Secondary | ICD-10-CM | POA: Diagnosis not present

## 2018-11-07 DIAGNOSIS — D509 Iron deficiency anemia, unspecified: Secondary | ICD-10-CM | POA: Diagnosis not present

## 2018-11-07 DIAGNOSIS — Z992 Dependence on renal dialysis: Secondary | ICD-10-CM | POA: Diagnosis not present

## 2018-11-07 DIAGNOSIS — N186 End stage renal disease: Secondary | ICD-10-CM | POA: Diagnosis not present

## 2018-11-07 DIAGNOSIS — D631 Anemia in chronic kidney disease: Secondary | ICD-10-CM | POA: Diagnosis not present

## 2018-11-07 DIAGNOSIS — N2581 Secondary hyperparathyroidism of renal origin: Secondary | ICD-10-CM | POA: Diagnosis not present

## 2018-11-08 DIAGNOSIS — D509 Iron deficiency anemia, unspecified: Secondary | ICD-10-CM | POA: Diagnosis not present

## 2018-11-08 DIAGNOSIS — Z992 Dependence on renal dialysis: Secondary | ICD-10-CM | POA: Diagnosis not present

## 2018-11-08 DIAGNOSIS — D631 Anemia in chronic kidney disease: Secondary | ICD-10-CM | POA: Diagnosis not present

## 2018-11-08 DIAGNOSIS — N186 End stage renal disease: Secondary | ICD-10-CM | POA: Diagnosis not present

## 2018-11-08 DIAGNOSIS — N2581 Secondary hyperparathyroidism of renal origin: Secondary | ICD-10-CM | POA: Diagnosis not present

## 2018-11-09 DIAGNOSIS — D509 Iron deficiency anemia, unspecified: Secondary | ICD-10-CM | POA: Diagnosis not present

## 2018-11-09 DIAGNOSIS — D631 Anemia in chronic kidney disease: Secondary | ICD-10-CM | POA: Diagnosis not present

## 2018-11-09 DIAGNOSIS — Z992 Dependence on renal dialysis: Secondary | ICD-10-CM | POA: Diagnosis not present

## 2018-11-09 DIAGNOSIS — N186 End stage renal disease: Secondary | ICD-10-CM | POA: Diagnosis not present

## 2018-11-09 DIAGNOSIS — N2581 Secondary hyperparathyroidism of renal origin: Secondary | ICD-10-CM | POA: Diagnosis not present

## 2018-11-10 DIAGNOSIS — D631 Anemia in chronic kidney disease: Secondary | ICD-10-CM | POA: Diagnosis not present

## 2018-11-10 DIAGNOSIS — D509 Iron deficiency anemia, unspecified: Secondary | ICD-10-CM | POA: Diagnosis not present

## 2018-11-10 DIAGNOSIS — Z992 Dependence on renal dialysis: Secondary | ICD-10-CM | POA: Diagnosis not present

## 2018-11-10 DIAGNOSIS — N186 End stage renal disease: Secondary | ICD-10-CM | POA: Diagnosis not present

## 2018-11-10 DIAGNOSIS — N2581 Secondary hyperparathyroidism of renal origin: Secondary | ICD-10-CM | POA: Diagnosis not present

## 2018-11-11 DIAGNOSIS — Z7689 Persons encountering health services in other specified circumstances: Secondary | ICD-10-CM | POA: Diagnosis not present

## 2018-11-11 DIAGNOSIS — D509 Iron deficiency anemia, unspecified: Secondary | ICD-10-CM | POA: Diagnosis not present

## 2018-11-11 DIAGNOSIS — N186 End stage renal disease: Secondary | ICD-10-CM | POA: Diagnosis not present

## 2018-11-11 DIAGNOSIS — R600 Localized edema: Secondary | ICD-10-CM | POA: Diagnosis not present

## 2018-11-11 DIAGNOSIS — D631 Anemia in chronic kidney disease: Secondary | ICD-10-CM | POA: Diagnosis not present

## 2018-11-11 DIAGNOSIS — N2581 Secondary hyperparathyroidism of renal origin: Secondary | ICD-10-CM | POA: Diagnosis not present

## 2018-11-11 DIAGNOSIS — N185 Chronic kidney disease, stage 5: Secondary | ICD-10-CM | POA: Diagnosis not present

## 2018-11-11 DIAGNOSIS — N189 Chronic kidney disease, unspecified: Secondary | ICD-10-CM | POA: Diagnosis not present

## 2018-11-11 DIAGNOSIS — Z992 Dependence on renal dialysis: Secondary | ICD-10-CM | POA: Diagnosis not present

## 2018-11-12 DIAGNOSIS — N2581 Secondary hyperparathyroidism of renal origin: Secondary | ICD-10-CM | POA: Diagnosis not present

## 2018-11-12 DIAGNOSIS — Z992 Dependence on renal dialysis: Secondary | ICD-10-CM | POA: Diagnosis not present

## 2018-11-12 DIAGNOSIS — N186 End stage renal disease: Secondary | ICD-10-CM | POA: Diagnosis not present

## 2018-11-12 DIAGNOSIS — D509 Iron deficiency anemia, unspecified: Secondary | ICD-10-CM | POA: Diagnosis not present

## 2018-11-12 DIAGNOSIS — D631 Anemia in chronic kidney disease: Secondary | ICD-10-CM | POA: Diagnosis not present

## 2018-11-13 DIAGNOSIS — Z992 Dependence on renal dialysis: Secondary | ICD-10-CM | POA: Diagnosis not present

## 2018-11-13 DIAGNOSIS — D509 Iron deficiency anemia, unspecified: Secondary | ICD-10-CM | POA: Diagnosis not present

## 2018-11-13 DIAGNOSIS — N186 End stage renal disease: Secondary | ICD-10-CM | POA: Diagnosis not present

## 2018-11-13 DIAGNOSIS — D631 Anemia in chronic kidney disease: Secondary | ICD-10-CM | POA: Diagnosis not present

## 2018-11-13 DIAGNOSIS — N2581 Secondary hyperparathyroidism of renal origin: Secondary | ICD-10-CM | POA: Diagnosis not present

## 2018-11-14 DIAGNOSIS — D509 Iron deficiency anemia, unspecified: Secondary | ICD-10-CM | POA: Diagnosis not present

## 2018-11-14 DIAGNOSIS — Z992 Dependence on renal dialysis: Secondary | ICD-10-CM | POA: Diagnosis not present

## 2018-11-14 DIAGNOSIS — N2581 Secondary hyperparathyroidism of renal origin: Secondary | ICD-10-CM | POA: Diagnosis not present

## 2018-11-14 DIAGNOSIS — N186 End stage renal disease: Secondary | ICD-10-CM | POA: Diagnosis not present

## 2018-11-14 DIAGNOSIS — D631 Anemia in chronic kidney disease: Secondary | ICD-10-CM | POA: Diagnosis not present

## 2018-11-15 DIAGNOSIS — D509 Iron deficiency anemia, unspecified: Secondary | ICD-10-CM | POA: Diagnosis not present

## 2018-11-15 DIAGNOSIS — D631 Anemia in chronic kidney disease: Secondary | ICD-10-CM | POA: Diagnosis not present

## 2018-11-15 DIAGNOSIS — Z992 Dependence on renal dialysis: Secondary | ICD-10-CM | POA: Diagnosis not present

## 2018-11-15 DIAGNOSIS — N186 End stage renal disease: Secondary | ICD-10-CM | POA: Diagnosis not present

## 2018-11-15 DIAGNOSIS — N2581 Secondary hyperparathyroidism of renal origin: Secondary | ICD-10-CM | POA: Diagnosis not present

## 2018-11-16 DIAGNOSIS — D631 Anemia in chronic kidney disease: Secondary | ICD-10-CM | POA: Diagnosis not present

## 2018-11-16 DIAGNOSIS — D509 Iron deficiency anemia, unspecified: Secondary | ICD-10-CM | POA: Diagnosis not present

## 2018-11-16 DIAGNOSIS — N2581 Secondary hyperparathyroidism of renal origin: Secondary | ICD-10-CM | POA: Diagnosis not present

## 2018-11-16 DIAGNOSIS — Z992 Dependence on renal dialysis: Secondary | ICD-10-CM | POA: Diagnosis not present

## 2018-11-16 DIAGNOSIS — N186 End stage renal disease: Secondary | ICD-10-CM | POA: Diagnosis not present

## 2018-11-17 DIAGNOSIS — N2581 Secondary hyperparathyroidism of renal origin: Secondary | ICD-10-CM | POA: Diagnosis not present

## 2018-11-17 DIAGNOSIS — N186 End stage renal disease: Secondary | ICD-10-CM | POA: Diagnosis not present

## 2018-11-17 DIAGNOSIS — D509 Iron deficiency anemia, unspecified: Secondary | ICD-10-CM | POA: Diagnosis not present

## 2018-11-17 DIAGNOSIS — D631 Anemia in chronic kidney disease: Secondary | ICD-10-CM | POA: Diagnosis not present

## 2018-11-17 DIAGNOSIS — Z992 Dependence on renal dialysis: Secondary | ICD-10-CM | POA: Diagnosis not present

## 2018-11-18 DIAGNOSIS — D631 Anemia in chronic kidney disease: Secondary | ICD-10-CM | POA: Diagnosis not present

## 2018-11-18 DIAGNOSIS — D509 Iron deficiency anemia, unspecified: Secondary | ICD-10-CM | POA: Diagnosis not present

## 2018-11-18 DIAGNOSIS — Z992 Dependence on renal dialysis: Secondary | ICD-10-CM | POA: Diagnosis not present

## 2018-11-18 DIAGNOSIS — N2581 Secondary hyperparathyroidism of renal origin: Secondary | ICD-10-CM | POA: Diagnosis not present

## 2018-11-18 DIAGNOSIS — N186 End stage renal disease: Secondary | ICD-10-CM | POA: Diagnosis not present

## 2018-11-19 DIAGNOSIS — N2581 Secondary hyperparathyroidism of renal origin: Secondary | ICD-10-CM | POA: Diagnosis not present

## 2018-11-19 DIAGNOSIS — D509 Iron deficiency anemia, unspecified: Secondary | ICD-10-CM | POA: Diagnosis not present

## 2018-11-19 DIAGNOSIS — D631 Anemia in chronic kidney disease: Secondary | ICD-10-CM | POA: Diagnosis not present

## 2018-11-19 DIAGNOSIS — Z992 Dependence on renal dialysis: Secondary | ICD-10-CM | POA: Diagnosis not present

## 2018-11-19 DIAGNOSIS — N186 End stage renal disease: Secondary | ICD-10-CM | POA: Diagnosis not present

## 2018-11-20 DIAGNOSIS — D509 Iron deficiency anemia, unspecified: Secondary | ICD-10-CM | POA: Diagnosis not present

## 2018-11-20 DIAGNOSIS — Z992 Dependence on renal dialysis: Secondary | ICD-10-CM | POA: Diagnosis not present

## 2018-11-20 DIAGNOSIS — D631 Anemia in chronic kidney disease: Secondary | ICD-10-CM | POA: Diagnosis not present

## 2018-11-20 DIAGNOSIS — N2581 Secondary hyperparathyroidism of renal origin: Secondary | ICD-10-CM | POA: Diagnosis not present

## 2018-11-20 DIAGNOSIS — N186 End stage renal disease: Secondary | ICD-10-CM | POA: Diagnosis not present

## 2018-11-21 DIAGNOSIS — N186 End stage renal disease: Secondary | ICD-10-CM | POA: Diagnosis not present

## 2018-11-21 DIAGNOSIS — D509 Iron deficiency anemia, unspecified: Secondary | ICD-10-CM | POA: Diagnosis not present

## 2018-11-21 DIAGNOSIS — Z992 Dependence on renal dialysis: Secondary | ICD-10-CM | POA: Diagnosis not present

## 2018-11-21 DIAGNOSIS — D631 Anemia in chronic kidney disease: Secondary | ICD-10-CM | POA: Diagnosis not present

## 2018-11-21 DIAGNOSIS — N2581 Secondary hyperparathyroidism of renal origin: Secondary | ICD-10-CM | POA: Diagnosis not present

## 2018-11-22 DIAGNOSIS — D631 Anemia in chronic kidney disease: Secondary | ICD-10-CM | POA: Diagnosis not present

## 2018-11-22 DIAGNOSIS — Z992 Dependence on renal dialysis: Secondary | ICD-10-CM | POA: Diagnosis not present

## 2018-11-22 DIAGNOSIS — N186 End stage renal disease: Secondary | ICD-10-CM | POA: Diagnosis not present

## 2018-11-22 DIAGNOSIS — D509 Iron deficiency anemia, unspecified: Secondary | ICD-10-CM | POA: Diagnosis not present

## 2018-11-22 DIAGNOSIS — N2581 Secondary hyperparathyroidism of renal origin: Secondary | ICD-10-CM | POA: Diagnosis not present

## 2018-11-23 DIAGNOSIS — N2581 Secondary hyperparathyroidism of renal origin: Secondary | ICD-10-CM | POA: Diagnosis not present

## 2018-11-23 DIAGNOSIS — N186 End stage renal disease: Secondary | ICD-10-CM | POA: Diagnosis not present

## 2018-11-23 DIAGNOSIS — D509 Iron deficiency anemia, unspecified: Secondary | ICD-10-CM | POA: Diagnosis not present

## 2018-11-23 DIAGNOSIS — Z992 Dependence on renal dialysis: Secondary | ICD-10-CM | POA: Diagnosis not present

## 2018-11-23 DIAGNOSIS — D631 Anemia in chronic kidney disease: Secondary | ICD-10-CM | POA: Diagnosis not present

## 2018-11-24 DIAGNOSIS — N186 End stage renal disease: Secondary | ICD-10-CM | POA: Diagnosis not present

## 2018-11-24 DIAGNOSIS — Z992 Dependence on renal dialysis: Secondary | ICD-10-CM | POA: Diagnosis not present

## 2018-11-24 DIAGNOSIS — D631 Anemia in chronic kidney disease: Secondary | ICD-10-CM | POA: Diagnosis not present

## 2018-11-24 DIAGNOSIS — N2581 Secondary hyperparathyroidism of renal origin: Secondary | ICD-10-CM | POA: Diagnosis not present

## 2018-11-24 DIAGNOSIS — D509 Iron deficiency anemia, unspecified: Secondary | ICD-10-CM | POA: Diagnosis not present

## 2018-11-25 DIAGNOSIS — D631 Anemia in chronic kidney disease: Secondary | ICD-10-CM | POA: Diagnosis not present

## 2018-11-25 DIAGNOSIS — D509 Iron deficiency anemia, unspecified: Secondary | ICD-10-CM | POA: Diagnosis not present

## 2018-11-25 DIAGNOSIS — N2581 Secondary hyperparathyroidism of renal origin: Secondary | ICD-10-CM | POA: Diagnosis not present

## 2018-11-25 DIAGNOSIS — Z992 Dependence on renal dialysis: Secondary | ICD-10-CM | POA: Diagnosis not present

## 2018-11-25 DIAGNOSIS — N186 End stage renal disease: Secondary | ICD-10-CM | POA: Diagnosis not present

## 2018-11-26 DIAGNOSIS — D631 Anemia in chronic kidney disease: Secondary | ICD-10-CM | POA: Diagnosis not present

## 2018-11-26 DIAGNOSIS — Z992 Dependence on renal dialysis: Secondary | ICD-10-CM | POA: Diagnosis not present

## 2018-11-26 DIAGNOSIS — D509 Iron deficiency anemia, unspecified: Secondary | ICD-10-CM | POA: Diagnosis not present

## 2018-11-26 DIAGNOSIS — N2581 Secondary hyperparathyroidism of renal origin: Secondary | ICD-10-CM | POA: Diagnosis not present

## 2018-11-26 DIAGNOSIS — N186 End stage renal disease: Secondary | ICD-10-CM | POA: Diagnosis not present

## 2018-11-27 DIAGNOSIS — N2581 Secondary hyperparathyroidism of renal origin: Secondary | ICD-10-CM | POA: Diagnosis not present

## 2018-11-27 DIAGNOSIS — D631 Anemia in chronic kidney disease: Secondary | ICD-10-CM | POA: Diagnosis not present

## 2018-11-27 DIAGNOSIS — Z992 Dependence on renal dialysis: Secondary | ICD-10-CM | POA: Diagnosis not present

## 2018-11-27 DIAGNOSIS — N186 End stage renal disease: Secondary | ICD-10-CM | POA: Diagnosis not present

## 2018-11-27 DIAGNOSIS — D509 Iron deficiency anemia, unspecified: Secondary | ICD-10-CM | POA: Diagnosis not present

## 2018-11-28 DIAGNOSIS — D631 Anemia in chronic kidney disease: Secondary | ICD-10-CM | POA: Diagnosis not present

## 2018-11-28 DIAGNOSIS — N186 End stage renal disease: Secondary | ICD-10-CM | POA: Diagnosis not present

## 2018-11-28 DIAGNOSIS — Z992 Dependence on renal dialysis: Secondary | ICD-10-CM | POA: Diagnosis not present

## 2018-11-28 DIAGNOSIS — N2581 Secondary hyperparathyroidism of renal origin: Secondary | ICD-10-CM | POA: Diagnosis not present

## 2018-11-28 DIAGNOSIS — D509 Iron deficiency anemia, unspecified: Secondary | ICD-10-CM | POA: Diagnosis not present

## 2018-11-29 DIAGNOSIS — D631 Anemia in chronic kidney disease: Secondary | ICD-10-CM | POA: Diagnosis not present

## 2018-11-29 DIAGNOSIS — N2581 Secondary hyperparathyroidism of renal origin: Secondary | ICD-10-CM | POA: Diagnosis not present

## 2018-11-29 DIAGNOSIS — Z992 Dependence on renal dialysis: Secondary | ICD-10-CM | POA: Diagnosis not present

## 2018-11-29 DIAGNOSIS — N186 End stage renal disease: Secondary | ICD-10-CM | POA: Diagnosis not present

## 2018-11-30 DIAGNOSIS — D631 Anemia in chronic kidney disease: Secondary | ICD-10-CM | POA: Diagnosis not present

## 2018-11-30 DIAGNOSIS — N186 End stage renal disease: Secondary | ICD-10-CM | POA: Diagnosis not present

## 2018-11-30 DIAGNOSIS — Z992 Dependence on renal dialysis: Secondary | ICD-10-CM | POA: Diagnosis not present

## 2018-11-30 DIAGNOSIS — N2581 Secondary hyperparathyroidism of renal origin: Secondary | ICD-10-CM | POA: Diagnosis not present

## 2018-12-01 DIAGNOSIS — N2581 Secondary hyperparathyroidism of renal origin: Secondary | ICD-10-CM | POA: Diagnosis not present

## 2018-12-01 DIAGNOSIS — N186 End stage renal disease: Secondary | ICD-10-CM | POA: Diagnosis not present

## 2018-12-01 DIAGNOSIS — Z992 Dependence on renal dialysis: Secondary | ICD-10-CM | POA: Diagnosis not present

## 2018-12-01 DIAGNOSIS — D631 Anemia in chronic kidney disease: Secondary | ICD-10-CM | POA: Diagnosis not present

## 2018-12-02 DIAGNOSIS — N186 End stage renal disease: Secondary | ICD-10-CM | POA: Diagnosis not present

## 2018-12-02 DIAGNOSIS — N2581 Secondary hyperparathyroidism of renal origin: Secondary | ICD-10-CM | POA: Diagnosis not present

## 2018-12-02 DIAGNOSIS — N185 Chronic kidney disease, stage 5: Secondary | ICD-10-CM | POA: Diagnosis not present

## 2018-12-02 DIAGNOSIS — D631 Anemia in chronic kidney disease: Secondary | ICD-10-CM | POA: Diagnosis not present

## 2018-12-02 DIAGNOSIS — R05 Cough: Secondary | ICD-10-CM | POA: Diagnosis not present

## 2018-12-02 DIAGNOSIS — Z992 Dependence on renal dialysis: Secondary | ICD-10-CM | POA: Diagnosis not present

## 2018-12-02 DIAGNOSIS — Z20828 Contact with and (suspected) exposure to other viral communicable diseases: Secondary | ICD-10-CM | POA: Diagnosis not present

## 2018-12-02 DIAGNOSIS — Z209 Contact with and (suspected) exposure to unspecified communicable disease: Secondary | ICD-10-CM | POA: Diagnosis not present

## 2018-12-02 DIAGNOSIS — Z1159 Encounter for screening for other viral diseases: Secondary | ICD-10-CM | POA: Diagnosis not present

## 2018-12-03 DIAGNOSIS — Z992 Dependence on renal dialysis: Secondary | ICD-10-CM | POA: Diagnosis not present

## 2018-12-03 DIAGNOSIS — D631 Anemia in chronic kidney disease: Secondary | ICD-10-CM | POA: Diagnosis not present

## 2018-12-03 DIAGNOSIS — N186 End stage renal disease: Secondary | ICD-10-CM | POA: Diagnosis not present

## 2018-12-03 DIAGNOSIS — N2581 Secondary hyperparathyroidism of renal origin: Secondary | ICD-10-CM | POA: Diagnosis not present

## 2018-12-04 DIAGNOSIS — N2581 Secondary hyperparathyroidism of renal origin: Secondary | ICD-10-CM | POA: Diagnosis not present

## 2018-12-04 DIAGNOSIS — Z992 Dependence on renal dialysis: Secondary | ICD-10-CM | POA: Diagnosis not present

## 2018-12-04 DIAGNOSIS — N186 End stage renal disease: Secondary | ICD-10-CM | POA: Diagnosis not present

## 2018-12-04 DIAGNOSIS — D631 Anemia in chronic kidney disease: Secondary | ICD-10-CM | POA: Diagnosis not present

## 2018-12-05 DIAGNOSIS — N186 End stage renal disease: Secondary | ICD-10-CM | POA: Diagnosis not present

## 2018-12-05 DIAGNOSIS — D631 Anemia in chronic kidney disease: Secondary | ICD-10-CM | POA: Diagnosis not present

## 2018-12-05 DIAGNOSIS — Z992 Dependence on renal dialysis: Secondary | ICD-10-CM | POA: Diagnosis not present

## 2018-12-05 DIAGNOSIS — N2581 Secondary hyperparathyroidism of renal origin: Secondary | ICD-10-CM | POA: Diagnosis not present

## 2018-12-06 ENCOUNTER — Encounter: Payer: Self-pay | Admitting: Family

## 2018-12-06 DIAGNOSIS — M109 Gout, unspecified: Secondary | ICD-10-CM | POA: Diagnosis present

## 2018-12-06 DIAGNOSIS — E8770 Fluid overload, unspecified: Secondary | ICD-10-CM | POA: Diagnosis not present

## 2018-12-06 DIAGNOSIS — I120 Hypertensive chronic kidney disease with stage 5 chronic kidney disease or end stage renal disease: Secondary | ICD-10-CM | POA: Diagnosis not present

## 2018-12-06 DIAGNOSIS — R188 Other ascites: Secondary | ICD-10-CM | POA: Diagnosis not present

## 2018-12-06 DIAGNOSIS — Z992 Dependence on renal dialysis: Secondary | ICD-10-CM | POA: Diagnosis not present

## 2018-12-06 DIAGNOSIS — M1A9XX0 Chronic gout, unspecified, without tophus (tophi): Secondary | ICD-10-CM | POA: Insufficient documentation

## 2018-12-06 DIAGNOSIS — R109 Unspecified abdominal pain: Secondary | ICD-10-CM | POA: Diagnosis not present

## 2018-12-06 DIAGNOSIS — D631 Anemia in chronic kidney disease: Secondary | ICD-10-CM | POA: Diagnosis present

## 2018-12-06 DIAGNOSIS — R0602 Shortness of breath: Secondary | ICD-10-CM | POA: Diagnosis not present

## 2018-12-06 DIAGNOSIS — J810 Acute pulmonary edema: Secondary | ICD-10-CM | POA: Diagnosis not present

## 2018-12-06 DIAGNOSIS — K219 Gastro-esophageal reflux disease without esophagitis: Secondary | ICD-10-CM | POA: Diagnosis not present

## 2018-12-06 DIAGNOSIS — R0902 Hypoxemia: Secondary | ICD-10-CM | POA: Diagnosis not present

## 2018-12-06 DIAGNOSIS — R062 Wheezing: Secondary | ICD-10-CM | POA: Diagnosis not present

## 2018-12-06 DIAGNOSIS — Z20828 Contact with and (suspected) exposure to other viral communicable diseases: Secondary | ICD-10-CM | POA: Diagnosis present

## 2018-12-06 DIAGNOSIS — G2581 Restless legs syndrome: Secondary | ICD-10-CM | POA: Diagnosis not present

## 2018-12-06 DIAGNOSIS — Z9049 Acquired absence of other specified parts of digestive tract: Secondary | ICD-10-CM | POA: Diagnosis not present

## 2018-12-06 DIAGNOSIS — E875 Hyperkalemia: Secondary | ICD-10-CM | POA: Diagnosis present

## 2018-12-06 DIAGNOSIS — Z794 Long term (current) use of insulin: Secondary | ICD-10-CM | POA: Diagnosis not present

## 2018-12-06 DIAGNOSIS — D649 Anemia, unspecified: Secondary | ICD-10-CM | POA: Diagnosis not present

## 2018-12-06 DIAGNOSIS — I129 Hypertensive chronic kidney disease with stage 1 through stage 4 chronic kidney disease, or unspecified chronic kidney disease: Secondary | ICD-10-CM | POA: Diagnosis present

## 2018-12-06 DIAGNOSIS — N261 Atrophy of kidney (terminal): Secondary | ICD-10-CM | POA: Diagnosis not present

## 2018-12-06 DIAGNOSIS — J9601 Acute respiratory failure with hypoxia: Secondary | ICD-10-CM | POA: Diagnosis not present

## 2018-12-06 DIAGNOSIS — I160 Hypertensive urgency: Secondary | ICD-10-CM | POA: Diagnosis not present

## 2018-12-06 DIAGNOSIS — J189 Pneumonia, unspecified organism: Secondary | ICD-10-CM | POA: Diagnosis not present

## 2018-12-06 DIAGNOSIS — R590 Localized enlarged lymph nodes: Secondary | ICD-10-CM | POA: Diagnosis present

## 2018-12-06 DIAGNOSIS — R042 Hemoptysis: Secondary | ICD-10-CM | POA: Diagnosis not present

## 2018-12-06 DIAGNOSIS — N186 End stage renal disease: Secondary | ICD-10-CM | POA: Diagnosis not present

## 2018-12-06 DIAGNOSIS — Z03818 Encounter for observation for suspected exposure to other biological agents ruled out: Secondary | ICD-10-CM | POA: Diagnosis not present

## 2018-12-10 ENCOUNTER — Encounter (HOSPITAL_COMMUNITY): Payer: Self-pay

## 2018-12-13 DIAGNOSIS — N2581 Secondary hyperparathyroidism of renal origin: Secondary | ICD-10-CM | POA: Diagnosis not present

## 2018-12-13 DIAGNOSIS — N186 End stage renal disease: Secondary | ICD-10-CM | POA: Diagnosis not present

## 2018-12-13 DIAGNOSIS — D631 Anemia in chronic kidney disease: Secondary | ICD-10-CM | POA: Diagnosis not present

## 2018-12-13 DIAGNOSIS — Z992 Dependence on renal dialysis: Secondary | ICD-10-CM | POA: Diagnosis not present

## 2018-12-14 DIAGNOSIS — N186 End stage renal disease: Secondary | ICD-10-CM | POA: Diagnosis not present

## 2018-12-14 DIAGNOSIS — N2581 Secondary hyperparathyroidism of renal origin: Secondary | ICD-10-CM | POA: Diagnosis not present

## 2018-12-14 DIAGNOSIS — Z992 Dependence on renal dialysis: Secondary | ICD-10-CM | POA: Diagnosis not present

## 2018-12-14 DIAGNOSIS — D631 Anemia in chronic kidney disease: Secondary | ICD-10-CM | POA: Diagnosis not present

## 2018-12-15 ENCOUNTER — Encounter: Payer: Self-pay | Admitting: Family

## 2018-12-15 DIAGNOSIS — N186 End stage renal disease: Secondary | ICD-10-CM | POA: Diagnosis not present

## 2018-12-15 DIAGNOSIS — Z992 Dependence on renal dialysis: Secondary | ICD-10-CM | POA: Diagnosis not present

## 2018-12-15 DIAGNOSIS — N2581 Secondary hyperparathyroidism of renal origin: Secondary | ICD-10-CM | POA: Diagnosis not present

## 2018-12-15 DIAGNOSIS — D631 Anemia in chronic kidney disease: Secondary | ICD-10-CM | POA: Diagnosis not present

## 2018-12-16 DIAGNOSIS — R928 Other abnormal and inconclusive findings on diagnostic imaging of breast: Secondary | ICD-10-CM | POA: Diagnosis not present

## 2018-12-16 DIAGNOSIS — G2581 Restless legs syndrome: Secondary | ICD-10-CM | POA: Diagnosis not present

## 2018-12-16 DIAGNOSIS — N186 End stage renal disease: Secondary | ICD-10-CM | POA: Diagnosis not present

## 2018-12-16 DIAGNOSIS — N2581 Secondary hyperparathyroidism of renal origin: Secondary | ICD-10-CM | POA: Diagnosis not present

## 2018-12-16 DIAGNOSIS — D631 Anemia in chronic kidney disease: Secondary | ICD-10-CM | POA: Diagnosis not present

## 2018-12-16 DIAGNOSIS — Z7689 Persons encountering health services in other specified circumstances: Secondary | ICD-10-CM | POA: Diagnosis not present

## 2018-12-16 DIAGNOSIS — Z1211 Encounter for screening for malignant neoplasm of colon: Secondary | ICD-10-CM | POA: Diagnosis not present

## 2018-12-16 DIAGNOSIS — Z992 Dependence on renal dialysis: Secondary | ICD-10-CM | POA: Diagnosis not present

## 2018-12-17 DIAGNOSIS — N186 End stage renal disease: Secondary | ICD-10-CM | POA: Diagnosis not present

## 2018-12-17 DIAGNOSIS — Z992 Dependence on renal dialysis: Secondary | ICD-10-CM | POA: Diagnosis not present

## 2018-12-17 DIAGNOSIS — N2581 Secondary hyperparathyroidism of renal origin: Secondary | ICD-10-CM | POA: Diagnosis not present

## 2018-12-17 DIAGNOSIS — D631 Anemia in chronic kidney disease: Secondary | ICD-10-CM | POA: Diagnosis not present

## 2018-12-18 DIAGNOSIS — N186 End stage renal disease: Secondary | ICD-10-CM | POA: Diagnosis not present

## 2018-12-18 DIAGNOSIS — D631 Anemia in chronic kidney disease: Secondary | ICD-10-CM | POA: Diagnosis not present

## 2018-12-18 DIAGNOSIS — N2581 Secondary hyperparathyroidism of renal origin: Secondary | ICD-10-CM | POA: Diagnosis not present

## 2018-12-18 DIAGNOSIS — Z992 Dependence on renal dialysis: Secondary | ICD-10-CM | POA: Diagnosis not present

## 2018-12-19 DIAGNOSIS — D631 Anemia in chronic kidney disease: Secondary | ICD-10-CM | POA: Diagnosis not present

## 2018-12-19 DIAGNOSIS — Z992 Dependence on renal dialysis: Secondary | ICD-10-CM | POA: Diagnosis not present

## 2018-12-19 DIAGNOSIS — N186 End stage renal disease: Secondary | ICD-10-CM | POA: Diagnosis not present

## 2018-12-19 DIAGNOSIS — N2581 Secondary hyperparathyroidism of renal origin: Secondary | ICD-10-CM | POA: Diagnosis not present

## 2018-12-20 DIAGNOSIS — Z992 Dependence on renal dialysis: Secondary | ICD-10-CM | POA: Diagnosis not present

## 2018-12-20 DIAGNOSIS — N186 End stage renal disease: Secondary | ICD-10-CM | POA: Diagnosis not present

## 2018-12-20 DIAGNOSIS — D631 Anemia in chronic kidney disease: Secondary | ICD-10-CM | POA: Diagnosis not present

## 2018-12-20 DIAGNOSIS — N2581 Secondary hyperparathyroidism of renal origin: Secondary | ICD-10-CM | POA: Diagnosis not present

## 2018-12-21 DIAGNOSIS — D631 Anemia in chronic kidney disease: Secondary | ICD-10-CM | POA: Diagnosis not present

## 2018-12-21 DIAGNOSIS — N2581 Secondary hyperparathyroidism of renal origin: Secondary | ICD-10-CM | POA: Diagnosis not present

## 2018-12-21 DIAGNOSIS — Z992 Dependence on renal dialysis: Secondary | ICD-10-CM | POA: Diagnosis not present

## 2018-12-21 DIAGNOSIS — N186 End stage renal disease: Secondary | ICD-10-CM | POA: Diagnosis not present

## 2018-12-22 DIAGNOSIS — N186 End stage renal disease: Secondary | ICD-10-CM | POA: Diagnosis not present

## 2018-12-22 DIAGNOSIS — Z992 Dependence on renal dialysis: Secondary | ICD-10-CM | POA: Diagnosis not present

## 2018-12-22 DIAGNOSIS — D631 Anemia in chronic kidney disease: Secondary | ICD-10-CM | POA: Diagnosis not present

## 2018-12-22 DIAGNOSIS — N2581 Secondary hyperparathyroidism of renal origin: Secondary | ICD-10-CM | POA: Diagnosis not present

## 2018-12-23 DIAGNOSIS — Z992 Dependence on renal dialysis: Secondary | ICD-10-CM | POA: Diagnosis not present

## 2018-12-23 DIAGNOSIS — D631 Anemia in chronic kidney disease: Secondary | ICD-10-CM | POA: Diagnosis not present

## 2018-12-23 DIAGNOSIS — N2581 Secondary hyperparathyroidism of renal origin: Secondary | ICD-10-CM | POA: Diagnosis not present

## 2018-12-23 DIAGNOSIS — N186 End stage renal disease: Secondary | ICD-10-CM | POA: Diagnosis not present

## 2018-12-24 DIAGNOSIS — Z992 Dependence on renal dialysis: Secondary | ICD-10-CM | POA: Diagnosis not present

## 2018-12-24 DIAGNOSIS — N2581 Secondary hyperparathyroidism of renal origin: Secondary | ICD-10-CM | POA: Diagnosis not present

## 2018-12-24 DIAGNOSIS — D631 Anemia in chronic kidney disease: Secondary | ICD-10-CM | POA: Diagnosis not present

## 2018-12-24 DIAGNOSIS — N186 End stage renal disease: Secondary | ICD-10-CM | POA: Diagnosis not present

## 2018-12-25 DIAGNOSIS — D631 Anemia in chronic kidney disease: Secondary | ICD-10-CM | POA: Diagnosis not present

## 2018-12-25 DIAGNOSIS — N186 End stage renal disease: Secondary | ICD-10-CM | POA: Diagnosis not present

## 2018-12-25 DIAGNOSIS — N2581 Secondary hyperparathyroidism of renal origin: Secondary | ICD-10-CM | POA: Diagnosis not present

## 2018-12-25 DIAGNOSIS — Z992 Dependence on renal dialysis: Secondary | ICD-10-CM | POA: Diagnosis not present

## 2018-12-26 DIAGNOSIS — N186 End stage renal disease: Secondary | ICD-10-CM | POA: Diagnosis not present

## 2018-12-26 DIAGNOSIS — N2581 Secondary hyperparathyroidism of renal origin: Secondary | ICD-10-CM | POA: Diagnosis not present

## 2018-12-26 DIAGNOSIS — D631 Anemia in chronic kidney disease: Secondary | ICD-10-CM | POA: Diagnosis not present

## 2018-12-26 DIAGNOSIS — Z992 Dependence on renal dialysis: Secondary | ICD-10-CM | POA: Diagnosis not present

## 2018-12-27 DIAGNOSIS — N186 End stage renal disease: Secondary | ICD-10-CM | POA: Diagnosis not present

## 2018-12-27 DIAGNOSIS — D631 Anemia in chronic kidney disease: Secondary | ICD-10-CM | POA: Diagnosis not present

## 2018-12-27 DIAGNOSIS — N2581 Secondary hyperparathyroidism of renal origin: Secondary | ICD-10-CM | POA: Diagnosis not present

## 2018-12-27 DIAGNOSIS — Z992 Dependence on renal dialysis: Secondary | ICD-10-CM | POA: Diagnosis not present

## 2018-12-28 DIAGNOSIS — Z992 Dependence on renal dialysis: Secondary | ICD-10-CM | POA: Diagnosis not present

## 2018-12-28 DIAGNOSIS — N2581 Secondary hyperparathyroidism of renal origin: Secondary | ICD-10-CM | POA: Diagnosis not present

## 2018-12-28 DIAGNOSIS — D631 Anemia in chronic kidney disease: Secondary | ICD-10-CM | POA: Diagnosis not present

## 2018-12-28 DIAGNOSIS — N186 End stage renal disease: Secondary | ICD-10-CM | POA: Diagnosis not present

## 2018-12-29 DIAGNOSIS — N186 End stage renal disease: Secondary | ICD-10-CM | POA: Diagnosis not present

## 2018-12-29 DIAGNOSIS — D631 Anemia in chronic kidney disease: Secondary | ICD-10-CM | POA: Diagnosis not present

## 2018-12-29 DIAGNOSIS — Z992 Dependence on renal dialysis: Secondary | ICD-10-CM | POA: Diagnosis not present

## 2018-12-29 DIAGNOSIS — N2581 Secondary hyperparathyroidism of renal origin: Secondary | ICD-10-CM | POA: Diagnosis not present

## 2018-12-30 DIAGNOSIS — D631 Anemia in chronic kidney disease: Secondary | ICD-10-CM | POA: Diagnosis not present

## 2018-12-30 DIAGNOSIS — N2581 Secondary hyperparathyroidism of renal origin: Secondary | ICD-10-CM | POA: Diagnosis not present

## 2018-12-30 DIAGNOSIS — N186 End stage renal disease: Secondary | ICD-10-CM | POA: Diagnosis not present

## 2018-12-30 DIAGNOSIS — Z992 Dependence on renal dialysis: Secondary | ICD-10-CM | POA: Diagnosis not present

## 2018-12-31 DIAGNOSIS — D631 Anemia in chronic kidney disease: Secondary | ICD-10-CM | POA: Diagnosis not present

## 2018-12-31 DIAGNOSIS — Z992 Dependence on renal dialysis: Secondary | ICD-10-CM | POA: Diagnosis not present

## 2018-12-31 DIAGNOSIS — N186 End stage renal disease: Secondary | ICD-10-CM | POA: Diagnosis not present

## 2018-12-31 DIAGNOSIS — N2581 Secondary hyperparathyroidism of renal origin: Secondary | ICD-10-CM | POA: Diagnosis not present

## 2019-01-01 DIAGNOSIS — N2581 Secondary hyperparathyroidism of renal origin: Secondary | ICD-10-CM | POA: Diagnosis not present

## 2019-01-01 DIAGNOSIS — Z992 Dependence on renal dialysis: Secondary | ICD-10-CM | POA: Diagnosis not present

## 2019-01-01 DIAGNOSIS — N186 End stage renal disease: Secondary | ICD-10-CM | POA: Diagnosis not present

## 2019-01-01 DIAGNOSIS — D631 Anemia in chronic kidney disease: Secondary | ICD-10-CM | POA: Diagnosis not present

## 2019-01-02 DIAGNOSIS — N2581 Secondary hyperparathyroidism of renal origin: Secondary | ICD-10-CM | POA: Diagnosis not present

## 2019-01-02 DIAGNOSIS — D631 Anemia in chronic kidney disease: Secondary | ICD-10-CM | POA: Diagnosis not present

## 2019-01-02 DIAGNOSIS — Z992 Dependence on renal dialysis: Secondary | ICD-10-CM | POA: Diagnosis not present

## 2019-01-02 DIAGNOSIS — N186 End stage renal disease: Secondary | ICD-10-CM | POA: Diagnosis not present

## 2019-01-03 DIAGNOSIS — N186 End stage renal disease: Secondary | ICD-10-CM | POA: Diagnosis not present

## 2019-01-03 DIAGNOSIS — N2581 Secondary hyperparathyroidism of renal origin: Secondary | ICD-10-CM | POA: Diagnosis not present

## 2019-01-03 DIAGNOSIS — D631 Anemia in chronic kidney disease: Secondary | ICD-10-CM | POA: Diagnosis not present

## 2019-01-03 DIAGNOSIS — Z992 Dependence on renal dialysis: Secondary | ICD-10-CM | POA: Diagnosis not present

## 2019-01-04 DIAGNOSIS — N186 End stage renal disease: Secondary | ICD-10-CM | POA: Diagnosis not present

## 2019-01-04 DIAGNOSIS — N2581 Secondary hyperparathyroidism of renal origin: Secondary | ICD-10-CM | POA: Diagnosis not present

## 2019-01-04 DIAGNOSIS — Z992 Dependence on renal dialysis: Secondary | ICD-10-CM | POA: Diagnosis not present

## 2019-01-04 DIAGNOSIS — D631 Anemia in chronic kidney disease: Secondary | ICD-10-CM | POA: Diagnosis not present

## 2019-01-05 DIAGNOSIS — N186 End stage renal disease: Secondary | ICD-10-CM | POA: Diagnosis not present

## 2019-01-05 DIAGNOSIS — D631 Anemia in chronic kidney disease: Secondary | ICD-10-CM | POA: Diagnosis not present

## 2019-01-05 DIAGNOSIS — N2581 Secondary hyperparathyroidism of renal origin: Secondary | ICD-10-CM | POA: Diagnosis not present

## 2019-01-05 DIAGNOSIS — Z992 Dependence on renal dialysis: Secondary | ICD-10-CM | POA: Diagnosis not present

## 2019-01-06 DIAGNOSIS — Z992 Dependence on renal dialysis: Secondary | ICD-10-CM | POA: Diagnosis not present

## 2019-01-06 DIAGNOSIS — N6311 Unspecified lump in the right breast, upper outer quadrant: Secondary | ICD-10-CM | POA: Diagnosis not present

## 2019-01-06 DIAGNOSIS — N186 End stage renal disease: Secondary | ICD-10-CM | POA: Diagnosis not present

## 2019-01-06 DIAGNOSIS — D631 Anemia in chronic kidney disease: Secondary | ICD-10-CM | POA: Diagnosis not present

## 2019-01-06 DIAGNOSIS — R928 Other abnormal and inconclusive findings on diagnostic imaging of breast: Secondary | ICD-10-CM | POA: Diagnosis not present

## 2019-01-06 DIAGNOSIS — R922 Inconclusive mammogram: Secondary | ICD-10-CM | POA: Diagnosis not present

## 2019-01-06 DIAGNOSIS — N6489 Other specified disorders of breast: Secondary | ICD-10-CM | POA: Diagnosis not present

## 2019-01-06 DIAGNOSIS — N2581 Secondary hyperparathyroidism of renal origin: Secondary | ICD-10-CM | POA: Diagnosis not present

## 2019-01-07 DIAGNOSIS — N186 End stage renal disease: Secondary | ICD-10-CM | POA: Diagnosis not present

## 2019-01-07 DIAGNOSIS — Z992 Dependence on renal dialysis: Secondary | ICD-10-CM | POA: Diagnosis not present

## 2019-01-07 DIAGNOSIS — N2581 Secondary hyperparathyroidism of renal origin: Secondary | ICD-10-CM | POA: Diagnosis not present

## 2019-01-07 DIAGNOSIS — D631 Anemia in chronic kidney disease: Secondary | ICD-10-CM | POA: Diagnosis not present

## 2019-01-08 DIAGNOSIS — N186 End stage renal disease: Secondary | ICD-10-CM | POA: Diagnosis not present

## 2019-01-08 DIAGNOSIS — D631 Anemia in chronic kidney disease: Secondary | ICD-10-CM | POA: Diagnosis not present

## 2019-01-08 DIAGNOSIS — N2581 Secondary hyperparathyroidism of renal origin: Secondary | ICD-10-CM | POA: Diagnosis not present

## 2019-01-08 DIAGNOSIS — Z992 Dependence on renal dialysis: Secondary | ICD-10-CM | POA: Diagnosis not present

## 2019-01-09 DIAGNOSIS — N2581 Secondary hyperparathyroidism of renal origin: Secondary | ICD-10-CM | POA: Diagnosis not present

## 2019-01-09 DIAGNOSIS — Z992 Dependence on renal dialysis: Secondary | ICD-10-CM | POA: Diagnosis not present

## 2019-01-09 DIAGNOSIS — N186 End stage renal disease: Secondary | ICD-10-CM | POA: Diagnosis not present

## 2019-01-09 DIAGNOSIS — D631 Anemia in chronic kidney disease: Secondary | ICD-10-CM | POA: Diagnosis not present

## 2019-01-10 DIAGNOSIS — N2581 Secondary hyperparathyroidism of renal origin: Secondary | ICD-10-CM | POA: Diagnosis not present

## 2019-01-10 DIAGNOSIS — D631 Anemia in chronic kidney disease: Secondary | ICD-10-CM | POA: Diagnosis not present

## 2019-01-10 DIAGNOSIS — N186 End stage renal disease: Secondary | ICD-10-CM | POA: Diagnosis not present

## 2019-01-10 DIAGNOSIS — Z992 Dependence on renal dialysis: Secondary | ICD-10-CM | POA: Diagnosis not present

## 2019-01-11 DIAGNOSIS — N2581 Secondary hyperparathyroidism of renal origin: Secondary | ICD-10-CM | POA: Diagnosis not present

## 2019-01-11 DIAGNOSIS — N186 End stage renal disease: Secondary | ICD-10-CM | POA: Diagnosis not present

## 2019-01-11 DIAGNOSIS — D631 Anemia in chronic kidney disease: Secondary | ICD-10-CM | POA: Diagnosis not present

## 2019-01-11 DIAGNOSIS — Z992 Dependence on renal dialysis: Secondary | ICD-10-CM | POA: Diagnosis not present

## 2019-01-12 DIAGNOSIS — Z992 Dependence on renal dialysis: Secondary | ICD-10-CM | POA: Diagnosis not present

## 2019-01-12 DIAGNOSIS — N186 End stage renal disease: Secondary | ICD-10-CM | POA: Diagnosis not present

## 2019-01-12 DIAGNOSIS — N2581 Secondary hyperparathyroidism of renal origin: Secondary | ICD-10-CM | POA: Diagnosis not present

## 2019-01-12 DIAGNOSIS — D631 Anemia in chronic kidney disease: Secondary | ICD-10-CM | POA: Diagnosis not present

## 2019-01-13 DIAGNOSIS — D631 Anemia in chronic kidney disease: Secondary | ICD-10-CM | POA: Diagnosis not present

## 2019-01-13 DIAGNOSIS — N186 End stage renal disease: Secondary | ICD-10-CM | POA: Diagnosis not present

## 2019-01-13 DIAGNOSIS — Z992 Dependence on renal dialysis: Secondary | ICD-10-CM | POA: Diagnosis not present

## 2019-01-13 DIAGNOSIS — N2581 Secondary hyperparathyroidism of renal origin: Secondary | ICD-10-CM | POA: Diagnosis not present

## 2019-01-14 DIAGNOSIS — G2581 Restless legs syndrome: Secondary | ICD-10-CM | POA: Diagnosis not present

## 2019-01-14 DIAGNOSIS — Z1331 Encounter for screening for depression: Secondary | ICD-10-CM | POA: Diagnosis not present

## 2019-01-14 DIAGNOSIS — Z139 Encounter for screening, unspecified: Secondary | ICD-10-CM | POA: Diagnosis not present

## 2019-01-14 DIAGNOSIS — J189 Pneumonia, unspecified organism: Secondary | ICD-10-CM | POA: Diagnosis not present

## 2019-01-14 DIAGNOSIS — Z992 Dependence on renal dialysis: Secondary | ICD-10-CM | POA: Diagnosis not present

## 2019-01-14 DIAGNOSIS — N186 End stage renal disease: Secondary | ICD-10-CM | POA: Diagnosis not present

## 2019-01-14 DIAGNOSIS — D631 Anemia in chronic kidney disease: Secondary | ICD-10-CM | POA: Diagnosis not present

## 2019-01-14 DIAGNOSIS — Z0000 Encounter for general adult medical examination without abnormal findings: Secondary | ICD-10-CM | POA: Diagnosis not present

## 2019-01-14 DIAGNOSIS — N2581 Secondary hyperparathyroidism of renal origin: Secondary | ICD-10-CM | POA: Diagnosis not present

## 2019-01-15 DIAGNOSIS — N2581 Secondary hyperparathyroidism of renal origin: Secondary | ICD-10-CM | POA: Diagnosis not present

## 2019-01-15 DIAGNOSIS — N186 End stage renal disease: Secondary | ICD-10-CM | POA: Diagnosis not present

## 2019-01-15 DIAGNOSIS — D631 Anemia in chronic kidney disease: Secondary | ICD-10-CM | POA: Diagnosis not present

## 2019-01-15 DIAGNOSIS — Z992 Dependence on renal dialysis: Secondary | ICD-10-CM | POA: Diagnosis not present

## 2019-01-16 DIAGNOSIS — D631 Anemia in chronic kidney disease: Secondary | ICD-10-CM | POA: Diagnosis not present

## 2019-01-16 DIAGNOSIS — Z992 Dependence on renal dialysis: Secondary | ICD-10-CM | POA: Diagnosis not present

## 2019-01-16 DIAGNOSIS — N186 End stage renal disease: Secondary | ICD-10-CM | POA: Diagnosis not present

## 2019-01-16 DIAGNOSIS — N2581 Secondary hyperparathyroidism of renal origin: Secondary | ICD-10-CM | POA: Diagnosis not present

## 2019-01-17 DIAGNOSIS — D631 Anemia in chronic kidney disease: Secondary | ICD-10-CM | POA: Diagnosis not present

## 2019-01-17 DIAGNOSIS — N2581 Secondary hyperparathyroidism of renal origin: Secondary | ICD-10-CM | POA: Diagnosis not present

## 2019-01-17 DIAGNOSIS — N186 End stage renal disease: Secondary | ICD-10-CM | POA: Diagnosis not present

## 2019-01-17 DIAGNOSIS — Z992 Dependence on renal dialysis: Secondary | ICD-10-CM | POA: Diagnosis not present

## 2019-01-18 DIAGNOSIS — N2581 Secondary hyperparathyroidism of renal origin: Secondary | ICD-10-CM | POA: Diagnosis not present

## 2019-01-18 DIAGNOSIS — Z992 Dependence on renal dialysis: Secondary | ICD-10-CM | POA: Diagnosis not present

## 2019-01-18 DIAGNOSIS — D631 Anemia in chronic kidney disease: Secondary | ICD-10-CM | POA: Diagnosis not present

## 2019-01-18 DIAGNOSIS — N186 End stage renal disease: Secondary | ICD-10-CM | POA: Diagnosis not present

## 2019-01-19 DIAGNOSIS — N2581 Secondary hyperparathyroidism of renal origin: Secondary | ICD-10-CM | POA: Diagnosis not present

## 2019-01-19 DIAGNOSIS — N186 End stage renal disease: Secondary | ICD-10-CM | POA: Diagnosis not present

## 2019-01-19 DIAGNOSIS — Z992 Dependence on renal dialysis: Secondary | ICD-10-CM | POA: Diagnosis not present

## 2019-01-19 DIAGNOSIS — D631 Anemia in chronic kidney disease: Secondary | ICD-10-CM | POA: Diagnosis not present

## 2019-01-20 DIAGNOSIS — D631 Anemia in chronic kidney disease: Secondary | ICD-10-CM | POA: Diagnosis not present

## 2019-01-20 DIAGNOSIS — Z992 Dependence on renal dialysis: Secondary | ICD-10-CM | POA: Diagnosis not present

## 2019-01-20 DIAGNOSIS — N186 End stage renal disease: Secondary | ICD-10-CM | POA: Diagnosis not present

## 2019-01-20 DIAGNOSIS — N2581 Secondary hyperparathyroidism of renal origin: Secondary | ICD-10-CM | POA: Diagnosis not present

## 2019-01-21 DIAGNOSIS — N2581 Secondary hyperparathyroidism of renal origin: Secondary | ICD-10-CM | POA: Diagnosis not present

## 2019-01-21 DIAGNOSIS — D631 Anemia in chronic kidney disease: Secondary | ICD-10-CM | POA: Diagnosis not present

## 2019-01-21 DIAGNOSIS — N186 End stage renal disease: Secondary | ICD-10-CM | POA: Diagnosis not present

## 2019-01-21 DIAGNOSIS — Z992 Dependence on renal dialysis: Secondary | ICD-10-CM | POA: Diagnosis not present

## 2019-01-22 DIAGNOSIS — D631 Anemia in chronic kidney disease: Secondary | ICD-10-CM | POA: Diagnosis not present

## 2019-01-22 DIAGNOSIS — N186 End stage renal disease: Secondary | ICD-10-CM | POA: Diagnosis not present

## 2019-01-22 DIAGNOSIS — Z992 Dependence on renal dialysis: Secondary | ICD-10-CM | POA: Diagnosis not present

## 2019-01-22 DIAGNOSIS — N2581 Secondary hyperparathyroidism of renal origin: Secondary | ICD-10-CM | POA: Diagnosis not present

## 2019-01-23 DIAGNOSIS — N2581 Secondary hyperparathyroidism of renal origin: Secondary | ICD-10-CM | POA: Diagnosis not present

## 2019-01-23 DIAGNOSIS — D631 Anemia in chronic kidney disease: Secondary | ICD-10-CM | POA: Diagnosis not present

## 2019-01-23 DIAGNOSIS — Z992 Dependence on renal dialysis: Secondary | ICD-10-CM | POA: Diagnosis not present

## 2019-01-23 DIAGNOSIS — N186 End stage renal disease: Secondary | ICD-10-CM | POA: Diagnosis not present

## 2019-01-24 DIAGNOSIS — N2581 Secondary hyperparathyroidism of renal origin: Secondary | ICD-10-CM | POA: Diagnosis not present

## 2019-01-24 DIAGNOSIS — N186 End stage renal disease: Secondary | ICD-10-CM | POA: Diagnosis not present

## 2019-01-24 DIAGNOSIS — D631 Anemia in chronic kidney disease: Secondary | ICD-10-CM | POA: Diagnosis not present

## 2019-01-24 DIAGNOSIS — Z992 Dependence on renal dialysis: Secondary | ICD-10-CM | POA: Diagnosis not present

## 2019-01-25 DIAGNOSIS — Z992 Dependence on renal dialysis: Secondary | ICD-10-CM | POA: Diagnosis not present

## 2019-01-25 DIAGNOSIS — N186 End stage renal disease: Secondary | ICD-10-CM | POA: Diagnosis not present

## 2019-01-25 DIAGNOSIS — D631 Anemia in chronic kidney disease: Secondary | ICD-10-CM | POA: Diagnosis not present

## 2019-01-25 DIAGNOSIS — N2581 Secondary hyperparathyroidism of renal origin: Secondary | ICD-10-CM | POA: Diagnosis not present

## 2019-01-26 DIAGNOSIS — D631 Anemia in chronic kidney disease: Secondary | ICD-10-CM | POA: Diagnosis not present

## 2019-01-26 DIAGNOSIS — N2581 Secondary hyperparathyroidism of renal origin: Secondary | ICD-10-CM | POA: Diagnosis not present

## 2019-01-26 DIAGNOSIS — N186 End stage renal disease: Secondary | ICD-10-CM | POA: Diagnosis not present

## 2019-01-26 DIAGNOSIS — Z992 Dependence on renal dialysis: Secondary | ICD-10-CM | POA: Diagnosis not present

## 2019-01-27 DIAGNOSIS — D631 Anemia in chronic kidney disease: Secondary | ICD-10-CM | POA: Diagnosis not present

## 2019-01-27 DIAGNOSIS — N186 End stage renal disease: Secondary | ICD-10-CM | POA: Diagnosis not present

## 2019-01-27 DIAGNOSIS — Z992 Dependence on renal dialysis: Secondary | ICD-10-CM | POA: Diagnosis not present

## 2019-01-27 DIAGNOSIS — N2581 Secondary hyperparathyroidism of renal origin: Secondary | ICD-10-CM | POA: Diagnosis not present

## 2019-01-28 DIAGNOSIS — Z992 Dependence on renal dialysis: Secondary | ICD-10-CM | POA: Diagnosis not present

## 2019-01-28 DIAGNOSIS — N186 End stage renal disease: Secondary | ICD-10-CM | POA: Diagnosis not present

## 2019-01-28 DIAGNOSIS — D631 Anemia in chronic kidney disease: Secondary | ICD-10-CM | POA: Diagnosis not present

## 2019-01-28 DIAGNOSIS — N2581 Secondary hyperparathyroidism of renal origin: Secondary | ICD-10-CM | POA: Diagnosis not present

## 2019-01-29 DIAGNOSIS — D509 Iron deficiency anemia, unspecified: Secondary | ICD-10-CM | POA: Diagnosis not present

## 2019-01-29 DIAGNOSIS — D631 Anemia in chronic kidney disease: Secondary | ICD-10-CM | POA: Diagnosis not present

## 2019-01-29 DIAGNOSIS — Z992 Dependence on renal dialysis: Secondary | ICD-10-CM | POA: Diagnosis not present

## 2019-01-29 DIAGNOSIS — N186 End stage renal disease: Secondary | ICD-10-CM | POA: Diagnosis not present

## 2019-01-30 DIAGNOSIS — N186 End stage renal disease: Secondary | ICD-10-CM | POA: Diagnosis not present

## 2019-01-30 DIAGNOSIS — Z992 Dependence on renal dialysis: Secondary | ICD-10-CM | POA: Diagnosis not present

## 2019-01-30 DIAGNOSIS — D631 Anemia in chronic kidney disease: Secondary | ICD-10-CM | POA: Diagnosis not present

## 2019-01-30 DIAGNOSIS — D509 Iron deficiency anemia, unspecified: Secondary | ICD-10-CM | POA: Diagnosis not present

## 2019-01-31 DIAGNOSIS — D509 Iron deficiency anemia, unspecified: Secondary | ICD-10-CM | POA: Diagnosis not present

## 2019-01-31 DIAGNOSIS — Z992 Dependence on renal dialysis: Secondary | ICD-10-CM | POA: Diagnosis not present

## 2019-01-31 DIAGNOSIS — D631 Anemia in chronic kidney disease: Secondary | ICD-10-CM | POA: Diagnosis not present

## 2019-01-31 DIAGNOSIS — N186 End stage renal disease: Secondary | ICD-10-CM | POA: Diagnosis not present

## 2019-02-01 DIAGNOSIS — D509 Iron deficiency anemia, unspecified: Secondary | ICD-10-CM | POA: Diagnosis not present

## 2019-02-01 DIAGNOSIS — Z992 Dependence on renal dialysis: Secondary | ICD-10-CM | POA: Diagnosis not present

## 2019-02-01 DIAGNOSIS — D631 Anemia in chronic kidney disease: Secondary | ICD-10-CM | POA: Diagnosis not present

## 2019-02-01 DIAGNOSIS — N186 End stage renal disease: Secondary | ICD-10-CM | POA: Diagnosis not present

## 2019-02-02 DIAGNOSIS — D631 Anemia in chronic kidney disease: Secondary | ICD-10-CM | POA: Diagnosis not present

## 2019-02-02 DIAGNOSIS — D509 Iron deficiency anemia, unspecified: Secondary | ICD-10-CM | POA: Diagnosis not present

## 2019-02-02 DIAGNOSIS — N186 End stage renal disease: Secondary | ICD-10-CM | POA: Diagnosis not present

## 2019-02-02 DIAGNOSIS — Z992 Dependence on renal dialysis: Secondary | ICD-10-CM | POA: Diagnosis not present

## 2019-02-03 DIAGNOSIS — Z992 Dependence on renal dialysis: Secondary | ICD-10-CM | POA: Diagnosis not present

## 2019-02-03 DIAGNOSIS — D631 Anemia in chronic kidney disease: Secondary | ICD-10-CM | POA: Diagnosis not present

## 2019-02-03 DIAGNOSIS — N186 End stage renal disease: Secondary | ICD-10-CM | POA: Diagnosis not present

## 2019-02-03 DIAGNOSIS — D509 Iron deficiency anemia, unspecified: Secondary | ICD-10-CM | POA: Diagnosis not present

## 2019-02-04 DIAGNOSIS — Z992 Dependence on renal dialysis: Secondary | ICD-10-CM | POA: Diagnosis not present

## 2019-02-04 DIAGNOSIS — D631 Anemia in chronic kidney disease: Secondary | ICD-10-CM | POA: Diagnosis not present

## 2019-02-04 DIAGNOSIS — D509 Iron deficiency anemia, unspecified: Secondary | ICD-10-CM | POA: Diagnosis not present

## 2019-02-04 DIAGNOSIS — N186 End stage renal disease: Secondary | ICD-10-CM | POA: Diagnosis not present

## 2019-02-05 DIAGNOSIS — N186 End stage renal disease: Secondary | ICD-10-CM | POA: Diagnosis not present

## 2019-02-05 DIAGNOSIS — D631 Anemia in chronic kidney disease: Secondary | ICD-10-CM | POA: Diagnosis not present

## 2019-02-05 DIAGNOSIS — D509 Iron deficiency anemia, unspecified: Secondary | ICD-10-CM | POA: Diagnosis not present

## 2019-02-05 DIAGNOSIS — Z992 Dependence on renal dialysis: Secondary | ICD-10-CM | POA: Diagnosis not present

## 2019-02-06 DIAGNOSIS — D509 Iron deficiency anemia, unspecified: Secondary | ICD-10-CM | POA: Diagnosis not present

## 2019-02-06 DIAGNOSIS — D631 Anemia in chronic kidney disease: Secondary | ICD-10-CM | POA: Diagnosis not present

## 2019-02-06 DIAGNOSIS — Z992 Dependence on renal dialysis: Secondary | ICD-10-CM | POA: Diagnosis not present

## 2019-02-06 DIAGNOSIS — N186 End stage renal disease: Secondary | ICD-10-CM | POA: Diagnosis not present

## 2019-02-07 DIAGNOSIS — D509 Iron deficiency anemia, unspecified: Secondary | ICD-10-CM | POA: Diagnosis not present

## 2019-02-07 DIAGNOSIS — N186 End stage renal disease: Secondary | ICD-10-CM | POA: Diagnosis not present

## 2019-02-07 DIAGNOSIS — D631 Anemia in chronic kidney disease: Secondary | ICD-10-CM | POA: Diagnosis not present

## 2019-02-07 DIAGNOSIS — Z992 Dependence on renal dialysis: Secondary | ICD-10-CM | POA: Diagnosis not present

## 2019-02-08 DIAGNOSIS — N186 End stage renal disease: Secondary | ICD-10-CM | POA: Diagnosis not present

## 2019-02-08 DIAGNOSIS — D509 Iron deficiency anemia, unspecified: Secondary | ICD-10-CM | POA: Diagnosis not present

## 2019-02-08 DIAGNOSIS — Z992 Dependence on renal dialysis: Secondary | ICD-10-CM | POA: Diagnosis not present

## 2019-02-08 DIAGNOSIS — D631 Anemia in chronic kidney disease: Secondary | ICD-10-CM | POA: Diagnosis not present

## 2019-02-09 DIAGNOSIS — D509 Iron deficiency anemia, unspecified: Secondary | ICD-10-CM | POA: Diagnosis not present

## 2019-02-09 DIAGNOSIS — N186 End stage renal disease: Secondary | ICD-10-CM | POA: Diagnosis not present

## 2019-02-09 DIAGNOSIS — Z992 Dependence on renal dialysis: Secondary | ICD-10-CM | POA: Diagnosis not present

## 2019-02-09 DIAGNOSIS — D631 Anemia in chronic kidney disease: Secondary | ICD-10-CM | POA: Diagnosis not present

## 2019-02-10 DIAGNOSIS — N186 End stage renal disease: Secondary | ICD-10-CM | POA: Diagnosis not present

## 2019-02-10 DIAGNOSIS — Z992 Dependence on renal dialysis: Secondary | ICD-10-CM | POA: Diagnosis not present

## 2019-02-10 DIAGNOSIS — D509 Iron deficiency anemia, unspecified: Secondary | ICD-10-CM | POA: Diagnosis not present

## 2019-02-10 DIAGNOSIS — D631 Anemia in chronic kidney disease: Secondary | ICD-10-CM | POA: Diagnosis not present

## 2019-02-11 DIAGNOSIS — N186 End stage renal disease: Secondary | ICD-10-CM | POA: Diagnosis not present

## 2019-02-11 DIAGNOSIS — Z992 Dependence on renal dialysis: Secondary | ICD-10-CM | POA: Diagnosis not present

## 2019-02-11 DIAGNOSIS — D509 Iron deficiency anemia, unspecified: Secondary | ICD-10-CM | POA: Diagnosis not present

## 2019-02-11 DIAGNOSIS — D631 Anemia in chronic kidney disease: Secondary | ICD-10-CM | POA: Diagnosis not present

## 2019-02-12 DIAGNOSIS — D631 Anemia in chronic kidney disease: Secondary | ICD-10-CM | POA: Diagnosis not present

## 2019-02-12 DIAGNOSIS — N186 End stage renal disease: Secondary | ICD-10-CM | POA: Diagnosis not present

## 2019-02-12 DIAGNOSIS — Z992 Dependence on renal dialysis: Secondary | ICD-10-CM | POA: Diagnosis not present

## 2019-02-12 DIAGNOSIS — D509 Iron deficiency anemia, unspecified: Secondary | ICD-10-CM | POA: Diagnosis not present

## 2019-02-13 DIAGNOSIS — Z992 Dependence on renal dialysis: Secondary | ICD-10-CM | POA: Diagnosis not present

## 2019-02-13 DIAGNOSIS — N186 End stage renal disease: Secondary | ICD-10-CM | POA: Diagnosis not present

## 2019-02-13 DIAGNOSIS — D509 Iron deficiency anemia, unspecified: Secondary | ICD-10-CM | POA: Diagnosis not present

## 2019-02-13 DIAGNOSIS — D631 Anemia in chronic kidney disease: Secondary | ICD-10-CM | POA: Diagnosis not present

## 2019-02-14 DIAGNOSIS — N186 End stage renal disease: Secondary | ICD-10-CM | POA: Diagnosis not present

## 2019-02-14 DIAGNOSIS — D631 Anemia in chronic kidney disease: Secondary | ICD-10-CM | POA: Diagnosis not present

## 2019-02-14 DIAGNOSIS — D509 Iron deficiency anemia, unspecified: Secondary | ICD-10-CM | POA: Diagnosis not present

## 2019-02-14 DIAGNOSIS — Z992 Dependence on renal dialysis: Secondary | ICD-10-CM | POA: Diagnosis not present

## 2019-02-15 DIAGNOSIS — Z992 Dependence on renal dialysis: Secondary | ICD-10-CM | POA: Diagnosis not present

## 2019-02-15 DIAGNOSIS — N186 End stage renal disease: Secondary | ICD-10-CM | POA: Diagnosis not present

## 2019-02-15 DIAGNOSIS — D631 Anemia in chronic kidney disease: Secondary | ICD-10-CM | POA: Diagnosis not present

## 2019-02-15 DIAGNOSIS — D509 Iron deficiency anemia, unspecified: Secondary | ICD-10-CM | POA: Diagnosis not present

## 2019-02-16 DIAGNOSIS — N186 End stage renal disease: Secondary | ICD-10-CM | POA: Diagnosis not present

## 2019-02-16 DIAGNOSIS — J189 Pneumonia, unspecified organism: Secondary | ICD-10-CM | POA: Diagnosis not present

## 2019-02-16 DIAGNOSIS — Z992 Dependence on renal dialysis: Secondary | ICD-10-CM | POA: Diagnosis not present

## 2019-02-16 DIAGNOSIS — D509 Iron deficiency anemia, unspecified: Secondary | ICD-10-CM | POA: Diagnosis not present

## 2019-02-16 DIAGNOSIS — D631 Anemia in chronic kidney disease: Secondary | ICD-10-CM | POA: Diagnosis not present

## 2019-02-17 DIAGNOSIS — N186 End stage renal disease: Secondary | ICD-10-CM | POA: Diagnosis not present

## 2019-02-17 DIAGNOSIS — Z992 Dependence on renal dialysis: Secondary | ICD-10-CM | POA: Diagnosis not present

## 2019-02-17 DIAGNOSIS — D509 Iron deficiency anemia, unspecified: Secondary | ICD-10-CM | POA: Diagnosis not present

## 2019-02-17 DIAGNOSIS — D631 Anemia in chronic kidney disease: Secondary | ICD-10-CM | POA: Diagnosis not present

## 2019-02-18 DIAGNOSIS — Z992 Dependence on renal dialysis: Secondary | ICD-10-CM | POA: Diagnosis not present

## 2019-02-18 DIAGNOSIS — N186 End stage renal disease: Secondary | ICD-10-CM | POA: Diagnosis not present

## 2019-02-18 DIAGNOSIS — D509 Iron deficiency anemia, unspecified: Secondary | ICD-10-CM | POA: Diagnosis not present

## 2019-02-18 DIAGNOSIS — D631 Anemia in chronic kidney disease: Secondary | ICD-10-CM | POA: Diagnosis not present

## 2019-02-19 DIAGNOSIS — D509 Iron deficiency anemia, unspecified: Secondary | ICD-10-CM | POA: Diagnosis not present

## 2019-02-19 DIAGNOSIS — Z992 Dependence on renal dialysis: Secondary | ICD-10-CM | POA: Diagnosis not present

## 2019-02-19 DIAGNOSIS — N186 End stage renal disease: Secondary | ICD-10-CM | POA: Diagnosis not present

## 2019-02-19 DIAGNOSIS — D631 Anemia in chronic kidney disease: Secondary | ICD-10-CM | POA: Diagnosis not present

## 2019-02-20 DIAGNOSIS — D509 Iron deficiency anemia, unspecified: Secondary | ICD-10-CM | POA: Diagnosis not present

## 2019-02-20 DIAGNOSIS — Z992 Dependence on renal dialysis: Secondary | ICD-10-CM | POA: Diagnosis not present

## 2019-02-20 DIAGNOSIS — N186 End stage renal disease: Secondary | ICD-10-CM | POA: Diagnosis not present

## 2019-02-20 DIAGNOSIS — D631 Anemia in chronic kidney disease: Secondary | ICD-10-CM | POA: Diagnosis not present

## 2019-02-21 DIAGNOSIS — D631 Anemia in chronic kidney disease: Secondary | ICD-10-CM | POA: Diagnosis not present

## 2019-02-21 DIAGNOSIS — Z992 Dependence on renal dialysis: Secondary | ICD-10-CM | POA: Diagnosis not present

## 2019-02-21 DIAGNOSIS — D509 Iron deficiency anemia, unspecified: Secondary | ICD-10-CM | POA: Diagnosis not present

## 2019-02-21 DIAGNOSIS — N186 End stage renal disease: Secondary | ICD-10-CM | POA: Diagnosis not present

## 2019-02-22 DIAGNOSIS — Z992 Dependence on renal dialysis: Secondary | ICD-10-CM | POA: Diagnosis not present

## 2019-02-22 DIAGNOSIS — N186 End stage renal disease: Secondary | ICD-10-CM | POA: Diagnosis not present

## 2019-02-22 DIAGNOSIS — D509 Iron deficiency anemia, unspecified: Secondary | ICD-10-CM | POA: Diagnosis not present

## 2019-02-22 DIAGNOSIS — D631 Anemia in chronic kidney disease: Secondary | ICD-10-CM | POA: Diagnosis not present

## 2019-02-23 DIAGNOSIS — Z992 Dependence on renal dialysis: Secondary | ICD-10-CM | POA: Diagnosis not present

## 2019-02-23 DIAGNOSIS — D509 Iron deficiency anemia, unspecified: Secondary | ICD-10-CM | POA: Diagnosis not present

## 2019-02-23 DIAGNOSIS — N186 End stage renal disease: Secondary | ICD-10-CM | POA: Diagnosis not present

## 2019-02-23 DIAGNOSIS — D631 Anemia in chronic kidney disease: Secondary | ICD-10-CM | POA: Diagnosis not present

## 2019-02-24 DIAGNOSIS — N186 End stage renal disease: Secondary | ICD-10-CM | POA: Diagnosis not present

## 2019-02-24 DIAGNOSIS — D631 Anemia in chronic kidney disease: Secondary | ICD-10-CM | POA: Diagnosis not present

## 2019-02-24 DIAGNOSIS — D509 Iron deficiency anemia, unspecified: Secondary | ICD-10-CM | POA: Diagnosis not present

## 2019-02-24 DIAGNOSIS — Z992 Dependence on renal dialysis: Secondary | ICD-10-CM | POA: Diagnosis not present

## 2019-02-25 DIAGNOSIS — Z992 Dependence on renal dialysis: Secondary | ICD-10-CM | POA: Diagnosis not present

## 2019-02-25 DIAGNOSIS — D631 Anemia in chronic kidney disease: Secondary | ICD-10-CM | POA: Diagnosis not present

## 2019-02-25 DIAGNOSIS — N186 End stage renal disease: Secondary | ICD-10-CM | POA: Diagnosis not present

## 2019-02-25 DIAGNOSIS — D509 Iron deficiency anemia, unspecified: Secondary | ICD-10-CM | POA: Diagnosis not present

## 2019-02-26 DIAGNOSIS — N186 End stage renal disease: Secondary | ICD-10-CM | POA: Diagnosis not present

## 2019-02-26 DIAGNOSIS — Z992 Dependence on renal dialysis: Secondary | ICD-10-CM | POA: Diagnosis not present

## 2019-02-26 DIAGNOSIS — D509 Iron deficiency anemia, unspecified: Secondary | ICD-10-CM | POA: Diagnosis not present

## 2019-02-26 DIAGNOSIS — D631 Anemia in chronic kidney disease: Secondary | ICD-10-CM | POA: Diagnosis not present

## 2019-02-27 DIAGNOSIS — N186 End stage renal disease: Secondary | ICD-10-CM | POA: Diagnosis not present

## 2019-02-27 DIAGNOSIS — D631 Anemia in chronic kidney disease: Secondary | ICD-10-CM | POA: Diagnosis not present

## 2019-02-27 DIAGNOSIS — Z992 Dependence on renal dialysis: Secondary | ICD-10-CM | POA: Diagnosis not present

## 2019-02-27 DIAGNOSIS — D509 Iron deficiency anemia, unspecified: Secondary | ICD-10-CM | POA: Diagnosis not present

## 2019-02-28 DIAGNOSIS — N186 End stage renal disease: Secondary | ICD-10-CM | POA: Diagnosis not present

## 2019-02-28 DIAGNOSIS — Z992 Dependence on renal dialysis: Secondary | ICD-10-CM | POA: Diagnosis not present

## 2019-02-28 DIAGNOSIS — D631 Anemia in chronic kidney disease: Secondary | ICD-10-CM | POA: Diagnosis not present

## 2019-02-28 DIAGNOSIS — D509 Iron deficiency anemia, unspecified: Secondary | ICD-10-CM | POA: Diagnosis not present

## 2019-03-01 DIAGNOSIS — N186 End stage renal disease: Secondary | ICD-10-CM | POA: Diagnosis not present

## 2019-03-01 DIAGNOSIS — D631 Anemia in chronic kidney disease: Secondary | ICD-10-CM | POA: Diagnosis not present

## 2019-03-01 DIAGNOSIS — Z992 Dependence on renal dialysis: Secondary | ICD-10-CM | POA: Diagnosis not present

## 2019-03-02 DIAGNOSIS — D631 Anemia in chronic kidney disease: Secondary | ICD-10-CM | POA: Diagnosis not present

## 2019-03-02 DIAGNOSIS — Z992 Dependence on renal dialysis: Secondary | ICD-10-CM | POA: Diagnosis not present

## 2019-03-02 DIAGNOSIS — N186 End stage renal disease: Secondary | ICD-10-CM | POA: Diagnosis not present

## 2019-03-03 DIAGNOSIS — Z992 Dependence on renal dialysis: Secondary | ICD-10-CM | POA: Diagnosis not present

## 2019-03-03 DIAGNOSIS — N186 End stage renal disease: Secondary | ICD-10-CM | POA: Diagnosis not present

## 2019-03-03 DIAGNOSIS — D631 Anemia in chronic kidney disease: Secondary | ICD-10-CM | POA: Diagnosis not present

## 2019-03-04 DIAGNOSIS — D631 Anemia in chronic kidney disease: Secondary | ICD-10-CM | POA: Diagnosis not present

## 2019-03-04 DIAGNOSIS — Z992 Dependence on renal dialysis: Secondary | ICD-10-CM | POA: Diagnosis not present

## 2019-03-04 DIAGNOSIS — N186 End stage renal disease: Secondary | ICD-10-CM | POA: Diagnosis not present

## 2019-03-05 DIAGNOSIS — N186 End stage renal disease: Secondary | ICD-10-CM | POA: Diagnosis not present

## 2019-03-05 DIAGNOSIS — Z992 Dependence on renal dialysis: Secondary | ICD-10-CM | POA: Diagnosis not present

## 2019-03-05 DIAGNOSIS — D631 Anemia in chronic kidney disease: Secondary | ICD-10-CM | POA: Diagnosis not present

## 2019-03-06 DIAGNOSIS — D631 Anemia in chronic kidney disease: Secondary | ICD-10-CM | POA: Diagnosis not present

## 2019-03-06 DIAGNOSIS — Z992 Dependence on renal dialysis: Secondary | ICD-10-CM | POA: Diagnosis not present

## 2019-03-06 DIAGNOSIS — N186 End stage renal disease: Secondary | ICD-10-CM | POA: Diagnosis not present

## 2019-03-07 DIAGNOSIS — N186 End stage renal disease: Secondary | ICD-10-CM | POA: Diagnosis not present

## 2019-03-07 DIAGNOSIS — Z992 Dependence on renal dialysis: Secondary | ICD-10-CM | POA: Diagnosis not present

## 2019-03-07 DIAGNOSIS — D631 Anemia in chronic kidney disease: Secondary | ICD-10-CM | POA: Diagnosis not present

## 2019-03-08 DIAGNOSIS — D631 Anemia in chronic kidney disease: Secondary | ICD-10-CM | POA: Diagnosis not present

## 2019-03-08 DIAGNOSIS — Z992 Dependence on renal dialysis: Secondary | ICD-10-CM | POA: Diagnosis not present

## 2019-03-08 DIAGNOSIS — N186 End stage renal disease: Secondary | ICD-10-CM | POA: Diagnosis not present

## 2019-03-09 DIAGNOSIS — Z992 Dependence on renal dialysis: Secondary | ICD-10-CM | POA: Diagnosis not present

## 2019-03-09 DIAGNOSIS — D631 Anemia in chronic kidney disease: Secondary | ICD-10-CM | POA: Diagnosis not present

## 2019-03-09 DIAGNOSIS — N186 End stage renal disease: Secondary | ICD-10-CM | POA: Diagnosis not present

## 2019-03-10 DIAGNOSIS — N186 End stage renal disease: Secondary | ICD-10-CM | POA: Diagnosis not present

## 2019-03-10 DIAGNOSIS — Z992 Dependence on renal dialysis: Secondary | ICD-10-CM | POA: Diagnosis not present

## 2019-03-10 DIAGNOSIS — D631 Anemia in chronic kidney disease: Secondary | ICD-10-CM | POA: Diagnosis not present

## 2019-03-11 DIAGNOSIS — Z992 Dependence on renal dialysis: Secondary | ICD-10-CM | POA: Diagnosis not present

## 2019-03-11 DIAGNOSIS — N186 End stage renal disease: Secondary | ICD-10-CM | POA: Diagnosis not present

## 2019-03-11 DIAGNOSIS — D631 Anemia in chronic kidney disease: Secondary | ICD-10-CM | POA: Diagnosis not present

## 2019-03-12 DIAGNOSIS — D631 Anemia in chronic kidney disease: Secondary | ICD-10-CM | POA: Diagnosis not present

## 2019-03-12 DIAGNOSIS — Z992 Dependence on renal dialysis: Secondary | ICD-10-CM | POA: Diagnosis not present

## 2019-03-12 DIAGNOSIS — N186 End stage renal disease: Secondary | ICD-10-CM | POA: Diagnosis not present

## 2019-03-13 DIAGNOSIS — Z992 Dependence on renal dialysis: Secondary | ICD-10-CM | POA: Diagnosis not present

## 2019-03-13 DIAGNOSIS — N186 End stage renal disease: Secondary | ICD-10-CM | POA: Diagnosis not present

## 2019-03-13 DIAGNOSIS — D631 Anemia in chronic kidney disease: Secondary | ICD-10-CM | POA: Diagnosis not present

## 2019-03-14 DIAGNOSIS — N186 End stage renal disease: Secondary | ICD-10-CM | POA: Diagnosis not present

## 2019-03-14 DIAGNOSIS — D631 Anemia in chronic kidney disease: Secondary | ICD-10-CM | POA: Diagnosis not present

## 2019-03-14 DIAGNOSIS — Z992 Dependence on renal dialysis: Secondary | ICD-10-CM | POA: Diagnosis not present

## 2019-03-15 DIAGNOSIS — Z992 Dependence on renal dialysis: Secondary | ICD-10-CM | POA: Diagnosis not present

## 2019-03-15 DIAGNOSIS — N186 End stage renal disease: Secondary | ICD-10-CM | POA: Diagnosis not present

## 2019-03-15 DIAGNOSIS — D631 Anemia in chronic kidney disease: Secondary | ICD-10-CM | POA: Diagnosis not present

## 2019-03-16 DIAGNOSIS — D631 Anemia in chronic kidney disease: Secondary | ICD-10-CM | POA: Diagnosis not present

## 2019-03-16 DIAGNOSIS — N186 End stage renal disease: Secondary | ICD-10-CM | POA: Diagnosis not present

## 2019-03-16 DIAGNOSIS — Z992 Dependence on renal dialysis: Secondary | ICD-10-CM | POA: Diagnosis not present

## 2019-03-17 DIAGNOSIS — Z992 Dependence on renal dialysis: Secondary | ICD-10-CM | POA: Diagnosis not present

## 2019-03-17 DIAGNOSIS — D631 Anemia in chronic kidney disease: Secondary | ICD-10-CM | POA: Diagnosis not present

## 2019-03-17 DIAGNOSIS — N186 End stage renal disease: Secondary | ICD-10-CM | POA: Diagnosis not present

## 2019-03-18 DIAGNOSIS — N186 End stage renal disease: Secondary | ICD-10-CM | POA: Diagnosis not present

## 2019-03-18 DIAGNOSIS — Z992 Dependence on renal dialysis: Secondary | ICD-10-CM | POA: Diagnosis not present

## 2019-03-18 DIAGNOSIS — D631 Anemia in chronic kidney disease: Secondary | ICD-10-CM | POA: Diagnosis not present

## 2019-03-19 DIAGNOSIS — N186 End stage renal disease: Secondary | ICD-10-CM | POA: Diagnosis not present

## 2019-03-19 DIAGNOSIS — D631 Anemia in chronic kidney disease: Secondary | ICD-10-CM | POA: Diagnosis not present

## 2019-03-19 DIAGNOSIS — Z992 Dependence on renal dialysis: Secondary | ICD-10-CM | POA: Diagnosis not present

## 2019-03-20 DIAGNOSIS — D631 Anemia in chronic kidney disease: Secondary | ICD-10-CM | POA: Diagnosis not present

## 2019-03-20 DIAGNOSIS — N186 End stage renal disease: Secondary | ICD-10-CM | POA: Diagnosis not present

## 2019-03-20 DIAGNOSIS — Z992 Dependence on renal dialysis: Secondary | ICD-10-CM | POA: Diagnosis not present

## 2019-03-21 DIAGNOSIS — N261 Atrophy of kidney (terminal): Secondary | ICD-10-CM | POA: Diagnosis not present

## 2019-03-21 DIAGNOSIS — R0982 Postnasal drip: Secondary | ICD-10-CM | POA: Diagnosis present

## 2019-03-21 DIAGNOSIS — I700 Atherosclerosis of aorta: Secondary | ICD-10-CM | POA: Diagnosis not present

## 2019-03-21 DIAGNOSIS — Z03818 Encounter for observation for suspected exposure to other biological agents ruled out: Secondary | ICD-10-CM | POA: Diagnosis not present

## 2019-03-21 DIAGNOSIS — K760 Fatty (change of) liver, not elsewhere classified: Secondary | ICD-10-CM | POA: Diagnosis not present

## 2019-03-21 DIAGNOSIS — T8571XA Infection and inflammatory reaction due to peritoneal dialysis catheter, initial encounter: Secondary | ICD-10-CM | POA: Diagnosis not present

## 2019-03-21 DIAGNOSIS — Z4902 Encounter for fitting and adjustment of peritoneal dialysis catheter: Secondary | ICD-10-CM | POA: Diagnosis not present

## 2019-03-21 DIAGNOSIS — R939 Diagnostic imaging inconclusive due to excess body fat of patient: Secondary | ICD-10-CM | POA: Diagnosis not present

## 2019-03-21 DIAGNOSIS — K652 Spontaneous bacterial peritonitis: Secondary | ICD-10-CM | POA: Diagnosis not present

## 2019-03-21 DIAGNOSIS — Z992 Dependence on renal dialysis: Secondary | ICD-10-CM | POA: Diagnosis not present

## 2019-03-21 DIAGNOSIS — D638 Anemia in other chronic diseases classified elsewhere: Secondary | ICD-10-CM | POA: Diagnosis not present

## 2019-03-21 DIAGNOSIS — E8770 Fluid overload, unspecified: Secondary | ICD-10-CM | POA: Diagnosis not present

## 2019-03-21 DIAGNOSIS — D72829 Elevated white blood cell count, unspecified: Secondary | ICD-10-CM | POA: Diagnosis not present

## 2019-03-21 DIAGNOSIS — R109 Unspecified abdominal pain: Secondary | ICD-10-CM | POA: Diagnosis not present

## 2019-03-21 DIAGNOSIS — I120 Hypertensive chronic kidney disease with stage 5 chronic kidney disease or end stage renal disease: Secondary | ICD-10-CM | POA: Diagnosis not present

## 2019-03-21 DIAGNOSIS — E875 Hyperkalemia: Secondary | ICD-10-CM | POA: Diagnosis not present

## 2019-03-21 DIAGNOSIS — Z452 Encounter for adjustment and management of vascular access device: Secondary | ICD-10-CM | POA: Diagnosis not present

## 2019-03-21 DIAGNOSIS — G2581 Restless legs syndrome: Secondary | ICD-10-CM | POA: Diagnosis not present

## 2019-03-21 DIAGNOSIS — R112 Nausea with vomiting, unspecified: Secondary | ICD-10-CM | POA: Diagnosis not present

## 2019-03-21 DIAGNOSIS — Z9115 Patient's noncompliance with renal dialysis: Secondary | ICD-10-CM | POA: Diagnosis not present

## 2019-03-21 DIAGNOSIS — D631 Anemia in chronic kidney disease: Secondary | ICD-10-CM | POA: Diagnosis not present

## 2019-03-21 DIAGNOSIS — K219 Gastro-esophageal reflux disease without esophagitis: Secondary | ICD-10-CM | POA: Diagnosis not present

## 2019-03-21 DIAGNOSIS — K449 Diaphragmatic hernia without obstruction or gangrene: Secondary | ICD-10-CM | POA: Diagnosis not present

## 2019-03-21 DIAGNOSIS — Z20828 Contact with and (suspected) exposure to other viral communicable diseases: Secondary | ICD-10-CM | POA: Diagnosis not present

## 2019-03-21 DIAGNOSIS — Z86711 Personal history of pulmonary embolism: Secondary | ICD-10-CM | POA: Diagnosis not present

## 2019-03-21 DIAGNOSIS — Z6841 Body Mass Index (BMI) 40.0 and over, adult: Secondary | ICD-10-CM | POA: Diagnosis not present

## 2019-03-21 DIAGNOSIS — N186 End stage renal disease: Secondary | ICD-10-CM | POA: Diagnosis not present

## 2019-03-21 DIAGNOSIS — R05 Cough: Secondary | ICD-10-CM | POA: Diagnosis not present

## 2019-03-21 DIAGNOSIS — R0602 Shortness of breath: Secondary | ICD-10-CM | POA: Diagnosis not present

## 2019-03-21 DIAGNOSIS — R140 Abdominal distension (gaseous): Secondary | ICD-10-CM | POA: Diagnosis not present

## 2019-03-21 DIAGNOSIS — I10 Essential (primary) hypertension: Secondary | ICD-10-CM | POA: Diagnosis not present

## 2019-03-29 DIAGNOSIS — Z992 Dependence on renal dialysis: Secondary | ICD-10-CM | POA: Diagnosis not present

## 2019-03-29 DIAGNOSIS — D631 Anemia in chronic kidney disease: Secondary | ICD-10-CM | POA: Diagnosis not present

## 2019-03-29 DIAGNOSIS — N186 End stage renal disease: Secondary | ICD-10-CM | POA: Diagnosis not present

## 2019-03-31 DIAGNOSIS — D631 Anemia in chronic kidney disease: Secondary | ICD-10-CM | POA: Diagnosis not present

## 2019-03-31 DIAGNOSIS — Z992 Dependence on renal dialysis: Secondary | ICD-10-CM | POA: Diagnosis not present

## 2019-03-31 DIAGNOSIS — Z23 Encounter for immunization: Secondary | ICD-10-CM | POA: Diagnosis not present

## 2019-03-31 DIAGNOSIS — N186 End stage renal disease: Secondary | ICD-10-CM | POA: Diagnosis not present

## 2019-03-31 DIAGNOSIS — N2581 Secondary hyperparathyroidism of renal origin: Secondary | ICD-10-CM | POA: Diagnosis not present

## 2019-03-31 DIAGNOSIS — K659 Peritonitis, unspecified: Secondary | ICD-10-CM | POA: Diagnosis not present

## 2019-04-01 DIAGNOSIS — K659 Peritonitis, unspecified: Secondary | ICD-10-CM | POA: Diagnosis not present

## 2019-04-01 DIAGNOSIS — N2581 Secondary hyperparathyroidism of renal origin: Secondary | ICD-10-CM | POA: Diagnosis not present

## 2019-04-01 DIAGNOSIS — N186 End stage renal disease: Secondary | ICD-10-CM | POA: Diagnosis not present

## 2019-04-01 DIAGNOSIS — Z992 Dependence on renal dialysis: Secondary | ICD-10-CM | POA: Diagnosis not present

## 2019-04-01 DIAGNOSIS — Z23 Encounter for immunization: Secondary | ICD-10-CM | POA: Diagnosis not present

## 2019-04-01 DIAGNOSIS — D631 Anemia in chronic kidney disease: Secondary | ICD-10-CM | POA: Diagnosis not present

## 2019-04-02 DIAGNOSIS — K659 Peritonitis, unspecified: Secondary | ICD-10-CM | POA: Diagnosis not present

## 2019-04-02 DIAGNOSIS — N186 End stage renal disease: Secondary | ICD-10-CM | POA: Diagnosis not present

## 2019-04-02 DIAGNOSIS — D631 Anemia in chronic kidney disease: Secondary | ICD-10-CM | POA: Diagnosis not present

## 2019-04-02 DIAGNOSIS — Z992 Dependence on renal dialysis: Secondary | ICD-10-CM | POA: Diagnosis not present

## 2019-04-02 DIAGNOSIS — Z23 Encounter for immunization: Secondary | ICD-10-CM | POA: Diagnosis not present

## 2019-04-02 DIAGNOSIS — N2581 Secondary hyperparathyroidism of renal origin: Secondary | ICD-10-CM | POA: Diagnosis not present

## 2019-04-03 DIAGNOSIS — N2581 Secondary hyperparathyroidism of renal origin: Secondary | ICD-10-CM | POA: Diagnosis not present

## 2019-04-03 DIAGNOSIS — Z23 Encounter for immunization: Secondary | ICD-10-CM | POA: Diagnosis not present

## 2019-04-03 DIAGNOSIS — D631 Anemia in chronic kidney disease: Secondary | ICD-10-CM | POA: Diagnosis not present

## 2019-04-03 DIAGNOSIS — Z992 Dependence on renal dialysis: Secondary | ICD-10-CM | POA: Diagnosis not present

## 2019-04-03 DIAGNOSIS — N186 End stage renal disease: Secondary | ICD-10-CM | POA: Diagnosis not present

## 2019-04-03 DIAGNOSIS — K659 Peritonitis, unspecified: Secondary | ICD-10-CM | POA: Diagnosis not present

## 2019-04-04 DIAGNOSIS — N186 End stage renal disease: Secondary | ICD-10-CM | POA: Diagnosis not present

## 2019-04-04 DIAGNOSIS — D631 Anemia in chronic kidney disease: Secondary | ICD-10-CM | POA: Diagnosis not present

## 2019-04-04 DIAGNOSIS — N2581 Secondary hyperparathyroidism of renal origin: Secondary | ICD-10-CM | POA: Diagnosis not present

## 2019-04-04 DIAGNOSIS — K659 Peritonitis, unspecified: Secondary | ICD-10-CM | POA: Diagnosis not present

## 2019-04-04 DIAGNOSIS — Z992 Dependence on renal dialysis: Secondary | ICD-10-CM | POA: Diagnosis not present

## 2019-04-04 DIAGNOSIS — Z23 Encounter for immunization: Secondary | ICD-10-CM | POA: Diagnosis not present

## 2019-04-05 DIAGNOSIS — D631 Anemia in chronic kidney disease: Secondary | ICD-10-CM | POA: Diagnosis not present

## 2019-04-05 DIAGNOSIS — N186 End stage renal disease: Secondary | ICD-10-CM | POA: Diagnosis not present

## 2019-04-05 DIAGNOSIS — N2581 Secondary hyperparathyroidism of renal origin: Secondary | ICD-10-CM | POA: Diagnosis not present

## 2019-04-05 DIAGNOSIS — Z23 Encounter for immunization: Secondary | ICD-10-CM | POA: Diagnosis not present

## 2019-04-05 DIAGNOSIS — K659 Peritonitis, unspecified: Secondary | ICD-10-CM | POA: Diagnosis not present

## 2019-04-05 DIAGNOSIS — Z992 Dependence on renal dialysis: Secondary | ICD-10-CM | POA: Diagnosis not present

## 2019-04-06 DIAGNOSIS — K659 Peritonitis, unspecified: Secondary | ICD-10-CM | POA: Diagnosis not present

## 2019-04-06 DIAGNOSIS — D631 Anemia in chronic kidney disease: Secondary | ICD-10-CM | POA: Diagnosis not present

## 2019-04-06 DIAGNOSIS — N186 End stage renal disease: Secondary | ICD-10-CM | POA: Diagnosis not present

## 2019-04-06 DIAGNOSIS — Z992 Dependence on renal dialysis: Secondary | ICD-10-CM | POA: Diagnosis not present

## 2019-04-06 DIAGNOSIS — Z23 Encounter for immunization: Secondary | ICD-10-CM | POA: Diagnosis not present

## 2019-04-06 DIAGNOSIS — N2581 Secondary hyperparathyroidism of renal origin: Secondary | ICD-10-CM | POA: Diagnosis not present

## 2019-04-07 DIAGNOSIS — K659 Peritonitis, unspecified: Secondary | ICD-10-CM | POA: Diagnosis not present

## 2019-04-07 DIAGNOSIS — D631 Anemia in chronic kidney disease: Secondary | ICD-10-CM | POA: Diagnosis not present

## 2019-04-07 DIAGNOSIS — N2581 Secondary hyperparathyroidism of renal origin: Secondary | ICD-10-CM | POA: Diagnosis not present

## 2019-04-07 DIAGNOSIS — Z992 Dependence on renal dialysis: Secondary | ICD-10-CM | POA: Diagnosis not present

## 2019-04-07 DIAGNOSIS — Z23 Encounter for immunization: Secondary | ICD-10-CM | POA: Diagnosis not present

## 2019-04-07 DIAGNOSIS — N186 End stage renal disease: Secondary | ICD-10-CM | POA: Diagnosis not present

## 2019-04-08 DIAGNOSIS — Z23 Encounter for immunization: Secondary | ICD-10-CM | POA: Diagnosis not present

## 2019-04-08 DIAGNOSIS — N186 End stage renal disease: Secondary | ICD-10-CM | POA: Diagnosis not present

## 2019-04-08 DIAGNOSIS — K659 Peritonitis, unspecified: Secondary | ICD-10-CM | POA: Diagnosis not present

## 2019-04-08 DIAGNOSIS — N2581 Secondary hyperparathyroidism of renal origin: Secondary | ICD-10-CM | POA: Diagnosis not present

## 2019-04-08 DIAGNOSIS — D631 Anemia in chronic kidney disease: Secondary | ICD-10-CM | POA: Diagnosis not present

## 2019-04-08 DIAGNOSIS — Z992 Dependence on renal dialysis: Secondary | ICD-10-CM | POA: Diagnosis not present

## 2019-04-09 DIAGNOSIS — N186 End stage renal disease: Secondary | ICD-10-CM | POA: Diagnosis not present

## 2019-04-09 DIAGNOSIS — M1990 Unspecified osteoarthritis, unspecified site: Secondary | ICD-10-CM | POA: Diagnosis not present

## 2019-04-09 DIAGNOSIS — Z23 Encounter for immunization: Secondary | ICD-10-CM | POA: Diagnosis not present

## 2019-04-09 DIAGNOSIS — Z79899 Other long term (current) drug therapy: Secondary | ICD-10-CM | POA: Diagnosis not present

## 2019-04-09 DIAGNOSIS — Z992 Dependence on renal dialysis: Secondary | ICD-10-CM | POA: Diagnosis not present

## 2019-04-09 DIAGNOSIS — N2581 Secondary hyperparathyroidism of renal origin: Secondary | ICD-10-CM | POA: Diagnosis not present

## 2019-04-09 DIAGNOSIS — D631 Anemia in chronic kidney disease: Secondary | ICD-10-CM | POA: Diagnosis not present

## 2019-04-09 DIAGNOSIS — I120 Hypertensive chronic kidney disease with stage 5 chronic kidney disease or end stage renal disease: Secondary | ICD-10-CM | POA: Diagnosis not present

## 2019-04-09 DIAGNOSIS — L509 Urticaria, unspecified: Secondary | ICD-10-CM | POA: Diagnosis not present

## 2019-04-09 DIAGNOSIS — K659 Peritonitis, unspecified: Secondary | ICD-10-CM | POA: Diagnosis not present

## 2019-04-09 DIAGNOSIS — K219 Gastro-esophageal reflux disease without esophagitis: Secondary | ICD-10-CM | POA: Diagnosis not present

## 2019-04-09 DIAGNOSIS — G2581 Restless legs syndrome: Secondary | ICD-10-CM | POA: Diagnosis not present

## 2019-04-09 DIAGNOSIS — Z6839 Body mass index (BMI) 39.0-39.9, adult: Secondary | ICD-10-CM | POA: Diagnosis not present

## 2019-04-10 DIAGNOSIS — Z23 Encounter for immunization: Secondary | ICD-10-CM | POA: Diagnosis not present

## 2019-04-10 DIAGNOSIS — D631 Anemia in chronic kidney disease: Secondary | ICD-10-CM | POA: Diagnosis not present

## 2019-04-10 DIAGNOSIS — N2581 Secondary hyperparathyroidism of renal origin: Secondary | ICD-10-CM | POA: Diagnosis not present

## 2019-04-10 DIAGNOSIS — N186 End stage renal disease: Secondary | ICD-10-CM | POA: Diagnosis not present

## 2019-04-10 DIAGNOSIS — Z992 Dependence on renal dialysis: Secondary | ICD-10-CM | POA: Diagnosis not present

## 2019-04-10 DIAGNOSIS — K659 Peritonitis, unspecified: Secondary | ICD-10-CM | POA: Diagnosis not present

## 2019-04-11 DIAGNOSIS — D631 Anemia in chronic kidney disease: Secondary | ICD-10-CM | POA: Diagnosis not present

## 2019-04-11 DIAGNOSIS — Z23 Encounter for immunization: Secondary | ICD-10-CM | POA: Diagnosis not present

## 2019-04-11 DIAGNOSIS — K659 Peritonitis, unspecified: Secondary | ICD-10-CM | POA: Diagnosis not present

## 2019-04-11 DIAGNOSIS — N2581 Secondary hyperparathyroidism of renal origin: Secondary | ICD-10-CM | POA: Diagnosis not present

## 2019-04-11 DIAGNOSIS — N186 End stage renal disease: Secondary | ICD-10-CM | POA: Diagnosis not present

## 2019-04-11 DIAGNOSIS — Z992 Dependence on renal dialysis: Secondary | ICD-10-CM | POA: Diagnosis not present

## 2019-04-12 DIAGNOSIS — N186 End stage renal disease: Secondary | ICD-10-CM | POA: Diagnosis not present

## 2019-04-12 DIAGNOSIS — Z992 Dependence on renal dialysis: Secondary | ICD-10-CM | POA: Diagnosis not present

## 2019-04-12 DIAGNOSIS — N2581 Secondary hyperparathyroidism of renal origin: Secondary | ICD-10-CM | POA: Diagnosis not present

## 2019-04-12 DIAGNOSIS — D631 Anemia in chronic kidney disease: Secondary | ICD-10-CM | POA: Diagnosis not present

## 2019-04-12 DIAGNOSIS — Z23 Encounter for immunization: Secondary | ICD-10-CM | POA: Diagnosis not present

## 2019-04-12 DIAGNOSIS — K659 Peritonitis, unspecified: Secondary | ICD-10-CM | POA: Diagnosis not present

## 2019-04-13 DIAGNOSIS — Z23 Encounter for immunization: Secondary | ICD-10-CM | POA: Diagnosis not present

## 2019-04-13 DIAGNOSIS — N186 End stage renal disease: Secondary | ICD-10-CM | POA: Diagnosis not present

## 2019-04-13 DIAGNOSIS — K659 Peritonitis, unspecified: Secondary | ICD-10-CM | POA: Diagnosis not present

## 2019-04-13 DIAGNOSIS — N2581 Secondary hyperparathyroidism of renal origin: Secondary | ICD-10-CM | POA: Diagnosis not present

## 2019-04-13 DIAGNOSIS — D631 Anemia in chronic kidney disease: Secondary | ICD-10-CM | POA: Diagnosis not present

## 2019-04-13 DIAGNOSIS — Z992 Dependence on renal dialysis: Secondary | ICD-10-CM | POA: Diagnosis not present

## 2019-04-14 DIAGNOSIS — Z6841 Body Mass Index (BMI) 40.0 and over, adult: Secondary | ICD-10-CM | POA: Diagnosis not present

## 2019-04-14 DIAGNOSIS — Z7689 Persons encountering health services in other specified circumstances: Secondary | ICD-10-CM | POA: Diagnosis not present

## 2019-04-14 DIAGNOSIS — D631 Anemia in chronic kidney disease: Secondary | ICD-10-CM | POA: Diagnosis not present

## 2019-04-14 DIAGNOSIS — K659 Peritonitis, unspecified: Secondary | ICD-10-CM | POA: Diagnosis not present

## 2019-04-14 DIAGNOSIS — L270 Generalized skin eruption due to drugs and medicaments taken internally: Secondary | ICD-10-CM | POA: Diagnosis not present

## 2019-04-14 DIAGNOSIS — N185 Chronic kidney disease, stage 5: Secondary | ICD-10-CM | POA: Diagnosis not present

## 2019-04-14 DIAGNOSIS — Z23 Encounter for immunization: Secondary | ICD-10-CM | POA: Diagnosis not present

## 2019-04-14 DIAGNOSIS — Z992 Dependence on renal dialysis: Secondary | ICD-10-CM | POA: Diagnosis not present

## 2019-04-14 DIAGNOSIS — N2581 Secondary hyperparathyroidism of renal origin: Secondary | ICD-10-CM | POA: Diagnosis not present

## 2019-04-14 DIAGNOSIS — N186 End stage renal disease: Secondary | ICD-10-CM | POA: Diagnosis not present

## 2019-04-15 DIAGNOSIS — Z23 Encounter for immunization: Secondary | ICD-10-CM | POA: Diagnosis not present

## 2019-04-15 DIAGNOSIS — Z992 Dependence on renal dialysis: Secondary | ICD-10-CM | POA: Diagnosis not present

## 2019-04-15 DIAGNOSIS — D631 Anemia in chronic kidney disease: Secondary | ICD-10-CM | POA: Diagnosis not present

## 2019-04-15 DIAGNOSIS — N186 End stage renal disease: Secondary | ICD-10-CM | POA: Diagnosis not present

## 2019-04-15 DIAGNOSIS — K659 Peritonitis, unspecified: Secondary | ICD-10-CM | POA: Diagnosis not present

## 2019-04-15 DIAGNOSIS — N2581 Secondary hyperparathyroidism of renal origin: Secondary | ICD-10-CM | POA: Diagnosis not present

## 2019-04-16 DIAGNOSIS — Z23 Encounter for immunization: Secondary | ICD-10-CM | POA: Diagnosis not present

## 2019-04-16 DIAGNOSIS — D631 Anemia in chronic kidney disease: Secondary | ICD-10-CM | POA: Diagnosis not present

## 2019-04-16 DIAGNOSIS — N2581 Secondary hyperparathyroidism of renal origin: Secondary | ICD-10-CM | POA: Diagnosis not present

## 2019-04-16 DIAGNOSIS — N186 End stage renal disease: Secondary | ICD-10-CM | POA: Diagnosis not present

## 2019-04-16 DIAGNOSIS — Z992 Dependence on renal dialysis: Secondary | ICD-10-CM | POA: Diagnosis not present

## 2019-04-16 DIAGNOSIS — K659 Peritonitis, unspecified: Secondary | ICD-10-CM | POA: Diagnosis not present

## 2019-04-17 DIAGNOSIS — A419 Sepsis, unspecified organism: Secondary | ICD-10-CM | POA: Diagnosis not present

## 2019-04-17 DIAGNOSIS — R1030 Lower abdominal pain, unspecified: Secondary | ICD-10-CM | POA: Diagnosis not present

## 2019-04-17 DIAGNOSIS — D649 Anemia, unspecified: Secondary | ICD-10-CM | POA: Diagnosis not present

## 2019-04-17 DIAGNOSIS — K5780 Diverticulitis of intestine, part unspecified, with perforation and abscess without bleeding: Secondary | ICD-10-CM | POA: Diagnosis not present

## 2019-04-17 DIAGNOSIS — Z992 Dependence on renal dialysis: Secondary | ICD-10-CM | POA: Diagnosis not present

## 2019-04-17 DIAGNOSIS — K659 Peritonitis, unspecified: Secondary | ICD-10-CM | POA: Diagnosis not present

## 2019-04-17 DIAGNOSIS — D631 Anemia in chronic kidney disease: Secondary | ICD-10-CM | POA: Diagnosis not present

## 2019-04-17 DIAGNOSIS — R1084 Generalized abdominal pain: Secondary | ICD-10-CM | POA: Diagnosis not present

## 2019-04-17 DIAGNOSIS — I700 Atherosclerosis of aorta: Secondary | ICD-10-CM | POA: Diagnosis not present

## 2019-04-17 DIAGNOSIS — Z23 Encounter for immunization: Secondary | ICD-10-CM | POA: Diagnosis not present

## 2019-04-17 DIAGNOSIS — K760 Fatty (change of) liver, not elsewhere classified: Secondary | ICD-10-CM | POA: Diagnosis not present

## 2019-04-17 DIAGNOSIS — K5720 Diverticulitis of large intestine with perforation and abscess without bleeding: Secondary | ICD-10-CM | POA: Diagnosis not present

## 2019-04-17 DIAGNOSIS — N2581 Secondary hyperparathyroidism of renal origin: Secondary | ICD-10-CM | POA: Diagnosis not present

## 2019-04-17 DIAGNOSIS — R110 Nausea: Secondary | ICD-10-CM | POA: Diagnosis not present

## 2019-04-17 DIAGNOSIS — I10 Essential (primary) hypertension: Secondary | ICD-10-CM | POA: Diagnosis not present

## 2019-04-17 DIAGNOSIS — M109 Gout, unspecified: Secondary | ICD-10-CM | POA: Diagnosis not present

## 2019-04-17 DIAGNOSIS — R1032 Left lower quadrant pain: Secondary | ICD-10-CM | POA: Diagnosis not present

## 2019-04-17 DIAGNOSIS — K219 Gastro-esophageal reflux disease without esophagitis: Secondary | ICD-10-CM | POA: Diagnosis not present

## 2019-04-17 DIAGNOSIS — Z03818 Encounter for observation for suspected exposure to other biological agents ruled out: Secondary | ICD-10-CM | POA: Diagnosis not present

## 2019-04-17 DIAGNOSIS — Z9049 Acquired absence of other specified parts of digestive tract: Secondary | ICD-10-CM | POA: Diagnosis not present

## 2019-04-17 DIAGNOSIS — N186 End stage renal disease: Secondary | ICD-10-CM | POA: Diagnosis not present

## 2019-04-17 DIAGNOSIS — G2581 Restless legs syndrome: Secondary | ICD-10-CM | POA: Diagnosis not present

## 2019-04-17 DIAGNOSIS — I120 Hypertensive chronic kidney disease with stage 5 chronic kidney disease or end stage renal disease: Secondary | ICD-10-CM | POA: Diagnosis not present

## 2019-04-18 DIAGNOSIS — K5732 Diverticulitis of large intestine without perforation or abscess without bleeding: Secondary | ICD-10-CM | POA: Diagnosis not present

## 2019-04-18 DIAGNOSIS — R109 Unspecified abdominal pain: Secondary | ICD-10-CM | POA: Diagnosis not present

## 2019-04-18 DIAGNOSIS — D631 Anemia in chronic kidney disease: Secondary | ICD-10-CM | POA: Diagnosis present

## 2019-04-18 DIAGNOSIS — I313 Pericardial effusion (noninflammatory): Secondary | ICD-10-CM | POA: Diagnosis not present

## 2019-04-18 DIAGNOSIS — N186 End stage renal disease: Secondary | ICD-10-CM | POA: Diagnosis present

## 2019-04-18 DIAGNOSIS — K760 Fatty (change of) liver, not elsewhere classified: Secondary | ICD-10-CM | POA: Diagnosis not present

## 2019-04-18 DIAGNOSIS — A419 Sepsis, unspecified organism: Secondary | ICD-10-CM | POA: Diagnosis not present

## 2019-04-18 DIAGNOSIS — I10 Essential (primary) hypertension: Secondary | ICD-10-CM | POA: Diagnosis not present

## 2019-04-18 DIAGNOSIS — R9431 Abnormal electrocardiogram [ECG] [EKG]: Secondary | ICD-10-CM | POA: Diagnosis not present

## 2019-04-18 DIAGNOSIS — D72829 Elevated white blood cell count, unspecified: Secondary | ICD-10-CM | POA: Diagnosis not present

## 2019-04-18 DIAGNOSIS — N854 Malposition of uterus: Secondary | ICD-10-CM | POA: Diagnosis not present

## 2019-04-18 DIAGNOSIS — M1990 Unspecified osteoarthritis, unspecified site: Secondary | ICD-10-CM | POA: Diagnosis present

## 2019-04-18 DIAGNOSIS — K5780 Diverticulitis of intestine, part unspecified, with perforation and abscess without bleeding: Secondary | ICD-10-CM | POA: Diagnosis not present

## 2019-04-18 DIAGNOSIS — Z452 Encounter for adjustment and management of vascular access device: Secondary | ICD-10-CM | POA: Diagnosis not present

## 2019-04-18 DIAGNOSIS — K651 Peritoneal abscess: Secondary | ICD-10-CM | POA: Diagnosis not present

## 2019-04-18 DIAGNOSIS — E875 Hyperkalemia: Secondary | ICD-10-CM | POA: Diagnosis not present

## 2019-04-18 DIAGNOSIS — K5720 Diverticulitis of large intestine with perforation and abscess without bleeding: Secondary | ICD-10-CM | POA: Diagnosis present

## 2019-04-18 DIAGNOSIS — Z01818 Encounter for other preprocedural examination: Secondary | ICD-10-CM | POA: Diagnosis not present

## 2019-04-18 DIAGNOSIS — D649 Anemia, unspecified: Secondary | ICD-10-CM | POA: Diagnosis not present

## 2019-04-18 DIAGNOSIS — I700 Atherosclerosis of aorta: Secondary | ICD-10-CM | POA: Diagnosis not present

## 2019-04-18 DIAGNOSIS — R05 Cough: Secondary | ICD-10-CM | POA: Diagnosis not present

## 2019-04-18 DIAGNOSIS — I120 Hypertensive chronic kidney disease with stage 5 chronic kidney disease or end stage renal disease: Secondary | ICD-10-CM | POA: Diagnosis present

## 2019-04-18 DIAGNOSIS — M109 Gout, unspecified: Secondary | ICD-10-CM | POA: Diagnosis present

## 2019-04-18 DIAGNOSIS — Z992 Dependence on renal dialysis: Secondary | ICD-10-CM | POA: Diagnosis not present

## 2019-04-18 DIAGNOSIS — N261 Atrophy of kidney (terminal): Secondary | ICD-10-CM | POA: Diagnosis not present

## 2019-04-18 DIAGNOSIS — R1032 Left lower quadrant pain: Secondary | ICD-10-CM | POA: Diagnosis not present

## 2019-04-18 DIAGNOSIS — G2581 Restless legs syndrome: Secondary | ICD-10-CM | POA: Diagnosis present

## 2019-04-18 DIAGNOSIS — K219 Gastro-esophageal reflux disease without esophagitis: Secondary | ICD-10-CM | POA: Diagnosis not present

## 2019-04-18 DIAGNOSIS — Z86711 Personal history of pulmonary embolism: Secondary | ICD-10-CM | POA: Insufficient documentation

## 2019-04-18 DIAGNOSIS — Z9049 Acquired absence of other specified parts of digestive tract: Secondary | ICD-10-CM | POA: Diagnosis not present

## 2019-04-18 DIAGNOSIS — Z20828 Contact with and (suspected) exposure to other viral communicable diseases: Secondary | ICD-10-CM | POA: Diagnosis present

## 2019-04-18 DIAGNOSIS — N19 Unspecified kidney failure: Secondary | ICD-10-CM | POA: Diagnosis not present

## 2019-05-10 DIAGNOSIS — Z992 Dependence on renal dialysis: Secondary | ICD-10-CM | POA: Diagnosis not present

## 2019-05-10 DIAGNOSIS — N2581 Secondary hyperparathyroidism of renal origin: Secondary | ICD-10-CM | POA: Diagnosis not present

## 2019-05-10 DIAGNOSIS — K5780 Diverticulitis of intestine, part unspecified, with perforation and abscess without bleeding: Secondary | ICD-10-CM | POA: Diagnosis not present

## 2019-05-10 DIAGNOSIS — D631 Anemia in chronic kidney disease: Secondary | ICD-10-CM | POA: Diagnosis not present

## 2019-05-10 DIAGNOSIS — B9689 Other specified bacterial agents as the cause of diseases classified elsewhere: Secondary | ICD-10-CM | POA: Diagnosis not present

## 2019-05-10 DIAGNOSIS — D509 Iron deficiency anemia, unspecified: Secondary | ICD-10-CM | POA: Diagnosis not present

## 2019-05-10 DIAGNOSIS — N186 End stage renal disease: Secondary | ICD-10-CM | POA: Diagnosis not present

## 2019-05-10 DIAGNOSIS — Z79899 Other long term (current) drug therapy: Secondary | ICD-10-CM | POA: Diagnosis not present

## 2019-05-12 DIAGNOSIS — N186 End stage renal disease: Secondary | ICD-10-CM | POA: Diagnosis not present

## 2019-05-12 DIAGNOSIS — K5780 Diverticulitis of intestine, part unspecified, with perforation and abscess without bleeding: Secondary | ICD-10-CM | POA: Diagnosis not present

## 2019-05-12 DIAGNOSIS — Z992 Dependence on renal dialysis: Secondary | ICD-10-CM | POA: Diagnosis not present

## 2019-05-12 DIAGNOSIS — N2581 Secondary hyperparathyroidism of renal origin: Secondary | ICD-10-CM | POA: Diagnosis not present

## 2019-05-12 DIAGNOSIS — D631 Anemia in chronic kidney disease: Secondary | ICD-10-CM | POA: Diagnosis not present

## 2019-05-12 DIAGNOSIS — D509 Iron deficiency anemia, unspecified: Secondary | ICD-10-CM | POA: Diagnosis not present

## 2019-05-14 DIAGNOSIS — N186 End stage renal disease: Secondary | ICD-10-CM | POA: Diagnosis not present

## 2019-05-14 DIAGNOSIS — Z992 Dependence on renal dialysis: Secondary | ICD-10-CM | POA: Diagnosis not present

## 2019-05-14 DIAGNOSIS — D509 Iron deficiency anemia, unspecified: Secondary | ICD-10-CM | POA: Diagnosis not present

## 2019-05-14 DIAGNOSIS — D631 Anemia in chronic kidney disease: Secondary | ICD-10-CM | POA: Diagnosis not present

## 2019-05-14 DIAGNOSIS — K5780 Diverticulitis of intestine, part unspecified, with perforation and abscess without bleeding: Secondary | ICD-10-CM | POA: Diagnosis not present

## 2019-05-14 DIAGNOSIS — N2581 Secondary hyperparathyroidism of renal origin: Secondary | ICD-10-CM | POA: Diagnosis not present

## 2019-05-17 DIAGNOSIS — D631 Anemia in chronic kidney disease: Secondary | ICD-10-CM | POA: Diagnosis not present

## 2019-05-17 DIAGNOSIS — D509 Iron deficiency anemia, unspecified: Secondary | ICD-10-CM | POA: Diagnosis not present

## 2019-05-17 DIAGNOSIS — Z992 Dependence on renal dialysis: Secondary | ICD-10-CM | POA: Diagnosis not present

## 2019-05-17 DIAGNOSIS — K5780 Diverticulitis of intestine, part unspecified, with perforation and abscess without bleeding: Secondary | ICD-10-CM | POA: Diagnosis not present

## 2019-05-17 DIAGNOSIS — N186 End stage renal disease: Secondary | ICD-10-CM | POA: Diagnosis not present

## 2019-05-17 DIAGNOSIS — N2581 Secondary hyperparathyroidism of renal origin: Secondary | ICD-10-CM | POA: Diagnosis not present

## 2019-05-19 DIAGNOSIS — D631 Anemia in chronic kidney disease: Secondary | ICD-10-CM | POA: Diagnosis not present

## 2019-05-19 DIAGNOSIS — D509 Iron deficiency anemia, unspecified: Secondary | ICD-10-CM | POA: Diagnosis not present

## 2019-05-19 DIAGNOSIS — N186 End stage renal disease: Secondary | ICD-10-CM | POA: Diagnosis not present

## 2019-05-19 DIAGNOSIS — K5780 Diverticulitis of intestine, part unspecified, with perforation and abscess without bleeding: Secondary | ICD-10-CM | POA: Diagnosis not present

## 2019-05-19 DIAGNOSIS — N2581 Secondary hyperparathyroidism of renal origin: Secondary | ICD-10-CM | POA: Diagnosis not present

## 2019-05-19 DIAGNOSIS — Z992 Dependence on renal dialysis: Secondary | ICD-10-CM | POA: Diagnosis not present

## 2019-05-20 DIAGNOSIS — N186 End stage renal disease: Secondary | ICD-10-CM | POA: Diagnosis not present

## 2019-05-20 DIAGNOSIS — K5780 Diverticulitis of intestine, part unspecified, with perforation and abscess without bleeding: Secondary | ICD-10-CM | POA: Diagnosis not present

## 2019-05-20 DIAGNOSIS — N261 Atrophy of kidney (terminal): Secondary | ICD-10-CM | POA: Diagnosis not present

## 2019-05-20 DIAGNOSIS — K5730 Diverticulosis of large intestine without perforation or abscess without bleeding: Secondary | ICD-10-CM | POA: Diagnosis not present

## 2019-05-21 DIAGNOSIS — Z992 Dependence on renal dialysis: Secondary | ICD-10-CM | POA: Diagnosis not present

## 2019-05-21 DIAGNOSIS — D631 Anemia in chronic kidney disease: Secondary | ICD-10-CM | POA: Diagnosis not present

## 2019-05-21 DIAGNOSIS — N2581 Secondary hyperparathyroidism of renal origin: Secondary | ICD-10-CM | POA: Diagnosis not present

## 2019-05-21 DIAGNOSIS — N186 End stage renal disease: Secondary | ICD-10-CM | POA: Diagnosis not present

## 2019-05-21 DIAGNOSIS — K5780 Diverticulitis of intestine, part unspecified, with perforation and abscess without bleeding: Secondary | ICD-10-CM | POA: Diagnosis not present

## 2019-05-21 DIAGNOSIS — D509 Iron deficiency anemia, unspecified: Secondary | ICD-10-CM | POA: Diagnosis not present

## 2019-05-24 DIAGNOSIS — K5720 Diverticulitis of large intestine with perforation and abscess without bleeding: Secondary | ICD-10-CM | POA: Diagnosis not present

## 2019-05-28 DIAGNOSIS — N186 End stage renal disease: Secondary | ICD-10-CM | POA: Diagnosis not present

## 2019-05-28 DIAGNOSIS — D509 Iron deficiency anemia, unspecified: Secondary | ICD-10-CM | POA: Diagnosis not present

## 2019-05-28 DIAGNOSIS — N2581 Secondary hyperparathyroidism of renal origin: Secondary | ICD-10-CM | POA: Diagnosis not present

## 2019-05-28 DIAGNOSIS — K5780 Diverticulitis of intestine, part unspecified, with perforation and abscess without bleeding: Secondary | ICD-10-CM | POA: Diagnosis not present

## 2019-05-28 DIAGNOSIS — Z992 Dependence on renal dialysis: Secondary | ICD-10-CM | POA: Diagnosis not present

## 2019-05-28 DIAGNOSIS — D631 Anemia in chronic kidney disease: Secondary | ICD-10-CM | POA: Diagnosis not present

## 2019-05-30 DIAGNOSIS — Z5321 Procedure and treatment not carried out due to patient leaving prior to being seen by health care provider: Secondary | ICD-10-CM | POA: Diagnosis not present

## 2019-05-30 DIAGNOSIS — R42 Dizziness and giddiness: Secondary | ICD-10-CM | POA: Diagnosis not present

## 2019-05-30 DIAGNOSIS — R079 Chest pain, unspecified: Secondary | ICD-10-CM | POA: Diagnosis not present

## 2019-05-31 DIAGNOSIS — K219 Gastro-esophageal reflux disease without esophagitis: Secondary | ICD-10-CM | POA: Diagnosis not present

## 2019-05-31 DIAGNOSIS — R079 Chest pain, unspecified: Secondary | ICD-10-CM | POA: Diagnosis not present

## 2019-05-31 DIAGNOSIS — R071 Chest pain on breathing: Secondary | ICD-10-CM | POA: Diagnosis not present

## 2019-05-31 DIAGNOSIS — E8770 Fluid overload, unspecified: Secondary | ICD-10-CM | POA: Diagnosis not present

## 2019-05-31 DIAGNOSIS — R0689 Other abnormalities of breathing: Secondary | ICD-10-CM | POA: Diagnosis not present

## 2019-05-31 DIAGNOSIS — K5730 Diverticulosis of large intestine without perforation or abscess without bleeding: Secondary | ICD-10-CM | POA: Diagnosis not present

## 2019-05-31 DIAGNOSIS — K760 Fatty (change of) liver, not elsewhere classified: Secondary | ICD-10-CM | POA: Diagnosis not present

## 2019-05-31 DIAGNOSIS — Z86711 Personal history of pulmonary embolism: Secondary | ICD-10-CM | POA: Diagnosis not present

## 2019-05-31 DIAGNOSIS — G2581 Restless legs syndrome: Secondary | ICD-10-CM | POA: Diagnosis not present

## 2019-05-31 DIAGNOSIS — I120 Hypertensive chronic kidney disease with stage 5 chronic kidney disease or end stage renal disease: Secondary | ICD-10-CM | POA: Diagnosis not present

## 2019-05-31 DIAGNOSIS — N186 End stage renal disease: Secondary | ICD-10-CM | POA: Diagnosis not present

## 2019-05-31 DIAGNOSIS — I10 Essential (primary) hypertension: Secondary | ICD-10-CM | POA: Diagnosis not present

## 2019-05-31 DIAGNOSIS — R0682 Tachypnea, not elsewhere classified: Secondary | ICD-10-CM | POA: Diagnosis not present

## 2019-05-31 DIAGNOSIS — Z03818 Encounter for observation for suspected exposure to other biological agents ruled out: Secondary | ICD-10-CM | POA: Diagnosis not present

## 2019-05-31 DIAGNOSIS — R9431 Abnormal electrocardiogram [ECG] [EKG]: Secondary | ICD-10-CM | POA: Diagnosis not present

## 2019-05-31 DIAGNOSIS — Z992 Dependence on renal dialysis: Secondary | ICD-10-CM | POA: Diagnosis not present

## 2019-05-31 DIAGNOSIS — Z79899 Other long term (current) drug therapy: Secondary | ICD-10-CM | POA: Diagnosis not present

## 2019-05-31 DIAGNOSIS — R0602 Shortness of breath: Secondary | ICD-10-CM | POA: Diagnosis not present

## 2019-05-31 DIAGNOSIS — E875 Hyperkalemia: Secondary | ICD-10-CM | POA: Diagnosis not present

## 2019-05-31 DIAGNOSIS — Z20828 Contact with and (suspected) exposure to other viral communicable diseases: Secondary | ICD-10-CM | POA: Diagnosis not present

## 2019-05-31 DIAGNOSIS — I517 Cardiomegaly: Secondary | ICD-10-CM | POA: Diagnosis not present

## 2019-05-31 DIAGNOSIS — Z6841 Body Mass Index (BMI) 40.0 and over, adult: Secondary | ICD-10-CM | POA: Diagnosis not present

## 2019-05-31 DIAGNOSIS — R0789 Other chest pain: Secondary | ICD-10-CM | POA: Diagnosis not present

## 2019-06-01 DIAGNOSIS — R071 Chest pain on breathing: Secondary | ICD-10-CM | POA: Diagnosis not present

## 2019-06-02 DIAGNOSIS — B9689 Other specified bacterial agents as the cause of diseases classified elsewhere: Secondary | ICD-10-CM | POA: Diagnosis not present

## 2019-06-02 DIAGNOSIS — D631 Anemia in chronic kidney disease: Secondary | ICD-10-CM | POA: Diagnosis not present

## 2019-06-02 DIAGNOSIS — D509 Iron deficiency anemia, unspecified: Secondary | ICD-10-CM | POA: Diagnosis not present

## 2019-06-02 DIAGNOSIS — R110 Nausea: Secondary | ICD-10-CM | POA: Diagnosis not present

## 2019-06-02 DIAGNOSIS — K5780 Diverticulitis of intestine, part unspecified, with perforation and abscess without bleeding: Secondary | ICD-10-CM | POA: Diagnosis not present

## 2019-06-02 DIAGNOSIS — N186 End stage renal disease: Secondary | ICD-10-CM | POA: Diagnosis not present

## 2019-06-02 DIAGNOSIS — Z992 Dependence on renal dialysis: Secondary | ICD-10-CM | POA: Diagnosis not present

## 2019-06-04 DIAGNOSIS — Z992 Dependence on renal dialysis: Secondary | ICD-10-CM | POA: Diagnosis not present

## 2019-06-04 DIAGNOSIS — N186 End stage renal disease: Secondary | ICD-10-CM | POA: Diagnosis not present

## 2019-06-04 DIAGNOSIS — D509 Iron deficiency anemia, unspecified: Secondary | ICD-10-CM | POA: Diagnosis not present

## 2019-06-04 DIAGNOSIS — K5780 Diverticulitis of intestine, part unspecified, with perforation and abscess without bleeding: Secondary | ICD-10-CM | POA: Diagnosis not present

## 2019-06-04 DIAGNOSIS — R110 Nausea: Secondary | ICD-10-CM | POA: Diagnosis not present

## 2019-06-04 DIAGNOSIS — D631 Anemia in chronic kidney disease: Secondary | ICD-10-CM | POA: Diagnosis not present

## 2019-06-05 DIAGNOSIS — R110 Nausea: Secondary | ICD-10-CM | POA: Diagnosis not present

## 2019-06-05 DIAGNOSIS — D631 Anemia in chronic kidney disease: Secondary | ICD-10-CM | POA: Diagnosis not present

## 2019-06-05 DIAGNOSIS — Z992 Dependence on renal dialysis: Secondary | ICD-10-CM | POA: Diagnosis not present

## 2019-06-05 DIAGNOSIS — K5780 Diverticulitis of intestine, part unspecified, with perforation and abscess without bleeding: Secondary | ICD-10-CM | POA: Diagnosis not present

## 2019-06-05 DIAGNOSIS — D509 Iron deficiency anemia, unspecified: Secondary | ICD-10-CM | POA: Diagnosis not present

## 2019-06-05 DIAGNOSIS — N186 End stage renal disease: Secondary | ICD-10-CM | POA: Diagnosis not present

## 2019-06-06 DIAGNOSIS — Z6841 Body Mass Index (BMI) 40.0 and over, adult: Secondary | ICD-10-CM | POA: Diagnosis not present

## 2019-06-06 DIAGNOSIS — Z5321 Procedure and treatment not carried out due to patient leaving prior to being seen by health care provider: Secondary | ICD-10-CM | POA: Diagnosis not present

## 2019-06-06 DIAGNOSIS — R0789 Other chest pain: Secondary | ICD-10-CM | POA: Diagnosis not present

## 2019-06-06 DIAGNOSIS — Z7689 Persons encountering health services in other specified circumstances: Secondary | ICD-10-CM | POA: Diagnosis not present

## 2019-06-06 DIAGNOSIS — Z95828 Presence of other vascular implants and grafts: Secondary | ICD-10-CM | POA: Diagnosis not present

## 2019-06-06 DIAGNOSIS — R079 Chest pain, unspecified: Secondary | ICD-10-CM | POA: Diagnosis not present

## 2019-06-07 DIAGNOSIS — R110 Nausea: Secondary | ICD-10-CM | POA: Diagnosis not present

## 2019-06-07 DIAGNOSIS — Z992 Dependence on renal dialysis: Secondary | ICD-10-CM | POA: Diagnosis not present

## 2019-06-07 DIAGNOSIS — N186 End stage renal disease: Secondary | ICD-10-CM | POA: Diagnosis not present

## 2019-06-07 DIAGNOSIS — D509 Iron deficiency anemia, unspecified: Secondary | ICD-10-CM | POA: Diagnosis not present

## 2019-06-07 DIAGNOSIS — D631 Anemia in chronic kidney disease: Secondary | ICD-10-CM | POA: Diagnosis not present

## 2019-06-07 DIAGNOSIS — K5780 Diverticulitis of intestine, part unspecified, with perforation and abscess without bleeding: Secondary | ICD-10-CM | POA: Diagnosis not present

## 2019-06-09 DIAGNOSIS — N186 End stage renal disease: Secondary | ICD-10-CM | POA: Diagnosis not present

## 2019-06-09 DIAGNOSIS — R110 Nausea: Secondary | ICD-10-CM | POA: Diagnosis not present

## 2019-06-09 DIAGNOSIS — Z992 Dependence on renal dialysis: Secondary | ICD-10-CM | POA: Diagnosis not present

## 2019-06-09 DIAGNOSIS — D509 Iron deficiency anemia, unspecified: Secondary | ICD-10-CM | POA: Diagnosis not present

## 2019-06-09 DIAGNOSIS — K5780 Diverticulitis of intestine, part unspecified, with perforation and abscess without bleeding: Secondary | ICD-10-CM | POA: Diagnosis not present

## 2019-06-09 DIAGNOSIS — D631 Anemia in chronic kidney disease: Secondary | ICD-10-CM | POA: Diagnosis not present

## 2019-06-13 DIAGNOSIS — K5720 Diverticulitis of large intestine with perforation and abscess without bleeding: Secondary | ICD-10-CM | POA: Diagnosis not present

## 2019-06-13 DIAGNOSIS — D631 Anemia in chronic kidney disease: Secondary | ICD-10-CM | POA: Diagnosis not present

## 2019-06-13 DIAGNOSIS — I120 Hypertensive chronic kidney disease with stage 5 chronic kidney disease or end stage renal disease: Secondary | ICD-10-CM | POA: Diagnosis not present

## 2019-06-13 DIAGNOSIS — N186 End stage renal disease: Secondary | ICD-10-CM | POA: Diagnosis not present

## 2019-06-13 DIAGNOSIS — K630 Abscess of intestine: Secondary | ICD-10-CM | POA: Diagnosis not present

## 2019-06-13 DIAGNOSIS — Z6841 Body Mass Index (BMI) 40.0 and over, adult: Secondary | ICD-10-CM | POA: Diagnosis not present

## 2019-06-13 DIAGNOSIS — K2981 Duodenitis with bleeding: Secondary | ICD-10-CM | POA: Diagnosis not present

## 2019-06-14 DIAGNOSIS — I120 Hypertensive chronic kidney disease with stage 5 chronic kidney disease or end stage renal disease: Secondary | ICD-10-CM | POA: Diagnosis present

## 2019-06-14 DIAGNOSIS — Z992 Dependence on renal dialysis: Secondary | ICD-10-CM | POA: Diagnosis not present

## 2019-06-14 DIAGNOSIS — Z6841 Body Mass Index (BMI) 40.0 and over, adult: Secondary | ICD-10-CM | POA: Diagnosis not present

## 2019-06-14 DIAGNOSIS — G2581 Restless legs syndrome: Secondary | ICD-10-CM | POA: Diagnosis present

## 2019-06-14 DIAGNOSIS — Z86711 Personal history of pulmonary embolism: Secondary | ICD-10-CM | POA: Diagnosis not present

## 2019-06-14 DIAGNOSIS — K2981 Duodenitis with bleeding: Secondary | ICD-10-CM | POA: Diagnosis present

## 2019-06-14 DIAGNOSIS — E669 Obesity, unspecified: Secondary | ICD-10-CM | POA: Diagnosis present

## 2019-06-14 DIAGNOSIS — N186 End stage renal disease: Secondary | ICD-10-CM | POA: Diagnosis present

## 2019-06-14 DIAGNOSIS — E875 Hyperkalemia: Secondary | ICD-10-CM | POA: Diagnosis present

## 2019-06-14 DIAGNOSIS — K630 Abscess of intestine: Secondary | ICD-10-CM | POA: Diagnosis not present

## 2019-06-14 DIAGNOSIS — K5720 Diverticulitis of large intestine with perforation and abscess without bleeding: Secondary | ICD-10-CM | POA: Diagnosis not present

## 2019-06-14 DIAGNOSIS — D131 Benign neoplasm of stomach: Secondary | ICD-10-CM | POA: Diagnosis present

## 2019-06-14 DIAGNOSIS — M109 Gout, unspecified: Secondary | ICD-10-CM | POA: Diagnosis present

## 2019-06-14 DIAGNOSIS — Z20828 Contact with and (suspected) exposure to other viral communicable diseases: Secondary | ICD-10-CM | POA: Diagnosis present

## 2019-06-14 DIAGNOSIS — K219 Gastro-esophageal reflux disease without esophagitis: Secondary | ICD-10-CM | POA: Diagnosis present

## 2019-06-14 DIAGNOSIS — D631 Anemia in chronic kidney disease: Secondary | ICD-10-CM | POA: Diagnosis present

## 2019-06-15 DIAGNOSIS — K5720 Diverticulitis of large intestine with perforation and abscess without bleeding: Secondary | ICD-10-CM | POA: Diagnosis not present

## 2019-06-16 DIAGNOSIS — K5720 Diverticulitis of large intestine with perforation and abscess without bleeding: Secondary | ICD-10-CM | POA: Diagnosis not present

## 2019-06-18 DIAGNOSIS — D631 Anemia in chronic kidney disease: Secondary | ICD-10-CM | POA: Diagnosis not present

## 2019-06-18 DIAGNOSIS — K5780 Diverticulitis of intestine, part unspecified, with perforation and abscess without bleeding: Secondary | ICD-10-CM | POA: Diagnosis not present

## 2019-06-18 DIAGNOSIS — R110 Nausea: Secondary | ICD-10-CM | POA: Diagnosis not present

## 2019-06-18 DIAGNOSIS — D509 Iron deficiency anemia, unspecified: Secondary | ICD-10-CM | POA: Diagnosis not present

## 2019-06-18 DIAGNOSIS — Z992 Dependence on renal dialysis: Secondary | ICD-10-CM | POA: Diagnosis not present

## 2019-06-18 DIAGNOSIS — N186 End stage renal disease: Secondary | ICD-10-CM | POA: Diagnosis not present

## 2019-06-21 DIAGNOSIS — Z992 Dependence on renal dialysis: Secondary | ICD-10-CM | POA: Diagnosis not present

## 2019-06-21 DIAGNOSIS — R110 Nausea: Secondary | ICD-10-CM | POA: Diagnosis not present

## 2019-06-21 DIAGNOSIS — D631 Anemia in chronic kidney disease: Secondary | ICD-10-CM | POA: Diagnosis not present

## 2019-06-21 DIAGNOSIS — D509 Iron deficiency anemia, unspecified: Secondary | ICD-10-CM | POA: Diagnosis not present

## 2019-06-21 DIAGNOSIS — N186 End stage renal disease: Secondary | ICD-10-CM | POA: Diagnosis not present

## 2019-06-21 DIAGNOSIS — K5780 Diverticulitis of intestine, part unspecified, with perforation and abscess without bleeding: Secondary | ICD-10-CM | POA: Diagnosis not present

## 2019-07-01 DIAGNOSIS — N2581 Secondary hyperparathyroidism of renal origin: Secondary | ICD-10-CM | POA: Diagnosis not present

## 2019-07-01 DIAGNOSIS — N186 End stage renal disease: Secondary | ICD-10-CM | POA: Diagnosis not present

## 2019-07-01 DIAGNOSIS — E8779 Other fluid overload: Secondary | ICD-10-CM | POA: Diagnosis not present

## 2019-07-01 DIAGNOSIS — D509 Iron deficiency anemia, unspecified: Secondary | ICD-10-CM | POA: Diagnosis not present

## 2019-07-01 DIAGNOSIS — Z992 Dependence on renal dialysis: Secondary | ICD-10-CM | POA: Diagnosis not present

## 2019-07-01 DIAGNOSIS — D631 Anemia in chronic kidney disease: Secondary | ICD-10-CM | POA: Diagnosis not present

## 2019-07-01 DIAGNOSIS — R110 Nausea: Secondary | ICD-10-CM | POA: Diagnosis not present

## 2019-07-02 DIAGNOSIS — N2581 Secondary hyperparathyroidism of renal origin: Secondary | ICD-10-CM | POA: Diagnosis not present

## 2019-07-02 DIAGNOSIS — N186 End stage renal disease: Secondary | ICD-10-CM | POA: Diagnosis not present

## 2019-07-02 DIAGNOSIS — R110 Nausea: Secondary | ICD-10-CM | POA: Diagnosis not present

## 2019-07-02 DIAGNOSIS — D631 Anemia in chronic kidney disease: Secondary | ICD-10-CM | POA: Diagnosis not present

## 2019-07-02 DIAGNOSIS — D509 Iron deficiency anemia, unspecified: Secondary | ICD-10-CM | POA: Diagnosis not present

## 2019-07-02 DIAGNOSIS — Z992 Dependence on renal dialysis: Secondary | ICD-10-CM | POA: Diagnosis not present

## 2019-07-05 DIAGNOSIS — N2581 Secondary hyperparathyroidism of renal origin: Secondary | ICD-10-CM | POA: Diagnosis not present

## 2019-07-05 DIAGNOSIS — I129 Hypertensive chronic kidney disease with stage 1 through stage 4 chronic kidney disease, or unspecified chronic kidney disease: Secondary | ICD-10-CM | POA: Diagnosis present

## 2019-07-05 DIAGNOSIS — J90 Pleural effusion, not elsewhere classified: Secondary | ICD-10-CM | POA: Diagnosis present

## 2019-07-05 DIAGNOSIS — K630 Abscess of intestine: Secondary | ICD-10-CM | POA: Diagnosis present

## 2019-07-05 DIAGNOSIS — N186 End stage renal disease: Secondary | ICD-10-CM | POA: Diagnosis not present

## 2019-07-05 DIAGNOSIS — R1032 Left lower quadrant pain: Secondary | ICD-10-CM | POA: Diagnosis not present

## 2019-07-05 DIAGNOSIS — R110 Nausea: Secondary | ICD-10-CM | POA: Diagnosis not present

## 2019-07-05 DIAGNOSIS — K5720 Diverticulitis of large intestine with perforation and abscess without bleeding: Secondary | ICD-10-CM | POA: Diagnosis not present

## 2019-07-05 DIAGNOSIS — K2980 Duodenitis without bleeding: Secondary | ICD-10-CM | POA: Diagnosis present

## 2019-07-05 DIAGNOSIS — Z03818 Encounter for observation for suspected exposure to other biological agents ruled out: Secondary | ICD-10-CM | POA: Diagnosis not present

## 2019-07-05 DIAGNOSIS — Z20822 Contact with and (suspected) exposure to covid-19: Secondary | ICD-10-CM | POA: Diagnosis present

## 2019-07-05 DIAGNOSIS — T82898A Other specified complication of vascular prosthetic devices, implants and grafts, initial encounter: Secondary | ICD-10-CM | POA: Diagnosis present

## 2019-07-05 DIAGNOSIS — Z992 Dependence on renal dialysis: Secondary | ICD-10-CM | POA: Diagnosis not present

## 2019-07-05 DIAGNOSIS — Z6841 Body Mass Index (BMI) 40.0 and over, adult: Secondary | ICD-10-CM | POA: Diagnosis not present

## 2019-07-05 DIAGNOSIS — Z86711 Personal history of pulmonary embolism: Secondary | ICD-10-CM | POA: Diagnosis not present

## 2019-07-05 DIAGNOSIS — D631 Anemia in chronic kidney disease: Secondary | ICD-10-CM | POA: Diagnosis not present

## 2019-07-05 DIAGNOSIS — D509 Iron deficiency anemia, unspecified: Secondary | ICD-10-CM | POA: Diagnosis not present

## 2019-07-05 DIAGNOSIS — I313 Pericardial effusion (noninflammatory): Secondary | ICD-10-CM | POA: Diagnosis present

## 2019-07-05 DIAGNOSIS — K219 Gastro-esophageal reflux disease without esophagitis: Secondary | ICD-10-CM | POA: Diagnosis present

## 2019-07-09 DIAGNOSIS — Z992 Dependence on renal dialysis: Secondary | ICD-10-CM | POA: Diagnosis not present

## 2019-07-09 DIAGNOSIS — D631 Anemia in chronic kidney disease: Secondary | ICD-10-CM | POA: Diagnosis not present

## 2019-07-09 DIAGNOSIS — R110 Nausea: Secondary | ICD-10-CM | POA: Diagnosis not present

## 2019-07-09 DIAGNOSIS — N2581 Secondary hyperparathyroidism of renal origin: Secondary | ICD-10-CM | POA: Diagnosis not present

## 2019-07-09 DIAGNOSIS — D509 Iron deficiency anemia, unspecified: Secondary | ICD-10-CM | POA: Diagnosis not present

## 2019-07-09 DIAGNOSIS — N186 End stage renal disease: Secondary | ICD-10-CM | POA: Diagnosis not present

## 2019-07-12 DIAGNOSIS — R110 Nausea: Secondary | ICD-10-CM | POA: Diagnosis not present

## 2019-07-12 DIAGNOSIS — D631 Anemia in chronic kidney disease: Secondary | ICD-10-CM | POA: Diagnosis not present

## 2019-07-12 DIAGNOSIS — D509 Iron deficiency anemia, unspecified: Secondary | ICD-10-CM | POA: Diagnosis not present

## 2019-07-12 DIAGNOSIS — Z992 Dependence on renal dialysis: Secondary | ICD-10-CM | POA: Diagnosis not present

## 2019-07-12 DIAGNOSIS — N186 End stage renal disease: Secondary | ICD-10-CM | POA: Diagnosis not present

## 2019-07-12 DIAGNOSIS — N2581 Secondary hyperparathyroidism of renal origin: Secondary | ICD-10-CM | POA: Diagnosis not present

## 2019-07-14 DIAGNOSIS — R110 Nausea: Secondary | ICD-10-CM | POA: Diagnosis not present

## 2019-07-14 DIAGNOSIS — D631 Anemia in chronic kidney disease: Secondary | ICD-10-CM | POA: Diagnosis not present

## 2019-07-14 DIAGNOSIS — N186 End stage renal disease: Secondary | ICD-10-CM | POA: Diagnosis not present

## 2019-07-14 DIAGNOSIS — Z992 Dependence on renal dialysis: Secondary | ICD-10-CM | POA: Diagnosis not present

## 2019-07-14 DIAGNOSIS — D509 Iron deficiency anemia, unspecified: Secondary | ICD-10-CM | POA: Diagnosis not present

## 2019-07-14 DIAGNOSIS — N2581 Secondary hyperparathyroidism of renal origin: Secondary | ICD-10-CM | POA: Diagnosis not present

## 2019-07-16 DIAGNOSIS — N186 End stage renal disease: Secondary | ICD-10-CM | POA: Diagnosis not present

## 2019-07-16 DIAGNOSIS — D631 Anemia in chronic kidney disease: Secondary | ICD-10-CM | POA: Diagnosis not present

## 2019-07-16 DIAGNOSIS — R110 Nausea: Secondary | ICD-10-CM | POA: Diagnosis not present

## 2019-07-16 DIAGNOSIS — N2581 Secondary hyperparathyroidism of renal origin: Secondary | ICD-10-CM | POA: Diagnosis not present

## 2019-07-16 DIAGNOSIS — D509 Iron deficiency anemia, unspecified: Secondary | ICD-10-CM | POA: Diagnosis not present

## 2019-07-16 DIAGNOSIS — Z992 Dependence on renal dialysis: Secondary | ICD-10-CM | POA: Diagnosis not present

## 2019-07-18 DIAGNOSIS — G2581 Restless legs syndrome: Secondary | ICD-10-CM | POA: Diagnosis present

## 2019-07-18 DIAGNOSIS — E875 Hyperkalemia: Secondary | ICD-10-CM | POA: Diagnosis not present

## 2019-07-18 DIAGNOSIS — K5720 Diverticulitis of large intestine with perforation and abscess without bleeding: Secondary | ICD-10-CM | POA: Diagnosis not present

## 2019-07-18 DIAGNOSIS — Z6841 Body Mass Index (BMI) 40.0 and over, adult: Secondary | ICD-10-CM | POA: Diagnosis not present

## 2019-07-18 DIAGNOSIS — M1A9XX0 Chronic gout, unspecified, without tophus (tophi): Secondary | ICD-10-CM | POA: Diagnosis present

## 2019-07-18 DIAGNOSIS — K5732 Diverticulitis of large intestine without perforation or abscess without bleeding: Secondary | ICD-10-CM | POA: Diagnosis not present

## 2019-07-18 DIAGNOSIS — Z992 Dependence on renal dialysis: Secondary | ICD-10-CM | POA: Diagnosis not present

## 2019-07-18 DIAGNOSIS — Z86711 Personal history of pulmonary embolism: Secondary | ICD-10-CM | POA: Diagnosis not present

## 2019-07-18 DIAGNOSIS — K5792 Diverticulitis of intestine, part unspecified, without perforation or abscess without bleeding: Secondary | ICD-10-CM | POA: Diagnosis not present

## 2019-07-18 DIAGNOSIS — N186 End stage renal disease: Secondary | ICD-10-CM | POA: Diagnosis not present

## 2019-07-18 DIAGNOSIS — K760 Fatty (change of) liver, not elsewhere classified: Secondary | ICD-10-CM | POA: Diagnosis not present

## 2019-07-18 DIAGNOSIS — Z9049 Acquired absence of other specified parts of digestive tract: Secondary | ICD-10-CM | POA: Diagnosis not present

## 2019-07-18 DIAGNOSIS — I10 Essential (primary) hypertension: Secondary | ICD-10-CM | POA: Diagnosis not present

## 2019-07-18 DIAGNOSIS — Z03818 Encounter for observation for suspected exposure to other biological agents ruled out: Secondary | ICD-10-CM | POA: Diagnosis not present

## 2019-07-18 DIAGNOSIS — I120 Hypertensive chronic kidney disease with stage 5 chronic kidney disease or end stage renal disease: Secondary | ICD-10-CM | POA: Diagnosis present

## 2019-07-18 DIAGNOSIS — K219 Gastro-esophageal reflux disease without esophagitis: Secondary | ICD-10-CM | POA: Diagnosis not present

## 2019-07-18 DIAGNOSIS — R1032 Left lower quadrant pain: Secondary | ICD-10-CM | POA: Diagnosis not present

## 2019-07-18 DIAGNOSIS — M1712 Unilateral primary osteoarthritis, left knee: Secondary | ICD-10-CM | POA: Diagnosis present

## 2019-07-18 DIAGNOSIS — D631 Anemia in chronic kidney disease: Secondary | ICD-10-CM | POA: Diagnosis not present

## 2019-07-18 DIAGNOSIS — Z20822 Contact with and (suspected) exposure to covid-19: Secondary | ICD-10-CM | POA: Diagnosis present

## 2019-07-23 DIAGNOSIS — N186 End stage renal disease: Secondary | ICD-10-CM | POA: Diagnosis not present

## 2019-07-23 DIAGNOSIS — D509 Iron deficiency anemia, unspecified: Secondary | ICD-10-CM | POA: Diagnosis not present

## 2019-07-23 DIAGNOSIS — R110 Nausea: Secondary | ICD-10-CM | POA: Diagnosis not present

## 2019-07-23 DIAGNOSIS — N2581 Secondary hyperparathyroidism of renal origin: Secondary | ICD-10-CM | POA: Diagnosis not present

## 2019-07-23 DIAGNOSIS — D631 Anemia in chronic kidney disease: Secondary | ICD-10-CM | POA: Diagnosis not present

## 2019-07-23 DIAGNOSIS — Z992 Dependence on renal dialysis: Secondary | ICD-10-CM | POA: Diagnosis not present

## 2019-07-25 DIAGNOSIS — K5732 Diverticulitis of large intestine without perforation or abscess without bleeding: Secondary | ICD-10-CM | POA: Diagnosis not present

## 2019-07-25 DIAGNOSIS — Z6841 Body Mass Index (BMI) 40.0 and over, adult: Secondary | ICD-10-CM | POA: Diagnosis not present

## 2019-07-26 DIAGNOSIS — R110 Nausea: Secondary | ICD-10-CM | POA: Diagnosis not present

## 2019-07-26 DIAGNOSIS — Z992 Dependence on renal dialysis: Secondary | ICD-10-CM | POA: Diagnosis not present

## 2019-07-26 DIAGNOSIS — D631 Anemia in chronic kidney disease: Secondary | ICD-10-CM | POA: Diagnosis not present

## 2019-07-26 DIAGNOSIS — N186 End stage renal disease: Secondary | ICD-10-CM | POA: Diagnosis not present

## 2019-07-26 DIAGNOSIS — N2581 Secondary hyperparathyroidism of renal origin: Secondary | ICD-10-CM | POA: Diagnosis not present

## 2019-07-26 DIAGNOSIS — D509 Iron deficiency anemia, unspecified: Secondary | ICD-10-CM | POA: Diagnosis not present

## 2019-07-28 DIAGNOSIS — N186 End stage renal disease: Secondary | ICD-10-CM | POA: Diagnosis not present

## 2019-07-28 DIAGNOSIS — D509 Iron deficiency anemia, unspecified: Secondary | ICD-10-CM | POA: Diagnosis not present

## 2019-07-28 DIAGNOSIS — D631 Anemia in chronic kidney disease: Secondary | ICD-10-CM | POA: Diagnosis not present

## 2019-07-28 DIAGNOSIS — R110 Nausea: Secondary | ICD-10-CM | POA: Diagnosis not present

## 2019-07-28 DIAGNOSIS — N2581 Secondary hyperparathyroidism of renal origin: Secondary | ICD-10-CM | POA: Diagnosis not present

## 2019-07-28 DIAGNOSIS — Z992 Dependence on renal dialysis: Secondary | ICD-10-CM | POA: Diagnosis not present

## 2019-07-29 DIAGNOSIS — Z01818 Encounter for other preprocedural examination: Secondary | ICD-10-CM | POA: Diagnosis not present

## 2019-07-29 DIAGNOSIS — Z992 Dependence on renal dialysis: Secondary | ICD-10-CM | POA: Diagnosis not present

## 2019-07-29 DIAGNOSIS — D509 Iron deficiency anemia, unspecified: Secondary | ICD-10-CM | POA: Diagnosis not present

## 2019-07-29 DIAGNOSIS — Z20822 Contact with and (suspected) exposure to covid-19: Secondary | ICD-10-CM | POA: Diagnosis not present

## 2019-07-29 DIAGNOSIS — R110 Nausea: Secondary | ICD-10-CM | POA: Diagnosis not present

## 2019-07-29 DIAGNOSIS — D631 Anemia in chronic kidney disease: Secondary | ICD-10-CM | POA: Diagnosis not present

## 2019-07-29 DIAGNOSIS — N186 End stage renal disease: Secondary | ICD-10-CM | POA: Diagnosis not present

## 2019-07-29 DIAGNOSIS — N2581 Secondary hyperparathyroidism of renal origin: Secondary | ICD-10-CM | POA: Diagnosis not present

## 2019-08-01 DIAGNOSIS — N186 End stage renal disease: Secondary | ICD-10-CM | POA: Diagnosis not present

## 2019-08-01 DIAGNOSIS — D509 Iron deficiency anemia, unspecified: Secondary | ICD-10-CM | POA: Diagnosis not present

## 2019-08-01 DIAGNOSIS — N2581 Secondary hyperparathyroidism of renal origin: Secondary | ICD-10-CM | POA: Diagnosis not present

## 2019-08-01 DIAGNOSIS — D631 Anemia in chronic kidney disease: Secondary | ICD-10-CM | POA: Diagnosis not present

## 2019-08-01 DIAGNOSIS — Z79899 Other long term (current) drug therapy: Secondary | ICD-10-CM | POA: Diagnosis not present

## 2019-08-01 DIAGNOSIS — I120 Hypertensive chronic kidney disease with stage 5 chronic kidney disease or end stage renal disease: Secondary | ICD-10-CM | POA: Diagnosis not present

## 2019-08-01 DIAGNOSIS — R9431 Abnormal electrocardiogram [ECG] [EKG]: Secondary | ICD-10-CM | POA: Diagnosis not present

## 2019-08-01 DIAGNOSIS — E875 Hyperkalemia: Secondary | ICD-10-CM | POA: Diagnosis not present

## 2019-08-01 DIAGNOSIS — Z5309 Procedure and treatment not carried out because of other contraindication: Secondary | ICD-10-CM | POA: Diagnosis not present

## 2019-08-01 DIAGNOSIS — Z992 Dependence on renal dialysis: Secondary | ICD-10-CM | POA: Diagnosis not present

## 2019-08-02 DIAGNOSIS — D631 Anemia in chronic kidney disease: Secondary | ICD-10-CM | POA: Diagnosis not present

## 2019-08-02 DIAGNOSIS — N2581 Secondary hyperparathyroidism of renal origin: Secondary | ICD-10-CM | POA: Diagnosis not present

## 2019-08-02 DIAGNOSIS — Z992 Dependence on renal dialysis: Secondary | ICD-10-CM | POA: Diagnosis not present

## 2019-08-02 DIAGNOSIS — D509 Iron deficiency anemia, unspecified: Secondary | ICD-10-CM | POA: Diagnosis not present

## 2019-08-02 DIAGNOSIS — N186 End stage renal disease: Secondary | ICD-10-CM | POA: Diagnosis not present

## 2019-08-04 DIAGNOSIS — N186 End stage renal disease: Secondary | ICD-10-CM | POA: Diagnosis not present

## 2019-08-04 DIAGNOSIS — N2581 Secondary hyperparathyroidism of renal origin: Secondary | ICD-10-CM | POA: Diagnosis not present

## 2019-08-04 DIAGNOSIS — D631 Anemia in chronic kidney disease: Secondary | ICD-10-CM | POA: Diagnosis not present

## 2019-08-04 DIAGNOSIS — D509 Iron deficiency anemia, unspecified: Secondary | ICD-10-CM | POA: Diagnosis not present

## 2019-08-04 DIAGNOSIS — Z992 Dependence on renal dialysis: Secondary | ICD-10-CM | POA: Diagnosis not present

## 2019-08-06 DIAGNOSIS — D509 Iron deficiency anemia, unspecified: Secondary | ICD-10-CM | POA: Diagnosis not present

## 2019-08-06 DIAGNOSIS — Z992 Dependence on renal dialysis: Secondary | ICD-10-CM | POA: Diagnosis not present

## 2019-08-06 DIAGNOSIS — N186 End stage renal disease: Secondary | ICD-10-CM | POA: Diagnosis not present

## 2019-08-06 DIAGNOSIS — D631 Anemia in chronic kidney disease: Secondary | ICD-10-CM | POA: Diagnosis not present

## 2019-08-06 DIAGNOSIS — N2581 Secondary hyperparathyroidism of renal origin: Secondary | ICD-10-CM | POA: Diagnosis not present

## 2019-08-09 DIAGNOSIS — Z992 Dependence on renal dialysis: Secondary | ICD-10-CM | POA: Diagnosis not present

## 2019-08-09 DIAGNOSIS — N186 End stage renal disease: Secondary | ICD-10-CM | POA: Diagnosis not present

## 2019-08-09 DIAGNOSIS — N2581 Secondary hyperparathyroidism of renal origin: Secondary | ICD-10-CM | POA: Diagnosis not present

## 2019-08-09 DIAGNOSIS — D509 Iron deficiency anemia, unspecified: Secondary | ICD-10-CM | POA: Diagnosis not present

## 2019-08-09 DIAGNOSIS — D631 Anemia in chronic kidney disease: Secondary | ICD-10-CM | POA: Diagnosis not present

## 2019-08-11 DIAGNOSIS — N2581 Secondary hyperparathyroidism of renal origin: Secondary | ICD-10-CM | POA: Diagnosis not present

## 2019-08-11 DIAGNOSIS — D631 Anemia in chronic kidney disease: Secondary | ICD-10-CM | POA: Diagnosis not present

## 2019-08-11 DIAGNOSIS — N186 End stage renal disease: Secondary | ICD-10-CM | POA: Diagnosis not present

## 2019-08-11 DIAGNOSIS — D509 Iron deficiency anemia, unspecified: Secondary | ICD-10-CM | POA: Diagnosis not present

## 2019-08-11 DIAGNOSIS — Z992 Dependence on renal dialysis: Secondary | ICD-10-CM | POA: Diagnosis not present

## 2019-08-13 DIAGNOSIS — Z20822 Contact with and (suspected) exposure to covid-19: Secondary | ICD-10-CM | POA: Diagnosis not present

## 2019-08-13 DIAGNOSIS — D631 Anemia in chronic kidney disease: Secondary | ICD-10-CM | POA: Diagnosis not present

## 2019-08-13 DIAGNOSIS — Z992 Dependence on renal dialysis: Secondary | ICD-10-CM | POA: Diagnosis not present

## 2019-08-13 DIAGNOSIS — Z01812 Encounter for preprocedural laboratory examination: Secondary | ICD-10-CM | POA: Diagnosis not present

## 2019-08-13 DIAGNOSIS — N2581 Secondary hyperparathyroidism of renal origin: Secondary | ICD-10-CM | POA: Diagnosis not present

## 2019-08-13 DIAGNOSIS — N186 End stage renal disease: Secondary | ICD-10-CM | POA: Diagnosis not present

## 2019-08-13 DIAGNOSIS — D509 Iron deficiency anemia, unspecified: Secondary | ICD-10-CM | POA: Diagnosis not present

## 2019-08-15 DIAGNOSIS — N2581 Secondary hyperparathyroidism of renal origin: Secondary | ICD-10-CM | POA: Diagnosis not present

## 2019-08-15 DIAGNOSIS — D509 Iron deficiency anemia, unspecified: Secondary | ICD-10-CM | POA: Diagnosis not present

## 2019-08-15 DIAGNOSIS — D631 Anemia in chronic kidney disease: Secondary | ICD-10-CM | POA: Diagnosis not present

## 2019-08-15 DIAGNOSIS — N186 End stage renal disease: Secondary | ICD-10-CM | POA: Diagnosis not present

## 2019-08-15 DIAGNOSIS — Z992 Dependence on renal dialysis: Secondary | ICD-10-CM | POA: Diagnosis not present

## 2019-08-16 DIAGNOSIS — K219 Gastro-esophageal reflux disease without esophagitis: Secondary | ICD-10-CM | POA: Diagnosis not present

## 2019-08-16 DIAGNOSIS — Z20822 Contact with and (suspected) exposure to covid-19: Secondary | ICD-10-CM | POA: Diagnosis not present

## 2019-08-16 DIAGNOSIS — Z6841 Body Mass Index (BMI) 40.0 and over, adult: Secondary | ICD-10-CM | POA: Diagnosis not present

## 2019-08-16 DIAGNOSIS — I120 Hypertensive chronic kidney disease with stage 5 chronic kidney disease or end stage renal disease: Secondary | ICD-10-CM | POA: Diagnosis not present

## 2019-08-16 DIAGNOSIS — Z79899 Other long term (current) drug therapy: Secondary | ICD-10-CM | POA: Diagnosis not present

## 2019-08-16 DIAGNOSIS — Z992 Dependence on renal dialysis: Secondary | ICD-10-CM | POA: Diagnosis not present

## 2019-08-16 DIAGNOSIS — N186 End stage renal disease: Secondary | ICD-10-CM | POA: Diagnosis not present

## 2019-08-16 DIAGNOSIS — K5732 Diverticulitis of large intestine without perforation or abscess without bleeding: Secondary | ICD-10-CM | POA: Diagnosis not present

## 2019-08-18 DIAGNOSIS — D631 Anemia in chronic kidney disease: Secondary | ICD-10-CM | POA: Diagnosis not present

## 2019-08-18 DIAGNOSIS — D509 Iron deficiency anemia, unspecified: Secondary | ICD-10-CM | POA: Diagnosis not present

## 2019-08-18 DIAGNOSIS — N2581 Secondary hyperparathyroidism of renal origin: Secondary | ICD-10-CM | POA: Diagnosis not present

## 2019-08-18 DIAGNOSIS — Z992 Dependence on renal dialysis: Secondary | ICD-10-CM | POA: Diagnosis not present

## 2019-08-18 DIAGNOSIS — N186 End stage renal disease: Secondary | ICD-10-CM | POA: Diagnosis not present

## 2019-08-19 DIAGNOSIS — N186 End stage renal disease: Secondary | ICD-10-CM | POA: Diagnosis not present

## 2019-08-19 DIAGNOSIS — Z4901 Encounter for fitting and adjustment of extracorporeal dialysis catheter: Secondary | ICD-10-CM | POA: Diagnosis not present

## 2019-08-19 DIAGNOSIS — Z452 Encounter for adjustment and management of vascular access device: Secondary | ICD-10-CM | POA: Diagnosis not present

## 2019-08-20 DIAGNOSIS — D631 Anemia in chronic kidney disease: Secondary | ICD-10-CM | POA: Diagnosis not present

## 2019-08-20 DIAGNOSIS — N186 End stage renal disease: Secondary | ICD-10-CM | POA: Diagnosis not present

## 2019-08-20 DIAGNOSIS — Z992 Dependence on renal dialysis: Secondary | ICD-10-CM | POA: Diagnosis not present

## 2019-08-20 DIAGNOSIS — N2581 Secondary hyperparathyroidism of renal origin: Secondary | ICD-10-CM | POA: Diagnosis not present

## 2019-08-20 DIAGNOSIS — D509 Iron deficiency anemia, unspecified: Secondary | ICD-10-CM | POA: Diagnosis not present

## 2019-08-23 DIAGNOSIS — Z992 Dependence on renal dialysis: Secondary | ICD-10-CM | POA: Diagnosis not present

## 2019-08-23 DIAGNOSIS — N2581 Secondary hyperparathyroidism of renal origin: Secondary | ICD-10-CM | POA: Diagnosis not present

## 2019-08-23 DIAGNOSIS — D631 Anemia in chronic kidney disease: Secondary | ICD-10-CM | POA: Diagnosis not present

## 2019-08-23 DIAGNOSIS — N186 End stage renal disease: Secondary | ICD-10-CM | POA: Diagnosis not present

## 2019-08-23 DIAGNOSIS — D509 Iron deficiency anemia, unspecified: Secondary | ICD-10-CM | POA: Diagnosis not present

## 2019-08-25 DIAGNOSIS — N186 End stage renal disease: Secondary | ICD-10-CM | POA: Diagnosis not present

## 2019-08-25 DIAGNOSIS — Z992 Dependence on renal dialysis: Secondary | ICD-10-CM | POA: Diagnosis not present

## 2019-08-25 DIAGNOSIS — N2581 Secondary hyperparathyroidism of renal origin: Secondary | ICD-10-CM | POA: Diagnosis not present

## 2019-08-25 DIAGNOSIS — D631 Anemia in chronic kidney disease: Secondary | ICD-10-CM | POA: Diagnosis not present

## 2019-08-25 DIAGNOSIS — D509 Iron deficiency anemia, unspecified: Secondary | ICD-10-CM | POA: Diagnosis not present

## 2019-08-27 DIAGNOSIS — N186 End stage renal disease: Secondary | ICD-10-CM | POA: Diagnosis not present

## 2019-08-27 DIAGNOSIS — D631 Anemia in chronic kidney disease: Secondary | ICD-10-CM | POA: Diagnosis not present

## 2019-08-27 DIAGNOSIS — D509 Iron deficiency anemia, unspecified: Secondary | ICD-10-CM | POA: Diagnosis not present

## 2019-08-27 DIAGNOSIS — N2581 Secondary hyperparathyroidism of renal origin: Secondary | ICD-10-CM | POA: Diagnosis not present

## 2019-08-27 DIAGNOSIS — Z992 Dependence on renal dialysis: Secondary | ICD-10-CM | POA: Diagnosis not present

## 2019-08-29 DIAGNOSIS — Z992 Dependence on renal dialysis: Secondary | ICD-10-CM | POA: Diagnosis not present

## 2019-08-29 DIAGNOSIS — D509 Iron deficiency anemia, unspecified: Secondary | ICD-10-CM | POA: Diagnosis not present

## 2019-08-29 DIAGNOSIS — N186 End stage renal disease: Secondary | ICD-10-CM | POA: Diagnosis not present

## 2019-08-29 DIAGNOSIS — D631 Anemia in chronic kidney disease: Secondary | ICD-10-CM | POA: Diagnosis not present

## 2019-08-29 DIAGNOSIS — N2581 Secondary hyperparathyroidism of renal origin: Secondary | ICD-10-CM | POA: Diagnosis not present

## 2019-08-30 DIAGNOSIS — K648 Other hemorrhoids: Secondary | ICD-10-CM | POA: Diagnosis not present

## 2019-08-30 DIAGNOSIS — D631 Anemia in chronic kidney disease: Secondary | ICD-10-CM | POA: Diagnosis not present

## 2019-08-30 DIAGNOSIS — Z992 Dependence on renal dialysis: Secondary | ICD-10-CM | POA: Diagnosis not present

## 2019-08-30 DIAGNOSIS — D509 Iron deficiency anemia, unspecified: Secondary | ICD-10-CM | POA: Diagnosis not present

## 2019-08-30 DIAGNOSIS — N186 End stage renal disease: Secondary | ICD-10-CM | POA: Diagnosis not present

## 2019-08-30 DIAGNOSIS — N2581 Secondary hyperparathyroidism of renal origin: Secondary | ICD-10-CM | POA: Diagnosis not present

## 2019-08-30 DIAGNOSIS — K625 Hemorrhage of anus and rectum: Secondary | ICD-10-CM | POA: Diagnosis not present

## 2019-09-01 DIAGNOSIS — D631 Anemia in chronic kidney disease: Secondary | ICD-10-CM | POA: Diagnosis not present

## 2019-09-01 DIAGNOSIS — D509 Iron deficiency anemia, unspecified: Secondary | ICD-10-CM | POA: Diagnosis not present

## 2019-09-01 DIAGNOSIS — N186 End stage renal disease: Secondary | ICD-10-CM | POA: Diagnosis not present

## 2019-09-01 DIAGNOSIS — Z992 Dependence on renal dialysis: Secondary | ICD-10-CM | POA: Diagnosis not present

## 2019-09-01 DIAGNOSIS — N2581 Secondary hyperparathyroidism of renal origin: Secondary | ICD-10-CM | POA: Diagnosis not present

## 2019-09-03 DIAGNOSIS — Z992 Dependence on renal dialysis: Secondary | ICD-10-CM | POA: Diagnosis not present

## 2019-09-03 DIAGNOSIS — D509 Iron deficiency anemia, unspecified: Secondary | ICD-10-CM | POA: Diagnosis not present

## 2019-09-03 DIAGNOSIS — N186 End stage renal disease: Secondary | ICD-10-CM | POA: Diagnosis not present

## 2019-09-03 DIAGNOSIS — D631 Anemia in chronic kidney disease: Secondary | ICD-10-CM | POA: Diagnosis not present

## 2019-09-03 DIAGNOSIS — N2581 Secondary hyperparathyroidism of renal origin: Secondary | ICD-10-CM | POA: Diagnosis not present

## 2019-09-06 DIAGNOSIS — Z20822 Contact with and (suspected) exposure to covid-19: Secondary | ICD-10-CM | POA: Diagnosis present

## 2019-09-06 DIAGNOSIS — R1032 Left lower quadrant pain: Secondary | ICD-10-CM | POA: Diagnosis not present

## 2019-09-06 DIAGNOSIS — E871 Hypo-osmolality and hyponatremia: Secondary | ICD-10-CM | POA: Diagnosis not present

## 2019-09-06 DIAGNOSIS — Z9049 Acquired absence of other specified parts of digestive tract: Secondary | ICD-10-CM | POA: Diagnosis not present

## 2019-09-06 DIAGNOSIS — B952 Enterococcus as the cause of diseases classified elsewhere: Secondary | ICD-10-CM | POA: Diagnosis not present

## 2019-09-06 DIAGNOSIS — Z452 Encounter for adjustment and management of vascular access device: Secondary | ICD-10-CM | POA: Diagnosis not present

## 2019-09-06 DIAGNOSIS — K219 Gastro-esophageal reflux disease without esophagitis: Secondary | ICD-10-CM | POA: Diagnosis not present

## 2019-09-06 DIAGNOSIS — D631 Anemia in chronic kidney disease: Secondary | ICD-10-CM | POA: Diagnosis not present

## 2019-09-06 DIAGNOSIS — D649 Anemia, unspecified: Secondary | ICD-10-CM | POA: Diagnosis not present

## 2019-09-06 DIAGNOSIS — N2581 Secondary hyperparathyroidism of renal origin: Secondary | ICD-10-CM | POA: Diagnosis not present

## 2019-09-06 DIAGNOSIS — K651 Peritoneal abscess: Secondary | ICD-10-CM | POA: Diagnosis not present

## 2019-09-06 DIAGNOSIS — R392 Extrarenal uremia: Secondary | ICD-10-CM | POA: Diagnosis present

## 2019-09-06 DIAGNOSIS — Z4682 Encounter for fitting and adjustment of non-vascular catheter: Secondary | ICD-10-CM | POA: Diagnosis not present

## 2019-09-06 DIAGNOSIS — K5900 Constipation, unspecified: Secondary | ICD-10-CM | POA: Diagnosis not present

## 2019-09-06 DIAGNOSIS — D638 Anemia in other chronic diseases classified elsewhere: Secondary | ICD-10-CM | POA: Insufficient documentation

## 2019-09-06 DIAGNOSIS — J9811 Atelectasis: Secondary | ICD-10-CM | POA: Diagnosis not present

## 2019-09-06 DIAGNOSIS — I120 Hypertensive chronic kidney disease with stage 5 chronic kidney disease or end stage renal disease: Secondary | ICD-10-CM | POA: Diagnosis present

## 2019-09-06 DIAGNOSIS — K5730 Diverticulosis of large intestine without perforation or abscess without bleeding: Secondary | ICD-10-CM | POA: Diagnosis not present

## 2019-09-06 DIAGNOSIS — D509 Iron deficiency anemia, unspecified: Secondary | ICD-10-CM | POA: Diagnosis not present

## 2019-09-06 DIAGNOSIS — K625 Hemorrhage of anus and rectum: Secondary | ICD-10-CM | POA: Diagnosis not present

## 2019-09-06 DIAGNOSIS — K5720 Diverticulitis of large intestine with perforation and abscess without bleeding: Secondary | ICD-10-CM | POA: Diagnosis not present

## 2019-09-06 DIAGNOSIS — K5780 Diverticulitis of intestine, part unspecified, with perforation and abscess without bleeding: Secondary | ICD-10-CM | POA: Diagnosis not present

## 2019-09-06 DIAGNOSIS — N390 Urinary tract infection, site not specified: Secondary | ICD-10-CM | POA: Diagnosis not present

## 2019-09-06 DIAGNOSIS — M1A9XX0 Chronic gout, unspecified, without tophus (tophi): Secondary | ICD-10-CM | POA: Diagnosis present

## 2019-09-06 DIAGNOSIS — K649 Unspecified hemorrhoids: Secondary | ICD-10-CM | POA: Diagnosis present

## 2019-09-06 DIAGNOSIS — Z992 Dependence on renal dialysis: Secondary | ICD-10-CM | POA: Diagnosis not present

## 2019-09-06 DIAGNOSIS — Z0389 Encounter for observation for other suspected diseases and conditions ruled out: Secondary | ICD-10-CM | POA: Diagnosis not present

## 2019-09-06 DIAGNOSIS — N261 Atrophy of kidney (terminal): Secondary | ICD-10-CM | POA: Diagnosis not present

## 2019-09-06 DIAGNOSIS — D62 Acute posthemorrhagic anemia: Secondary | ICD-10-CM | POA: Diagnosis not present

## 2019-09-06 DIAGNOSIS — R9431 Abnormal electrocardiogram [ECG] [EKG]: Secondary | ICD-10-CM | POA: Diagnosis not present

## 2019-09-06 DIAGNOSIS — Z6841 Body Mass Index (BMI) 40.0 and over, adult: Secondary | ICD-10-CM | POA: Diagnosis not present

## 2019-09-06 DIAGNOSIS — M1712 Unilateral primary osteoarthritis, left knee: Secondary | ICD-10-CM | POA: Diagnosis present

## 2019-09-06 DIAGNOSIS — N19 Unspecified kidney failure: Secondary | ICD-10-CM | POA: Diagnosis not present

## 2019-09-06 DIAGNOSIS — R109 Unspecified abdominal pain: Secondary | ICD-10-CM | POA: Diagnosis not present

## 2019-09-06 DIAGNOSIS — D72829 Elevated white blood cell count, unspecified: Secondary | ICD-10-CM | POA: Diagnosis not present

## 2019-09-06 DIAGNOSIS — F329 Major depressive disorder, single episode, unspecified: Secondary | ICD-10-CM | POA: Diagnosis not present

## 2019-09-06 DIAGNOSIS — R601 Generalized edema: Secondary | ICD-10-CM | POA: Diagnosis not present

## 2019-09-06 DIAGNOSIS — I10 Essential (primary) hypertension: Secondary | ICD-10-CM | POA: Diagnosis not present

## 2019-09-06 DIAGNOSIS — K922 Gastrointestinal hemorrhage, unspecified: Secondary | ICD-10-CM | POA: Diagnosis not present

## 2019-09-06 DIAGNOSIS — Z86711 Personal history of pulmonary embolism: Secondary | ICD-10-CM | POA: Diagnosis not present

## 2019-09-06 DIAGNOSIS — N186 End stage renal disease: Secondary | ICD-10-CM | POA: Diagnosis not present

## 2019-09-06 DIAGNOSIS — L02211 Cutaneous abscess of abdominal wall: Secondary | ICD-10-CM | POA: Diagnosis not present

## 2019-09-26 DIAGNOSIS — R079 Chest pain, unspecified: Secondary | ICD-10-CM | POA: Diagnosis not present

## 2019-09-26 DIAGNOSIS — D631 Anemia in chronic kidney disease: Secondary | ICD-10-CM | POA: Diagnosis not present

## 2019-09-26 DIAGNOSIS — I517 Cardiomegaly: Secondary | ICD-10-CM | POA: Diagnosis not present

## 2019-09-26 DIAGNOSIS — I120 Hypertensive chronic kidney disease with stage 5 chronic kidney disease or end stage renal disease: Secondary | ICD-10-CM | POA: Diagnosis not present

## 2019-09-26 DIAGNOSIS — N186 End stage renal disease: Secondary | ICD-10-CM | POA: Diagnosis not present

## 2019-09-26 DIAGNOSIS — R109 Unspecified abdominal pain: Secondary | ICD-10-CM | POA: Diagnosis not present

## 2019-09-26 DIAGNOSIS — K5792 Diverticulitis of intestine, part unspecified, without perforation or abscess without bleeding: Secondary | ICD-10-CM | POA: Diagnosis not present

## 2019-09-26 DIAGNOSIS — R1032 Left lower quadrant pain: Secondary | ICD-10-CM | POA: Diagnosis not present

## 2019-09-26 DIAGNOSIS — T8143XA Infection following a procedure, organ and space surgical site, initial encounter: Secondary | ICD-10-CM | POA: Diagnosis not present

## 2019-09-26 DIAGNOSIS — K56600 Partial intestinal obstruction, unspecified as to cause: Secondary | ICD-10-CM | POA: Diagnosis not present

## 2019-09-26 DIAGNOSIS — N261 Atrophy of kidney (terminal): Secondary | ICD-10-CM | POA: Diagnosis not present

## 2019-09-26 DIAGNOSIS — K567 Ileus, unspecified: Secondary | ICD-10-CM | POA: Diagnosis not present

## 2019-09-26 DIAGNOSIS — R1110 Vomiting, unspecified: Secondary | ICD-10-CM | POA: Diagnosis not present

## 2019-09-26 DIAGNOSIS — K651 Peritoneal abscess: Secondary | ICD-10-CM | POA: Diagnosis not present

## 2019-09-26 DIAGNOSIS — Z992 Dependence on renal dialysis: Secondary | ICD-10-CM | POA: Diagnosis not present

## 2019-09-27 DIAGNOSIS — N390 Urinary tract infection, site not specified: Secondary | ICD-10-CM | POA: Diagnosis present

## 2019-09-27 DIAGNOSIS — Z452 Encounter for adjustment and management of vascular access device: Secondary | ICD-10-CM | POA: Diagnosis not present

## 2019-09-27 DIAGNOSIS — K56600 Partial intestinal obstruction, unspecified as to cause: Secondary | ICD-10-CM | POA: Diagnosis present

## 2019-09-27 DIAGNOSIS — K567 Ileus, unspecified: Secondary | ICD-10-CM | POA: Diagnosis not present

## 2019-09-27 DIAGNOSIS — R112 Nausea with vomiting, unspecified: Secondary | ICD-10-CM | POA: Diagnosis not present

## 2019-09-27 DIAGNOSIS — K5730 Diverticulosis of large intestine without perforation or abscess without bleeding: Secondary | ICD-10-CM | POA: Diagnosis not present

## 2019-09-27 DIAGNOSIS — K760 Fatty (change of) liver, not elsewhere classified: Secondary | ICD-10-CM | POA: Diagnosis not present

## 2019-09-27 DIAGNOSIS — R188 Other ascites: Secondary | ICD-10-CM | POA: Diagnosis not present

## 2019-09-27 DIAGNOSIS — R109 Unspecified abdominal pain: Secondary | ICD-10-CM | POA: Diagnosis not present

## 2019-09-27 DIAGNOSIS — M1A9XX0 Chronic gout, unspecified, without tophus (tophi): Secondary | ICD-10-CM | POA: Diagnosis present

## 2019-09-27 DIAGNOSIS — R4182 Altered mental status, unspecified: Secondary | ICD-10-CM | POA: Diagnosis not present

## 2019-09-27 DIAGNOSIS — K5720 Diverticulitis of large intestine with perforation and abscess without bleeding: Secondary | ICD-10-CM | POA: Diagnosis not present

## 2019-09-27 DIAGNOSIS — Z86711 Personal history of pulmonary embolism: Secondary | ICD-10-CM | POA: Diagnosis not present

## 2019-09-27 DIAGNOSIS — Z6841 Body Mass Index (BMI) 40.0 and over, adult: Secondary | ICD-10-CM | POA: Diagnosis not present

## 2019-09-27 DIAGNOSIS — K91870 Postprocedural hematoma of a digestive system organ or structure following a digestive system procedure: Secondary | ICD-10-CM | POA: Diagnosis present

## 2019-09-27 DIAGNOSIS — G2581 Restless legs syndrome: Secondary | ICD-10-CM | POA: Diagnosis not present

## 2019-09-27 DIAGNOSIS — E440 Moderate protein-calorie malnutrition: Secondary | ICD-10-CM | POA: Diagnosis not present

## 2019-09-27 DIAGNOSIS — Z20822 Contact with and (suspected) exposure to covid-19: Secondary | ICD-10-CM | POA: Diagnosis present

## 2019-09-27 DIAGNOSIS — N186 End stage renal disease: Secondary | ICD-10-CM | POA: Diagnosis not present

## 2019-09-27 DIAGNOSIS — D631 Anemia in chronic kidney disease: Secondary | ICD-10-CM | POA: Diagnosis not present

## 2019-09-27 DIAGNOSIS — K56609 Unspecified intestinal obstruction, unspecified as to partial versus complete obstruction: Secondary | ICD-10-CM | POA: Diagnosis not present

## 2019-09-27 DIAGNOSIS — L0291 Cutaneous abscess, unspecified: Secondary | ICD-10-CM | POA: Diagnosis not present

## 2019-09-27 DIAGNOSIS — Z0389 Encounter for observation for other suspected diseases and conditions ruled out: Secondary | ICD-10-CM | POA: Diagnosis not present

## 2019-09-27 DIAGNOSIS — I959 Hypotension, unspecified: Secondary | ICD-10-CM | POA: Diagnosis not present

## 2019-09-27 DIAGNOSIS — I700 Atherosclerosis of aorta: Secondary | ICD-10-CM | POA: Diagnosis not present

## 2019-09-27 DIAGNOSIS — K529 Noninfective gastroenteritis and colitis, unspecified: Secondary | ICD-10-CM | POA: Diagnosis not present

## 2019-09-27 DIAGNOSIS — Z932 Ileostomy status: Secondary | ICD-10-CM | POA: Diagnosis not present

## 2019-09-27 DIAGNOSIS — E871 Hypo-osmolality and hyponatremia: Secondary | ICD-10-CM | POA: Diagnosis present

## 2019-09-27 DIAGNOSIS — J9811 Atelectasis: Secondary | ICD-10-CM | POA: Diagnosis not present

## 2019-09-27 DIAGNOSIS — R9431 Abnormal electrocardiogram [ECG] [EKG]: Secondary | ICD-10-CM | POA: Diagnosis not present

## 2019-09-27 DIAGNOSIS — R000 Tachycardia, unspecified: Secondary | ICD-10-CM | POA: Diagnosis not present

## 2019-09-27 DIAGNOSIS — T8143XA Infection following a procedure, organ and space surgical site, initial encounter: Secondary | ICD-10-CM | POA: Diagnosis not present

## 2019-09-27 DIAGNOSIS — R140 Abdominal distension (gaseous): Secondary | ICD-10-CM | POA: Diagnosis not present

## 2019-09-27 DIAGNOSIS — K219 Gastro-esophageal reflux disease without esophagitis: Secondary | ICD-10-CM | POA: Diagnosis not present

## 2019-09-27 DIAGNOSIS — I120 Hypertensive chronic kidney disease with stage 5 chronic kidney disease or end stage renal disease: Secondary | ICD-10-CM | POA: Diagnosis not present

## 2019-09-27 DIAGNOSIS — N261 Atrophy of kidney (terminal): Secondary | ICD-10-CM | POA: Diagnosis not present

## 2019-09-27 DIAGNOSIS — Z4682 Encounter for fitting and adjustment of non-vascular catheter: Secondary | ICD-10-CM | POA: Diagnosis not present

## 2019-09-27 DIAGNOSIS — M109 Gout, unspecified: Secondary | ICD-10-CM | POA: Diagnosis not present

## 2019-09-27 DIAGNOSIS — E875 Hyperkalemia: Secondary | ICD-10-CM | POA: Diagnosis not present

## 2019-09-27 DIAGNOSIS — K651 Peritoneal abscess: Secondary | ICD-10-CM | POA: Diagnosis not present

## 2019-09-27 DIAGNOSIS — D72829 Elevated white blood cell count, unspecified: Secondary | ICD-10-CM | POA: Diagnosis not present

## 2019-09-27 DIAGNOSIS — Z9049 Acquired absence of other specified parts of digestive tract: Secondary | ICD-10-CM | POA: Diagnosis not present

## 2019-09-27 DIAGNOSIS — Z992 Dependence on renal dialysis: Secondary | ICD-10-CM | POA: Diagnosis not present

## 2019-10-02 DIAGNOSIS — G2581 Restless legs syndrome: Secondary | ICD-10-CM | POA: Insufficient documentation

## 2019-10-27 DIAGNOSIS — Z992 Dependence on renal dialysis: Secondary | ICD-10-CM | POA: Diagnosis not present

## 2019-10-27 DIAGNOSIS — D631 Anemia in chronic kidney disease: Secondary | ICD-10-CM | POA: Diagnosis not present

## 2019-10-27 DIAGNOSIS — N186 End stage renal disease: Secondary | ICD-10-CM | POA: Diagnosis not present

## 2019-10-27 DIAGNOSIS — D509 Iron deficiency anemia, unspecified: Secondary | ICD-10-CM | POA: Diagnosis not present

## 2019-10-27 DIAGNOSIS — N2581 Secondary hyperparathyroidism of renal origin: Secondary | ICD-10-CM | POA: Diagnosis not present

## 2019-10-28 DIAGNOSIS — Z992 Dependence on renal dialysis: Secondary | ICD-10-CM | POA: Diagnosis not present

## 2019-10-28 DIAGNOSIS — N186 End stage renal disease: Secondary | ICD-10-CM | POA: Diagnosis not present

## 2019-10-29 DIAGNOSIS — R110 Nausea: Secondary | ICD-10-CM | POA: Diagnosis not present

## 2019-10-29 DIAGNOSIS — D509 Iron deficiency anemia, unspecified: Secondary | ICD-10-CM | POA: Diagnosis not present

## 2019-10-29 DIAGNOSIS — Z992 Dependence on renal dialysis: Secondary | ICD-10-CM | POA: Diagnosis not present

## 2019-10-29 DIAGNOSIS — F419 Anxiety disorder, unspecified: Secondary | ICD-10-CM | POA: Diagnosis not present

## 2019-10-29 DIAGNOSIS — E8779 Other fluid overload: Secondary | ICD-10-CM | POA: Diagnosis not present

## 2019-10-29 DIAGNOSIS — N186 End stage renal disease: Secondary | ICD-10-CM | POA: Diagnosis not present

## 2019-10-29 DIAGNOSIS — D631 Anemia in chronic kidney disease: Secondary | ICD-10-CM | POA: Diagnosis not present

## 2019-11-02 DIAGNOSIS — R0602 Shortness of breath: Secondary | ICD-10-CM | POA: Diagnosis not present

## 2019-11-02 DIAGNOSIS — N186 End stage renal disease: Secondary | ICD-10-CM | POA: Diagnosis not present

## 2019-11-02 DIAGNOSIS — R112 Nausea with vomiting, unspecified: Secondary | ICD-10-CM | POA: Diagnosis not present

## 2019-11-02 DIAGNOSIS — J811 Chronic pulmonary edema: Secondary | ICD-10-CM | POA: Diagnosis not present

## 2019-11-02 DIAGNOSIS — R7401 Elevation of levels of liver transaminase levels: Secondary | ICD-10-CM | POA: Diagnosis not present

## 2019-11-02 DIAGNOSIS — D649 Anemia, unspecified: Secondary | ICD-10-CM | POA: Diagnosis not present

## 2019-11-02 DIAGNOSIS — L7634 Postprocedural seroma of skin and subcutaneous tissue following other procedure: Secondary | ICD-10-CM | POA: Diagnosis not present

## 2019-11-02 DIAGNOSIS — Z9889 Other specified postprocedural states: Secondary | ICD-10-CM | POA: Diagnosis not present

## 2019-11-02 DIAGNOSIS — Z6841 Body Mass Index (BMI) 40.0 and over, adult: Secondary | ICD-10-CM | POA: Diagnosis not present

## 2019-11-02 DIAGNOSIS — D631 Anemia in chronic kidney disease: Secondary | ICD-10-CM | POA: Diagnosis not present

## 2019-11-02 DIAGNOSIS — J9601 Acute respiratory failure with hypoxia: Secondary | ICD-10-CM | POA: Diagnosis not present

## 2019-11-02 DIAGNOSIS — Z20822 Contact with and (suspected) exposure to covid-19: Secondary | ICD-10-CM | POA: Diagnosis not present

## 2019-11-02 DIAGNOSIS — I120 Hypertensive chronic kidney disease with stage 5 chronic kidney disease or end stage renal disease: Secondary | ICD-10-CM | POA: Diagnosis not present

## 2019-11-02 DIAGNOSIS — E875 Hyperkalemia: Secondary | ICD-10-CM | POA: Diagnosis not present

## 2019-11-02 DIAGNOSIS — Z980 Intestinal bypass and anastomosis status: Secondary | ICD-10-CM | POA: Diagnosis not present

## 2019-11-02 DIAGNOSIS — R509 Fever, unspecified: Secondary | ICD-10-CM | POA: Diagnosis not present

## 2019-11-02 DIAGNOSIS — R109 Unspecified abdominal pain: Secondary | ICD-10-CM | POA: Diagnosis not present

## 2019-11-02 DIAGNOSIS — E669 Obesity, unspecified: Secondary | ICD-10-CM | POA: Diagnosis not present

## 2019-11-02 DIAGNOSIS — Z992 Dependence on renal dialysis: Secondary | ICD-10-CM | POA: Diagnosis not present

## 2019-11-02 DIAGNOSIS — J189 Pneumonia, unspecified organism: Secondary | ICD-10-CM | POA: Diagnosis not present

## 2019-11-02 DIAGNOSIS — K219 Gastro-esophageal reflux disease without esophagitis: Secondary | ICD-10-CM | POA: Diagnosis not present

## 2019-11-02 DIAGNOSIS — Z03818 Encounter for observation for suspected exposure to other biological agents ruled out: Secondary | ICD-10-CM | POA: Diagnosis not present

## 2019-11-02 DIAGNOSIS — R188 Other ascites: Secondary | ICD-10-CM | POA: Diagnosis not present

## 2019-11-02 DIAGNOSIS — Z79899 Other long term (current) drug therapy: Secondary | ICD-10-CM | POA: Diagnosis not present

## 2019-11-02 DIAGNOSIS — Z9115 Patient's noncompliance with renal dialysis: Secondary | ICD-10-CM | POA: Diagnosis not present

## 2019-11-03 DIAGNOSIS — J189 Pneumonia, unspecified organism: Secondary | ICD-10-CM | POA: Diagnosis not present

## 2019-11-03 DIAGNOSIS — I10 Essential (primary) hypertension: Secondary | ICD-10-CM | POA: Diagnosis not present

## 2019-11-03 DIAGNOSIS — R079 Chest pain, unspecified: Secondary | ICD-10-CM | POA: Diagnosis not present

## 2019-11-03 DIAGNOSIS — R0602 Shortness of breath: Secondary | ICD-10-CM | POA: Diagnosis not present

## 2019-11-03 DIAGNOSIS — E875 Hyperkalemia: Secondary | ICD-10-CM | POA: Diagnosis not present

## 2019-11-03 DIAGNOSIS — Z9889 Other specified postprocedural states: Secondary | ICD-10-CM | POA: Diagnosis not present

## 2019-11-03 DIAGNOSIS — J811 Chronic pulmonary edema: Secondary | ICD-10-CM | POA: Diagnosis not present

## 2019-11-03 DIAGNOSIS — R509 Fever, unspecified: Secondary | ICD-10-CM | POA: Diagnosis not present

## 2019-11-03 DIAGNOSIS — R112 Nausea with vomiting, unspecified: Secondary | ICD-10-CM | POA: Diagnosis not present

## 2019-11-03 DIAGNOSIS — J9601 Acute respiratory failure with hypoxia: Secondary | ICD-10-CM | POA: Diagnosis not present

## 2019-11-03 DIAGNOSIS — D638 Anemia in other chronic diseases classified elsewhere: Secondary | ICD-10-CM | POA: Diagnosis not present

## 2019-11-03 DIAGNOSIS — K219 Gastro-esophageal reflux disease without esophagitis: Secondary | ICD-10-CM | POA: Diagnosis not present

## 2019-11-03 DIAGNOSIS — R7401 Elevation of levels of liver transaminase levels: Secondary | ICD-10-CM | POA: Diagnosis not present

## 2019-11-03 DIAGNOSIS — Z992 Dependence on renal dialysis: Secondary | ICD-10-CM | POA: Diagnosis not present

## 2019-11-03 DIAGNOSIS — R109 Unspecified abdominal pain: Secondary | ICD-10-CM | POA: Diagnosis not present

## 2019-11-03 DIAGNOSIS — N186 End stage renal disease: Secondary | ICD-10-CM | POA: Diagnosis not present

## 2019-11-05 DIAGNOSIS — R110 Nausea: Secondary | ICD-10-CM | POA: Diagnosis not present

## 2019-11-05 DIAGNOSIS — N186 End stage renal disease: Secondary | ICD-10-CM | POA: Diagnosis not present

## 2019-11-05 DIAGNOSIS — F419 Anxiety disorder, unspecified: Secondary | ICD-10-CM | POA: Diagnosis not present

## 2019-11-05 DIAGNOSIS — D631 Anemia in chronic kidney disease: Secondary | ICD-10-CM | POA: Diagnosis not present

## 2019-11-05 DIAGNOSIS — Z992 Dependence on renal dialysis: Secondary | ICD-10-CM | POA: Diagnosis not present

## 2019-11-05 DIAGNOSIS — D509 Iron deficiency anemia, unspecified: Secondary | ICD-10-CM | POA: Diagnosis not present

## 2019-11-07 DIAGNOSIS — L02211 Cutaneous abscess of abdominal wall: Secondary | ICD-10-CM | POA: Diagnosis not present

## 2019-11-07 DIAGNOSIS — S301XXD Contusion of abdominal wall, subsequent encounter: Secondary | ICD-10-CM | POA: Diagnosis not present

## 2019-11-08 DIAGNOSIS — D509 Iron deficiency anemia, unspecified: Secondary | ICD-10-CM | POA: Diagnosis not present

## 2019-11-08 DIAGNOSIS — D631 Anemia in chronic kidney disease: Secondary | ICD-10-CM | POA: Diagnosis not present

## 2019-11-08 DIAGNOSIS — N186 End stage renal disease: Secondary | ICD-10-CM | POA: Diagnosis not present

## 2019-11-08 DIAGNOSIS — F419 Anxiety disorder, unspecified: Secondary | ICD-10-CM | POA: Diagnosis not present

## 2019-11-08 DIAGNOSIS — R110 Nausea: Secondary | ICD-10-CM | POA: Diagnosis not present

## 2019-11-08 DIAGNOSIS — Z992 Dependence on renal dialysis: Secondary | ICD-10-CM | POA: Diagnosis not present

## 2019-11-10 DIAGNOSIS — D631 Anemia in chronic kidney disease: Secondary | ICD-10-CM | POA: Diagnosis not present

## 2019-11-10 DIAGNOSIS — Z992 Dependence on renal dialysis: Secondary | ICD-10-CM | POA: Diagnosis not present

## 2019-11-10 DIAGNOSIS — N186 End stage renal disease: Secondary | ICD-10-CM | POA: Diagnosis not present

## 2019-11-10 DIAGNOSIS — R110 Nausea: Secondary | ICD-10-CM | POA: Diagnosis not present

## 2019-11-10 DIAGNOSIS — F419 Anxiety disorder, unspecified: Secondary | ICD-10-CM | POA: Diagnosis not present

## 2019-11-10 DIAGNOSIS — D509 Iron deficiency anemia, unspecified: Secondary | ICD-10-CM | POA: Diagnosis not present

## 2019-11-12 DIAGNOSIS — N186 End stage renal disease: Secondary | ICD-10-CM | POA: Diagnosis not present

## 2019-11-12 DIAGNOSIS — D509 Iron deficiency anemia, unspecified: Secondary | ICD-10-CM | POA: Diagnosis not present

## 2019-11-12 DIAGNOSIS — F419 Anxiety disorder, unspecified: Secondary | ICD-10-CM | POA: Diagnosis not present

## 2019-11-12 DIAGNOSIS — R110 Nausea: Secondary | ICD-10-CM | POA: Diagnosis not present

## 2019-11-12 DIAGNOSIS — Z992 Dependence on renal dialysis: Secondary | ICD-10-CM | POA: Diagnosis not present

## 2019-11-12 DIAGNOSIS — D631 Anemia in chronic kidney disease: Secondary | ICD-10-CM | POA: Diagnosis not present

## 2019-11-15 DIAGNOSIS — D509 Iron deficiency anemia, unspecified: Secondary | ICD-10-CM | POA: Diagnosis not present

## 2019-11-15 DIAGNOSIS — R110 Nausea: Secondary | ICD-10-CM | POA: Diagnosis not present

## 2019-11-15 DIAGNOSIS — N186 End stage renal disease: Secondary | ICD-10-CM | POA: Diagnosis not present

## 2019-11-15 DIAGNOSIS — F419 Anxiety disorder, unspecified: Secondary | ICD-10-CM | POA: Diagnosis not present

## 2019-11-15 DIAGNOSIS — D631 Anemia in chronic kidney disease: Secondary | ICD-10-CM | POA: Diagnosis not present

## 2019-11-15 DIAGNOSIS — Z992 Dependence on renal dialysis: Secondary | ICD-10-CM | POA: Diagnosis not present

## 2019-11-16 DIAGNOSIS — R110 Nausea: Secondary | ICD-10-CM | POA: Diagnosis not present

## 2019-11-16 DIAGNOSIS — F419 Anxiety disorder, unspecified: Secondary | ICD-10-CM | POA: Diagnosis not present

## 2019-11-16 DIAGNOSIS — D509 Iron deficiency anemia, unspecified: Secondary | ICD-10-CM | POA: Diagnosis not present

## 2019-11-16 DIAGNOSIS — N186 End stage renal disease: Secondary | ICD-10-CM | POA: Diagnosis not present

## 2019-11-16 DIAGNOSIS — D631 Anemia in chronic kidney disease: Secondary | ICD-10-CM | POA: Diagnosis not present

## 2019-11-16 DIAGNOSIS — Z992 Dependence on renal dialysis: Secondary | ICD-10-CM | POA: Diagnosis not present

## 2019-11-17 DIAGNOSIS — D631 Anemia in chronic kidney disease: Secondary | ICD-10-CM | POA: Diagnosis not present

## 2019-11-17 DIAGNOSIS — R110 Nausea: Secondary | ICD-10-CM | POA: Diagnosis not present

## 2019-11-17 DIAGNOSIS — D509 Iron deficiency anemia, unspecified: Secondary | ICD-10-CM | POA: Diagnosis not present

## 2019-11-17 DIAGNOSIS — F419 Anxiety disorder, unspecified: Secondary | ICD-10-CM | POA: Diagnosis not present

## 2019-11-17 DIAGNOSIS — Z992 Dependence on renal dialysis: Secondary | ICD-10-CM | POA: Diagnosis not present

## 2019-11-17 DIAGNOSIS — N186 End stage renal disease: Secondary | ICD-10-CM | POA: Diagnosis not present

## 2019-11-19 DIAGNOSIS — D631 Anemia in chronic kidney disease: Secondary | ICD-10-CM | POA: Diagnosis not present

## 2019-11-19 DIAGNOSIS — R110 Nausea: Secondary | ICD-10-CM | POA: Diagnosis not present

## 2019-11-19 DIAGNOSIS — D509 Iron deficiency anemia, unspecified: Secondary | ICD-10-CM | POA: Diagnosis not present

## 2019-11-19 DIAGNOSIS — N186 End stage renal disease: Secondary | ICD-10-CM | POA: Diagnosis not present

## 2019-11-19 DIAGNOSIS — F419 Anxiety disorder, unspecified: Secondary | ICD-10-CM | POA: Diagnosis not present

## 2019-11-19 DIAGNOSIS — Z992 Dependence on renal dialysis: Secondary | ICD-10-CM | POA: Diagnosis not present

## 2019-11-22 DIAGNOSIS — D509 Iron deficiency anemia, unspecified: Secondary | ICD-10-CM | POA: Diagnosis not present

## 2019-11-22 DIAGNOSIS — F419 Anxiety disorder, unspecified: Secondary | ICD-10-CM | POA: Diagnosis not present

## 2019-11-22 DIAGNOSIS — Z992 Dependence on renal dialysis: Secondary | ICD-10-CM | POA: Diagnosis not present

## 2019-11-22 DIAGNOSIS — R110 Nausea: Secondary | ICD-10-CM | POA: Diagnosis not present

## 2019-11-22 DIAGNOSIS — N186 End stage renal disease: Secondary | ICD-10-CM | POA: Diagnosis not present

## 2019-11-22 DIAGNOSIS — D631 Anemia in chronic kidney disease: Secondary | ICD-10-CM | POA: Diagnosis not present

## 2019-11-23 DIAGNOSIS — N186 End stage renal disease: Secondary | ICD-10-CM | POA: Diagnosis not present

## 2019-11-23 DIAGNOSIS — R110 Nausea: Secondary | ICD-10-CM | POA: Diagnosis not present

## 2019-11-23 DIAGNOSIS — D631 Anemia in chronic kidney disease: Secondary | ICD-10-CM | POA: Diagnosis not present

## 2019-11-23 DIAGNOSIS — D509 Iron deficiency anemia, unspecified: Secondary | ICD-10-CM | POA: Diagnosis not present

## 2019-11-23 DIAGNOSIS — Z992 Dependence on renal dialysis: Secondary | ICD-10-CM | POA: Diagnosis not present

## 2019-11-23 DIAGNOSIS — F419 Anxiety disorder, unspecified: Secondary | ICD-10-CM | POA: Diagnosis not present

## 2019-11-24 DIAGNOSIS — Z992 Dependence on renal dialysis: Secondary | ICD-10-CM | POA: Diagnosis not present

## 2019-11-24 DIAGNOSIS — Z79899 Other long term (current) drug therapy: Secondary | ICD-10-CM | POA: Diagnosis not present

## 2019-11-24 DIAGNOSIS — D509 Iron deficiency anemia, unspecified: Secondary | ICD-10-CM | POA: Diagnosis not present

## 2019-11-24 DIAGNOSIS — N186 End stage renal disease: Secondary | ICD-10-CM | POA: Diagnosis not present

## 2019-11-24 DIAGNOSIS — R110 Nausea: Secondary | ICD-10-CM | POA: Diagnosis not present

## 2019-11-24 DIAGNOSIS — D631 Anemia in chronic kidney disease: Secondary | ICD-10-CM | POA: Diagnosis not present

## 2019-11-24 DIAGNOSIS — F419 Anxiety disorder, unspecified: Secondary | ICD-10-CM | POA: Diagnosis not present

## 2019-11-26 DIAGNOSIS — R110 Nausea: Secondary | ICD-10-CM | POA: Diagnosis not present

## 2019-11-26 DIAGNOSIS — D509 Iron deficiency anemia, unspecified: Secondary | ICD-10-CM | POA: Diagnosis not present

## 2019-11-26 DIAGNOSIS — F419 Anxiety disorder, unspecified: Secondary | ICD-10-CM | POA: Diagnosis not present

## 2019-11-26 DIAGNOSIS — N186 End stage renal disease: Secondary | ICD-10-CM | POA: Diagnosis not present

## 2019-11-26 DIAGNOSIS — D631 Anemia in chronic kidney disease: Secondary | ICD-10-CM | POA: Diagnosis not present

## 2019-11-26 DIAGNOSIS — Z992 Dependence on renal dialysis: Secondary | ICD-10-CM | POA: Diagnosis not present

## 2019-12-02 DIAGNOSIS — K5720 Diverticulitis of large intestine with perforation and abscess without bleeding: Secondary | ICD-10-CM | POA: Diagnosis not present

## 2019-12-21 DIAGNOSIS — Z23 Encounter for immunization: Secondary | ICD-10-CM | POA: Diagnosis not present

## 2019-12-29 DIAGNOSIS — E8779 Other fluid overload: Secondary | ICD-10-CM | POA: Diagnosis not present

## 2019-12-29 DIAGNOSIS — D509 Iron deficiency anemia, unspecified: Secondary | ICD-10-CM | POA: Diagnosis not present

## 2019-12-29 DIAGNOSIS — E669 Obesity, unspecified: Secondary | ICD-10-CM | POA: Diagnosis not present

## 2019-12-29 DIAGNOSIS — Z992 Dependence on renal dialysis: Secondary | ICD-10-CM | POA: Diagnosis not present

## 2019-12-29 DIAGNOSIS — N2581 Secondary hyperparathyroidism of renal origin: Secondary | ICD-10-CM | POA: Diagnosis not present

## 2019-12-29 DIAGNOSIS — R6883 Chills (without fever): Secondary | ICD-10-CM | POA: Diagnosis not present

## 2019-12-29 DIAGNOSIS — G2581 Restless legs syndrome: Secondary | ICD-10-CM | POA: Diagnosis not present

## 2019-12-29 DIAGNOSIS — R110 Nausea: Secondary | ICD-10-CM | POA: Diagnosis not present

## 2019-12-29 DIAGNOSIS — D631 Anemia in chronic kidney disease: Secondary | ICD-10-CM | POA: Diagnosis not present

## 2019-12-29 DIAGNOSIS — N186 End stage renal disease: Secondary | ICD-10-CM | POA: Diagnosis not present

## 2019-12-31 DIAGNOSIS — Z992 Dependence on renal dialysis: Secondary | ICD-10-CM | POA: Diagnosis not present

## 2019-12-31 DIAGNOSIS — N186 End stage renal disease: Secondary | ICD-10-CM | POA: Diagnosis not present

## 2019-12-31 DIAGNOSIS — R110 Nausea: Secondary | ICD-10-CM | POA: Diagnosis not present

## 2019-12-31 DIAGNOSIS — N2581 Secondary hyperparathyroidism of renal origin: Secondary | ICD-10-CM | POA: Diagnosis not present

## 2019-12-31 DIAGNOSIS — E8779 Other fluid overload: Secondary | ICD-10-CM | POA: Diagnosis not present

## 2019-12-31 DIAGNOSIS — D509 Iron deficiency anemia, unspecified: Secondary | ICD-10-CM | POA: Diagnosis not present

## 2020-01-03 DIAGNOSIS — D509 Iron deficiency anemia, unspecified: Secondary | ICD-10-CM | POA: Diagnosis not present

## 2020-01-03 DIAGNOSIS — N186 End stage renal disease: Secondary | ICD-10-CM | POA: Diagnosis not present

## 2020-01-03 DIAGNOSIS — E8779 Other fluid overload: Secondary | ICD-10-CM | POA: Diagnosis not present

## 2020-01-03 DIAGNOSIS — R110 Nausea: Secondary | ICD-10-CM | POA: Diagnosis not present

## 2020-01-03 DIAGNOSIS — Z992 Dependence on renal dialysis: Secondary | ICD-10-CM | POA: Diagnosis not present

## 2020-01-03 DIAGNOSIS — N2581 Secondary hyperparathyroidism of renal origin: Secondary | ICD-10-CM | POA: Diagnosis not present

## 2020-01-05 DIAGNOSIS — D509 Iron deficiency anemia, unspecified: Secondary | ICD-10-CM | POA: Diagnosis not present

## 2020-01-05 DIAGNOSIS — Z992 Dependence on renal dialysis: Secondary | ICD-10-CM | POA: Diagnosis not present

## 2020-01-05 DIAGNOSIS — R110 Nausea: Secondary | ICD-10-CM | POA: Diagnosis not present

## 2020-01-05 DIAGNOSIS — E8779 Other fluid overload: Secondary | ICD-10-CM | POA: Diagnosis not present

## 2020-01-05 DIAGNOSIS — N2581 Secondary hyperparathyroidism of renal origin: Secondary | ICD-10-CM | POA: Diagnosis not present

## 2020-01-05 DIAGNOSIS — N186 End stage renal disease: Secondary | ICD-10-CM | POA: Diagnosis not present

## 2020-01-06 DIAGNOSIS — Z992 Dependence on renal dialysis: Secondary | ICD-10-CM | POA: Diagnosis not present

## 2020-01-06 DIAGNOSIS — E8779 Other fluid overload: Secondary | ICD-10-CM | POA: Diagnosis not present

## 2020-01-06 DIAGNOSIS — N186 End stage renal disease: Secondary | ICD-10-CM | POA: Diagnosis not present

## 2020-01-06 DIAGNOSIS — R110 Nausea: Secondary | ICD-10-CM | POA: Diagnosis not present

## 2020-01-06 DIAGNOSIS — D509 Iron deficiency anemia, unspecified: Secondary | ICD-10-CM | POA: Diagnosis not present

## 2020-01-06 DIAGNOSIS — N2581 Secondary hyperparathyroidism of renal origin: Secondary | ICD-10-CM | POA: Diagnosis not present

## 2020-01-09 DIAGNOSIS — Z992 Dependence on renal dialysis: Secondary | ICD-10-CM | POA: Diagnosis not present

## 2020-01-09 DIAGNOSIS — D509 Iron deficiency anemia, unspecified: Secondary | ICD-10-CM | POA: Diagnosis not present

## 2020-01-09 DIAGNOSIS — R110 Nausea: Secondary | ICD-10-CM | POA: Diagnosis not present

## 2020-01-09 DIAGNOSIS — N2581 Secondary hyperparathyroidism of renal origin: Secondary | ICD-10-CM | POA: Diagnosis not present

## 2020-01-09 DIAGNOSIS — E8779 Other fluid overload: Secondary | ICD-10-CM | POA: Diagnosis not present

## 2020-01-09 DIAGNOSIS — N186 End stage renal disease: Secondary | ICD-10-CM | POA: Diagnosis not present

## 2020-01-10 DIAGNOSIS — N186 End stage renal disease: Secondary | ICD-10-CM | POA: Diagnosis not present

## 2020-01-10 DIAGNOSIS — D631 Anemia in chronic kidney disease: Secondary | ICD-10-CM | POA: Diagnosis not present

## 2020-01-10 DIAGNOSIS — D509 Iron deficiency anemia, unspecified: Secondary | ICD-10-CM | POA: Diagnosis not present

## 2020-01-10 DIAGNOSIS — Z992 Dependence on renal dialysis: Secondary | ICD-10-CM | POA: Diagnosis not present

## 2020-01-12 DIAGNOSIS — Z992 Dependence on renal dialysis: Secondary | ICD-10-CM | POA: Diagnosis not present

## 2020-01-12 DIAGNOSIS — D509 Iron deficiency anemia, unspecified: Secondary | ICD-10-CM | POA: Diagnosis not present

## 2020-01-12 DIAGNOSIS — E8779 Other fluid overload: Secondary | ICD-10-CM | POA: Diagnosis not present

## 2020-01-12 DIAGNOSIS — N186 End stage renal disease: Secondary | ICD-10-CM | POA: Diagnosis not present

## 2020-01-12 DIAGNOSIS — R110 Nausea: Secondary | ICD-10-CM | POA: Diagnosis not present

## 2020-01-12 DIAGNOSIS — N2581 Secondary hyperparathyroidism of renal origin: Secondary | ICD-10-CM | POA: Diagnosis not present

## 2020-01-14 DIAGNOSIS — Z992 Dependence on renal dialysis: Secondary | ICD-10-CM | POA: Diagnosis not present

## 2020-01-14 DIAGNOSIS — D509 Iron deficiency anemia, unspecified: Secondary | ICD-10-CM | POA: Diagnosis not present

## 2020-01-14 DIAGNOSIS — R110 Nausea: Secondary | ICD-10-CM | POA: Diagnosis not present

## 2020-01-14 DIAGNOSIS — N186 End stage renal disease: Secondary | ICD-10-CM | POA: Diagnosis not present

## 2020-01-14 DIAGNOSIS — E8779 Other fluid overload: Secondary | ICD-10-CM | POA: Diagnosis not present

## 2020-01-14 DIAGNOSIS — N2581 Secondary hyperparathyroidism of renal origin: Secondary | ICD-10-CM | POA: Diagnosis not present

## 2020-01-17 DIAGNOSIS — R110 Nausea: Secondary | ICD-10-CM | POA: Diagnosis not present

## 2020-01-17 DIAGNOSIS — Z992 Dependence on renal dialysis: Secondary | ICD-10-CM | POA: Diagnosis not present

## 2020-01-17 DIAGNOSIS — N2581 Secondary hyperparathyroidism of renal origin: Secondary | ICD-10-CM | POA: Diagnosis not present

## 2020-01-17 DIAGNOSIS — D509 Iron deficiency anemia, unspecified: Secondary | ICD-10-CM | POA: Diagnosis not present

## 2020-01-17 DIAGNOSIS — E8779 Other fluid overload: Secondary | ICD-10-CM | POA: Diagnosis not present

## 2020-01-17 DIAGNOSIS — N186 End stage renal disease: Secondary | ICD-10-CM | POA: Diagnosis not present

## 2020-01-19 DIAGNOSIS — N186 End stage renal disease: Secondary | ICD-10-CM | POA: Diagnosis not present

## 2020-01-19 DIAGNOSIS — R110 Nausea: Secondary | ICD-10-CM | POA: Diagnosis not present

## 2020-01-19 DIAGNOSIS — Z992 Dependence on renal dialysis: Secondary | ICD-10-CM | POA: Diagnosis not present

## 2020-01-19 DIAGNOSIS — E8779 Other fluid overload: Secondary | ICD-10-CM | POA: Diagnosis not present

## 2020-01-19 DIAGNOSIS — D509 Iron deficiency anemia, unspecified: Secondary | ICD-10-CM | POA: Diagnosis not present

## 2020-01-19 DIAGNOSIS — N2581 Secondary hyperparathyroidism of renal origin: Secondary | ICD-10-CM | POA: Diagnosis not present

## 2020-01-21 DIAGNOSIS — E8779 Other fluid overload: Secondary | ICD-10-CM | POA: Diagnosis not present

## 2020-01-21 DIAGNOSIS — N2581 Secondary hyperparathyroidism of renal origin: Secondary | ICD-10-CM | POA: Diagnosis not present

## 2020-01-21 DIAGNOSIS — Z992 Dependence on renal dialysis: Secondary | ICD-10-CM | POA: Diagnosis not present

## 2020-01-21 DIAGNOSIS — N186 End stage renal disease: Secondary | ICD-10-CM | POA: Diagnosis not present

## 2020-01-21 DIAGNOSIS — R110 Nausea: Secondary | ICD-10-CM | POA: Diagnosis not present

## 2020-01-21 DIAGNOSIS — D509 Iron deficiency anemia, unspecified: Secondary | ICD-10-CM | POA: Diagnosis not present

## 2020-01-24 DIAGNOSIS — E8779 Other fluid overload: Secondary | ICD-10-CM | POA: Diagnosis not present

## 2020-01-24 DIAGNOSIS — N2581 Secondary hyperparathyroidism of renal origin: Secondary | ICD-10-CM | POA: Diagnosis not present

## 2020-01-24 DIAGNOSIS — R110 Nausea: Secondary | ICD-10-CM | POA: Diagnosis not present

## 2020-01-24 DIAGNOSIS — D509 Iron deficiency anemia, unspecified: Secondary | ICD-10-CM | POA: Diagnosis not present

## 2020-01-24 DIAGNOSIS — Z992 Dependence on renal dialysis: Secondary | ICD-10-CM | POA: Diagnosis not present

## 2020-01-24 DIAGNOSIS — N186 End stage renal disease: Secondary | ICD-10-CM | POA: Diagnosis not present

## 2020-01-25 DIAGNOSIS — Z992 Dependence on renal dialysis: Secondary | ICD-10-CM | POA: Diagnosis not present

## 2020-01-25 DIAGNOSIS — E8779 Other fluid overload: Secondary | ICD-10-CM | POA: Diagnosis not present

## 2020-01-25 DIAGNOSIS — N2581 Secondary hyperparathyroidism of renal origin: Secondary | ICD-10-CM | POA: Diagnosis not present

## 2020-01-25 DIAGNOSIS — R110 Nausea: Secondary | ICD-10-CM | POA: Diagnosis not present

## 2020-01-25 DIAGNOSIS — N186 End stage renal disease: Secondary | ICD-10-CM | POA: Diagnosis not present

## 2020-01-25 DIAGNOSIS — D509 Iron deficiency anemia, unspecified: Secondary | ICD-10-CM | POA: Diagnosis not present

## 2020-01-26 DIAGNOSIS — I120 Hypertensive chronic kidney disease with stage 5 chronic kidney disease or end stage renal disease: Secondary | ICD-10-CM | POA: Diagnosis not present

## 2020-01-26 DIAGNOSIS — N2581 Secondary hyperparathyroidism of renal origin: Secondary | ICD-10-CM | POA: Diagnosis not present

## 2020-01-26 DIAGNOSIS — R0602 Shortness of breath: Secondary | ICD-10-CM | POA: Diagnosis not present

## 2020-01-26 DIAGNOSIS — R079 Chest pain, unspecified: Secondary | ICD-10-CM | POA: Diagnosis not present

## 2020-01-26 DIAGNOSIS — K219 Gastro-esophageal reflux disease without esophagitis: Secondary | ICD-10-CM | POA: Diagnosis not present

## 2020-01-26 DIAGNOSIS — Z992 Dependence on renal dialysis: Secondary | ICD-10-CM | POA: Diagnosis not present

## 2020-01-26 DIAGNOSIS — D631 Anemia in chronic kidney disease: Secondary | ICD-10-CM | POA: Diagnosis not present

## 2020-01-26 DIAGNOSIS — R110 Nausea: Secondary | ICD-10-CM | POA: Diagnosis not present

## 2020-01-26 DIAGNOSIS — M1A9XX0 Chronic gout, unspecified, without tophus (tophi): Secondary | ICD-10-CM | POA: Diagnosis not present

## 2020-01-26 DIAGNOSIS — N186 End stage renal disease: Secondary | ICD-10-CM | POA: Diagnosis not present

## 2020-01-26 DIAGNOSIS — Z86711 Personal history of pulmonary embolism: Secondary | ICD-10-CM | POA: Diagnosis not present

## 2020-01-26 DIAGNOSIS — D509 Iron deficiency anemia, unspecified: Secondary | ICD-10-CM | POA: Diagnosis not present

## 2020-01-26 DIAGNOSIS — R0789 Other chest pain: Secondary | ICD-10-CM | POA: Diagnosis not present

## 2020-01-26 DIAGNOSIS — Z79899 Other long term (current) drug therapy: Secondary | ICD-10-CM | POA: Diagnosis not present

## 2020-01-26 DIAGNOSIS — I517 Cardiomegaly: Secondary | ICD-10-CM | POA: Diagnosis not present

## 2020-01-26 DIAGNOSIS — E8779 Other fluid overload: Secondary | ICD-10-CM | POA: Diagnosis not present

## 2020-01-27 DIAGNOSIS — M7989 Other specified soft tissue disorders: Secondary | ICD-10-CM | POA: Diagnosis not present

## 2020-01-27 DIAGNOSIS — I120 Hypertensive chronic kidney disease with stage 5 chronic kidney disease or end stage renal disease: Secondary | ICD-10-CM | POA: Diagnosis not present

## 2020-01-27 DIAGNOSIS — M1712 Unilateral primary osteoarthritis, left knee: Secondary | ICD-10-CM | POA: Diagnosis not present

## 2020-01-27 DIAGNOSIS — M1A9XX0 Chronic gout, unspecified, without tophus (tophi): Secondary | ICD-10-CM | POA: Diagnosis not present

## 2020-01-27 DIAGNOSIS — K219 Gastro-esophageal reflux disease without esophagitis: Secondary | ICD-10-CM | POA: Diagnosis not present

## 2020-01-27 DIAGNOSIS — M79605 Pain in left leg: Secondary | ICD-10-CM | POA: Diagnosis not present

## 2020-01-27 DIAGNOSIS — R0602 Shortness of breath: Secondary | ICD-10-CM | POA: Diagnosis not present

## 2020-01-27 DIAGNOSIS — Z992 Dependence on renal dialysis: Secondary | ICD-10-CM | POA: Diagnosis not present

## 2020-01-27 DIAGNOSIS — G2581 Restless legs syndrome: Secondary | ICD-10-CM | POA: Diagnosis not present

## 2020-01-27 DIAGNOSIS — Z79899 Other long term (current) drug therapy: Secondary | ICD-10-CM | POA: Diagnosis not present

## 2020-01-27 DIAGNOSIS — Z86711 Personal history of pulmonary embolism: Secondary | ICD-10-CM | POA: Diagnosis not present

## 2020-01-27 DIAGNOSIS — R252 Cramp and spasm: Secondary | ICD-10-CM | POA: Diagnosis not present

## 2020-01-27 DIAGNOSIS — E875 Hyperkalemia: Secondary | ICD-10-CM | POA: Diagnosis not present

## 2020-01-27 DIAGNOSIS — N186 End stage renal disease: Secondary | ICD-10-CM | POA: Diagnosis not present

## 2020-01-27 DIAGNOSIS — M79662 Pain in left lower leg: Secondary | ICD-10-CM | POA: Diagnosis not present

## 2020-01-27 DIAGNOSIS — D631 Anemia in chronic kidney disease: Secondary | ICD-10-CM | POA: Diagnosis not present

## 2020-01-28 DIAGNOSIS — R110 Nausea: Secondary | ICD-10-CM | POA: Diagnosis not present

## 2020-01-28 DIAGNOSIS — E8779 Other fluid overload: Secondary | ICD-10-CM | POA: Diagnosis not present

## 2020-01-28 DIAGNOSIS — Z992 Dependence on renal dialysis: Secondary | ICD-10-CM | POA: Diagnosis not present

## 2020-01-28 DIAGNOSIS — D509 Iron deficiency anemia, unspecified: Secondary | ICD-10-CM | POA: Diagnosis not present

## 2020-01-28 DIAGNOSIS — N2581 Secondary hyperparathyroidism of renal origin: Secondary | ICD-10-CM | POA: Diagnosis not present

## 2020-01-28 DIAGNOSIS — N186 End stage renal disease: Secondary | ICD-10-CM | POA: Diagnosis not present

## 2020-01-29 DIAGNOSIS — Z992 Dependence on renal dialysis: Secondary | ICD-10-CM | POA: Diagnosis not present

## 2020-01-29 DIAGNOSIS — N186 End stage renal disease: Secondary | ICD-10-CM | POA: Diagnosis not present

## 2020-01-31 DIAGNOSIS — N186 End stage renal disease: Secondary | ICD-10-CM | POA: Diagnosis not present

## 2020-01-31 DIAGNOSIS — Z992 Dependence on renal dialysis: Secondary | ICD-10-CM | POA: Diagnosis not present

## 2020-01-31 DIAGNOSIS — D631 Anemia in chronic kidney disease: Secondary | ICD-10-CM | POA: Diagnosis not present

## 2020-01-31 DIAGNOSIS — D509 Iron deficiency anemia, unspecified: Secondary | ICD-10-CM | POA: Diagnosis not present

## 2020-02-02 DIAGNOSIS — N186 End stage renal disease: Secondary | ICD-10-CM | POA: Diagnosis not present

## 2020-02-02 DIAGNOSIS — D509 Iron deficiency anemia, unspecified: Secondary | ICD-10-CM | POA: Diagnosis not present

## 2020-02-02 DIAGNOSIS — D631 Anemia in chronic kidney disease: Secondary | ICD-10-CM | POA: Diagnosis not present

## 2020-02-02 DIAGNOSIS — Z992 Dependence on renal dialysis: Secondary | ICD-10-CM | POA: Diagnosis not present

## 2020-02-04 DIAGNOSIS — N186 End stage renal disease: Secondary | ICD-10-CM | POA: Diagnosis not present

## 2020-02-04 DIAGNOSIS — Z992 Dependence on renal dialysis: Secondary | ICD-10-CM | POA: Diagnosis not present

## 2020-02-04 DIAGNOSIS — D631 Anemia in chronic kidney disease: Secondary | ICD-10-CM | POA: Diagnosis not present

## 2020-02-04 DIAGNOSIS — D509 Iron deficiency anemia, unspecified: Secondary | ICD-10-CM | POA: Diagnosis not present

## 2020-02-07 DIAGNOSIS — D631 Anemia in chronic kidney disease: Secondary | ICD-10-CM | POA: Diagnosis not present

## 2020-02-07 DIAGNOSIS — D509 Iron deficiency anemia, unspecified: Secondary | ICD-10-CM | POA: Diagnosis not present

## 2020-02-07 DIAGNOSIS — N186 End stage renal disease: Secondary | ICD-10-CM | POA: Diagnosis not present

## 2020-02-07 DIAGNOSIS — Z992 Dependence on renal dialysis: Secondary | ICD-10-CM | POA: Diagnosis not present

## 2020-02-09 DIAGNOSIS — D631 Anemia in chronic kidney disease: Secondary | ICD-10-CM | POA: Diagnosis not present

## 2020-02-09 DIAGNOSIS — D509 Iron deficiency anemia, unspecified: Secondary | ICD-10-CM | POA: Diagnosis not present

## 2020-02-09 DIAGNOSIS — N186 End stage renal disease: Secondary | ICD-10-CM | POA: Diagnosis not present

## 2020-02-09 DIAGNOSIS — Z992 Dependence on renal dialysis: Secondary | ICD-10-CM | POA: Diagnosis not present

## 2020-02-11 DIAGNOSIS — D509 Iron deficiency anemia, unspecified: Secondary | ICD-10-CM | POA: Diagnosis not present

## 2020-02-11 DIAGNOSIS — Z992 Dependence on renal dialysis: Secondary | ICD-10-CM | POA: Diagnosis not present

## 2020-02-11 DIAGNOSIS — D631 Anemia in chronic kidney disease: Secondary | ICD-10-CM | POA: Diagnosis not present

## 2020-02-11 DIAGNOSIS — N186 End stage renal disease: Secondary | ICD-10-CM | POA: Diagnosis not present

## 2020-02-14 DIAGNOSIS — N186 End stage renal disease: Secondary | ICD-10-CM | POA: Diagnosis not present

## 2020-02-14 DIAGNOSIS — D509 Iron deficiency anemia, unspecified: Secondary | ICD-10-CM | POA: Diagnosis not present

## 2020-02-14 DIAGNOSIS — Z992 Dependence on renal dialysis: Secondary | ICD-10-CM | POA: Diagnosis not present

## 2020-02-14 DIAGNOSIS — D631 Anemia in chronic kidney disease: Secondary | ICD-10-CM | POA: Diagnosis not present

## 2020-02-16 DIAGNOSIS — N186 End stage renal disease: Secondary | ICD-10-CM | POA: Diagnosis not present

## 2020-02-16 DIAGNOSIS — D631 Anemia in chronic kidney disease: Secondary | ICD-10-CM | POA: Diagnosis not present

## 2020-02-16 DIAGNOSIS — D509 Iron deficiency anemia, unspecified: Secondary | ICD-10-CM | POA: Diagnosis not present

## 2020-02-16 DIAGNOSIS — Z992 Dependence on renal dialysis: Secondary | ICD-10-CM | POA: Diagnosis not present

## 2020-02-18 DIAGNOSIS — Z992 Dependence on renal dialysis: Secondary | ICD-10-CM | POA: Diagnosis not present

## 2020-02-18 DIAGNOSIS — N186 End stage renal disease: Secondary | ICD-10-CM | POA: Diagnosis not present

## 2020-02-18 DIAGNOSIS — D631 Anemia in chronic kidney disease: Secondary | ICD-10-CM | POA: Diagnosis not present

## 2020-02-18 DIAGNOSIS — D509 Iron deficiency anemia, unspecified: Secondary | ICD-10-CM | POA: Diagnosis not present

## 2020-02-18 DIAGNOSIS — R05 Cough: Secondary | ICD-10-CM | POA: Diagnosis not present

## 2020-02-21 DIAGNOSIS — N186 End stage renal disease: Secondary | ICD-10-CM | POA: Diagnosis not present

## 2020-02-21 DIAGNOSIS — D631 Anemia in chronic kidney disease: Secondary | ICD-10-CM | POA: Diagnosis not present

## 2020-02-21 DIAGNOSIS — D509 Iron deficiency anemia, unspecified: Secondary | ICD-10-CM | POA: Diagnosis not present

## 2020-02-21 DIAGNOSIS — Z992 Dependence on renal dialysis: Secondary | ICD-10-CM | POA: Diagnosis not present

## 2020-02-23 DIAGNOSIS — D631 Anemia in chronic kidney disease: Secondary | ICD-10-CM | POA: Diagnosis not present

## 2020-02-23 DIAGNOSIS — D509 Iron deficiency anemia, unspecified: Secondary | ICD-10-CM | POA: Diagnosis not present

## 2020-02-23 DIAGNOSIS — N186 End stage renal disease: Secondary | ICD-10-CM | POA: Diagnosis not present

## 2020-02-23 DIAGNOSIS — Z992 Dependence on renal dialysis: Secondary | ICD-10-CM | POA: Diagnosis not present

## 2020-02-25 DIAGNOSIS — D509 Iron deficiency anemia, unspecified: Secondary | ICD-10-CM | POA: Diagnosis not present

## 2020-02-25 DIAGNOSIS — Z992 Dependence on renal dialysis: Secondary | ICD-10-CM | POA: Diagnosis not present

## 2020-02-25 DIAGNOSIS — N186 End stage renal disease: Secondary | ICD-10-CM | POA: Diagnosis not present

## 2020-02-25 DIAGNOSIS — D631 Anemia in chronic kidney disease: Secondary | ICD-10-CM | POA: Diagnosis not present

## 2020-02-28 DIAGNOSIS — D631 Anemia in chronic kidney disease: Secondary | ICD-10-CM | POA: Diagnosis not present

## 2020-02-28 DIAGNOSIS — D509 Iron deficiency anemia, unspecified: Secondary | ICD-10-CM | POA: Diagnosis not present

## 2020-02-28 DIAGNOSIS — N186 End stage renal disease: Secondary | ICD-10-CM | POA: Diagnosis not present

## 2020-02-28 DIAGNOSIS — Z992 Dependence on renal dialysis: Secondary | ICD-10-CM | POA: Diagnosis not present

## 2020-02-29 DIAGNOSIS — N186 End stage renal disease: Secondary | ICD-10-CM | POA: Diagnosis not present

## 2020-02-29 DIAGNOSIS — Z992 Dependence on renal dialysis: Secondary | ICD-10-CM | POA: Diagnosis not present

## 2020-03-01 DIAGNOSIS — E8779 Other fluid overload: Secondary | ICD-10-CM | POA: Diagnosis not present

## 2020-03-01 DIAGNOSIS — Z992 Dependence on renal dialysis: Secondary | ICD-10-CM | POA: Diagnosis not present

## 2020-03-01 DIAGNOSIS — D509 Iron deficiency anemia, unspecified: Secondary | ICD-10-CM | POA: Diagnosis not present

## 2020-03-01 DIAGNOSIS — D631 Anemia in chronic kidney disease: Secondary | ICD-10-CM | POA: Diagnosis not present

## 2020-03-01 DIAGNOSIS — N186 End stage renal disease: Secondary | ICD-10-CM | POA: Diagnosis not present

## 2020-03-01 DIAGNOSIS — R110 Nausea: Secondary | ICD-10-CM | POA: Diagnosis not present

## 2020-03-03 DIAGNOSIS — N186 End stage renal disease: Secondary | ICD-10-CM | POA: Diagnosis not present

## 2020-03-03 DIAGNOSIS — R110 Nausea: Secondary | ICD-10-CM | POA: Diagnosis not present

## 2020-03-03 DIAGNOSIS — D509 Iron deficiency anemia, unspecified: Secondary | ICD-10-CM | POA: Diagnosis not present

## 2020-03-03 DIAGNOSIS — D631 Anemia in chronic kidney disease: Secondary | ICD-10-CM | POA: Diagnosis not present

## 2020-03-03 DIAGNOSIS — E8779 Other fluid overload: Secondary | ICD-10-CM | POA: Diagnosis not present

## 2020-03-03 DIAGNOSIS — Z992 Dependence on renal dialysis: Secondary | ICD-10-CM | POA: Diagnosis not present

## 2020-03-06 DIAGNOSIS — D509 Iron deficiency anemia, unspecified: Secondary | ICD-10-CM | POA: Diagnosis not present

## 2020-03-06 DIAGNOSIS — R110 Nausea: Secondary | ICD-10-CM | POA: Diagnosis not present

## 2020-03-06 DIAGNOSIS — E8779 Other fluid overload: Secondary | ICD-10-CM | POA: Diagnosis not present

## 2020-03-06 DIAGNOSIS — N186 End stage renal disease: Secondary | ICD-10-CM | POA: Diagnosis not present

## 2020-03-06 DIAGNOSIS — Z992 Dependence on renal dialysis: Secondary | ICD-10-CM | POA: Diagnosis not present

## 2020-03-06 DIAGNOSIS — D631 Anemia in chronic kidney disease: Secondary | ICD-10-CM | POA: Diagnosis not present

## 2020-03-08 DIAGNOSIS — Z992 Dependence on renal dialysis: Secondary | ICD-10-CM | POA: Diagnosis not present

## 2020-03-08 DIAGNOSIS — N186 End stage renal disease: Secondary | ICD-10-CM | POA: Diagnosis not present

## 2020-03-08 DIAGNOSIS — E8779 Other fluid overload: Secondary | ICD-10-CM | POA: Diagnosis not present

## 2020-03-08 DIAGNOSIS — D631 Anemia in chronic kidney disease: Secondary | ICD-10-CM | POA: Diagnosis not present

## 2020-03-08 DIAGNOSIS — D509 Iron deficiency anemia, unspecified: Secondary | ICD-10-CM | POA: Diagnosis not present

## 2020-03-08 DIAGNOSIS — R110 Nausea: Secondary | ICD-10-CM | POA: Diagnosis not present

## 2020-03-10 DIAGNOSIS — R110 Nausea: Secondary | ICD-10-CM | POA: Diagnosis not present

## 2020-03-10 DIAGNOSIS — N186 End stage renal disease: Secondary | ICD-10-CM | POA: Diagnosis not present

## 2020-03-10 DIAGNOSIS — E8779 Other fluid overload: Secondary | ICD-10-CM | POA: Diagnosis not present

## 2020-03-10 DIAGNOSIS — D509 Iron deficiency anemia, unspecified: Secondary | ICD-10-CM | POA: Diagnosis not present

## 2020-03-10 DIAGNOSIS — Z992 Dependence on renal dialysis: Secondary | ICD-10-CM | POA: Diagnosis not present

## 2020-03-10 DIAGNOSIS — D631 Anemia in chronic kidney disease: Secondary | ICD-10-CM | POA: Diagnosis not present

## 2020-03-13 DIAGNOSIS — Z992 Dependence on renal dialysis: Secondary | ICD-10-CM | POA: Diagnosis not present

## 2020-03-13 DIAGNOSIS — E8779 Other fluid overload: Secondary | ICD-10-CM | POA: Diagnosis not present

## 2020-03-13 DIAGNOSIS — R110 Nausea: Secondary | ICD-10-CM | POA: Diagnosis not present

## 2020-03-13 DIAGNOSIS — D631 Anemia in chronic kidney disease: Secondary | ICD-10-CM | POA: Diagnosis not present

## 2020-03-13 DIAGNOSIS — D509 Iron deficiency anemia, unspecified: Secondary | ICD-10-CM | POA: Diagnosis not present

## 2020-03-13 DIAGNOSIS — N186 End stage renal disease: Secondary | ICD-10-CM | POA: Diagnosis not present

## 2020-03-15 DIAGNOSIS — Z992 Dependence on renal dialysis: Secondary | ICD-10-CM | POA: Diagnosis not present

## 2020-03-15 DIAGNOSIS — N186 End stage renal disease: Secondary | ICD-10-CM | POA: Diagnosis not present

## 2020-03-15 DIAGNOSIS — D631 Anemia in chronic kidney disease: Secondary | ICD-10-CM | POA: Diagnosis not present

## 2020-03-15 DIAGNOSIS — R110 Nausea: Secondary | ICD-10-CM | POA: Diagnosis not present

## 2020-03-15 DIAGNOSIS — D509 Iron deficiency anemia, unspecified: Secondary | ICD-10-CM | POA: Diagnosis not present

## 2020-03-15 DIAGNOSIS — E8779 Other fluid overload: Secondary | ICD-10-CM | POA: Diagnosis not present

## 2020-03-17 DIAGNOSIS — D631 Anemia in chronic kidney disease: Secondary | ICD-10-CM | POA: Diagnosis not present

## 2020-03-17 DIAGNOSIS — D509 Iron deficiency anemia, unspecified: Secondary | ICD-10-CM | POA: Diagnosis not present

## 2020-03-17 DIAGNOSIS — E8779 Other fluid overload: Secondary | ICD-10-CM | POA: Diagnosis not present

## 2020-03-17 DIAGNOSIS — R110 Nausea: Secondary | ICD-10-CM | POA: Diagnosis not present

## 2020-03-17 DIAGNOSIS — Z992 Dependence on renal dialysis: Secondary | ICD-10-CM | POA: Diagnosis not present

## 2020-03-17 DIAGNOSIS — N186 End stage renal disease: Secondary | ICD-10-CM | POA: Diagnosis not present

## 2020-03-20 DIAGNOSIS — D509 Iron deficiency anemia, unspecified: Secondary | ICD-10-CM | POA: Diagnosis not present

## 2020-03-20 DIAGNOSIS — E8779 Other fluid overload: Secondary | ICD-10-CM | POA: Diagnosis not present

## 2020-03-20 DIAGNOSIS — N186 End stage renal disease: Secondary | ICD-10-CM | POA: Diagnosis not present

## 2020-03-20 DIAGNOSIS — D631 Anemia in chronic kidney disease: Secondary | ICD-10-CM | POA: Diagnosis not present

## 2020-03-20 DIAGNOSIS — R110 Nausea: Secondary | ICD-10-CM | POA: Diagnosis not present

## 2020-03-20 DIAGNOSIS — Z992 Dependence on renal dialysis: Secondary | ICD-10-CM | POA: Diagnosis not present

## 2020-03-22 DIAGNOSIS — Z992 Dependence on renal dialysis: Secondary | ICD-10-CM | POA: Diagnosis not present

## 2020-03-22 DIAGNOSIS — E8779 Other fluid overload: Secondary | ICD-10-CM | POA: Diagnosis not present

## 2020-03-22 DIAGNOSIS — D509 Iron deficiency anemia, unspecified: Secondary | ICD-10-CM | POA: Diagnosis not present

## 2020-03-22 DIAGNOSIS — D631 Anemia in chronic kidney disease: Secondary | ICD-10-CM | POA: Diagnosis not present

## 2020-03-22 DIAGNOSIS — R110 Nausea: Secondary | ICD-10-CM | POA: Diagnosis not present

## 2020-03-22 DIAGNOSIS — N186 End stage renal disease: Secondary | ICD-10-CM | POA: Diagnosis not present

## 2020-03-23 DIAGNOSIS — Z23 Encounter for immunization: Secondary | ICD-10-CM | POA: Diagnosis not present

## 2020-03-26 DIAGNOSIS — Z992 Dependence on renal dialysis: Secondary | ICD-10-CM | POA: Diagnosis not present

## 2020-03-26 DIAGNOSIS — E8779 Other fluid overload: Secondary | ICD-10-CM | POA: Diagnosis not present

## 2020-03-26 DIAGNOSIS — D509 Iron deficiency anemia, unspecified: Secondary | ICD-10-CM | POA: Diagnosis not present

## 2020-03-26 DIAGNOSIS — N186 End stage renal disease: Secondary | ICD-10-CM | POA: Diagnosis not present

## 2020-03-26 DIAGNOSIS — R110 Nausea: Secondary | ICD-10-CM | POA: Diagnosis not present

## 2020-03-26 DIAGNOSIS — D631 Anemia in chronic kidney disease: Secondary | ICD-10-CM | POA: Diagnosis not present

## 2020-03-27 DIAGNOSIS — Z992 Dependence on renal dialysis: Secondary | ICD-10-CM | POA: Diagnosis not present

## 2020-03-27 DIAGNOSIS — D631 Anemia in chronic kidney disease: Secondary | ICD-10-CM | POA: Diagnosis not present

## 2020-03-27 DIAGNOSIS — E8779 Other fluid overload: Secondary | ICD-10-CM | POA: Diagnosis not present

## 2020-03-27 DIAGNOSIS — N186 End stage renal disease: Secondary | ICD-10-CM | POA: Diagnosis not present

## 2020-03-27 DIAGNOSIS — R110 Nausea: Secondary | ICD-10-CM | POA: Diagnosis not present

## 2020-03-27 DIAGNOSIS — D509 Iron deficiency anemia, unspecified: Secondary | ICD-10-CM | POA: Diagnosis not present

## 2020-03-29 DIAGNOSIS — Z992 Dependence on renal dialysis: Secondary | ICD-10-CM | POA: Diagnosis not present

## 2020-03-29 DIAGNOSIS — E8779 Other fluid overload: Secondary | ICD-10-CM | POA: Diagnosis not present

## 2020-03-29 DIAGNOSIS — R110 Nausea: Secondary | ICD-10-CM | POA: Diagnosis not present

## 2020-03-29 DIAGNOSIS — N186 End stage renal disease: Secondary | ICD-10-CM | POA: Diagnosis not present

## 2020-03-29 DIAGNOSIS — D509 Iron deficiency anemia, unspecified: Secondary | ICD-10-CM | POA: Diagnosis not present

## 2020-03-29 DIAGNOSIS — D631 Anemia in chronic kidney disease: Secondary | ICD-10-CM | POA: Diagnosis not present

## 2020-03-30 DIAGNOSIS — N186 End stage renal disease: Secondary | ICD-10-CM | POA: Diagnosis not present

## 2020-03-30 DIAGNOSIS — I120 Hypertensive chronic kidney disease with stage 5 chronic kidney disease or end stage renal disease: Secondary | ICD-10-CM | POA: Diagnosis not present

## 2020-03-30 DIAGNOSIS — Z992 Dependence on renal dialysis: Secondary | ICD-10-CM | POA: Diagnosis not present

## 2020-03-31 DIAGNOSIS — D631 Anemia in chronic kidney disease: Secondary | ICD-10-CM | POA: Diagnosis not present

## 2020-03-31 DIAGNOSIS — E8779 Other fluid overload: Secondary | ICD-10-CM | POA: Diagnosis not present

## 2020-03-31 DIAGNOSIS — N2581 Secondary hyperparathyroidism of renal origin: Secondary | ICD-10-CM | POA: Diagnosis not present

## 2020-03-31 DIAGNOSIS — E669 Obesity, unspecified: Secondary | ICD-10-CM | POA: Diagnosis not present

## 2020-03-31 DIAGNOSIS — N186 End stage renal disease: Secondary | ICD-10-CM | POA: Diagnosis not present

## 2020-03-31 DIAGNOSIS — G2581 Restless legs syndrome: Secondary | ICD-10-CM | POA: Diagnosis not present

## 2020-03-31 DIAGNOSIS — D509 Iron deficiency anemia, unspecified: Secondary | ICD-10-CM | POA: Diagnosis not present

## 2020-03-31 DIAGNOSIS — Z992 Dependence on renal dialysis: Secondary | ICD-10-CM | POA: Diagnosis not present

## 2020-04-02 DIAGNOSIS — Z992 Dependence on renal dialysis: Secondary | ICD-10-CM | POA: Diagnosis not present

## 2020-04-02 DIAGNOSIS — N186 End stage renal disease: Secondary | ICD-10-CM | POA: Diagnosis not present

## 2020-04-02 DIAGNOSIS — D631 Anemia in chronic kidney disease: Secondary | ICD-10-CM | POA: Diagnosis not present

## 2020-04-02 DIAGNOSIS — N2581 Secondary hyperparathyroidism of renal origin: Secondary | ICD-10-CM | POA: Diagnosis not present

## 2020-04-02 DIAGNOSIS — E8779 Other fluid overload: Secondary | ICD-10-CM | POA: Diagnosis not present

## 2020-04-02 DIAGNOSIS — D509 Iron deficiency anemia, unspecified: Secondary | ICD-10-CM | POA: Diagnosis not present

## 2020-04-03 DIAGNOSIS — N186 End stage renal disease: Secondary | ICD-10-CM | POA: Diagnosis not present

## 2020-04-03 DIAGNOSIS — D631 Anemia in chronic kidney disease: Secondary | ICD-10-CM | POA: Diagnosis not present

## 2020-04-03 DIAGNOSIS — N2581 Secondary hyperparathyroidism of renal origin: Secondary | ICD-10-CM | POA: Diagnosis not present

## 2020-04-03 DIAGNOSIS — E8779 Other fluid overload: Secondary | ICD-10-CM | POA: Diagnosis not present

## 2020-04-03 DIAGNOSIS — Z992 Dependence on renal dialysis: Secondary | ICD-10-CM | POA: Diagnosis not present

## 2020-04-03 DIAGNOSIS — D509 Iron deficiency anemia, unspecified: Secondary | ICD-10-CM | POA: Diagnosis not present

## 2020-04-05 DIAGNOSIS — N186 End stage renal disease: Secondary | ICD-10-CM | POA: Diagnosis not present

## 2020-04-05 DIAGNOSIS — Z992 Dependence on renal dialysis: Secondary | ICD-10-CM | POA: Diagnosis not present

## 2020-04-05 DIAGNOSIS — D631 Anemia in chronic kidney disease: Secondary | ICD-10-CM | POA: Diagnosis not present

## 2020-04-05 DIAGNOSIS — E8779 Other fluid overload: Secondary | ICD-10-CM | POA: Diagnosis not present

## 2020-04-05 DIAGNOSIS — N2581 Secondary hyperparathyroidism of renal origin: Secondary | ICD-10-CM | POA: Diagnosis not present

## 2020-04-05 DIAGNOSIS — D509 Iron deficiency anemia, unspecified: Secondary | ICD-10-CM | POA: Diagnosis not present

## 2020-04-07 DIAGNOSIS — N186 End stage renal disease: Secondary | ICD-10-CM | POA: Diagnosis not present

## 2020-04-07 DIAGNOSIS — N2581 Secondary hyperparathyroidism of renal origin: Secondary | ICD-10-CM | POA: Diagnosis not present

## 2020-04-07 DIAGNOSIS — D631 Anemia in chronic kidney disease: Secondary | ICD-10-CM | POA: Diagnosis not present

## 2020-04-07 DIAGNOSIS — E8779 Other fluid overload: Secondary | ICD-10-CM | POA: Diagnosis not present

## 2020-04-07 DIAGNOSIS — D509 Iron deficiency anemia, unspecified: Secondary | ICD-10-CM | POA: Diagnosis not present

## 2020-04-07 DIAGNOSIS — Z992 Dependence on renal dialysis: Secondary | ICD-10-CM | POA: Diagnosis not present

## 2020-04-10 DIAGNOSIS — R0600 Dyspnea, unspecified: Secondary | ICD-10-CM | POA: Diagnosis not present

## 2020-04-10 DIAGNOSIS — E669 Obesity, unspecified: Secondary | ICD-10-CM | POA: Diagnosis not present

## 2020-04-10 DIAGNOSIS — Z03818 Encounter for observation for suspected exposure to other biological agents ruled out: Secondary | ICD-10-CM | POA: Diagnosis not present

## 2020-04-10 DIAGNOSIS — Z6841 Body Mass Index (BMI) 40.0 and over, adult: Secondary | ICD-10-CM | POA: Diagnosis not present

## 2020-04-10 DIAGNOSIS — Z20822 Contact with and (suspected) exposure to covid-19: Secondary | ICD-10-CM | POA: Diagnosis not present

## 2020-04-10 DIAGNOSIS — R600 Localized edema: Secondary | ICD-10-CM | POA: Diagnosis not present

## 2020-04-10 DIAGNOSIS — R0603 Acute respiratory distress: Secondary | ICD-10-CM | POA: Diagnosis not present

## 2020-04-10 DIAGNOSIS — E8770 Fluid overload, unspecified: Secondary | ICD-10-CM | POA: Diagnosis not present

## 2020-04-10 DIAGNOSIS — E875 Hyperkalemia: Secondary | ICD-10-CM | POA: Diagnosis not present

## 2020-04-10 DIAGNOSIS — I120 Hypertensive chronic kidney disease with stage 5 chronic kidney disease or end stage renal disease: Secondary | ICD-10-CM | POA: Diagnosis not present

## 2020-04-10 DIAGNOSIS — Z992 Dependence on renal dialysis: Secondary | ICD-10-CM | POA: Diagnosis not present

## 2020-04-10 DIAGNOSIS — K644 Residual hemorrhoidal skin tags: Secondary | ICD-10-CM | POA: Diagnosis not present

## 2020-04-10 DIAGNOSIS — N186 End stage renal disease: Secondary | ICD-10-CM | POA: Diagnosis not present

## 2020-04-10 DIAGNOSIS — Z79899 Other long term (current) drug therapy: Secondary | ICD-10-CM | POA: Diagnosis not present

## 2020-04-12 DIAGNOSIS — D631 Anemia in chronic kidney disease: Secondary | ICD-10-CM | POA: Diagnosis not present

## 2020-04-12 DIAGNOSIS — N186 End stage renal disease: Secondary | ICD-10-CM | POA: Diagnosis not present

## 2020-04-12 DIAGNOSIS — Z992 Dependence on renal dialysis: Secondary | ICD-10-CM | POA: Diagnosis not present

## 2020-04-12 DIAGNOSIS — D509 Iron deficiency anemia, unspecified: Secondary | ICD-10-CM | POA: Diagnosis not present

## 2020-04-12 DIAGNOSIS — E8779 Other fluid overload: Secondary | ICD-10-CM | POA: Diagnosis not present

## 2020-04-12 DIAGNOSIS — N2581 Secondary hyperparathyroidism of renal origin: Secondary | ICD-10-CM | POA: Diagnosis not present

## 2020-04-14 DIAGNOSIS — N186 End stage renal disease: Secondary | ICD-10-CM | POA: Diagnosis not present

## 2020-04-14 DIAGNOSIS — D509 Iron deficiency anemia, unspecified: Secondary | ICD-10-CM | POA: Diagnosis not present

## 2020-04-14 DIAGNOSIS — E8779 Other fluid overload: Secondary | ICD-10-CM | POA: Diagnosis not present

## 2020-04-14 DIAGNOSIS — Z992 Dependence on renal dialysis: Secondary | ICD-10-CM | POA: Diagnosis not present

## 2020-04-14 DIAGNOSIS — N2581 Secondary hyperparathyroidism of renal origin: Secondary | ICD-10-CM | POA: Diagnosis not present

## 2020-04-14 DIAGNOSIS — D631 Anemia in chronic kidney disease: Secondary | ICD-10-CM | POA: Diagnosis not present

## 2020-04-16 DIAGNOSIS — Z1339 Encounter for screening examination for other mental health and behavioral disorders: Secondary | ICD-10-CM | POA: Diagnosis not present

## 2020-04-16 DIAGNOSIS — Z1331 Encounter for screening for depression: Secondary | ICD-10-CM | POA: Diagnosis not present

## 2020-04-16 DIAGNOSIS — D509 Iron deficiency anemia, unspecified: Secondary | ICD-10-CM | POA: Diagnosis not present

## 2020-04-16 DIAGNOSIS — D631 Anemia in chronic kidney disease: Secondary | ICD-10-CM | POA: Diagnosis not present

## 2020-04-16 DIAGNOSIS — Z992 Dependence on renal dialysis: Secondary | ICD-10-CM | POA: Diagnosis not present

## 2020-04-16 DIAGNOSIS — Z0000 Encounter for general adult medical examination without abnormal findings: Secondary | ICD-10-CM | POA: Diagnosis not present

## 2020-04-16 DIAGNOSIS — Z139 Encounter for screening, unspecified: Secondary | ICD-10-CM | POA: Diagnosis not present

## 2020-04-16 DIAGNOSIS — N186 End stage renal disease: Secondary | ICD-10-CM | POA: Diagnosis not present

## 2020-04-16 DIAGNOSIS — L309 Dermatitis, unspecified: Secondary | ICD-10-CM | POA: Diagnosis not present

## 2020-04-16 DIAGNOSIS — Z23 Encounter for immunization: Secondary | ICD-10-CM | POA: Diagnosis not present

## 2020-04-16 DIAGNOSIS — N2581 Secondary hyperparathyroidism of renal origin: Secondary | ICD-10-CM | POA: Diagnosis not present

## 2020-04-16 DIAGNOSIS — E8779 Other fluid overload: Secondary | ICD-10-CM | POA: Diagnosis not present

## 2020-04-16 DIAGNOSIS — Z7689 Persons encountering health services in other specified circumstances: Secondary | ICD-10-CM | POA: Diagnosis not present

## 2020-04-16 DIAGNOSIS — K648 Other hemorrhoids: Secondary | ICD-10-CM | POA: Diagnosis not present

## 2020-04-17 DIAGNOSIS — D631 Anemia in chronic kidney disease: Secondary | ICD-10-CM | POA: Diagnosis not present

## 2020-04-17 DIAGNOSIS — N2581 Secondary hyperparathyroidism of renal origin: Secondary | ICD-10-CM | POA: Diagnosis not present

## 2020-04-17 DIAGNOSIS — D509 Iron deficiency anemia, unspecified: Secondary | ICD-10-CM | POA: Diagnosis not present

## 2020-04-17 DIAGNOSIS — N186 End stage renal disease: Secondary | ICD-10-CM | POA: Diagnosis not present

## 2020-04-17 DIAGNOSIS — E8779 Other fluid overload: Secondary | ICD-10-CM | POA: Diagnosis not present

## 2020-04-17 DIAGNOSIS — Z992 Dependence on renal dialysis: Secondary | ICD-10-CM | POA: Diagnosis not present

## 2020-04-19 DIAGNOSIS — D509 Iron deficiency anemia, unspecified: Secondary | ICD-10-CM | POA: Diagnosis not present

## 2020-04-19 DIAGNOSIS — K648 Other hemorrhoids: Secondary | ICD-10-CM | POA: Diagnosis not present

## 2020-04-19 DIAGNOSIS — Z992 Dependence on renal dialysis: Secondary | ICD-10-CM | POA: Diagnosis not present

## 2020-04-19 DIAGNOSIS — K644 Residual hemorrhoidal skin tags: Secondary | ICD-10-CM | POA: Diagnosis not present

## 2020-04-19 DIAGNOSIS — N2581 Secondary hyperparathyroidism of renal origin: Secondary | ICD-10-CM | POA: Diagnosis not present

## 2020-04-19 DIAGNOSIS — R0989 Other specified symptoms and signs involving the circulatory and respiratory systems: Secondary | ICD-10-CM | POA: Diagnosis not present

## 2020-04-19 DIAGNOSIS — D631 Anemia in chronic kidney disease: Secondary | ICD-10-CM | POA: Diagnosis not present

## 2020-04-19 DIAGNOSIS — Z6841 Body Mass Index (BMI) 40.0 and over, adult: Secondary | ICD-10-CM | POA: Diagnosis not present

## 2020-04-19 DIAGNOSIS — I120 Hypertensive chronic kidney disease with stage 5 chronic kidney disease or end stage renal disease: Secondary | ICD-10-CM | POA: Diagnosis not present

## 2020-04-19 DIAGNOSIS — Z0389 Encounter for observation for other suspected diseases and conditions ruled out: Secondary | ICD-10-CM | POA: Diagnosis not present

## 2020-04-19 DIAGNOSIS — I959 Hypotension, unspecified: Secondary | ICD-10-CM | POA: Diagnosis not present

## 2020-04-19 DIAGNOSIS — K760 Fatty (change of) liver, not elsewhere classified: Secondary | ICD-10-CM | POA: Diagnosis not present

## 2020-04-19 DIAGNOSIS — R160 Hepatomegaly, not elsewhere classified: Secondary | ICD-10-CM | POA: Diagnosis not present

## 2020-04-19 DIAGNOSIS — N186 End stage renal disease: Secondary | ICD-10-CM | POA: Diagnosis not present

## 2020-04-19 DIAGNOSIS — R0602 Shortness of breath: Secondary | ICD-10-CM | POA: Diagnosis not present

## 2020-04-19 DIAGNOSIS — Z9049 Acquired absence of other specified parts of digestive tract: Secondary | ICD-10-CM | POA: Diagnosis not present

## 2020-04-19 DIAGNOSIS — E8779 Other fluid overload: Secondary | ICD-10-CM | POA: Diagnosis not present

## 2020-04-19 DIAGNOSIS — R0789 Other chest pain: Secondary | ICD-10-CM | POA: Diagnosis not present

## 2020-04-19 DIAGNOSIS — R600 Localized edema: Secondary | ICD-10-CM | POA: Diagnosis not present

## 2020-04-19 DIAGNOSIS — Z5320 Procedure and treatment not carried out because of patient's decision for unspecified reasons: Secondary | ICD-10-CM | POA: Diagnosis not present

## 2020-04-19 DIAGNOSIS — R079 Chest pain, unspecified: Secondary | ICD-10-CM | POA: Diagnosis not present

## 2020-04-21 DIAGNOSIS — D631 Anemia in chronic kidney disease: Secondary | ICD-10-CM | POA: Diagnosis not present

## 2020-04-21 DIAGNOSIS — N2581 Secondary hyperparathyroidism of renal origin: Secondary | ICD-10-CM | POA: Diagnosis not present

## 2020-04-21 DIAGNOSIS — Z992 Dependence on renal dialysis: Secondary | ICD-10-CM | POA: Diagnosis not present

## 2020-04-21 DIAGNOSIS — E8779 Other fluid overload: Secondary | ICD-10-CM | POA: Diagnosis not present

## 2020-04-21 DIAGNOSIS — N186 End stage renal disease: Secondary | ICD-10-CM | POA: Diagnosis not present

## 2020-04-21 DIAGNOSIS — D509 Iron deficiency anemia, unspecified: Secondary | ICD-10-CM | POA: Diagnosis not present

## 2020-04-23 DIAGNOSIS — N2581 Secondary hyperparathyroidism of renal origin: Secondary | ICD-10-CM | POA: Diagnosis not present

## 2020-04-23 DIAGNOSIS — Z992 Dependence on renal dialysis: Secondary | ICD-10-CM | POA: Diagnosis not present

## 2020-04-23 DIAGNOSIS — E8779 Other fluid overload: Secondary | ICD-10-CM | POA: Diagnosis not present

## 2020-04-23 DIAGNOSIS — N186 End stage renal disease: Secondary | ICD-10-CM | POA: Diagnosis not present

## 2020-04-23 DIAGNOSIS — D509 Iron deficiency anemia, unspecified: Secondary | ICD-10-CM | POA: Diagnosis not present

## 2020-04-23 DIAGNOSIS — D631 Anemia in chronic kidney disease: Secondary | ICD-10-CM | POA: Diagnosis not present

## 2020-04-24 DIAGNOSIS — K644 Residual hemorrhoidal skin tags: Secondary | ICD-10-CM | POA: Diagnosis not present

## 2020-04-24 DIAGNOSIS — L98499 Non-pressure chronic ulcer of skin of other sites with unspecified severity: Secondary | ICD-10-CM | POA: Diagnosis not present

## 2020-04-24 DIAGNOSIS — K648 Other hemorrhoids: Secondary | ICD-10-CM | POA: Diagnosis not present

## 2020-04-24 DIAGNOSIS — K602 Anal fissure, unspecified: Secondary | ICD-10-CM | POA: Diagnosis not present

## 2020-04-26 DIAGNOSIS — D631 Anemia in chronic kidney disease: Secondary | ICD-10-CM | POA: Diagnosis not present

## 2020-04-26 DIAGNOSIS — N2581 Secondary hyperparathyroidism of renal origin: Secondary | ICD-10-CM | POA: Diagnosis not present

## 2020-04-26 DIAGNOSIS — R059 Cough, unspecified: Secondary | ICD-10-CM | POA: Diagnosis not present

## 2020-04-26 DIAGNOSIS — E8779 Other fluid overload: Secondary | ICD-10-CM | POA: Diagnosis not present

## 2020-04-26 DIAGNOSIS — N186 End stage renal disease: Secondary | ICD-10-CM | POA: Diagnosis not present

## 2020-04-26 DIAGNOSIS — D509 Iron deficiency anemia, unspecified: Secondary | ICD-10-CM | POA: Diagnosis not present

## 2020-04-26 DIAGNOSIS — Z992 Dependence on renal dialysis: Secondary | ICD-10-CM | POA: Diagnosis not present

## 2020-04-27 DIAGNOSIS — Z79899 Other long term (current) drug therapy: Secondary | ICD-10-CM | POA: Diagnosis not present

## 2020-04-27 DIAGNOSIS — E8779 Other fluid overload: Secondary | ICD-10-CM | POA: Diagnosis not present

## 2020-04-27 DIAGNOSIS — Z6841 Body Mass Index (BMI) 40.0 and over, adult: Secondary | ICD-10-CM | POA: Diagnosis not present

## 2020-04-27 DIAGNOSIS — I10 Essential (primary) hypertension: Secondary | ICD-10-CM | POA: Diagnosis not present

## 2020-04-27 DIAGNOSIS — R000 Tachycardia, unspecified: Secondary | ICD-10-CM | POA: Diagnosis not present

## 2020-04-27 DIAGNOSIS — D631 Anemia in chronic kidney disease: Secondary | ICD-10-CM | POA: Diagnosis not present

## 2020-04-27 DIAGNOSIS — Z20822 Contact with and (suspected) exposure to covid-19: Secondary | ICD-10-CM | POA: Diagnosis not present

## 2020-04-27 DIAGNOSIS — R404 Transient alteration of awareness: Secondary | ICD-10-CM | POA: Diagnosis not present

## 2020-04-27 DIAGNOSIS — R0902 Hypoxemia: Secondary | ICD-10-CM | POA: Diagnosis not present

## 2020-04-27 DIAGNOSIS — R079 Chest pain, unspecified: Secondary | ICD-10-CM | POA: Diagnosis not present

## 2020-04-27 DIAGNOSIS — Z86711 Personal history of pulmonary embolism: Secondary | ICD-10-CM | POA: Diagnosis not present

## 2020-04-27 DIAGNOSIS — Z03818 Encounter for observation for suspected exposure to other biological agents ruled out: Secondary | ICD-10-CM | POA: Diagnosis not present

## 2020-04-27 DIAGNOSIS — Z992 Dependence on renal dialysis: Secondary | ICD-10-CM | POA: Diagnosis not present

## 2020-04-27 DIAGNOSIS — R531 Weakness: Secondary | ICD-10-CM | POA: Diagnosis not present

## 2020-04-27 DIAGNOSIS — R600 Localized edema: Secondary | ICD-10-CM | POA: Diagnosis not present

## 2020-04-27 DIAGNOSIS — K219 Gastro-esophageal reflux disease without esophagitis: Secondary | ICD-10-CM | POA: Diagnosis not present

## 2020-04-27 DIAGNOSIS — R55 Syncope and collapse: Secondary | ICD-10-CM | POA: Diagnosis not present

## 2020-04-27 DIAGNOSIS — D509 Iron deficiency anemia, unspecified: Secondary | ICD-10-CM | POA: Diagnosis not present

## 2020-04-27 DIAGNOSIS — N2581 Secondary hyperparathyroidism of renal origin: Secondary | ICD-10-CM | POA: Diagnosis not present

## 2020-04-27 DIAGNOSIS — N390 Urinary tract infection, site not specified: Secondary | ICD-10-CM | POA: Diagnosis not present

## 2020-04-27 DIAGNOSIS — R4020 Unspecified coma: Secondary | ICD-10-CM | POA: Diagnosis not present

## 2020-04-27 DIAGNOSIS — N186 End stage renal disease: Secondary | ICD-10-CM | POA: Diagnosis not present

## 2020-04-27 DIAGNOSIS — I120 Hypertensive chronic kidney disease with stage 5 chronic kidney disease or end stage renal disease: Secondary | ICD-10-CM | POA: Diagnosis not present

## 2020-04-28 DIAGNOSIS — N186 End stage renal disease: Secondary | ICD-10-CM | POA: Diagnosis not present

## 2020-04-28 DIAGNOSIS — I10 Essential (primary) hypertension: Secondary | ICD-10-CM | POA: Diagnosis not present

## 2020-04-28 DIAGNOSIS — R55 Syncope and collapse: Secondary | ICD-10-CM | POA: Diagnosis not present

## 2020-04-28 DIAGNOSIS — N390 Urinary tract infection, site not specified: Secondary | ICD-10-CM | POA: Diagnosis not present

## 2020-04-28 DIAGNOSIS — K219 Gastro-esophageal reflux disease without esophagitis: Secondary | ICD-10-CM | POA: Diagnosis not present

## 2020-04-28 DIAGNOSIS — Z86711 Personal history of pulmonary embolism: Secondary | ICD-10-CM | POA: Diagnosis not present

## 2020-04-28 DIAGNOSIS — Z992 Dependence on renal dialysis: Secondary | ICD-10-CM | POA: Diagnosis not present

## 2020-04-28 DIAGNOSIS — I6529 Occlusion and stenosis of unspecified carotid artery: Secondary | ICD-10-CM | POA: Diagnosis not present

## 2020-04-30 DIAGNOSIS — N186 End stage renal disease: Secondary | ICD-10-CM | POA: Diagnosis not present

## 2020-04-30 DIAGNOSIS — Z992 Dependence on renal dialysis: Secondary | ICD-10-CM | POA: Diagnosis not present

## 2020-05-01 DIAGNOSIS — L03115 Cellulitis of right lower limb: Secondary | ICD-10-CM | POA: Diagnosis not present

## 2020-05-01 DIAGNOSIS — D509 Iron deficiency anemia, unspecified: Secondary | ICD-10-CM | POA: Diagnosis not present

## 2020-05-01 DIAGNOSIS — L03116 Cellulitis of left lower limb: Secondary | ICD-10-CM | POA: Diagnosis not present

## 2020-05-01 DIAGNOSIS — N186 End stage renal disease: Secondary | ICD-10-CM | POA: Diagnosis not present

## 2020-05-01 DIAGNOSIS — I509 Heart failure, unspecified: Secondary | ICD-10-CM | POA: Diagnosis not present

## 2020-05-01 DIAGNOSIS — Z992 Dependence on renal dialysis: Secondary | ICD-10-CM | POA: Diagnosis not present

## 2020-05-01 DIAGNOSIS — N2581 Secondary hyperparathyroidism of renal origin: Secondary | ICD-10-CM | POA: Diagnosis not present

## 2020-05-01 DIAGNOSIS — E8779 Other fluid overload: Secondary | ICD-10-CM | POA: Diagnosis not present

## 2020-05-01 DIAGNOSIS — D631 Anemia in chronic kidney disease: Secondary | ICD-10-CM | POA: Diagnosis not present

## 2020-05-03 DIAGNOSIS — Z992 Dependence on renal dialysis: Secondary | ICD-10-CM | POA: Diagnosis not present

## 2020-05-03 DIAGNOSIS — N2581 Secondary hyperparathyroidism of renal origin: Secondary | ICD-10-CM | POA: Diagnosis not present

## 2020-05-03 DIAGNOSIS — D509 Iron deficiency anemia, unspecified: Secondary | ICD-10-CM | POA: Diagnosis not present

## 2020-05-03 DIAGNOSIS — I509 Heart failure, unspecified: Secondary | ICD-10-CM | POA: Diagnosis not present

## 2020-05-03 DIAGNOSIS — D631 Anemia in chronic kidney disease: Secondary | ICD-10-CM | POA: Diagnosis not present

## 2020-05-03 DIAGNOSIS — N186 End stage renal disease: Secondary | ICD-10-CM | POA: Diagnosis not present

## 2020-05-04 DIAGNOSIS — I509 Heart failure, unspecified: Secondary | ICD-10-CM | POA: Diagnosis not present

## 2020-05-04 DIAGNOSIS — Z992 Dependence on renal dialysis: Secondary | ICD-10-CM | POA: Diagnosis not present

## 2020-05-04 DIAGNOSIS — D509 Iron deficiency anemia, unspecified: Secondary | ICD-10-CM | POA: Diagnosis not present

## 2020-05-04 DIAGNOSIS — N2581 Secondary hyperparathyroidism of renal origin: Secondary | ICD-10-CM | POA: Diagnosis not present

## 2020-05-04 DIAGNOSIS — D631 Anemia in chronic kidney disease: Secondary | ICD-10-CM | POA: Diagnosis not present

## 2020-05-04 DIAGNOSIS — N186 End stage renal disease: Secondary | ICD-10-CM | POA: Diagnosis not present

## 2020-05-05 DIAGNOSIS — N2581 Secondary hyperparathyroidism of renal origin: Secondary | ICD-10-CM | POA: Diagnosis not present

## 2020-05-05 DIAGNOSIS — Z992 Dependence on renal dialysis: Secondary | ICD-10-CM | POA: Diagnosis not present

## 2020-05-05 DIAGNOSIS — D509 Iron deficiency anemia, unspecified: Secondary | ICD-10-CM | POA: Diagnosis not present

## 2020-05-05 DIAGNOSIS — N186 End stage renal disease: Secondary | ICD-10-CM | POA: Diagnosis not present

## 2020-05-05 DIAGNOSIS — D631 Anemia in chronic kidney disease: Secondary | ICD-10-CM | POA: Diagnosis not present

## 2020-05-05 DIAGNOSIS — I509 Heart failure, unspecified: Secondary | ICD-10-CM | POA: Diagnosis not present

## 2020-05-08 DIAGNOSIS — N2581 Secondary hyperparathyroidism of renal origin: Secondary | ICD-10-CM | POA: Diagnosis not present

## 2020-05-08 DIAGNOSIS — I10 Essential (primary) hypertension: Secondary | ICD-10-CM | POA: Diagnosis not present

## 2020-05-08 DIAGNOSIS — N186 End stage renal disease: Secondary | ICD-10-CM | POA: Diagnosis not present

## 2020-05-08 DIAGNOSIS — I509 Heart failure, unspecified: Secondary | ICD-10-CM | POA: Diagnosis not present

## 2020-05-08 DIAGNOSIS — R0789 Other chest pain: Secondary | ICD-10-CM | POA: Diagnosis not present

## 2020-05-08 DIAGNOSIS — R000 Tachycardia, unspecified: Secondary | ICD-10-CM | POA: Diagnosis not present

## 2020-05-08 DIAGNOSIS — Z992 Dependence on renal dialysis: Secondary | ICD-10-CM | POA: Diagnosis not present

## 2020-05-08 DIAGNOSIS — D509 Iron deficiency anemia, unspecified: Secondary | ICD-10-CM | POA: Diagnosis not present

## 2020-05-08 DIAGNOSIS — Z5321 Procedure and treatment not carried out due to patient leaving prior to being seen by health care provider: Secondary | ICD-10-CM | POA: Diagnosis not present

## 2020-05-08 DIAGNOSIS — R0902 Hypoxemia: Secondary | ICD-10-CM | POA: Diagnosis not present

## 2020-05-08 DIAGNOSIS — D631 Anemia in chronic kidney disease: Secondary | ICD-10-CM | POA: Diagnosis not present

## 2020-05-08 DIAGNOSIS — R079 Chest pain, unspecified: Secondary | ICD-10-CM | POA: Diagnosis not present

## 2020-05-10 ENCOUNTER — Emergency Department (HOSPITAL_BASED_OUTPATIENT_CLINIC_OR_DEPARTMENT_OTHER): Payer: Medicare Other

## 2020-05-10 ENCOUNTER — Inpatient Hospital Stay (HOSPITAL_COMMUNITY)
Admission: EM | Admit: 2020-05-10 | Discharge: 2020-05-13 | DRG: 602 | Disposition: A | Discharge status: Home / Self Care | Payer: Medicare Other | Source: Ambulatory Visit | Attending: Internal Medicine | Admitting: Internal Medicine

## 2020-05-10 ENCOUNTER — Encounter (HOSPITAL_COMMUNITY): Payer: Self-pay

## 2020-05-10 ENCOUNTER — Emergency Department (HOSPITAL_COMMUNITY): Payer: Medicare Other

## 2020-05-10 DIAGNOSIS — M7989 Other specified soft tissue disorders: Secondary | ICD-10-CM

## 2020-05-10 DIAGNOSIS — Z6841 Body Mass Index (BMI) 40.0 and over, adult: Secondary | ICD-10-CM

## 2020-05-10 DIAGNOSIS — Z992 Dependence on renal dialysis: Secondary | ICD-10-CM

## 2020-05-10 DIAGNOSIS — Z86711 Personal history of pulmonary embolism: Secondary | ICD-10-CM

## 2020-05-10 DIAGNOSIS — Z8619 Personal history of other infectious and parasitic diseases: Secondary | ICD-10-CM

## 2020-05-10 DIAGNOSIS — J9601 Acute respiratory failure with hypoxia: Secondary | ICD-10-CM | POA: Diagnosis present

## 2020-05-10 DIAGNOSIS — E8770 Fluid overload, unspecified: Secondary | ICD-10-CM | POA: Diagnosis present

## 2020-05-10 DIAGNOSIS — J811 Chronic pulmonary edema: Secondary | ICD-10-CM | POA: Diagnosis not present

## 2020-05-10 DIAGNOSIS — M79609 Pain in unspecified limb: Secondary | ICD-10-CM | POA: Diagnosis not present

## 2020-05-10 DIAGNOSIS — L03115 Cellulitis of right lower limb: Secondary | ICD-10-CM | POA: Diagnosis not present

## 2020-05-10 DIAGNOSIS — I120 Hypertensive chronic kidney disease with stage 5 chronic kidney disease or end stage renal disease: Secondary | ICD-10-CM | POA: Diagnosis present

## 2020-05-10 DIAGNOSIS — R410 Disorientation, unspecified: Secondary | ICD-10-CM | POA: Diagnosis not present

## 2020-05-10 DIAGNOSIS — M1712 Unilateral primary osteoarthritis, left knee: Secondary | ICD-10-CM | POA: Diagnosis present

## 2020-05-10 DIAGNOSIS — L0390 Cellulitis, unspecified: Secondary | ICD-10-CM | POA: Diagnosis present

## 2020-05-10 DIAGNOSIS — Z83438 Family history of other disorder of lipoprotein metabolism and other lipidemia: Secondary | ICD-10-CM

## 2020-05-10 DIAGNOSIS — D631 Anemia in chronic kidney disease: Secondary | ICD-10-CM | POA: Diagnosis present

## 2020-05-10 DIAGNOSIS — R52 Pain, unspecified: Secondary | ICD-10-CM | POA: Diagnosis not present

## 2020-05-10 DIAGNOSIS — Z8249 Family history of ischemic heart disease and other diseases of the circulatory system: Secondary | ICD-10-CM

## 2020-05-10 DIAGNOSIS — L03116 Cellulitis of left lower limb: Secondary | ICD-10-CM | POA: Diagnosis not present

## 2020-05-10 DIAGNOSIS — R21 Rash and other nonspecific skin eruption: Secondary | ICD-10-CM | POA: Diagnosis not present

## 2020-05-10 DIAGNOSIS — M1A9XX0 Chronic gout, unspecified, without tophus (tophi): Secondary | ICD-10-CM | POA: Diagnosis present

## 2020-05-10 DIAGNOSIS — N186 End stage renal disease: Secondary | ICD-10-CM | POA: Diagnosis not present

## 2020-05-10 DIAGNOSIS — Z833 Family history of diabetes mellitus: Secondary | ICD-10-CM

## 2020-05-10 DIAGNOSIS — Z20822 Contact with and (suspected) exposure to covid-19: Secondary | ICD-10-CM | POA: Diagnosis present

## 2020-05-10 DIAGNOSIS — R600 Localized edema: Secondary | ICD-10-CM

## 2020-05-10 DIAGNOSIS — R4182 Altered mental status, unspecified: Secondary | ICD-10-CM | POA: Diagnosis not present

## 2020-05-10 DIAGNOSIS — I10 Essential (primary) hypertension: Secondary | ICD-10-CM | POA: Diagnosis not present

## 2020-05-10 DIAGNOSIS — Z888 Allergy status to other drugs, medicaments and biological substances status: Secondary | ICD-10-CM

## 2020-05-10 DIAGNOSIS — R000 Tachycardia, unspecified: Secondary | ICD-10-CM | POA: Diagnosis not present

## 2020-05-10 DIAGNOSIS — R509 Fever, unspecified: Secondary | ICD-10-CM | POA: Diagnosis not present

## 2020-05-10 DIAGNOSIS — E872 Acidosis: Secondary | ICD-10-CM | POA: Diagnosis present

## 2020-05-10 DIAGNOSIS — Z881 Allergy status to other antibiotic agents status: Secondary | ICD-10-CM

## 2020-05-10 DIAGNOSIS — R0602 Shortness of breath: Secondary | ICD-10-CM | POA: Diagnosis not present

## 2020-05-10 DIAGNOSIS — R748 Abnormal levels of other serum enzymes: Secondary | ICD-10-CM | POA: Diagnosis present

## 2020-05-10 LAB — HEPATIC FUNCTION PANEL
ALT: 33 U/L (ref 0–44)
AST: 25 U/L (ref 15–41)
Albumin: 3.1 g/dL — ABNORMAL LOW (ref 3.5–5.0)
Alkaline Phosphatase: 168 U/L — ABNORMAL HIGH (ref 38–126)
Bilirubin, Direct: <0.1 mg/dL (ref 0.0–0.2)
Total Bilirubin: 0.8 mg/dL (ref 0.3–1.2)
Total Protein: 7.5 g/dL (ref 6.5–8.1)

## 2020-05-10 LAB — CBC WITH DIFFERENTIAL/PLATELET
Abs Immature Granulocytes: 0.11 10*3/uL — ABNORMAL HIGH (ref 0.00–0.07)
Basophils Absolute: 0.1 10*3/uL (ref 0.0–0.1)
Basophils Relative: 0 %
Eosinophils Absolute: 0.1 10*3/uL (ref 0.0–0.5)
Eosinophils Relative: 1 %
HCT: 29.1 % — ABNORMAL LOW (ref 36.0–46.0)
Hemoglobin: 9 g/dL — ABNORMAL LOW (ref 12.0–15.0)
Immature Granulocytes: 1 %
Lymphocytes Relative: 10 %
Lymphs Abs: 1.6 10*3/uL (ref 0.7–4.0)
MCH: 27.7 pg (ref 26.0–34.0)
MCHC: 30.9 g/dL (ref 30.0–36.0)
MCV: 89.5 fL (ref 80.0–100.0)
Monocytes Absolute: 1.5 10*3/uL — ABNORMAL HIGH (ref 0.1–1.0)
Monocytes Relative: 9 %
Neutro Abs: 12.5 10*3/uL — ABNORMAL HIGH (ref 1.7–7.7)
Neutrophils Relative %: 79 %
Platelets: 261 10*3/uL (ref 150–400)
RBC: 3.25 MIL/uL — ABNORMAL LOW (ref 3.87–5.11)
RDW: 18.4 % — ABNORMAL HIGH (ref 11.5–15.5)
WBC: 15.9 10*3/uL — ABNORMAL HIGH (ref 4.0–10.5)
nRBC: 0 % (ref 0.0–0.2)

## 2020-05-10 LAB — BASIC METABOLIC PANEL
Anion gap: 22 — ABNORMAL HIGH (ref 5–15)
BUN: 79 mg/dL — ABNORMAL HIGH (ref 6–20)
CO2: 17 mmol/L — ABNORMAL LOW (ref 22–32)
Calcium: 8.4 mg/dL — ABNORMAL LOW (ref 8.9–10.3)
Chloride: 97 mmol/L — ABNORMAL LOW (ref 98–111)
Creatinine, Ser: 10.66 mg/dL — ABNORMAL HIGH (ref 0.44–1.00)
GFR, Estimated: 4 mL/min — ABNORMAL LOW (ref >60)
Glucose, Bld: 88 mg/dL (ref 70–99)
Potassium: 4.9 mmol/L (ref 3.5–5.1)
Sodium: 136 mmol/L (ref 135–145)

## 2020-05-10 LAB — LACTIC ACID, PLASMA: Lactic Acid, Venous: 1.2 mmol/L (ref 0.5–1.9)

## 2020-05-10 LAB — TROPONIN I (HIGH SENSITIVITY)
Troponin I (High Sensitivity): 35 ng/L — ABNORMAL HIGH (ref <18)
Troponin I (High Sensitivity): 36 ng/L — ABNORMAL HIGH (ref <18)

## 2020-05-10 LAB — PROTIME-INR
INR: 1.2 (ref 0.8–1.2)
Prothrombin Time: 14.4 seconds (ref 11.4–15.2)

## 2020-05-10 LAB — APTT: aPTT: 35 seconds (ref 24–36)

## 2020-05-10 LAB — RESPIRATORY PANEL BY RT PCR (FLU A&B, COVID)
Influenza A by PCR: NEGATIVE
Influenza B by PCR: NEGATIVE
SARS Coronavirus 2 by RT PCR: NEGATIVE

## 2020-05-10 LAB — BRAIN NATRIURETIC PEPTIDE: B Natriuretic Peptide: 81.3 pg/mL (ref 0.0–100.0)

## 2020-05-10 MED ORDER — PAROXETINE HCL 10 MG PO TABS
10.0000 mg | ORAL_TABLET | Freq: Every day | ORAL | Status: DC
  Administered 2020-05-10 – 2020-05-13 (×4): 10 mg via ORAL
  Filled 2020-05-10 (×4): qty 1

## 2020-05-10 MED ORDER — SENNA 8.6 MG PO TABS
1.0000 | ORAL_TABLET | Freq: Two times a day (BID) | ORAL | Status: DC
  Administered 2020-05-13: 8.6 mg via ORAL
  Filled 2020-05-10 (×4): qty 1

## 2020-05-10 MED ORDER — GABAPENTIN 300 MG PO CAPS
300.0000 mg | ORAL_CAPSULE | Freq: Three times a day (TID) | ORAL | Status: DC
  Administered 2020-05-10 – 2020-05-13 (×8): 300 mg via ORAL
  Filled 2020-05-10 (×9): qty 1

## 2020-05-10 MED ORDER — DOXYCYCLINE HYCLATE 100 MG PO TABS
100.0000 mg | ORAL_TABLET | Freq: Two times a day (BID) | ORAL | Status: DC
  Administered 2020-05-10 – 2020-05-13 (×6): 100 mg via ORAL
  Filled 2020-05-10 (×6): qty 1

## 2020-05-10 MED ORDER — PRAMIPEXOLE DIHYDROCHLORIDE 0.125 MG PO TABS
0.2500 mg | ORAL_TABLET | Freq: Every day | ORAL | Status: DC
  Administered 2020-05-10 – 2020-05-13 (×4): 0.25 mg via ORAL
  Filled 2020-05-10: qty 2
  Filled 2020-05-10: qty 1
  Filled 2020-05-10 (×3): qty 2

## 2020-05-10 MED ORDER — FENTANYL CITRATE (PF) 100 MCG/2ML IJ SOLN
50.0000 ug | Freq: Once | INTRAMUSCULAR | Status: AC
  Administered 2020-05-10: 50 ug via INTRAVENOUS
  Filled 2020-05-10: qty 2

## 2020-05-10 MED ORDER — ACETAMINOPHEN 325 MG PO TABS
650.0000 mg | ORAL_TABLET | Freq: Four times a day (QID) | ORAL | Status: DC | PRN
  Administered 2020-05-10 – 2020-05-13 (×3): 650 mg via ORAL
  Filled 2020-05-10 (×4): qty 2

## 2020-05-10 MED ORDER — VANCOMYCIN HCL IN DEXTROSE 1-5 GM/200ML-% IV SOLN: 1000.0000 mg | Freq: Once | INTRAVENOUS | Status: DC

## 2020-05-10 MED ORDER — HYDROMORPHONE HCL 1 MG/ML IJ SOLN
1.0000 mg | Freq: Once | INTRAMUSCULAR | Status: AC
  Administered 2020-05-10: 1 mg via INTRAVENOUS
  Filled 2020-05-10: qty 1

## 2020-05-10 MED ORDER — VANCOMYCIN HCL 10 G IV SOLR
2500.0000 mg | Freq: Once | INTRAVENOUS | Status: DC
  Filled 2020-05-10: qty 2500

## 2020-05-10 MED ORDER — SODIUM CHLORIDE 0.9 % IV SOLN
2.0000 g | Freq: Once | INTRAVENOUS | Status: AC
  Administered 2020-05-10: 2 g via INTRAVENOUS
  Filled 2020-05-10: qty 2

## 2020-05-10 MED ORDER — LINEZOLID 600 MG/300ML IV SOLN
600.0000 mg | Freq: Two times a day (BID) | INTRAVENOUS | Status: DC
  Administered 2020-05-10: 600 mg via INTRAVENOUS
  Filled 2020-05-10 (×2): qty 300

## 2020-05-10 MED ORDER — AMLODIPINE BESYLATE 5 MG PO TABS
5.0000 mg | ORAL_TABLET | Freq: Every day | ORAL | Status: DC
  Administered 2020-05-10 – 2020-05-11 (×2): 5 mg via ORAL
  Filled 2020-05-10 (×2): qty 1

## 2020-05-10 MED ORDER — MELATONIN 5 MG PO TABS
10.0000 mg | ORAL_TABLET | Freq: Every evening | ORAL | Status: DC | PRN
  Administered 2020-05-10: 10 mg via ORAL
  Filled 2020-05-10 (×2): qty 2

## 2020-05-10 MED ORDER — HEPARIN SODIUM (PORCINE) 5000 UNIT/ML IJ SOLN
5000.0000 [IU] | Freq: Three times a day (TID) | INTRAMUSCULAR | Status: DC
  Administered 2020-05-10 – 2020-05-13 (×8): 5000 [IU] via SUBCUTANEOUS
  Filled 2020-05-10 (×8): qty 1

## 2020-05-10 MED ORDER — ONDANSETRON HCL 4 MG/2ML IJ SOLN: 4.0000 mg | Freq: Four times a day (QID) | INTRAMUSCULAR | Status: DC | PRN

## 2020-05-10 MED ORDER — OXYCODONE HCL 5 MG PO TABS
5.0000 mg | ORAL_TABLET | Freq: Four times a day (QID) | ORAL | Status: DC | PRN
  Administered 2020-05-11 (×2): 5 mg via ORAL
  Filled 2020-05-10 (×2): qty 1

## 2020-05-10 MED ORDER — PANTOPRAZOLE SODIUM 20 MG PO TBEC
20.0000 mg | DELAYED_RELEASE_TABLET | Freq: Every day | ORAL | Status: DC
  Administered 2020-05-10 – 2020-05-13 (×4): 20 mg via ORAL
  Filled 2020-05-10 (×4): qty 1

## 2020-05-10 MED ORDER — TORSEMIDE 100 MG PO TABS
100.0000 mg | ORAL_TABLET | Freq: Every day | ORAL | Status: DC
  Administered 2020-05-10 – 2020-05-13 (×4): 100 mg via ORAL
  Filled 2020-05-10 (×2): qty 1
  Filled 2020-05-10: qty 5
  Filled 2020-05-10: qty 1
  Filled 2020-05-10: qty 5

## 2020-05-10 MED ORDER — METRONIDAZOLE IN NACL 5-0.79 MG/ML-% IV SOLN
500.0000 mg | Freq: Once | INTRAVENOUS | Status: AC
  Administered 2020-05-10: 500 mg via INTRAVENOUS
  Filled 2020-05-10: qty 100

## 2020-05-10 MED ORDER — ONDANSETRON HCL 4 MG PO TABS: 4.0000 mg | ORAL_TABLET | Freq: Four times a day (QID) | ORAL | Status: DC | PRN

## 2020-05-10 MED ORDER — CALCIUM ACETATE (PHOS BINDER) 667 MG PO CAPS
1334.0000 mg | ORAL_CAPSULE | Freq: Three times a day (TID) | ORAL | Status: DC
  Administered 2020-05-11 – 2020-05-13 (×7): 1334 mg via ORAL
  Filled 2020-05-10 (×8): qty 2

## 2020-05-10 MED ORDER — ACETAMINOPHEN 650 MG RE SUPP: 650.0000 mg | Freq: Four times a day (QID) | RECTAL | Status: DC | PRN

## 2020-05-10 MED ORDER — DOCUSATE SODIUM 100 MG PO CAPS
100.0000 mg | ORAL_CAPSULE | Freq: Two times a day (BID) | ORAL | Status: DC
  Administered 2020-05-13: 100 mg via ORAL
  Filled 2020-05-10 (×5): qty 1

## 2020-05-10 MED ORDER — ACETAMINOPHEN 325 MG PO TABS
650.0000 mg | ORAL_TABLET | Freq: Once | ORAL | Status: AC
  Administered 2020-05-10: 650 mg via ORAL
  Filled 2020-05-10: qty 2

## 2020-05-10 NOTE — ED Provider Notes (Signed)
[facility] Provider Note   CSN: [id number] Arrival date & time: 05/10/20  1117     History Chief Complaint  Patient presents with  . Rash    Christine Horn is a 54 y.o. female.  HPI   Patient is a 54 year old female with a history of cellulitis of the leg, stage V CKD on hemodialysis, lower leg edema, PE not currently anticoagulated, who presents to the emergency department due to leg swelling.  Patient states that she is a Tuesday, Thursday, and Saturday hemodialysis patient.  She had a normal dialysis session on Tuesday.  She states yesterday evening she began experiencing some mild chest tightness as well as acute on chronic shortness of breath.  She then began experiencing bilateral lower extremity edema with scattered purpuric lesions on the lower extremities.  She reports exquisite pain throughout the lower extremities and feet.  Patient went to her PCPs office and had O2 sats around 88% on room air and was placed on 2 L via nasal cannula and transported to the emergency department.  Also reports some n/v initially and just nausea currently.  Patient reports being outdoors in the past few weeks and states that later she noticed a scab on the top of her scalp but has not visualized any ticks on the surface of her skin.  No HA, neck pain, neck stiffness.      Past Medical History:  Diagnosis Date  . Anemia    On Epogen from Nephrologist  . Cellulitis of leg   . Chronic gout   . Chronic kidney disease    Stage 5  . Lower leg edema    Left  . Pulmonary emboli (HCC)     There are no problems to display for this patient.   History reviewed. No pertinent surgical history.   OB History   No obstetric history on file.     Family History  Problem Relation Age of Onset  . Hypertension Mother   . Hyperlipidemia Mother   . Diabetes Mother   . Hypertension Father   . Hyperlipidemia Father   . Diabetes Father   . Hypertension Sister    . Diabetes Sister   . Mental illness Sister     Social History   Tobacco Use  . Smoking status: Never Smoker  . Smokeless tobacco: Never Used  Substance Use Topics  . Alcohol use: Yes    Comment: Occasional  . Drug use: Not on file    Home Medications Prior to Admission medications   Medication Sig Start Date End Date Taking? Authorizing Provider  allopurinol (ZYLOPRIM) 100 MG tablet Take 100 mg by mouth daily.    [provider]  atenolol (TENORMIN) 25 MG tablet Take by mouth daily.    [provider]  CALCITRIOL PO Take 0.75 mg by mouth.    [provider]  Ferrous Sulfate (IRON PO) Take by mouth. Q month via IV    [provider]  HYDROcodone-acetaminophen (NORCO/VICODIN) 5-325 MG tablet Take 1 tablet by mouth every 6 (six) hours as needed for moderate pain.    [provider]  omeprazole (PRILOSEC) 20 MG capsule Take 20 mg by mouth daily.    [provider]  ondansetron (ZOFRAN-ODT) 4 MG disintegrating tablet Take 4 mg by mouth every 6 (six) hours as needed for nausea or vomiting.    [provider]  rOPINIRole (REQUIP) 0.25 MG tablet Take 0.25 mg by mouth at bedtime.  [provider]  torsemide (DEMADEX) 100 MG tablet Take 100 mg by mouth daily.    [provider]    Allergies    Cozaar [losartan potassium]  Review of Systems   Review of Systems  All other systems reviewed and are negative. Ten systems reviewed and are negative for acute change, except as noted in the HPI.    Physical Exam Updated Vital Signs BP 122/73   Pulse (!) 115   Temp 100 F (37.8 C) (Oral)   Resp (!) 22   Ht 5\' 4"  (1.626 m)   Wt 131.5 kg   SpO2 96%   BMI 49.78 kg/m   Physical Exam Vitals and nursing note reviewed.  Constitutional:      General: She is in acute distress.     Appearance: Normal appearance. She is obese. She is not ill-appearing, toxic-appearing or diaphoretic.  HENT:     Head:  Normocephalic and atraumatic.     Comments: Small round healing scab noted to the superior scalp.    Right Ear: External ear normal.     Left Ear: External ear normal.     Nose: Nose normal.     Mouth/Throat:     Mouth: Mucous membranes are moist.     Pharynx: Oropharynx is clear. No oropharyngeal exudate or posterior oropharyngeal erythema.  Eyes:     Extraocular Movements: Extraocular movements intact.  Cardiovascular:     Rate and Rhythm: Regular rhythm. Tachycardia present.     Pulses: Normal pulses.     Heart sounds: Murmur heard.  No friction rub. No gallop.   Pulmonary:     Effort: Pulmonary effort is normal. No respiratory distress.     Breath sounds: Normal breath sounds. No stridor. No wheezing, rhonchi or rales.     Comments: Oxygen saturations in the mid 90s on room air. Abdominal:     General: Abdomen is flat.     Tenderness: There is no abdominal tenderness.  Musculoskeletal:        General: Normal range of motion.     Cervical back: Normal range of motion and neck supple. No tenderness.     Right lower leg: Edema present.     Left lower leg: Edema present.  Skin:    General: Skin is warm and dry.     Comments: Please see images below of lower extremities.  Exquisite diffuse TTP noted in the lower extremities as well as the feet.  Purpuric lesions noted to the lower extremities that are nonblanching.  No lesions on the palms of the hands or the soles of the feet.  Neurological:     General: No focal deficit present.     Mental Status: She is alert and oriented to person, place, and time.  Psychiatric:        Mood and Affect: Mood normal.        Behavior: Behavior normal.       ED Results / Procedures / Treatments   Labs (all labs ordered are listed, but only abnormal results are displayed) Labs Reviewed  BASIC METABOLIC PANEL - Abnormal; Notable for the following components:      Result Value   Chloride 97 (*)    CO2 17 (*)    BUN 79 (*)    Creatinine,  Ser 10.66 (*)    Calcium 8.4 (*)    GFR, Estimated 4 (*)    Anion gap 22 (*)    All other components within normal limits  HEPATIC FUNCTION PANEL - Abnormal; Notable for the following components:   Albumin 3.1 (*)    Alkaline Phosphatase 168 (*)    All other components within normal limits  CBC WITH DIFFERENTIAL/PLATELET - Abnormal; Notable for the following components:   WBC 15.9 (*)    RBC 3.25 (*)    Hemoglobin 9.0 (*)    HCT 29.1 (*)    RDW 18.4 (*)    Neutro Abs 12.5 (*)    Monocytes Absolute 1.5 (*)    Abs Immature Granulocytes 0.11 (*)    All other components within normal limits  TROPONIN I (HIGH SENSITIVITY) - Abnormal; Notable for the following components:   Troponin I (High Sensitivity) 35 (*)    All other components within normal limits  TROPONIN I (HIGH SENSITIVITY) - Abnormal; Notable for the following components:   Troponin I (High Sensitivity) 36 (*)    All other components within normal limits  RESPIRATORY PANEL BY RT PCR (FLU A&B, COVID)  CULTURE, BLOOD (ROUTINE X 2)  CULTURE, BLOOD (ROUTINE X 2)  URINE CULTURE  LACTIC ACID, PLASMA  PROTIME-INR  APTT  BRAIN NATRIURETIC PEPTIDE  URINALYSIS, ROUTINE W REFLEX MICROSCOPIC  ROCKY MTN SPOTTED FVR ABS PNL(IGG+IGM)   EKG EKG Interpretation  Date/Time:  Thursday May 10 2020 11:18:56 EST Ventricular Rate:  121 PR Interval:  158 QRS Duration: 72 QT Interval:  312 QTC Calculation: 443 R Axis:     Text Interpretation: Sinus tachycardia with Premature atrial complexes with Abberant conduction Otherwise normal ECG No prior EG for comparison. No STEMI Confirmed by Antony Blackbird [phone]) on 05/10/2020 1:52:49 PM  Radiology DG Chest 2 View  Result Date: 05/10/2020 CLINICAL DATA:  Chest tightness, leg swelling EXAM: CHEST - 2 VIEW COMPARISON:  None. FINDINGS: Normal mediastinum and cardiac silhouette. Mild interstitial prominence. No evidence of effusion, infiltrate, or pneumothorax. No acute bony abnormality.  IMPRESSION: Mild interstitial edema pattern. Electronically Signed   By: Suzy Bouchard M.D.   On: 05/10/2020 11:58   VAS [place] LOWER EXTREMITY VENOUS (DVT) (ONLY [facility] & [facility])  Result Date: 05/10/2020  Lower Venous DVT Study Indications: Swelling, Pain, and a sudden onset of red spots on both leg.  Limitations: Body habitus. Comparison Study: No prior. Performing Technologist: Oda Cogan RDMS, RVT  Examination Guidelines: A complete evaluation includes B-mode imaging, spectral Doppler, color Doppler, and power Doppler as needed of all accessible portions of each vessel. Bilateral testing is considered an integral part of a complete examination. Limited examinations for reoccurring indications may be performed as noted. The reflux portion of the exam is performed with the patient in reverse Trendelenburg.  +---------+---------------+---------+-----------+----------+--------------+ RIGHT    CompressibilityPhasicitySpontaneityPropertiesThrombus Aging +---------+---------------+---------+-----------+----------+--------------+ CFV      Full           Yes      Yes                                 +---------+---------------+---------+-----------+----------+--------------+ SFJ      Full                                                        +---------+---------------+---------+-----------+----------+--------------+ FV Prox  Full                                                        +---------+---------------+---------+-----------+----------+--------------+  FV Mid   Full                                                        +---------+---------------+---------+-----------+----------+--------------+ FV DistalFull                                                        +---------+---------------+---------+-----------+----------+--------------+ PFV      Full                                                         +---------+---------------+---------+-----------+----------+--------------+ POP      Full           Yes      Yes                                 +---------+---------------+---------+-----------+----------+--------------+ PTV      Full                                                        +---------+---------------+---------+-----------+----------+--------------+ PERO     Full                                                        +---------+---------------+---------+-----------+----------+--------------+   +---------+---------------+---------+-----------+----------+--------------+ LEFT     CompressibilityPhasicitySpontaneityPropertiesThrombus Aging +---------+---------------+---------+-----------+----------+--------------+ CFV      Full           Yes      Yes                                 +---------+---------------+---------+-----------+----------+--------------+ SFJ      Full                                                        +---------+---------------+---------+-----------+----------+--------------+ FV Prox  Full                                                        +---------+---------------+---------+-----------+----------+--------------+ FV Mid   Full                                                        +---------+---------------+---------+-----------+----------+--------------+  FV DistalFull                                                        +---------+---------------+---------+-----------+----------+--------------+ PFV      Full                                                        +---------+---------------+---------+-----------+----------+--------------+ POP      Full           Yes      Yes                                 +---------+---------------+---------+-----------+----------+--------------+ PTV      Full                                                         +---------+---------------+---------+-----------+----------+--------------+ PERO     Full                                                        +---------+---------------+---------+-----------+----------+--------------+     Summary: BILATERAL: - No evidence of deep vein thrombosis seen in the lower extremities, bilaterally. -No evidence of popliteal cyst, bilaterally.   *See table(s) above for measurements and observations. Electronically signed by Monica Martinez MD on 05/10/2020 at 4:24:23 PM.    Final    Procedures Procedures (including critical care time)  Medications Ordered in ED Medications  fentaNYL (SUBLIMAZE) injection 50 mcg (has no administration in time range)  linezolid (ZYVOX) IVPB 600 mg (has no administration in time range)  acetaminophen (TYLENOL) tablet 650 mg (650 mg Oral Given 05/10/20 1310)  fentaNYL (SUBLIMAZE) injection 50 mcg (50 mcg Intravenous Given 05/10/20 1454)  ceFEPIme (MAXIPIME) 2 g in sodium chloride 0.9 % 100 mL IVPB ( Intravenous Stopped 05/10/20 1550)  metroNIDAZOLE (FLAGYL) IVPB 500 mg ( Intravenous Stopped 05/10/20 1626)   ED Course  I have reviewed the triage vital signs and the nursing notes.  Pertinent labs & imaging results that were available during my care of the patient were reviewed by me and considered in my medical decision making (see chart for details).  Clinical Course as of May 10 1822  Thu May 10, 2020  1421 Mild interstitial edema.  DG Chest 2 View [LJ]  1421 Temp(!): 101.1 F (38.4 C) [LJ]  1421 Given APAP.  Temp: 100 F (37.8 C) [LJ]  1529 WBC(!): 15.9 [LJ]  1529 NEUT#(!): 12.5 [LJ]  1530 SARS Coronavirus 2 by RT PCR: NEGATIVE [LJ]  1601 Troponin I (High Sensitivity)(!): 35 [LJ]  1657 Patient discussed with [facility] and her pharmacy department given her listed allergy to vancomycin.  They recommended that we move forward with linezolid, 600 mg IV twice daily.  They are not concerned given patient has  also been given  fentanyl.  We discussed possible drug drug interactions.   [LJ]  8786 Patient discussed with Dr. Justin Mend with nephrology.  He is aware that patient will likely be admitted and that she will need dialysis.   [LJ]  1810 B Natriuretic Peptide: 81.3 [LJ]  1810 Lactic Acid, Venous: 1.2 [LJ]    Clinical Course User Index [LJ] Rayna Sexton, PA-C   MDM Rules/Calculators/A&P                          Patient is a 54 year old female who presents to the emergency department with leg swelling, chest tightness, shortness of breath, purpuric lesions to the lower extremities.  Patient initially presented mildly hypoxic which improved with 2 L of oxygen via nasal cannula.  She has no home oxygen requirement.  This was removed and she has had no further episodes of hypoxia in the emergency department.  She was mildly febrile at 101.1 Fahrenheit as well as tachycardic.  Patient was discussed with and evaluated by my attending physician Dr. Marda Stalker.  Patient was given IV antibiotics per sepsis protocol.  I have obtained a vascular ultrasound of the lower extremities which is negative for DVT.  Chest x-ray shows some mild interstitial edema.  Due to her interstitial edema as well as leg swelling we refrained from IV fluid bolus at this time.  She was due for dialysis today but due to her symptoms has not received dialysis.  She states she had a normal dialysis session on Tuesday.  She was discussed with Dr. Justin Mend with nephrology who is aware of the patient's admission.  CBC with leukocytosis of 15.9 and neutrophilia of 12.5.  No elevation in lactic acid.  Very mild elevation in troponin at 35.  BNP at 81.3.  Consideration for RMSF versus Henoch-Schnlein purpura versus endocarditis versus idiopathic.  RMSF panel is pending.  Blood cultures obtained.  Patient discussed with the internal medicine team for admission and they will accept the patient into their care at this time.  Respiratory panel has been  ordered and is negative.  Final Clinical Impression(s) / ED Diagnoses Final diagnoses:  Leg swelling  Rash  Shortness of breath   Rx / DC Orders ED Discharge Orders    None       Rayna Sexton, PA-C 05/10/20 1823    Tegeler, Gwenyth Allegra, MD 05/14/20 [phone]

## 2020-05-10 NOTE — Progress Notes (Signed)
   05/10/20 2157  Assess: MEWS Score  Temp (!) 100.6 F (38.1 C)  BP 124/82  Pulse Rate (!) 120  Resp 19  Level of Consciousness Alert  SpO2 98 %  O2 Device Room Air  Assess: MEWS Score  MEWS Temp 1  MEWS Systolic 0  MEWS Pulse 2  MEWS RR 0  MEWS LOC 0  MEWS Score 3  MEWS Score Color Yellow  Assess: if the MEWS score is Yellow or Red  Were vital signs taken at a resting state? Yes  Focused Assessment No change from prior assessment  Early Detection of Sepsis Score *See Row Information* Medium  MEWS guidelines implemented *See Row Information* Yes  Treat  MEWS Interventions Administered prn meds/treatments  Pain Scale 0-10  Pain Score 2  Pain Type Acute pain  Pain Location Leg  Pain Orientation Right;Left  Pain Descriptors / Indicators Aching;Tightness  Pain Frequency Constant  Pain Onset Gradual  Patients Stated Pain Goal 2  Pain Intervention(s) Distraction;Elevated extremity;Emotional support  Multiple Pain Sites No  Take Vital Signs  Increase Vital Sign Frequency  Yellow: Q 2hr X 2 then Q 4hr X 2, if remains yellow, continue Q 4hrs  Escalate  MEWS: Escalate Yellow: discuss with charge nurse/RN and consider discussing with provider and RRT  Notify: Charge Nurse/RN  Name of Charge Nurse/RN Notified Josephine  Date Charge Nurse/RN Notified 05/10/20  Time Charge Nurse/RN Notified 2223  Notify: Provider  Provider Name/Title Dr Coy Saunas  Date Provider Notified 05/10/20  Time Provider Notified 2221  Notification Type Page  Notification Reason Change in status  Response No new orders  Date of Provider Response 05/09/20  Time of Provider Response 2227  Document  Progress note created (see row info) Yes

## 2020-05-10 NOTE — Progress Notes (Signed)
Venous duplex lower ext.  has been completed. Refer to [facility] under chart review to view preliminary results.   05/10/2020  4:09 PM Katalena Malveaux, Bonnye Fava

## 2020-05-10 NOTE — ED Triage Notes (Signed)
Pt bib [facility] w/ c/o BLE rash that developed yesterday. Pt also endorses chest tightness that started at the same time. Pt is Tues, Thurs, Sat dialysis pt, received dialysis tues, but did not go today d/t rash. Pt was placed on 2 lpm o2 via [place] at MD office today, d/t SPO2 88% RA. Pt also tachy in triage w/ HR 120. Pt reports 10/10 pain w/ rash.

## 2020-05-10 NOTE — ED Notes (Signed)
US tech at bedside

## 2020-05-10 NOTE — H&P (Signed)
NAME:  Christine Horn, MRN:  [id number], DOB:  10-Sep-1965, LOS: 0 ADMISSION DATE:  05/10/2020, Primary: Patient, No Pcp Per  CHIEF COMPLAINT: Rash of bilateral lower extremities  Medical Service: Internal Medicine Teaching Service         Attending Physician: Dr. Aldine Contes, MD    First Contact: Dr. Alfonse Spruce Pager: [phone]  Second Contact: Dr. Darrick Meigs Pager: [phone]       After Hours (After 5p/  First Contact Pager: [phone]  weekends / holidays): Second Contact Pager: [phone]    History of present illness    Christine Horn is a 54 year old female with past medical history of CKD 5 on HD (TTS), PE not currently on anticoagulation, gout, partial colectomy in April who presents to the ED for a rash of bilateral lower extremities.  Patient went to dialysis on Tuesday and was in the usual state of health until last night.  She started to develop some shortness of breath with bilateral lower extremity swelling.  She also noticed scattered purpuric lesions of the lower extremities.  The rash is painful and itchy.  She went to her PCP this morning and was found to be hypoxemia and was transported to the ED.  Patient did not receive dialysis today.    She states that she has been going out with her husband in the woods in the last few days.  She is unsure of tick bite but report a scab on the top of her head which is new.  Patient is seen at bedside.  She appears in acute distress with tachypneic.  O2 sat is in the high 90s on room air.  She denies chest pain, nausea, vomiting, abdominal pain, dysuria.  She states that she never had a rash like this before.  Her temperature measured at home was 100.1.  ED course: Sepsis code was called.  She was given cefepime and Flagyl.  No fluid he was given.  CBC remarkable for white count of 15.9 with elevated neutrophils, hemoglobin of 9.  CMP shows creatinine of 10.6, BUN 79, alk phos 168 and calcium 8.4.  First troponin 35.  BNP 81.5.  Lactic 1.2.   Chest x-ray show mild interstitial edema.  Vascular ultrasound negative for DVT.  Covid negative.   Past Medical History  She,  has a past medical history of Anemia, Cellulitis of leg, Chronic gout, Chronic kidney disease, Lower leg edema, and Pulmonary emboli ([place]).   Home Medications     Prior to Admission medications   Medication Sig Start Date End Date Taking? Authorizing Provider  acetaminophen (TYLENOL) 325 MG tablet Take 650 mg by mouth every 6 (six) hours as needed for mild pain or headache.   Yes [provider]  amLODipine (NORVASC) 5 MG tablet Take 5 mg by mouth daily. 02/20/20  Yes [provider]  Calcium Acetate, Phos Binder, (CALCIUM ACETATE PO) Take 1,334 mg by mouth 3 (three) times daily.    Yes [provider]  fluticasone (FLONASE) 50 MCG/ACT nasal spray Place 1 spray into both nostrils daily.   Yes [provider]  gabapentin (NEURONTIN) 300 MG capsule Take 300 mg by mouth 3 (three) times daily.   Yes [provider]  lidocaine-prilocaine (EMLA) cream Apply 1 application topically Every Tuesday,Thursday,and Saturday with dialysis.  04/18/20  Yes [provider]  Melatonin 10 MG TABS Take 10 mg by mouth at bedtime as needed (for sleep).    Yes [provider]  omeprazole (PRILOSEC) 20 MG capsule  Take 20 mg by mouth daily.   Yes [provider]  ondansetron (ZOFRAN-ODT) 4 MG disintegrating tablet Take 4 mg by mouth every 6 (six) hours as needed for nausea or vomiting.   Yes [provider]  PARoxetine (PAXIL) 10 MG tablet Take 10 mg by mouth daily. 04/18/20  Yes [provider]  pramipexole (MIRAPEX) 0.125 MG tablet Take 0.25 mg by mouth daily. 05/03/20  Yes [provider]  promethazine (PHENERGAN) 25 MG tablet Take 25 mg by mouth every 4 (four) hours as needed for nausea or vomiting.  03/19/20  Yes [provider]  torsemide (DEMADEX) 100 MG tablet Take 100 mg by mouth  daily.   Yes [provider]    Allergies    Allergies as of 05/10/2020 - Review Complete 05/10/2020  Allergen Reaction Noted  . Cozaar [losartan potassium] Anaphylaxis 12/06/2018  . Vancomycin  04/20/2019    Social History  Occasional alcohol No smoking No drug use  Family History   Her family history includes Diabetes in her father, mother, and sister; Hyperlipidemia in her father and mother; Hypertension in her father, mother, and sister; Mental illness in her sister.   Significant Hospital Events     ROS  Review of Systems  Constitutional: Positive for chills, fever and malaise/fatigue.  Respiratory: Positive for shortness of breath.   Cardiovascular: Positive for leg swelling. Negative for chest pain.  Gastrointestinal: Negative for abdominal pain, diarrhea, nausea and vomiting.  Genitourinary: Negative for dysuria.  Musculoskeletal: Positive for joint pain.  Skin: Positive for rash.  Neurological: Negative for headaches.    Objective   Blood pressure (!) 144/82, pulse (!) 122, temperature 99.2 F (37.3 C), temperature source Oral, resp. rate 14, height _0  (1.626 m), weight 131.5 kg, SpO2 95 %.    Filed Weights   05/10/20 1352  Weight: 131.5 kg    Examination: GENERAL: in acute distress, tachypneic HEENT: head atraumatic. No conjunctival injection. Nares patent.  CARDIAC: Tachycardic, regular rhythm PULMONARY: Lung sounds clear to auscultation. ABDOMEN: soft. Nontender to palpation.  Nondistended.  Surgical scars noted SKIN: See pictures.  No rash on arms, abdomen or back PSYCH: A/Ox3.        Consults:  Nephrology  Significant Diagnostic Tests:  EKG: reviewed tracing, no STEMI, sinus tach  CXR: reviewed imaging.   Labs    CBC Latest Ref Rng & Units 05/10/2020  WBC 4.0 - 10.5 K/uL 15.9(H)  Hemoglobin 12.0 - 15.0 g/dL 9.0(L)  Hematocrit 36 - 46 % 29.1(L)  Platelets 150 - 400 K/uL 261   BMP Latest Ref Rng & Units 05/10/2020    Glucose 70 - 99 mg/dL 88  BUN 6 - 20 mg/dL 79(H)  Creatinine 0.44 - 1.00 mg/dL 10.66(H)  Sodium 135 - 145 mmol/L 136  Potassium 3.5 - 5.1 mmol/L 4.9  Chloride 98 - 111 mmol/L 97(L)  CO2 22 - 32 mmol/L 17(L)  Calcium 8.9 - 10.3 mg/dL 8.4(L)     Summary  54yof with ESRD on HD presenting admitted to [facility] on 05/10/20 for ongoing evaluation and management of LLE cellulitis with purpuric lesions. Acute respiratory failure 2/2 volume overload also noted on admission.  Assessment & Plan:  Active Problems:   Cellulitis  Acute hypoxic respiratory failure secondary to volume overload and fever. ESRD on HD  Patient presents with worsening LE edema and SOB. CXR consistent with mild pulmonary edema. Unsure of her cardiac function.  BNP 81.  Patient currently satting well on room air.  ED provider already is consulted nephrology who will see her tomorrow.  DVT was ruled out, low suspicion for PE. -Restart home torsemide -Follow-up nephrology recommendations -No fluids was given -Monitor O2 sat  Anion gap metabolic acidosis.  Likely secondary to uremia. Lactate normal.  -Follow-up nephrology recommendations  LLE cellulitis. Bilateral scattered purpuric lesions. Unclear etiology. Fever Tmax in ED 101.1, white count 16, lactate 1.2 No VTE appreciated on LE duplex Differentials include [place] spotted fever, Henoch-Schnlein purpura.  Low suspicion for gout, osteomyelitis or cellulitis given by bilaterality.  Low suspicion for meningitis given lack of headache for neck rigidity. --follow blood cultures -Patient received cefepime and Flagyl in the ED --follow [place] spotted serology --cover [place] spotted fever with doxycycline 100 mg twice daily.  Consulted with pharmacy and recommend PO over IV.   Tropinemia.  Serial troponins 35>36.  EKG negative for STEMI.  Low suspicion for ACS  Normocytic anemia Likely secondary to anemia of chronic disease secondary to CKD.  Her  baseline hemoglobin around 9-10. -CBC in a.m.  Elevated alkaline phosphatase Normal AST and ALT.  Her alk phos was also elevated dated back to March 2021. -Continue to monitor   Best practice:  CODE STATUS: Full Diet: Renal diet Pain management: Tylenol, oxycodone as needed GI prophylaxis: Protonix DVT for prophylaxis: Heparin Social considerations/Family communication: Spoken with patient and husband Dispo: Admit patient to Observation with expected length of stay less than 2 midnights.   Gaylan Gerold, DO INTERNAL MEDICINE RESIDENT PGY-1 Pager # [phone] 05/10/2020 7:57 PM

## 2020-05-11 ENCOUNTER — Other Ambulatory Visit: Payer: Self-pay

## 2020-05-11 ENCOUNTER — Inpatient Hospital Stay (HOSPITAL_COMMUNITY): Payer: Medicare Other

## 2020-05-11 DIAGNOSIS — R0602 Shortness of breath: Secondary | ICD-10-CM | POA: Diagnosis not present

## 2020-05-11 DIAGNOSIS — M6258 Muscle wasting and atrophy, not elsewhere classified, other site: Secondary | ICD-10-CM | POA: Diagnosis not present

## 2020-05-11 DIAGNOSIS — Z20822 Contact with and (suspected) exposure to covid-19: Secondary | ICD-10-CM | POA: Diagnosis present

## 2020-05-11 DIAGNOSIS — L0390 Cellulitis, unspecified: Secondary | ICD-10-CM | POA: Diagnosis not present

## 2020-05-11 DIAGNOSIS — M79661 Pain in right lower leg: Secondary | ICD-10-CM | POA: Diagnosis not present

## 2020-05-11 DIAGNOSIS — N186 End stage renal disease: Secondary | ICD-10-CM

## 2020-05-11 DIAGNOSIS — E872 Acidosis: Secondary | ICD-10-CM | POA: Diagnosis present

## 2020-05-11 DIAGNOSIS — Z881 Allergy status to other antibiotic agents status: Secondary | ICD-10-CM | POA: Diagnosis not present

## 2020-05-11 DIAGNOSIS — M1A9XX0 Chronic gout, unspecified, without tophus (tophi): Secondary | ICD-10-CM | POA: Diagnosis present

## 2020-05-11 DIAGNOSIS — J9601 Acute respiratory failure with hypoxia: Secondary | ICD-10-CM | POA: Diagnosis present

## 2020-05-11 DIAGNOSIS — Z992 Dependence on renal dialysis: Secondary | ICD-10-CM | POA: Diagnosis not present

## 2020-05-11 DIAGNOSIS — D631 Anemia in chronic kidney disease: Secondary | ICD-10-CM | POA: Diagnosis present

## 2020-05-11 DIAGNOSIS — Z6841 Body Mass Index (BMI) 40.0 and over, adult: Secondary | ICD-10-CM | POA: Diagnosis not present

## 2020-05-11 DIAGNOSIS — M19072 Primary osteoarthritis, left ankle and foot: Secondary | ICD-10-CM | POA: Diagnosis not present

## 2020-05-11 DIAGNOSIS — Z888 Allergy status to other drugs, medicaments and biological substances status: Secondary | ICD-10-CM | POA: Diagnosis not present

## 2020-05-11 DIAGNOSIS — R600 Localized edema: Secondary | ICD-10-CM

## 2020-05-11 DIAGNOSIS — M7732 Calcaneal spur, left foot: Secondary | ICD-10-CM | POA: Diagnosis not present

## 2020-05-11 DIAGNOSIS — M1712 Unilateral primary osteoarthritis, left knee: Secondary | ICD-10-CM | POA: Diagnosis not present

## 2020-05-11 DIAGNOSIS — N250 Renal osteodystrophy: Secondary | ICD-10-CM | POA: Diagnosis not present

## 2020-05-11 DIAGNOSIS — I120 Hypertensive chronic kidney disease with stage 5 chronic kidney disease or end stage renal disease: Secondary | ICD-10-CM | POA: Diagnosis not present

## 2020-05-11 DIAGNOSIS — Z8249 Family history of ischemic heart disease and other diseases of the circulatory system: Secondary | ICD-10-CM | POA: Diagnosis not present

## 2020-05-11 DIAGNOSIS — Z83438 Family history of other disorder of lipoprotein metabolism and other lipidemia: Secondary | ICD-10-CM | POA: Diagnosis not present

## 2020-05-11 DIAGNOSIS — M7989 Other specified soft tissue disorders: Secondary | ICD-10-CM | POA: Diagnosis not present

## 2020-05-11 DIAGNOSIS — Z833 Family history of diabetes mellitus: Secondary | ICD-10-CM | POA: Diagnosis not present

## 2020-05-11 DIAGNOSIS — R748 Abnormal levels of other serum enzymes: Secondary | ICD-10-CM | POA: Diagnosis present

## 2020-05-11 DIAGNOSIS — Z8619 Personal history of other infectious and parasitic diseases: Secondary | ICD-10-CM | POA: Diagnosis not present

## 2020-05-11 DIAGNOSIS — L03116 Cellulitis of left lower limb: Secondary | ICD-10-CM | POA: Diagnosis present

## 2020-05-11 DIAGNOSIS — M79662 Pain in left lower leg: Secondary | ICD-10-CM | POA: Diagnosis not present

## 2020-05-11 DIAGNOSIS — M19071 Primary osteoarthritis, right ankle and foot: Secondary | ICD-10-CM | POA: Diagnosis not present

## 2020-05-11 DIAGNOSIS — E8770 Fluid overload, unspecified: Secondary | ICD-10-CM | POA: Diagnosis present

## 2020-05-11 DIAGNOSIS — M25462 Effusion, left knee: Secondary | ICD-10-CM | POA: Diagnosis not present

## 2020-05-11 DIAGNOSIS — Z86711 Personal history of pulmonary embolism: Secondary | ICD-10-CM | POA: Diagnosis not present

## 2020-05-11 LAB — CBC
HCT: 28.3 % — ABNORMAL LOW (ref 36.0–46.0)
Hemoglobin: 8.8 g/dL — ABNORMAL LOW (ref 12.0–15.0)
MCH: 27.9 pg (ref 26.0–34.0)
MCHC: 31.1 g/dL (ref 30.0–36.0)
MCV: 89.8 fL (ref 80.0–100.0)
Platelets: 244 10*3/uL (ref 150–400)
RBC: 3.15 MIL/uL — ABNORMAL LOW (ref 3.87–5.11)
RDW: 18.6 % — ABNORMAL HIGH (ref 11.5–15.5)
WBC: 11.8 10*3/uL — ABNORMAL HIGH (ref 4.0–10.5)
nRBC: 0 % (ref 0.0–0.2)

## 2020-05-11 LAB — URINE CULTURE

## 2020-05-11 LAB — BASIC METABOLIC PANEL
Anion gap: 19 — ABNORMAL HIGH (ref 5–15)
BUN: 87 mg/dL — ABNORMAL HIGH (ref 6–20)
CO2: 20 mmol/L — ABNORMAL LOW (ref 22–32)
Calcium: 7.9 mg/dL — ABNORMAL LOW (ref 8.9–10.3)
Chloride: 97 mmol/L — ABNORMAL LOW (ref 98–111)
Creatinine, Ser: 11.49 mg/dL — ABNORMAL HIGH (ref 0.44–1.00)
GFR, Estimated: 4 mL/min — ABNORMAL LOW (ref >60)
Glucose, Bld: 100 mg/dL — ABNORMAL HIGH (ref 70–99)
Potassium: 5.1 mmol/L (ref 3.5–5.1)
Sodium: 136 mmol/L (ref 135–145)

## 2020-05-11 LAB — IRON AND TIBC
Iron: 18 ug/dL — ABNORMAL LOW (ref 28–170)
Saturation Ratios: 5 % — ABNORMAL LOW (ref 10.4–31.8)
TIBC: 330 ug/dL (ref 250–450)
UIBC: 312 ug/dL

## 2020-05-11 LAB — HEPATITIS B CORE ANTIBODY, TOTAL: Hep B Core Total Ab: NONREACTIVE

## 2020-05-11 LAB — FERRITIN: Ferritin: 199 ng/mL (ref 11–307)

## 2020-05-11 LAB — PHOSPHORUS: Phosphorus: 10.7 mg/dL — ABNORMAL HIGH (ref 2.5–4.6)

## 2020-05-11 LAB — SAVE SMEAR(SSMR), FOR PROVIDER SLIDE REVIEW

## 2020-05-11 LAB — HEPATITIS B SURFACE ANTIGEN: Hepatitis B Surface Ag: NONREACTIVE

## 2020-05-11 LAB — GLUCOSE, CAPILLARY: Glucose-Capillary: 108 mg/dL — ABNORMAL HIGH (ref 70–99)

## 2020-05-11 MED ORDER — HYDROMORPHONE HCL 2 MG PO TABS
2.0000 mg | ORAL_TABLET | Freq: Four times a day (QID) | ORAL | Status: DC | PRN
  Administered 2020-05-11 – 2020-05-13 (×5): 2 mg via ORAL
  Filled 2020-05-11 (×5): qty 1

## 2020-05-11 MED ORDER — LIDOCAINE-PRILOCAINE 2.5-2.5 % EX CREA
TOPICAL_CREAM | Freq: Once | CUTANEOUS | Status: AC
  Filled 2020-05-11: qty 5

## 2020-05-11 MED ORDER — HYDROMORPHONE HCL 2 MG PO TABS
ORAL_TABLET | ORAL | Status: AC
  Administered 2020-05-11: 2 mg via ORAL
  Filled 2020-05-11: qty 1

## 2020-05-11 MED ORDER — CHLORHEXIDINE GLUCONATE CLOTH 2 % EX PADS
6.0000 | MEDICATED_PAD | Freq: Every day | CUTANEOUS | Status: DC
  Administered 2020-05-11 – 2020-05-13 (×2): 6 via TOPICAL

## 2020-05-11 MED ORDER — CHLORHEXIDINE GLUCONATE CLOTH 2 % EX PADS
6.0000 | MEDICATED_PAD | Freq: Every day | CUTANEOUS | Status: DC
  Administered 2020-05-12 – 2020-05-13 (×2): 6 via TOPICAL

## 2020-05-11 MED ORDER — ACETAMINOPHEN 325 MG PO TABS
ORAL_TABLET | ORAL | Status: AC
  Administered 2020-05-11: 650 mg via ORAL
  Filled 2020-05-11: qty 2
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
## 2020-05-11 NOTE — Progress Notes (Signed)
   05/11/20 1840  Assess: if the MEWS score is Yellow or Red  Were vital signs taken at a resting state? Yes  Focused Assessment No change from prior assessment  Early Detection of Sepsis Score *See Row Information* High  MEWS guidelines implemented *See Row Information* Yes  Treat  MEWS Interventions Other (Comment) (monitor vitals)  Pain Scale 0-10  Pain Score 6  Pain Type Acute pain  Pain Location Knee  Pain Orientation Left  Pain Descriptors / Indicators Aching  Take Vital Signs  Increase Vital Sign Frequency  Yellow: Q 2hr X 2 then Q 4hr X 2, if remains yellow, continue Q 4hrs  Escalate  MEWS: Escalate Yellow: discuss with charge nurse/RN and consider discussing with provider and RRT  Notify: Charge Nurse/RN  Name of Charge Nurse/RN Notified  Otila Kluver)  Date Charge Nurse/RN Notified 05/11/20  Time Charge Nurse/RN Notified 1858  Notify: Provider  Provider Name/Title  (Dr Dareen Piano text paged)  Date Provider Notified 05/11/20  Time Provider Notified [phone]  Notification Type Page  Notification Reason Other (Comment)  Response Other (Comment) (waiting for return call)  pt came back from HD in yellow MEWS, MD paged and no new orders given. Will continue monitoring

## 2020-05-11 NOTE — Progress Notes (Signed)
Subjective:   Hospital day: 1  Overnight event: No event overnight  Patient is seen at bedside today.  She is still in acute distress due to pain from her bilateral ankle.  Her breathing is better.  Patient states the pain is mildly controlled with oxycodone and were better controlled with Dilaudid.  She denies abdominal pain and complaining of left knee pain.  Objective:  Vital signs in last 24 hours: Vitals:   05/11/20 0213 05/11/20 0500 05/11/20 0618 05/11/20 1020  BP: 118/62  120/60 103/64  Pulse: (!) 107  (!) 105 81  Resp: $Remo'19  20 18  'cANJw$ Temp: 99.2 F (37.3 C)  99 F (37.2 C) 98.2 F (36.8 C)  TempSrc: Oral  Oral Oral  SpO2: 97%  93% 98%  Weight:  135.6 kg    Height:       CBC Latest Ref Rng & Units 05/11/2020 05/10/2020  WBC 4.0 - 10.5 K/uL 11.8(H) 15.9(H)  Hemoglobin 12.0 - 15.0 g/dL 8.8(L) 9.0(L)  Hematocrit 36 - 46 % 28.3(L) 29.1(L)  Platelets 150 - 400 K/uL 244 261   CMP Latest Ref Rng & Units 05/11/2020 05/10/2020  Glucose 70 - 99 mg/dL 100(H) 88  BUN 6 - 20 mg/dL 87(H) 79(H)  Creatinine 0.44 - 1.00 mg/dL 11.49(H) 10.66(H)  Sodium 135 - 145 mmol/L 136 136  Potassium 3.5 - 5.1 mmol/L 5.1 4.9  Chloride 98 - 111 mmol/L 97(L) 97(L)  CO2 22 - 32 mmol/L 20(L) 17(L)  Calcium 8.9 - 10.3 mg/dL 7.9(L) 8.4(L)  Total Protein 6.5 - 8.1 g/dL - 7.5  Total Bilirubin 0.3 - 1.2 mg/dL - 0.8  Alkaline Phos 38 - 126 U/L - 168(H)  AST 15 - 41 U/L - 25  ALT 0 - 44 U/L - 33     Physical Exam  Physical Exam Constitutional:      General: She is in acute distress.     Appearance: She is obese.  HENT:     Head: Normocephalic.  Eyes:     General:        Right eye: No discharge.        Left eye: No discharge.  Cardiovascular:     Rate and Rhythm: Normal rate and regular rhythm.     Heart sounds: Normal heart sounds.  Pulmonary:     Effort: No respiratory distress.     Breath sounds: Normal breath sounds.  Abdominal:     General: Bowel sounds are normal.    Musculoskeletal:     Cervical back: Normal range of motion.     Right lower leg: Edema present.     Left lower leg: Edema present.     Comments: Swollen erythematous bilateral ankles, worse on the left side.  Multiple scattered violaceous macules from ankle up to bilateral knees.  Neurological:     Mental Status: She is alert.     Assessment/Plan: Christine Horn is a 54 y.o. female with hx of ESRD on HD, PE not on AC, obesity admitted to IMTS on 05/10/20 for management of LLE cellulitis with purpuric lesions and acute respiratory failure 2/2 volume overload.  Principal Problem:   Cellulitis Active Problems:   ESRD on dialysis [facility])   Lower extremity edema    Acute hypoxic respiratory failure secondary to volume overload and fever. ESRD on HD  Patient presents with worsening LE edema and SOB likely due to volume overload. CXR consistent with mild pulmonary edema. Unsure of her cardiac function.  BNP 81.  Patient  currently satting well on room air.  DVT was ruled out, low suspicion for PE.  Nephrology has seen the patient and plan HD today.  Appreciate nephrology recommendation. -Restart home torsemide -Follow-up nephrology recommendations -Monitor O2 sat   Anion gap metabolic acidosis-improving Likely secondary to uremia. Lactate normal.  -Follow-up nephrology recommendations -HD today   LLE cellulitis. Bilateral scattered purpuric lesions. Unclear etiology. Fever Tmax in ED 101.1, white count 16, lactate 1.2. No VTE appreciated on LE duplex.  Differentials includes [facility] spotted fever given her history and location of the rash. Henoch-Schnlein purpura is considered however patient does not have any abdominal pain.  Low suspicion for gout, osteomyelitis or cellulitis given by bilaterality.  Low suspicion for meningitis given lack of headache or neck rigidity. Low suspicion for DRESS given normal eosinophils.  Low suspicion for bacteremia given lactate of 1.2 and normal  blood culture up-to-date.  It is difficult to assess her rash due to bilateral lower extremity swelling but it appears that the rash itself is not painful to palpation.  She is most tender in the top of her bilateral feet, worse on the left side.  Will obtain x-ray of bilateral ankles and left knee. --follow blood cultures, negative up to date --follow [place] spotted serology --Continue doxycycline 100 mg twice daily -Follow fever trend    Normocytic anemia Likely secondary to anemia of chronic disease secondary to CKD.  Her baseline hemoglobin around 9-10. -Pending anemia panel -Monitor CBC   Elevated alkaline phosphatase Normal AST and ALT.  Her alk phos was also elevated dated back to March 2021. -Continue to monitor -Pending phosphorus and calcium acetate level per nephrology  Diet: Renal IVF: N/A VTE: Heparin CODE: Full  Prior to Admission Living Arrangement: home Anticipated Discharge Location: Home Barriers to Discharge: Cellulitis Dispo: Anticipated discharge in approximately 1-2 day(s).   Gaylan Gerold, DO 05/11/2020, 11:47 AM Pager: [phone] After 5pm on weekdays and 1pm on weekends: On Call pager [phone]

## 2020-05-11 NOTE — Progress Notes (Signed)
Pt runs sinus tachy (110s-120s).  She had a 30 second run of SVT (160s) per telemetry. Pt was asymptomatic, but has SOB/dyspnea when moving. Pt had dialysis today and will have dialysis again tomorrow to get back on schedule (T, TH, Sat). Pt was a green MEWS before dialysis and came back a yellow MEWS (See Ben's notes).  Pt concerned about what doctor's will do for her left knee (lateral dislocation).  It causes her a lot of pain to move it.

## 2020-05-11 NOTE — Progress Notes (Signed)
Date: 05/11/2020  Patient name: Christine Horn  Medical record number: [id number]  Date of birth: [birth year]   I have seen and evaluated Christine Horn and discussed their care with the Residency Team.  In brief, patient is a 54 year old female with a past medical history of end-stage renal disease on hemodialysis, PE not currently on anticoagulation, gout, partial colectomy in April who presented to the ED with bilateral lower extremity rash x2 days.  Patient states that last week he went out with her husband in the woods but states that she was wearing jeans at the time as well as boots and does not recall any bug bites.  Approximately 2 days ago she noted bilateral lower extremity swelling as well as some associated shortness of breath and new rash over both her lower extremities.  Patient describes the rash is painful and itchy.  Patient was found to be hypoxic at her PCPs office yesterday and was sent to the ED for further evaluation.  Patient also complained of some itching over her scalp and notes a couple of scabs over her scalp.  No chest pain, no palpitations, no diaphoresis, no lightheadedness, no syncope, no focal weakness, no headache, no blurry vision, no nausea or vomiting, no abdominal pain, diarrhea.  Patient was noted to be febrile up to 101 F in the ED.  Today, patient complains of pain over bilateral lower extremities especially over her left knee.  She notes persistence of the rash as well as swelling in both lower extremities.  PMHx, Fam Hx, and/or Soc Hx : As per resident admit note  Vitals:   05/11/20 1500 05/11/20 1537  BP: 108/68 111/63  Pulse: (!) 110   Resp: 19 19  Temp:    SpO2:     General: Awake, alert, oriented x3, mild distress secondary to pain CVS: Regular rate and rhythm, normal heart sounds Lungs: CTA bilaterally Abdomen: Soft, nontender, nondistended, normoactive bowel sounds Extremities: 2+ bilateral lower extremity pitting edema noted with erythema  over both shins and feet.  Increased local warmth noted over bilateral feet, worse in the left foot.  Tenderness to palpation over left medial foot. HEENT: Normocephalic, atraumatic Psych: Normal mood and affect Skin: Bilateral lower extremity purpuric rash noted over shins and feet not involving the palms or the soles  Assessment and Plan: I have seen and evaluated the patient as outlined above. I agree with the formulated Assessment and Plan as detailed in the residents' note, with the following changes:   1.  Rash, fevers and bilateral lower swelling and pain: -The etiology behind the patient's rash and fevers remains uncertain at this time.  There is concern for possible [facility] spotted fever given patient's history of being out in the woods last week as well as rash over bilateral lower extremities.  However, she has no hyponatremia and her transaminases are within normal limits.  Her rash is also localized to her lower extremities and commonly involves the upper extremities as well in [facility] spotted fever. -Patient was started on doxycycline 100 mg twice daily.  Patient's leukocytosis has improved from 15.9 to 11.8 and patient has remained afebrile since starting antibiotics.  We will continue to monitor closely -Hepatitis B surface antigen and core antibody are negative -X-ray of bilateral ankles shows no evidence of soft tissue swelling/fractures/effusion.  Would consider imaging of her left foot if her tenderness does not improve to rule out an underlying abscess -Blood cultures with no growth to date -We will follow up  serologies but these may be negative secondary to being very early in the disease course -We will consider skin biopsy as well but this will likely take a while to come back -We will follow-up peripheral smear -We will consider ID consult in a.m. if no improvement -Continue with hemodialysis per nephrology  Aldine Contes, MD 11/12/20214:01 PM

## 2020-05-11 NOTE — Progress Notes (Signed)
Went to evaluate patient at bedside after notification of increased MEWS score. She was resting comfortably in bed. Felt tired after HD session. Denies current subjective fevers or chills. No acute concerns other than her knee pain. We informed her that the orthopedic surgeon requested more advanced imaging before deciding on best treatment course.   Will continue current plan of care with empiric antibiotics for RMSF. Follow-up CT scans ordered of LLE.   Modena Nunnery D, DO PGY-3 05/11/20, 11:30 PM

## 2020-05-11 NOTE — Progress Notes (Signed)
Pt came back from dialysis on yellow MEWS. Protocol reinitiated

## 2020-05-12 ENCOUNTER — Inpatient Hospital Stay (HOSPITAL_COMMUNITY): Payer: Medicare Other

## 2020-05-12 LAB — CBC
HCT: 27.5 % — ABNORMAL LOW (ref 36.0–46.0)
Hemoglobin: 8.5 g/dL — ABNORMAL LOW (ref 12.0–15.0)
MCH: 27.7 pg (ref 26.0–34.0)
MCHC: 30.9 g/dL (ref 30.0–36.0)
MCV: 89.6 fL (ref 80.0–100.0)
Platelets: 270 10*3/uL (ref 150–400)
RBC: 3.07 MIL/uL — ABNORMAL LOW (ref 3.87–5.11)
RDW: 18.3 % — ABNORMAL HIGH (ref 11.5–15.5)
WBC: 11.2 10*3/uL — ABNORMAL HIGH (ref 4.0–10.5)
nRBC: 0 % (ref 0.0–0.2)

## 2020-05-12 LAB — BASIC METABOLIC PANEL WITH GFR
Anion gap: 16 — ABNORMAL HIGH (ref 5–15)
BUN: 38 mg/dL — ABNORMAL HIGH (ref 6–20)
CO2: 24 mmol/L (ref 22–32)
Calcium: 8.9 mg/dL (ref 8.9–10.3)
Chloride: 96 mmol/L — ABNORMAL LOW (ref 98–111)
Creatinine, Ser: 7.04 mg/dL — ABNORMAL HIGH (ref 0.44–1.00)
GFR, Estimated: 6 mL/min — ABNORMAL LOW
Glucose, Bld: 100 mg/dL — ABNORMAL HIGH (ref 70–99)
Potassium: 4.4 mmol/L (ref 3.5–5.1)
Sodium: 136 mmol/L (ref 135–145)

## 2020-05-12 LAB — RMSF, IGG, IFA: RMSF, IGG, IFA: 1:128 {titer} — ABNORMAL HIGH

## 2020-05-12 LAB — ROCKY MTN SPOTTED FVR ABS PNL(IGG+IGM)
RMSF IgG: POSITIVE — AB
RMSF IgM: 0.33 index (ref 0.00–0.89)

## 2020-05-12 LAB — HEPATITIS B SURFACE ANTIBODY, QUANTITATIVE: Hep B S AB Quant (Post): 463.1 m[IU]/mL

## 2020-05-12 LAB — GLUCOSE, CAPILLARY: Glucose-Capillary: 122 mg/dL — ABNORMAL HIGH (ref 70–99)

## 2020-05-12 LAB — HIV ANTIBODY (ROUTINE TESTING W REFLEX): HIV Screen 4th Generation wRfx: NONREACTIVE

## 2020-05-12 MED ORDER — ACETAMINOPHEN 325 MG PO TABS
ORAL_TABLET | ORAL | Status: AC
  Administered 2020-05-12: 650 mg via ORAL
  Filled 2020-05-12: qty 2

## 2020-05-12 MED ORDER — HYDROMORPHONE HCL 2 MG PO TABS
ORAL_TABLET | ORAL | Status: AC
  Administered 2020-05-12: 2 mg via ORAL
  Filled 2020-05-12: qty 1

## 2020-05-12 MED ORDER — IOHEXOL 300 MG/ML  SOLN
100.0000 mL | Freq: Once | INTRAMUSCULAR | Status: AC | PRN
  Administered 2020-05-12: 100 mL via INTRAVENOUS

## 2020-05-12 NOTE — Progress Notes (Signed)
Ortho Note  I was called yesterday for a consult regarding this patient and possible left knee patellar dislocation. I felt the imaging was suboptimal. CT scan performed today. She has end-stage tricompartmental arthritis of her knee including her patellofemoral joint. Her knee pain is likely a chronic issue. No inpatient consult is necessary. She may follow up as an outpatient.  Shona Needles, MD [facility] [phone] (office) [email or web address]

## 2020-05-12 NOTE — Progress Notes (Signed)
[facility] NEPHROLOGY PROGRESS NOTE  Assessment/ Plan: Pt is a 54 y.o. yo female  with history of hypertension, PE, ESRD on HD TTS at [facility] in [place], presented with purpuric rash of lower extremities, pain and fever, seen as a consultation for the management of ESRD.  Dialyzes at [facility] in [place] 127 kg Receives heparin.  Left upper extremity AV fistula.  #Lower extremity cellulitis: Duplex rule out DVT.  Currently on  antibiotics doxycycline per primary team.  Getting CT scan of lower extremities. May need input from ID if no improvement.  # ESRD on HD, TTS: Missed dialysis on Thursday therefore had HD yesterday with 5 L UF.  Regular hemodialysis today, try another 5 L today as she is still looks grossly volume up.  Tolerating well so far.  # Hypertension: Blood pressure soft.  I discontinued amlodipine.    Volume management during HD.  # Anemia of ESRD: Hemoglobin low.  Iron saturation 5% however hold IV iron because of sepsis/leg cellulitis, on antibiotics and evaluation ongoing.  Transfuse hemoglobin as needed.  # Metabolic Bone Disease:  Phosphorus level very high.  Increase  calcium acetate dose.  Discussed with primary team.  Subjective: Seen and examined in dialysis unit.  Tolerated dialysis well yesterday and has been tolerating well.  Having some shortness of breath and lower extremity edema.  No nausea vomiting. Objective Vital signs in last 24 hours: Vitals:   05/12/20 0900 05/12/20 0930 05/12/20 1000 05/12/20 1030  BP: (!) 117/58 99/61 (!) 114/57 (!) 105/48  Pulse:      Resp: 19 18 19 19   Temp:      TempSrc:      SpO2:      Weight:      Height:       Weight change: 4.057 kg  Intake/Output Summary (Last 24 hours) at 05/12/2020 1043 Last data filed at 05/11/2020 1723 Gross per 24 hour  Intake --  Output 5000 ml  Net -5000 ml       Labs: Basic Metabolic Panel: Recent Labs  Lab 05/10/20 1422 05/11/20 0058 05/11/20 1425  05/12/20 0219  NA 136 136  --  136  K 4.9 5.1  --  4.4  CL 97* 97*  --  96*  CO2 17* 20*  --  24  GLUCOSE 88 100*  --  100*  BUN 79* 87*  --  38*  CREATININE 10.66* 11.49*  --  7.04*  CALCIUM 8.4* 7.9*  --  8.9  PHOS  --   --  10.7*  --    Liver Function Tests: Recent Labs  Lab 05/10/20 1422  AST 25  ALT 33  ALKPHOS 168*  BILITOT 0.8  PROT 7.5  ALBUMIN 3.1*   No results for input(s): LIPASE, AMYLASE in the last 168 hours. No results for input(s): AMMONIA in the last 168 hours. CBC: Recent Labs  Lab 05/10/20 1422 05/11/20 0058 05/12/20 0219  WBC 15.9* 11.8* 11.2*  NEUTROABS 12.5*  --   --   HGB 9.0* 8.8* 8.5*  HCT 29.1* 28.3* 27.5*  MCV 89.5 89.8 89.6  PLT 261 244 270   Cardiac Enzymes: No results for input(s): CKTOTAL, CKMB, CKMBINDEX, TROPONINI in the last 168 hours. CBG: Recent Labs  Lab 05/11/20 0921  GLUCAP 108*    Iron Studies:  Recent Labs    05/11/20 1425  IRON 18*  TIBC 330  FERRITIN 199   Studies/Results: DG Chest 2 View  Result Date: 05/10/2020 CLINICAL DATA:  Chest tightness, leg swelling EXAM: CHEST - 2 VIEW COMPARISON:  None. FINDINGS: Normal mediastinum and cardiac silhouette. Mild interstitial prominence. No evidence of effusion, infiltrate, or pneumothorax. No acute bony abnormality. IMPRESSION: Mild interstitial edema pattern. Electronically Signed   By: Suzy Bouchard M.D.   On: 05/10/2020 11:58   DG Ankle Complete Left  Result Date: 05/11/2020 CLINICAL DATA:  Left lower extremity pain without known injury. EXAM: LEFT ANKLE COMPLETE - 3+ VIEW COMPARISON:  None. FINDINGS: There is no evidence of fracture, dislocation, or joint effusion. There is no evidence of arthropathy or other focal bone abnormality. Soft tissues are unremarkable. IMPRESSION: Negative. Electronically Signed   By: Marijo Conception M.D.   On: 05/11/2020 12:31   DG Ankle Complete Right  Result Date: 05/11/2020 CLINICAL DATA:  Right lower extremity pain.  No known  injury. EXAM: RIGHT ANKLE - COMPLETE 3+ VIEW COMPARISON:  None. FINDINGS: There is no evidence of fracture, dislocation, or joint effusion. There is no evidence of arthropathy or other focal bone abnormality. Soft tissues are unremarkable. IMPRESSION: Negative. Electronically Signed   By: Marijo Conception M.D.   On: 05/11/2020 12:35   DG Knee Complete 4 Views Left  Result Date: 05/11/2020 CLINICAL DATA:  Left lower leg pain without known injury. EXAM: LEFT KNEE - COMPLETE 4+ VIEW COMPARISON:  None. FINDINGS: There appears to be lateral dislocation of the patella. Joint spaces are unremarkable. No definite fracture is noted. No definite joint effusion is noted. IMPRESSION: Probable lateral dislocation of the patella. Electronically Signed   By: Marijo Conception M.D.   On: 05/11/2020 12:34   VAS [place] LOWER EXTREMITY VENOUS (DVT) (ONLY [facility] & [facility])  Result Date: 05/10/2020  Lower Venous DVT Study Indications: Swelling, Pain, and a sudden onset of red spots on both leg.  Limitations: Body habitus. Comparison Study: No prior. Performing Technologist: Oda Cogan RDMS, RVT  Examination Guidelines: A complete evaluation includes B-mode imaging, spectral Doppler, color Doppler, and power Doppler as needed of all accessible portions of each vessel. Bilateral testing is considered an integral part of a complete examination. Limited examinations for reoccurring indications may be performed as noted. The reflux portion of the exam is performed with the patient in reverse Trendelenburg.  +---------+---------------+---------+-----------+----------+--------------+ RIGHT    CompressibilityPhasicitySpontaneityPropertiesThrombus Aging +---------+---------------+---------+-----------+----------+--------------+ CFV      Full           Yes      Yes                                 +---------+---------------+---------+-----------+----------+--------------+ SFJ      Full                                                         +---------+---------------+---------+-----------+----------+--------------+ FV Prox  Full                                                        +---------+---------------+---------+-----------+----------+--------------+ FV Mid   Full                                                        +---------+---------------+---------+-----------+----------+--------------+  FV DistalFull                                                        +---------+---------------+---------+-----------+----------+--------------+ PFV      Full                                                        +---------+---------------+---------+-----------+----------+--------------+ POP      Full           Yes      Yes                                 +---------+---------------+---------+-----------+----------+--------------+ PTV      Full                                                        +---------+---------------+---------+-----------+----------+--------------+ PERO     Full                                                        +---------+---------------+---------+-----------+----------+--------------+   +---------+---------------+---------+-----------+----------+--------------+ LEFT     CompressibilityPhasicitySpontaneityPropertiesThrombus Aging +---------+---------------+---------+-----------+----------+--------------+ CFV      Full           Yes      Yes                                 +---------+---------------+---------+-----------+----------+--------------+ SFJ      Full                                                        +---------+---------------+---------+-----------+----------+--------------+ FV Prox  Full                                                        +---------+---------------+---------+-----------+----------+--------------+ FV Mid   Full                                                         +---------+---------------+---------+-----------+----------+--------------+ FV DistalFull                                                        +---------+---------------+---------+-----------+----------+--------------+  PFV      Full                                                        +---------+---------------+---------+-----------+----------+--------------+ POP      Full           Yes      Yes                                 +---------+---------------+---------+-----------+----------+--------------+ PTV      Full                                                        +---------+---------------+---------+-----------+----------+--------------+ PERO     Full                                                        +---------+---------------+---------+-----------+----------+--------------+     Summary: BILATERAL: - No evidence of deep vein thrombosis seen in the lower extremities, bilaterally. -No evidence of popliteal cyst, bilaterally.   *See table(s) above for measurements and observations. Electronically signed by Monica Martinez MD on 05/10/2020 at 4:24:23 PM.    Final     Medications: Infusions:   Scheduled Medications: . calcium acetate  1,334 mg Oral TID with meals  . Chlorhexidine Gluconate Cloth  6 each Topical Q0600  . Chlorhexidine Gluconate Cloth  6 each Topical Q0600  . docusate sodium  100 mg Oral BID  . doxycycline  100 mg Oral Q12H  . gabapentin  300 mg Oral TID  . heparin  5,000 Units Subcutaneous Q8H  . pantoprazole  20 mg Oral Daily  . PARoxetine  10 mg Oral Daily  . pramipexole  0.25 mg Oral Daily  . senna  1 tablet Oral BID  . torsemide  100 mg Oral Daily    have reviewed scheduled and prn medications.  Physical Exam: General:NAD, comfortable Heart:RRR, s1s2 nl Lungs: Bibasal rhonchi Abdomen:soft, Non-tender, non-distended Extremities: Bilateral lower extremity has edema, purpuric rash and some tenderness Dialysis Access: Left  upper extremity AV fistula has good thrill and bruit.  Duff Pozzi Prasad Kota Ciancio 05/12/2020,10:43 AM  LOS: 1 day  Pager: [phone]

## 2020-05-12 NOTE — Progress Notes (Signed)
Patient taken down for dialysis via stretcher.

## 2020-05-12 NOTE — Progress Notes (Signed)
   05/12/20 1130  Vitals  Temp 99.4 F (37.4 C)  Temp Source Oral  BP (!) 105/55  MAP (mmHg) 67  BP Location Right Wrist  BP Method Automatic  Patient Position (if appropriate) Lying  Pulse Rate (!) 111  Pulse Rate Source Monitor  ECG Heart Rate (!) 114  Resp (!) 23  Oxygen Therapy  SpO2 95 %  O2 Device Nasal Cannula  O2 Flow Rate (L/min) 2 L/min  Dialysis Weight  Weight 130.2 kg  Type of Weight Post-Dialysis  Post-Hemodialysis Assessment  Rinseback Volume (mL) 250 mL  KECN 289 V  Dialyzer Clearance Lightly streaked  Duration of HD Treatment -hour(s) 4 hour(s)  Hemodialysis Intake (mL) 500 mL  UF Total -Machine (mL) 4500 mL  Net UF (mL) 4000 mL  Tolerated HD Treatment Yes  AVG/AVF Arterial Site Held (minutes) 7 minutes  AVG/AVF Venous Site Held (minutes) 7 minutes  Fistula / Graft Left Upper arm Arteriovenous fistula  No placement date or time found.   Placed prior to admission: Yes  Orientation: Left  Access Location: Upper arm  Access Type: Arteriovenous fistula  Site Condition No complications  Fistula / Graft Assessment Present;Thrill;Bruit  Status Deaccessed

## 2020-05-12 NOTE — Progress Notes (Signed)
   Subjective:   No event overnight  Seen at HD this morning. Pt notes persistent left knee pain but otherwise feels better. Discussed plans for obtaining further imaging.  Objective:  Vital signs in last 24 hours: Vitals:   05/12/20 1100 05/12/20 1115 05/12/20 1130 05/12/20 1217  BP: (!) 119/54 109/67 (!) 105/55 112/64  Pulse: (!) 113 (!) 112 (!) 111 (!) 108  Resp: 16 17 (!) 23   Temp:   99.4 F (37.4 C) 98.9 F (37.2 C)  TempSrc:   Oral Oral  SpO2:   95% 98%  Weight:   130.2 kg   Height:       CBC Latest Ref Rng & Units 05/12/2020 05/11/2020 05/10/2020  WBC 4.0 - 10.5 K/uL 11.2(H) 11.8(H) 15.9(H)  Hemoglobin 12.0 - 15.0 g/dL 8.5(L) 8.8(L) 9.0(L)  Hematocrit 36 - 46 % 27.5(L) 28.3(L) 29.1(L)  Platelets 150 - 400 K/uL 270 244 261   CMP Latest Ref Rng & Units 05/12/2020 05/11/2020 05/10/2020  Glucose 70 - 99 mg/dL 100(H) 100(H) 88  BUN 6 - 20 mg/dL 38(H) 87(H) 79(H)  Creatinine 0.44 - 1.00 mg/dL 7.04(H) 11.49(H) 10.66(H)  Sodium 135 - 145 mmol/L 136 136 136  Potassium 3.5 - 5.1 mmol/L 4.4 5.1 4.9  Chloride 98 - 111 mmol/L 96(L) 97(L) 97(L)  CO2 22 - 32 mmol/L 24 20(L) 17(L)  Calcium 8.9 - 10.3 mg/dL 8.9 7.9(L) 8.4(L)  Total Protein 6.5 - 8.1 g/dL - - 7.5  Total Bilirubin 0.3 - 1.2 mg/dL - - 0.8  Alkaline Phos 38 - 126 U/L - - 168(H)  AST 15 - 41 U/L - - 25  ALT 0 - 44 U/L - - 33     Physical Exam General: chronically ill appearing. Seen in HD Cardiac: persistent edema of the lower extremities. Pulm: breathing comfortably on room air. Skin: there appears to be some improvement in the lower extremity lesions. Erythema on the right side appears to be improving. No new lesions.    Assessment/Plan: Christine Horn is a 54 y.o. female with hx of ESRD on HD, PE not on AC, obesity admitted to IMTS on 05/10/20 for management of LLE cellulitis with purpuric lesions and acute respiratory failure 2/2 volume overload.  Principal Problem:   Cellulitis Active Problems:   ESRD  on dialysis [facility])   Lower extremity edema   [place] Spotted Fever--confirmed on serology Afebrile overnight. Skin lesions appear to be improving. NGTD on blood cultures --follow blood cultures --Continue doxycycline 100 mg twice daily--continue through 11/17 -Follow fever trend   Acute hypoxic respiratory failure secondary to volume overload in the setting of missed HD. ESRD on HD THS Remains severely volume overloaded but improving. Remains on 2L supplemental oxygen --volume management per nephrology --wean supplemental o2 as able --daily RFP  Anemia of CKD. Unclear baseline. Slowly trending down since admission. Will repeat another CBC in the morning. No signs of active bleeding.  Elevated alkaline phosphatase Normal AST and ALT.  Her alk phos was also elevated dated back to March 2021. -Continue to monitor -Pending phosphorus and calcium acetate level per nephrology  Diet: Renal IVF: N/A VTE: Heparin CODE: Full  Prior to Admission Living Arrangement: home Anticipated Discharge Location: Home Barriers to Discharge: Cellulitis Dispo: Can likely discharge home tomorrow if she continues to improve.  [name], MD Internal Medicine Resident PGY-2 [facility] Pager: [phone] 05/12/2020 4:45 PM    After 5pm on weekdays and 1pm on weekends: On Call pager [phone]

## 2020-05-13 ENCOUNTER — Encounter (HOSPITAL_COMMUNITY): Payer: Self-pay | Admitting: Internal Medicine

## 2020-05-13 LAB — GLUCOSE, CAPILLARY: Glucose-Capillary: 118 mg/dL — ABNORMAL HIGH (ref 70–99)

## 2020-05-13 MED ORDER — DICLOFENAC SODIUM 1 % EX GEL
2.0000 g | Freq: Four times a day (QID) | CUTANEOUS | Status: DC
  Administered 2020-05-13: 17:00:00 2 g via TOPICAL
  Filled 2020-05-13: qty 100

## 2020-05-13 MED ORDER — DICLOFENAC SODIUM 1 % EX GEL: 2.0000 g | Freq: Four times a day (QID) | CUTANEOUS | 0 refills | Status: AC

## 2020-05-13 MED ORDER — DOXYCYCLINE HYCLATE 100 MG PO TABS: 100.0000 mg | ORAL_TABLET | Freq: Two times a day (BID) | ORAL | 0 refills | Status: AC

## 2020-05-13 NOTE — Progress Notes (Signed)
   Subjective:  No significant overnight events. Discussed discharge plans.  Objective:  Vital signs in last 24 hours: Vitals:   05/13/20 0348 05/13/20 0528 05/13/20 1017 05/13/20 1336  BP:   132/62 118/68  Pulse: (!) 104  (!) 108 (!) 106  Resp:   20 19  Temp:   98.7 F (37.1 C) 98.9 F (37.2 C)  TempSrc:   Oral Oral  SpO2: 93%  98% 93%  Weight:  134.2 kg    Height:       CBC Latest Ref Rng & Units 05/12/2020 05/11/2020 05/10/2020  WBC 4.0 - 10.5 K/uL 11.2(H) 11.8(H) 15.9(H)  Hemoglobin 12.0 - 15.0 g/dL 8.5(L) 8.8(L) 9.0(L)  Hematocrit 36 - 46 % 27.5(L) 28.3(L) 29.1(L)  Platelets 150 - 400 K/uL 270 244 261   CMP Latest Ref Rng & Units 05/12/2020 05/11/2020 05/10/2020  Glucose 70 - 99 mg/dL 100(H) 100(H) 88  BUN 6 - 20 mg/dL 38(H) 87(H) 79(H)  Creatinine 0.44 - 1.00 mg/dL 7.04(H) 11.49(H) 10.66(H)  Sodium 135 - 145 mmol/L 136 136 136  Potassium 3.5 - 5.1 mmol/L 4.4 5.1 4.9  Chloride 98 - 111 mmol/L 96(L) 97(L) 97(L)  CO2 22 - 32 mmol/L 24 20(L) 17(L)  Calcium 8.9 - 10.3 mg/dL 8.9 7.9(L) 8.4(L)  Total Protein 6.5 - 8.1 g/dL - - 7.5  Total Bilirubin 0.3 - 1.2 mg/dL - - 0.8  Alkaline Phos 38 - 126 U/L - - 168(H)  AST 15 - 41 U/L - - 25  ALT 0 - 44 U/L - - 33     Physical Exam General: chronically ill appearing Skin: lower extremity lesions are improving bilaterally. Erythema on the left lower extremity is receding.   Assessment/Plan: Christine Horn is a 54 y.o. female with hx of ESRD on HD, PE not on AC, obesity admitted to IMTS on 05/10/20 for management of LLE cellulitis with purpuric lesions and acute respiratory failure 2/2 volume overload.  Principal Problem:   Cellulitis Active Problems:   ESRD on dialysis [facility])   Lower extremity edema   Left lower extremity cellulitis. [place] spotted fever labs suggest prior infection (elevated IgG) but negative IgM so unlikely to be the source of the skin lesion. Labs were drawn right around day 8 since she had  been out on the [place]-wheeler so it is possible that the IgM simply had not been produced to yield a positive test. Fortunately, doxycycline is an appropriate treatment for cellulitis and would cover most tick-borne infections. Remains afebrile. Skin lesions appear to be improving and pt reports improvement in pain. NGTD on blood cultures --follow blood cultures --Continue doxycycline 100 mg twice daily--continue through 11/17.  -Follow fever trend   Acute hypoxic respiratory failure secondary to volume overload in the setting of missed HD (resolved)  ESRD on HD THS Remains severely volume overloaded but improving. --volume management per nephrology --daily RFP  Severe Tricompartment arthritis of left knee as noted on imaging this admission. Orthopedics recommends outpatient follow up.  Anemia of CKD. Unclear baseline. Slowly trending down since admission. Will repeat another CBC in the morning. No signs of active bleeding.  Diet: Renal IVF: N/A VTE: Heparin CODE: Full  Prior to Admission Living Arrangement: home Anticipated Discharge Location: Home Barriers to Discharge: none Dispo: Discharge home today  [name], MD Internal Medicine Resident PGY-2 [facility] Pager: [phone] 05/13/2020 2:43 PM    After 5pm on weekdays and 1pm on weekends: On Call pager [phone]
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
## 2020-05-13 NOTE — Progress Notes (Signed)
[facility] NEPHROLOGY PROGRESS NOTE  Assessment/ Plan: Pt is a 54 y.o. yo female  with history of hypertension, PE, ESRD on HD TTS at [facility] in [place], presented with purpuric rash of lower extremities, pain and fever, seen as a consultation for the management of ESRD.  Dialyzes at [facility] in [place] 127 kg Receives heparin.  Left upper extremity AV fistula.  #Lower extremity cellulitis: Duplex rule out DVT.  Currently on  antibiotics doxycycline per primary team.  RMSF IgM negative however IgG positive.  Rash is improving.  Per primary team.  # ESRD on HD, TTS: Missed dialysis on Thursday therefore had serial HD on 11/12 and 11/13 with 5 L UF each days.  Plan for next hemodialysis on Tuesday which can be done as outpatient if discharged.  # Hypertension:  Amlodipine discontinued because of soft blood pressure, BP now improved.  Volume management during HD.  # Anemia of ESRD: Hemoglobin low.  Iron saturation 5% however hold IV iron because of sepsis/leg cellulitis, on antibiotics and evaluation ongoing.  Transfuse hemoglobin as needed.  May consider iron and ESA as outpatient at HD unit.  # Metabolic Bone Disease:  Phosphorus level very high.  Increased calcium acetate dose.  Discussed with primary team. Okay to discharge from renal perspective.  Subjective: Seen and examined.  The rest are improving.  Denies nausea vomiting chest pain shortness of breath. Objective Vital signs in last 24 hours: Vitals:   05/13/20 0347 05/13/20 0348 05/13/20 0528 05/13/20 1017  BP: 138/68   132/62  Pulse: (!) 103 (!) 104  (!) 108  Resp: 19   20  Temp: 98.6 F (37 C)   98.7 F (37.1 C)  TempSrc: Oral   Oral  SpO2: (!) 83% 93%  98%  Weight:   134.2 kg   Height:       Weight change: -5.4 kg No intake or output data in the 24 hours ending 05/13/20 1146     Labs: Basic Metabolic Panel: Recent Labs  Lab 05/10/20 1422 05/11/20 0058 05/11/20 1425 05/12/20 0219   NA 136 136  --  136  K 4.9 5.1  --  4.4  CL 97* 97*  --  96*  CO2 17* 20*  --  24  GLUCOSE 88 100*  --  100*  BUN 79* 87*  --  38*  CREATININE 10.66* 11.49*  --  7.04*  CALCIUM 8.4* 7.9*  --  8.9  PHOS  --   --  10.7*  --    Liver Function Tests: Recent Labs  Lab 05/10/20 1422  AST 25  ALT 33  ALKPHOS 168*  BILITOT 0.8  PROT 7.5  ALBUMIN 3.1*   No results for input(s): LIPASE, AMYLASE in the last 168 hours. No results for input(s): AMMONIA in the last 168 hours. CBC: Recent Labs  Lab 05/10/20 1422 05/11/20 0058 05/12/20 0219  WBC 15.9* 11.8* 11.2*  NEUTROABS 12.5*  --   --   HGB 9.0* 8.8* 8.5*  HCT 29.1* 28.3* 27.5*  MCV 89.5 89.8 89.6  PLT 261 244 270   Cardiac Enzymes: No results for input(s): CKTOTAL, CKMB, CKMBINDEX, TROPONINI in the last 168 hours. CBG: Recent Labs  Lab 05/11/20 0921 05/12/20 1208 05/13/20 0734  GLUCAP 108* 122* 118*    Iron Studies:  Recent Labs    05/11/20 1425  IRON 18*  TIBC 330  FERRITIN 199   Studies/Results: DG Ankle Complete Left  Result Date: 05/11/2020 CLINICAL DATA:  Left  lower extremity pain without known injury. EXAM: LEFT ANKLE COMPLETE - 3+ VIEW COMPARISON:  None. FINDINGS: There is no evidence of fracture, dislocation, or joint effusion. There is no evidence of arthropathy or other focal bone abnormality. Soft tissues are unremarkable. IMPRESSION: Negative. Electronically Signed   By: Marijo Conception M.D.   On: 05/11/2020 12:31   DG Ankle Complete Right  Result Date: 05/11/2020 CLINICAL DATA:  Right lower extremity pain.  No known injury. EXAM: RIGHT ANKLE - COMPLETE 3+ VIEW COMPARISON:  None. FINDINGS: There is no evidence of fracture, dislocation, or joint effusion. There is no evidence of arthropathy or other focal bone abnormality. Soft tissues are unremarkable. IMPRESSION: Negative. Electronically Signed   By: Marijo Conception M.D.   On: 05/11/2020 12:35   CT KNEE LEFT WO CONTRAST  Result Date:  05/12/2020 CLINICAL DATA:  Chronic knee pain, radiographs showed possible patellar dislocation. EXAM: CT OF THE left KNEE WITHOUT CONTRAST TECHNIQUE: Multidetector CT imaging of the left knee was performed according to the standard protocol. Multiplanar CT image reconstructions were also generated. COMPARISON:  05/11/2020 radiograph FINDINGS: Bones/Joint/Cartilage Severe patellofemoral arthropathy with prominently irregular patellar and femoral trochlear groove cortical surfaces and associated marginal spurring. No findings of dislocation. Somewhat shallow femoral trochlear groove may predispose to maltracking. Marginal irregularity may represent erosions. Severe chondral thinning in the medial compartment with subcortical eburnation and subcortical cyst formation along with marginal spurring. Marginal spurring in the lateral compartment. There is spurring of the tibial spine and along the intercondylar notch. No acute fracture.  Trace knee joint effusion. Ligaments Suboptimally assessed by CT. Muscles and Tendons Atrophic semimembranosus muscle with notable fatty replacement. Distal SFA, popliteal artery, and proximal calf arterial atherosclerosis. Soft tissues Patchy densities with speckled calcifications throughout the subcutaneous tissues, as can be seen in a variety of conditions including chronic venous insufficiency, dermatomyositis, lupus, and a variety of other conditions. IMPRESSION: 1. Severe patellofemoral arthropathy, with prominently irregular patellar and femoral trochlear groove cortical surfaces and associated marginal spurring. Marginal irregularity may represent erosions or a manifestation of severe degenerative osteoarthritis. 2. shallow femoral trochlear groove may predispose to maltracking. 3. Trace knee joint effusion. 4. Atrophic semimembranosus muscle with notable fatty replacement. 5. Distal SFA, popliteal artery, and proximal calf arterial atherosclerosis. 6. Patchy densities with  speckled calcifications throughout the subcutaneous tissues, as can be seen in a variety of conditions including chronic venous insufficiency, dermatomyositis, lupus, and a variety of other conditions. Electronically Signed   By: Van Clines M.D.   On: 05/12/2020 13:42   CT TIBIA FIBULA LEFT W CONTRAST  Result Date: 05/12/2020 CLINICAL DATA:  Possible osteomyelitis. EXAM: CT OF THE LOWER RIGHT EXTREMITY WITH CONTRAST (TIBIA-FIBULA) TECHNIQUE: Multidetector CT imaging of the lower right extremity was performed according to the standard protocol following intravenous contrast administration. COMPARISON:  None. CONTRAST:  19mL OMNIPAQUE IOHEXOL 300 MG/ML  SOLN FINDINGS: Bones/Joint/Cartilage Knee findings discussed under dedicated CT of the knee. No findings of osteomyelitis involving the tibia or fibula. There is some confluent degenerative subcortical cyst formation in the medial tibial plateau. Ligaments Suboptimally assessed by CT. Muscles and Tendons Distal semimembranosus fatty atrophy. The remainder of the calf musculature appears unremarkable. Mild anterior convexity of the distal Achilles tendon suggesting Achilles tendinopathy. Soft tissues No abscess or drainable collection. Speckled reticular calcifications in the subcutaneous tissues against a background of diffuse subcutaneous infiltration, probably from chronic venous insufficiency, less likely entity such as dermatomyositis or collagen vascular disease. IMPRESSION: 1. No findings of  osteomyelitis involving the tibia or fibula. 2. Speckled reticular calcifications in the subcutaneous tissues against a background of diffuse subcutaneous infiltration, probably from chronic venous insufficiency, less likely entity such as dermatomyositis or collagen vascular disease. 3. Distal semimembranosus fatty atrophy. 4. Mild anterior convexity of the distal Achilles tendon suggesting Achilles tendinopathy. 5. Degenerative subcortical cyst formation in  the medial tibial plateau. Electronically Signed   By: Van Clines M.D.   On: 05/12/2020 13:45   CT FOOT LEFT W CONTRAST  Result Date: 05/12/2020 CLINICAL DATA:  Foot swelling, query osteomyelitis. EXAM: CT OF THE LOWER LEFT EXTREMITY WITH CONTRAST (left foot) TECHNIQUE: Multidetector CT imaging of the lower left extremity was performed according to the standard protocol following intravenous contrast administration. COMPARISON:  Radiographs 05/11/2020 CONTRAST:  146mL OMNIPAQUE IOHEXOL 300 MG/ML  SOLN FINDINGS: Bones/Joint/Cartilage There is a large spur like peroneal tubercle on image 29 of series 3 separating the peroneus longus and brevis tendons. No bony destructive findings characteristic of osteomyelitis. Large plantar and Achilles calcaneal spurs. Dorsal midfoot spurring. Small well corticated bony fragments along the lateral plantar base of the proximal phalanx of the second toe on images 33 through 37 of series 7, probably from a remote avulsion injury. No malalignment at the Lisfranc joint. There is some degenerative findings for example along the medial base of the fourth metatarsal and image weaned the lateral cuneiform and the cuboid. Ligaments Suboptimally assessed by CT. Muscles and Tendons Mild distal Achilles expansion compatible with Achilles tendinopathy. Mildly thickened appearance of the peroneus longus tendon adjacent to the prominent peroneal tubercle, probably reflecting tendinopathy. Thickened proximal plantar fascia, cannot exclude plantar fasciitis. Soft tissues No abscess or drainable fluid collection. There is edema tracking over both malleoli and in the dorsum of the ankle, which tracks distally into the dorsum of the foot; cellulitis is not excluded. IMPRESSION: 1. No bony destructive findings characteristic of osteomyelitis. 2. There is edema tracking over both malleoli and in the dorsum of the ankle, which tracks distally into the dorsum of the foot; cellulitis is not  excluded. 3. Large spur like peroneal tubercle separating the peroneus longus and brevis tendons. Possible mild peroneus longus tendinopathy. 4. Achilles tendinopathy. 5. Small well corticated bony fragments along the lateral plantar base of the proximal phalanx of the second toe, probably from a remote avulsion injury. 6. Thickened proximal plantar fascia, cannot exclude plantar fasciitis. Electronically Signed   By: Van Clines M.D.   On: 05/12/2020 13:51   DG Knee Complete 4 Views Left  Result Date: 05/11/2020 CLINICAL DATA:  Left lower leg pain without known injury. EXAM: LEFT KNEE - COMPLETE 4+ VIEW COMPARISON:  None. FINDINGS: There appears to be lateral dislocation of the patella. Joint spaces are unremarkable. No definite fracture is noted. No definite joint effusion is noted. IMPRESSION: Probable lateral dislocation of the patella. Electronically Signed   By: Marijo Conception M.D.   On: 05/11/2020 12:34    Medications: Infusions:   Scheduled Medications: . calcium acetate  1,334 mg Oral TID with meals  . Chlorhexidine Gluconate Cloth  6 each Topical Q0600  . Chlorhexidine Gluconate Cloth  6 each Topical Q0600  . docusate sodium  100 mg Oral BID  . doxycycline  100 mg Oral Q12H  . gabapentin  300 mg Oral TID  . heparin  5,000 Units Subcutaneous Q8H  . pantoprazole  20 mg Oral Daily  . PARoxetine  10 mg Oral Daily  . pramipexole  0.25 mg Oral Daily  .  senna  1 tablet Oral BID  . torsemide  100 mg Oral Daily    have reviewed scheduled and prn medications.  Physical Exam: General:NAD, comfortable Heart:RRR, s1s2 nl Lungs: Clear bilateral, no wheezing Abdomen:soft, Non-tender, non-distended Extremities: Bilateral lower extremity edema and purpuric rash improving. Dialysis Access: Left upper extremity AV fistula has good thrill and bruit.  Veverly Larimer Prasad Deshon Koslowski 05/13/2020,11:46 AM  LOS: 2 days  Pager: [phone]

## 2020-05-14 LAB — PATHOLOGIST SMEAR REVIEW

## 2020-05-15 DIAGNOSIS — D631 Anemia in chronic kidney disease: Secondary | ICD-10-CM | POA: Diagnosis not present

## 2020-05-15 DIAGNOSIS — Z992 Dependence on renal dialysis: Secondary | ICD-10-CM | POA: Diagnosis not present

## 2020-05-15 DIAGNOSIS — I509 Heart failure, unspecified: Secondary | ICD-10-CM | POA: Diagnosis not present

## 2020-05-15 DIAGNOSIS — D509 Iron deficiency anemia, unspecified: Secondary | ICD-10-CM | POA: Diagnosis not present

## 2020-05-15 DIAGNOSIS — N2581 Secondary hyperparathyroidism of renal origin: Secondary | ICD-10-CM | POA: Diagnosis not present

## 2020-05-15 DIAGNOSIS — N186 End stage renal disease: Secondary | ICD-10-CM | POA: Diagnosis not present

## 2020-05-15 LAB — CULTURE, BLOOD (ROUTINE X 2)
Culture: NO GROWTH
Culture: NO GROWTH
Special Requests: ADEQUATE

## 2020-05-17 DIAGNOSIS — Z992 Dependence on renal dialysis: Secondary | ICD-10-CM | POA: Diagnosis not present

## 2020-05-17 DIAGNOSIS — N186 End stage renal disease: Secondary | ICD-10-CM | POA: Diagnosis not present

## 2020-05-17 DIAGNOSIS — I509 Heart failure, unspecified: Secondary | ICD-10-CM | POA: Diagnosis not present

## 2020-05-17 DIAGNOSIS — D631 Anemia in chronic kidney disease: Secondary | ICD-10-CM | POA: Diagnosis not present

## 2020-05-17 DIAGNOSIS — D509 Iron deficiency anemia, unspecified: Secondary | ICD-10-CM | POA: Diagnosis not present

## 2020-05-17 DIAGNOSIS — N2581 Secondary hyperparathyroidism of renal origin: Secondary | ICD-10-CM | POA: Diagnosis not present

## 2020-05-18 DIAGNOSIS — Z7689 Persons encountering health services in other specified circumstances: Secondary | ICD-10-CM | POA: Diagnosis not present

## 2020-05-18 DIAGNOSIS — N186 End stage renal disease: Secondary | ICD-10-CM | POA: Diagnosis not present

## 2020-05-18 DIAGNOSIS — L03116 Cellulitis of left lower limb: Secondary | ICD-10-CM | POA: Diagnosis not present

## 2020-05-18 DIAGNOSIS — Z6841 Body Mass Index (BMI) 40.0 and over, adult: Secondary | ICD-10-CM | POA: Diagnosis not present

## 2020-05-18 DIAGNOSIS — M1712 Unilateral primary osteoarthritis, left knee: Secondary | ICD-10-CM | POA: Diagnosis not present

## 2020-05-19 DIAGNOSIS — D509 Iron deficiency anemia, unspecified: Secondary | ICD-10-CM | POA: Diagnosis not present

## 2020-05-19 DIAGNOSIS — I509 Heart failure, unspecified: Secondary | ICD-10-CM | POA: Diagnosis not present

## 2020-05-19 DIAGNOSIS — N186 End stage renal disease: Secondary | ICD-10-CM | POA: Diagnosis not present

## 2020-05-19 DIAGNOSIS — N2581 Secondary hyperparathyroidism of renal origin: Secondary | ICD-10-CM | POA: Diagnosis not present

## 2020-05-19 DIAGNOSIS — Z992 Dependence on renal dialysis: Secondary | ICD-10-CM | POA: Diagnosis not present

## 2020-05-19 DIAGNOSIS — D631 Anemia in chronic kidney disease: Secondary | ICD-10-CM | POA: Diagnosis not present

## 2020-05-21 DIAGNOSIS — D631 Anemia in chronic kidney disease: Secondary | ICD-10-CM | POA: Diagnosis not present

## 2020-05-21 DIAGNOSIS — D509 Iron deficiency anemia, unspecified: Secondary | ICD-10-CM | POA: Diagnosis not present

## 2020-05-21 DIAGNOSIS — Z992 Dependence on renal dialysis: Secondary | ICD-10-CM | POA: Diagnosis not present

## 2020-05-21 DIAGNOSIS — N2581 Secondary hyperparathyroidism of renal origin: Secondary | ICD-10-CM | POA: Diagnosis not present

## 2020-05-21 DIAGNOSIS — N186 End stage renal disease: Secondary | ICD-10-CM | POA: Diagnosis not present

## 2020-05-21 DIAGNOSIS — I509 Heart failure, unspecified: Secondary | ICD-10-CM | POA: Diagnosis not present

## 2020-05-22 DIAGNOSIS — I509 Heart failure, unspecified: Secondary | ICD-10-CM | POA: Diagnosis not present

## 2020-05-22 DIAGNOSIS — N2581 Secondary hyperparathyroidism of renal origin: Secondary | ICD-10-CM | POA: Diagnosis not present

## 2020-05-22 DIAGNOSIS — D631 Anemia in chronic kidney disease: Secondary | ICD-10-CM | POA: Diagnosis not present

## 2020-05-22 DIAGNOSIS — N186 End stage renal disease: Secondary | ICD-10-CM | POA: Diagnosis not present

## 2020-05-22 DIAGNOSIS — D509 Iron deficiency anemia, unspecified: Secondary | ICD-10-CM | POA: Diagnosis not present

## 2020-05-22 DIAGNOSIS — Z992 Dependence on renal dialysis: Secondary | ICD-10-CM | POA: Diagnosis not present

## 2020-05-25 DIAGNOSIS — N2581 Secondary hyperparathyroidism of renal origin: Secondary | ICD-10-CM | POA: Diagnosis not present

## 2020-05-25 DIAGNOSIS — D631 Anemia in chronic kidney disease: Secondary | ICD-10-CM | POA: Diagnosis not present

## 2020-05-25 DIAGNOSIS — Z992 Dependence on renal dialysis: Secondary | ICD-10-CM | POA: Diagnosis not present

## 2020-05-25 DIAGNOSIS — I509 Heart failure, unspecified: Secondary | ICD-10-CM | POA: Diagnosis not present

## 2020-05-25 DIAGNOSIS — N186 End stage renal disease: Secondary | ICD-10-CM | POA: Diagnosis not present

## 2020-05-25 DIAGNOSIS — D509 Iron deficiency anemia, unspecified: Secondary | ICD-10-CM | POA: Diagnosis not present

## 2020-05-26 DIAGNOSIS — Z992 Dependence on renal dialysis: Secondary | ICD-10-CM | POA: Diagnosis not present

## 2020-05-26 DIAGNOSIS — N2581 Secondary hyperparathyroidism of renal origin: Secondary | ICD-10-CM | POA: Diagnosis not present

## 2020-05-26 DIAGNOSIS — D509 Iron deficiency anemia, unspecified: Secondary | ICD-10-CM | POA: Diagnosis not present

## 2020-05-26 DIAGNOSIS — N186 End stage renal disease: Secondary | ICD-10-CM | POA: Diagnosis not present

## 2020-05-26 DIAGNOSIS — I509 Heart failure, unspecified: Secondary | ICD-10-CM | POA: Diagnosis not present

## 2020-05-26 DIAGNOSIS — D631 Anemia in chronic kidney disease: Secondary | ICD-10-CM | POA: Diagnosis not present

## 2020-05-28 DIAGNOSIS — L959 Vasculitis limited to the skin, unspecified: Secondary | ICD-10-CM | POA: Diagnosis not present

## 2020-05-28 DIAGNOSIS — Z6841 Body Mass Index (BMI) 40.0 and over, adult: Secondary | ICD-10-CM | POA: Diagnosis not present

## 2020-05-29 DIAGNOSIS — R197 Diarrhea, unspecified: Secondary | ICD-10-CM | POA: Diagnosis not present

## 2020-05-29 DIAGNOSIS — Z6841 Body Mass Index (BMI) 40.0 and over, adult: Secondary | ICD-10-CM | POA: Diagnosis not present

## 2020-05-29 DIAGNOSIS — D509 Iron deficiency anemia, unspecified: Secondary | ICD-10-CM | POA: Diagnosis not present

## 2020-05-29 DIAGNOSIS — I509 Heart failure, unspecified: Secondary | ICD-10-CM | POA: Diagnosis not present

## 2020-05-29 DIAGNOSIS — Z86711 Personal history of pulmonary embolism: Secondary | ICD-10-CM | POA: Diagnosis not present

## 2020-05-29 DIAGNOSIS — I5020 Unspecified systolic (congestive) heart failure: Secondary | ICD-10-CM | POA: Diagnosis not present

## 2020-05-29 DIAGNOSIS — K649 Unspecified hemorrhoids: Secondary | ICD-10-CM | POA: Diagnosis present

## 2020-05-29 DIAGNOSIS — E8770 Fluid overload, unspecified: Secondary | ICD-10-CM | POA: Diagnosis not present

## 2020-05-29 DIAGNOSIS — Z79899 Other long term (current) drug therapy: Secondary | ICD-10-CM | POA: Diagnosis not present

## 2020-05-29 DIAGNOSIS — R1030 Lower abdominal pain, unspecified: Secondary | ICD-10-CM | POA: Diagnosis not present

## 2020-05-29 DIAGNOSIS — M1A9XX0 Chronic gout, unspecified, without tophus (tophi): Secondary | ICD-10-CM | POA: Diagnosis present

## 2020-05-29 DIAGNOSIS — E875 Hyperkalemia: Secondary | ICD-10-CM | POA: Diagnosis not present

## 2020-05-29 DIAGNOSIS — I120 Hypertensive chronic kidney disease with stage 5 chronic kidney disease or end stage renal disease: Secondary | ICD-10-CM | POA: Diagnosis not present

## 2020-05-29 DIAGNOSIS — M25562 Pain in left knee: Secondary | ICD-10-CM | POA: Diagnosis not present

## 2020-05-29 DIAGNOSIS — Z992 Dependence on renal dialysis: Secondary | ICD-10-CM | POA: Diagnosis not present

## 2020-05-29 DIAGNOSIS — R21 Rash and other nonspecific skin eruption: Secondary | ICD-10-CM | POA: Diagnosis present

## 2020-05-29 DIAGNOSIS — R739 Hyperglycemia, unspecified: Secondary | ICD-10-CM | POA: Diagnosis present

## 2020-05-29 DIAGNOSIS — N186 End stage renal disease: Secondary | ICD-10-CM | POA: Diagnosis not present

## 2020-05-29 DIAGNOSIS — D72829 Elevated white blood cell count, unspecified: Secondary | ICD-10-CM | POA: Diagnosis not present

## 2020-05-29 DIAGNOSIS — R110 Nausea: Secondary | ICD-10-CM | POA: Diagnosis not present

## 2020-05-29 DIAGNOSIS — K625 Hemorrhage of anus and rectum: Secondary | ICD-10-CM | POA: Diagnosis not present

## 2020-05-29 DIAGNOSIS — I517 Cardiomegaly: Secondary | ICD-10-CM | POA: Diagnosis not present

## 2020-05-29 DIAGNOSIS — I361 Nonrheumatic tricuspid (valve) insufficiency: Secondary | ICD-10-CM | POA: Diagnosis not present

## 2020-05-29 DIAGNOSIS — G2581 Restless legs syndrome: Secondary | ICD-10-CM | POA: Diagnosis present

## 2020-05-29 DIAGNOSIS — D631 Anemia in chronic kidney disease: Secondary | ICD-10-CM | POA: Diagnosis not present

## 2020-05-29 DIAGNOSIS — M1712 Unilateral primary osteoarthritis, left knee: Secondary | ICD-10-CM | POA: Diagnosis present

## 2020-05-29 DIAGNOSIS — R0602 Shortness of breath: Secondary | ICD-10-CM | POA: Diagnosis not present

## 2020-05-29 DIAGNOSIS — N2581 Secondary hyperparathyroidism of renal origin: Secondary | ICD-10-CM | POA: Diagnosis not present

## 2020-05-29 DIAGNOSIS — K219 Gastro-esophageal reflux disease without esophagitis: Secondary | ICD-10-CM | POA: Diagnosis not present

## 2020-05-29 DIAGNOSIS — I10 Essential (primary) hypertension: Secondary | ICD-10-CM | POA: Diagnosis not present

## 2020-05-30 DIAGNOSIS — N186 End stage renal disease: Secondary | ICD-10-CM | POA: Diagnosis not present

## 2020-05-30 DIAGNOSIS — Z992 Dependence on renal dialysis: Secondary | ICD-10-CM | POA: Diagnosis not present

## 2020-06-02 DIAGNOSIS — N2581 Secondary hyperparathyroidism of renal origin: Secondary | ICD-10-CM | POA: Diagnosis not present

## 2020-06-02 DIAGNOSIS — R110 Nausea: Secondary | ICD-10-CM | POA: Diagnosis not present

## 2020-06-02 DIAGNOSIS — N186 End stage renal disease: Secondary | ICD-10-CM | POA: Diagnosis not present

## 2020-06-02 DIAGNOSIS — D509 Iron deficiency anemia, unspecified: Secondary | ICD-10-CM | POA: Diagnosis not present

## 2020-06-02 DIAGNOSIS — Z992 Dependence on renal dialysis: Secondary | ICD-10-CM | POA: Diagnosis not present

## 2020-06-02 DIAGNOSIS — D631 Anemia in chronic kidney disease: Secondary | ICD-10-CM | POA: Diagnosis not present

## 2020-06-05 DIAGNOSIS — R110 Nausea: Secondary | ICD-10-CM | POA: Diagnosis not present

## 2020-06-05 DIAGNOSIS — N186 End stage renal disease: Secondary | ICD-10-CM | POA: Diagnosis not present

## 2020-06-05 DIAGNOSIS — D631 Anemia in chronic kidney disease: Secondary | ICD-10-CM | POA: Diagnosis not present

## 2020-06-05 DIAGNOSIS — D509 Iron deficiency anemia, unspecified: Secondary | ICD-10-CM | POA: Diagnosis not present

## 2020-06-05 DIAGNOSIS — Z992 Dependence on renal dialysis: Secondary | ICD-10-CM | POA: Diagnosis not present

## 2020-06-05 DIAGNOSIS — N2581 Secondary hyperparathyroidism of renal origin: Secondary | ICD-10-CM | POA: Diagnosis not present

## 2020-06-07 DIAGNOSIS — R110 Nausea: Secondary | ICD-10-CM | POA: Diagnosis not present

## 2020-06-07 DIAGNOSIS — D631 Anemia in chronic kidney disease: Secondary | ICD-10-CM | POA: Diagnosis not present

## 2020-06-07 DIAGNOSIS — Z992 Dependence on renal dialysis: Secondary | ICD-10-CM | POA: Diagnosis not present

## 2020-06-07 DIAGNOSIS — N186 End stage renal disease: Secondary | ICD-10-CM | POA: Diagnosis not present

## 2020-06-07 DIAGNOSIS — N2581 Secondary hyperparathyroidism of renal origin: Secondary | ICD-10-CM | POA: Diagnosis not present

## 2020-06-07 DIAGNOSIS — D509 Iron deficiency anemia, unspecified: Secondary | ICD-10-CM | POA: Diagnosis not present

## 2020-06-08 DIAGNOSIS — M25562 Pain in left knee: Secondary | ICD-10-CM | POA: Diagnosis not present

## 2020-06-08 DIAGNOSIS — M1712 Unilateral primary osteoarthritis, left knee: Secondary | ICD-10-CM | POA: Diagnosis not present

## 2020-06-08 DIAGNOSIS — N186 End stage renal disease: Secondary | ICD-10-CM | POA: Diagnosis not present

## 2020-06-09 DIAGNOSIS — D509 Iron deficiency anemia, unspecified: Secondary | ICD-10-CM | POA: Diagnosis not present

## 2020-06-09 DIAGNOSIS — Z992 Dependence on renal dialysis: Secondary | ICD-10-CM | POA: Diagnosis not present

## 2020-06-09 DIAGNOSIS — N186 End stage renal disease: Secondary | ICD-10-CM | POA: Diagnosis not present

## 2020-06-09 DIAGNOSIS — N2581 Secondary hyperparathyroidism of renal origin: Secondary | ICD-10-CM | POA: Diagnosis not present

## 2020-06-09 DIAGNOSIS — R110 Nausea: Secondary | ICD-10-CM | POA: Diagnosis not present

## 2020-06-09 DIAGNOSIS — D631 Anemia in chronic kidney disease: Secondary | ICD-10-CM | POA: Diagnosis not present

## 2020-06-12 DIAGNOSIS — D509 Iron deficiency anemia, unspecified: Secondary | ICD-10-CM | POA: Diagnosis not present

## 2020-06-12 DIAGNOSIS — N186 End stage renal disease: Secondary | ICD-10-CM | POA: Diagnosis not present

## 2020-06-12 DIAGNOSIS — Z992 Dependence on renal dialysis: Secondary | ICD-10-CM | POA: Diagnosis not present

## 2020-06-12 DIAGNOSIS — N2581 Secondary hyperparathyroidism of renal origin: Secondary | ICD-10-CM | POA: Diagnosis not present

## 2020-06-12 DIAGNOSIS — R110 Nausea: Secondary | ICD-10-CM | POA: Diagnosis not present

## 2020-06-12 DIAGNOSIS — D631 Anemia in chronic kidney disease: Secondary | ICD-10-CM | POA: Diagnosis not present

## 2020-06-14 DIAGNOSIS — N2581 Secondary hyperparathyroidism of renal origin: Secondary | ICD-10-CM | POA: Diagnosis not present

## 2020-06-14 DIAGNOSIS — D631 Anemia in chronic kidney disease: Secondary | ICD-10-CM | POA: Diagnosis not present

## 2020-06-14 DIAGNOSIS — R110 Nausea: Secondary | ICD-10-CM | POA: Diagnosis not present

## 2020-06-14 DIAGNOSIS — N186 End stage renal disease: Secondary | ICD-10-CM | POA: Diagnosis not present

## 2020-06-14 DIAGNOSIS — D509 Iron deficiency anemia, unspecified: Secondary | ICD-10-CM | POA: Diagnosis not present

## 2020-06-14 DIAGNOSIS — Z992 Dependence on renal dialysis: Secondary | ICD-10-CM | POA: Diagnosis not present

## 2020-06-16 DIAGNOSIS — N186 End stage renal disease: Secondary | ICD-10-CM | POA: Diagnosis not present

## 2020-06-16 DIAGNOSIS — R110 Nausea: Secondary | ICD-10-CM | POA: Diagnosis not present

## 2020-06-16 DIAGNOSIS — D509 Iron deficiency anemia, unspecified: Secondary | ICD-10-CM | POA: Diagnosis not present

## 2020-06-16 DIAGNOSIS — N2581 Secondary hyperparathyroidism of renal origin: Secondary | ICD-10-CM | POA: Diagnosis not present

## 2020-06-16 DIAGNOSIS — Z992 Dependence on renal dialysis: Secondary | ICD-10-CM | POA: Diagnosis not present

## 2020-06-16 DIAGNOSIS — D631 Anemia in chronic kidney disease: Secondary | ICD-10-CM | POA: Diagnosis not present

## 2020-06-18 DIAGNOSIS — S59902A Unspecified injury of left elbow, initial encounter: Secondary | ICD-10-CM | POA: Diagnosis not present

## 2020-06-18 DIAGNOSIS — M25462 Effusion, left knee: Secondary | ICD-10-CM | POA: Diagnosis not present

## 2020-06-18 DIAGNOSIS — S8992XA Unspecified injury of left lower leg, initial encounter: Secondary | ICD-10-CM | POA: Diagnosis not present

## 2020-06-18 DIAGNOSIS — M25522 Pain in left elbow: Secondary | ICD-10-CM | POA: Diagnosis not present

## 2020-06-18 DIAGNOSIS — M1712 Unilateral primary osteoarthritis, left knee: Secondary | ICD-10-CM | POA: Diagnosis not present

## 2020-06-18 DIAGNOSIS — M25562 Pain in left knee: Secondary | ICD-10-CM | POA: Diagnosis not present

## 2020-06-20 DIAGNOSIS — R0902 Hypoxemia: Secondary | ICD-10-CM | POA: Diagnosis not present

## 2020-06-20 DIAGNOSIS — K219 Gastro-esophageal reflux disease without esophagitis: Secondary | ICD-10-CM | POA: Diagnosis not present

## 2020-06-20 DIAGNOSIS — I10 Essential (primary) hypertension: Secondary | ICD-10-CM | POA: Diagnosis not present

## 2020-06-20 DIAGNOSIS — R42 Dizziness and giddiness: Secondary | ICD-10-CM | POA: Diagnosis not present

## 2020-06-20 DIAGNOSIS — G2581 Restless legs syndrome: Secondary | ICD-10-CM | POA: Diagnosis not present

## 2020-06-20 DIAGNOSIS — R531 Weakness: Secondary | ICD-10-CM | POA: Diagnosis not present

## 2020-06-20 DIAGNOSIS — Z992 Dependence on renal dialysis: Secondary | ICD-10-CM | POA: Diagnosis not present

## 2020-06-20 DIAGNOSIS — Z6841 Body Mass Index (BMI) 40.0 and over, adult: Secondary | ICD-10-CM | POA: Diagnosis not present

## 2020-06-20 DIAGNOSIS — M1A9XX0 Chronic gout, unspecified, without tophus (tophi): Secondary | ICD-10-CM | POA: Diagnosis not present

## 2020-06-20 DIAGNOSIS — Z86711 Personal history of pulmonary embolism: Secondary | ICD-10-CM | POA: Diagnosis not present

## 2020-06-20 DIAGNOSIS — Z20822 Contact with and (suspected) exposure to covid-19: Secondary | ICD-10-CM | POA: Diagnosis not present

## 2020-06-20 DIAGNOSIS — R918 Other nonspecific abnormal finding of lung field: Secondary | ICD-10-CM | POA: Diagnosis not present

## 2020-06-20 DIAGNOSIS — R55 Syncope and collapse: Secondary | ICD-10-CM | POA: Diagnosis not present

## 2020-06-20 DIAGNOSIS — D638 Anemia in other chronic diseases classified elsewhere: Secondary | ICD-10-CM | POA: Diagnosis not present

## 2020-06-20 DIAGNOSIS — Z79899 Other long term (current) drug therapy: Secondary | ICD-10-CM | POA: Diagnosis not present

## 2020-06-20 DIAGNOSIS — R29818 Other symptoms and signs involving the nervous system: Secondary | ICD-10-CM | POA: Diagnosis not present

## 2020-06-20 DIAGNOSIS — E875 Hyperkalemia: Secondary | ICD-10-CM | POA: Diagnosis not present

## 2020-06-20 DIAGNOSIS — I120 Hypertensive chronic kidney disease with stage 5 chronic kidney disease or end stage renal disease: Secondary | ICD-10-CM | POA: Diagnosis not present

## 2020-06-20 DIAGNOSIS — N186 End stage renal disease: Secondary | ICD-10-CM | POA: Diagnosis not present

## 2020-06-20 DIAGNOSIS — D631 Anemia in chronic kidney disease: Secondary | ICD-10-CM | POA: Diagnosis not present

## 2020-06-20 DIAGNOSIS — H81399 Other peripheral vertigo, unspecified ear: Secondary | ICD-10-CM | POA: Diagnosis not present

## 2020-06-21 DIAGNOSIS — M1A9XX0 Chronic gout, unspecified, without tophus (tophi): Secondary | ICD-10-CM | POA: Diagnosis not present

## 2020-06-21 DIAGNOSIS — G2581 Restless legs syndrome: Secondary | ICD-10-CM | POA: Diagnosis not present

## 2020-06-21 DIAGNOSIS — E875 Hyperkalemia: Secondary | ICD-10-CM | POA: Diagnosis not present

## 2020-06-21 DIAGNOSIS — I10 Essential (primary) hypertension: Secondary | ICD-10-CM | POA: Diagnosis not present

## 2020-06-21 DIAGNOSIS — Z86711 Personal history of pulmonary embolism: Secondary | ICD-10-CM | POA: Diagnosis not present

## 2020-06-21 DIAGNOSIS — H81399 Other peripheral vertigo, unspecified ear: Secondary | ICD-10-CM | POA: Diagnosis not present

## 2020-06-21 DIAGNOSIS — D631 Anemia in chronic kidney disease: Secondary | ICD-10-CM | POA: Diagnosis not present

## 2020-06-21 DIAGNOSIS — N186 End stage renal disease: Secondary | ICD-10-CM | POA: Diagnosis not present

## 2020-06-21 DIAGNOSIS — Z6841 Body Mass Index (BMI) 40.0 and over, adult: Secondary | ICD-10-CM | POA: Diagnosis not present

## 2020-06-21 DIAGNOSIS — Z992 Dependence on renal dialysis: Secondary | ICD-10-CM | POA: Diagnosis not present

## 2020-06-21 DIAGNOSIS — K219 Gastro-esophageal reflux disease without esophagitis: Secondary | ICD-10-CM | POA: Diagnosis not present

## 2020-06-24 DIAGNOSIS — N2581 Secondary hyperparathyroidism of renal origin: Secondary | ICD-10-CM | POA: Diagnosis not present

## 2020-06-24 DIAGNOSIS — N186 End stage renal disease: Secondary | ICD-10-CM | POA: Diagnosis not present

## 2020-06-24 DIAGNOSIS — R110 Nausea: Secondary | ICD-10-CM | POA: Diagnosis not present

## 2020-06-24 DIAGNOSIS — D509 Iron deficiency anemia, unspecified: Secondary | ICD-10-CM | POA: Diagnosis not present

## 2020-06-24 DIAGNOSIS — Z992 Dependence on renal dialysis: Secondary | ICD-10-CM | POA: Diagnosis not present

## 2020-06-24 DIAGNOSIS — D631 Anemia in chronic kidney disease: Secondary | ICD-10-CM | POA: Diagnosis not present

## 2020-06-26 DIAGNOSIS — R6889 Other general symptoms and signs: Secondary | ICD-10-CM | POA: Diagnosis not present

## 2020-06-26 DIAGNOSIS — J029 Acute pharyngitis, unspecified: Secondary | ICD-10-CM | POA: Diagnosis not present

## 2020-06-26 DIAGNOSIS — R059 Cough, unspecified: Secondary | ICD-10-CM | POA: Diagnosis not present

## 2020-06-26 DIAGNOSIS — Z20822 Contact with and (suspected) exposure to covid-19: Secondary | ICD-10-CM | POA: Diagnosis not present

## 2020-06-26 DIAGNOSIS — R0989 Other specified symptoms and signs involving the circulatory and respiratory systems: Secondary | ICD-10-CM | POA: Diagnosis not present

## 2020-06-26 DIAGNOSIS — Z1152 Encounter for screening for COVID-19: Secondary | ICD-10-CM | POA: Diagnosis not present

## 2020-06-26 DIAGNOSIS — J020 Streptococcal pharyngitis: Secondary | ICD-10-CM | POA: Diagnosis not present

## 2020-06-27 DIAGNOSIS — N2581 Secondary hyperparathyroidism of renal origin: Secondary | ICD-10-CM | POA: Diagnosis not present

## 2020-06-27 DIAGNOSIS — D509 Iron deficiency anemia, unspecified: Secondary | ICD-10-CM | POA: Diagnosis not present

## 2020-06-27 DIAGNOSIS — Z992 Dependence on renal dialysis: Secondary | ICD-10-CM | POA: Diagnosis not present

## 2020-06-27 DIAGNOSIS — R110 Nausea: Secondary | ICD-10-CM | POA: Diagnosis not present

## 2020-06-27 DIAGNOSIS — N186 End stage renal disease: Secondary | ICD-10-CM | POA: Diagnosis not present

## 2020-06-27 DIAGNOSIS — D631 Anemia in chronic kidney disease: Secondary | ICD-10-CM | POA: Diagnosis not present

## 2020-06-30 DIAGNOSIS — Z992 Dependence on renal dialysis: Secondary | ICD-10-CM | POA: Diagnosis not present

## 2020-06-30 DIAGNOSIS — D631 Anemia in chronic kidney disease: Secondary | ICD-10-CM | POA: Diagnosis not present

## 2020-06-30 DIAGNOSIS — D509 Iron deficiency anemia, unspecified: Secondary | ICD-10-CM | POA: Diagnosis not present

## 2020-06-30 DIAGNOSIS — N2581 Secondary hyperparathyroidism of renal origin: Secondary | ICD-10-CM | POA: Diagnosis not present

## 2020-06-30 DIAGNOSIS — N186 End stage renal disease: Secondary | ICD-10-CM | POA: Diagnosis not present

## 2020-07-02 DIAGNOSIS — R079 Chest pain, unspecified: Secondary | ICD-10-CM | POA: Diagnosis not present

## 2020-07-02 DIAGNOSIS — R0789 Other chest pain: Secondary | ICD-10-CM | POA: Diagnosis not present

## 2020-07-02 DIAGNOSIS — R000 Tachycardia, unspecified: Secondary | ICD-10-CM | POA: Diagnosis not present

## 2020-07-02 DIAGNOSIS — R070 Pain in throat: Secondary | ICD-10-CM | POA: Diagnosis not present

## 2020-07-02 DIAGNOSIS — I10 Essential (primary) hypertension: Secondary | ICD-10-CM | POA: Diagnosis not present

## 2020-07-03 ENCOUNTER — Emergency Department (HOSPITAL_COMMUNITY)
Admission: EM | Admit: 2020-07-03 | Discharge: 2020-07-03 | Disposition: A | Discharge status: Home / Self Care | Payer: Medicare Other | Attending: Emergency Medicine | Admitting: Emergency Medicine

## 2020-07-03 ENCOUNTER — Encounter (HOSPITAL_COMMUNITY): Payer: Self-pay

## 2020-07-03 ENCOUNTER — Emergency Department (HOSPITAL_COMMUNITY): Payer: Medicare Other

## 2020-07-03 ENCOUNTER — Other Ambulatory Visit: Payer: Self-pay

## 2020-07-03 DIAGNOSIS — N186 End stage renal disease: Secondary | ICD-10-CM | POA: Diagnosis not present

## 2020-07-03 DIAGNOSIS — R55 Syncope and collapse: Secondary | ICD-10-CM | POA: Diagnosis not present

## 2020-07-03 DIAGNOSIS — Z79899 Other long term (current) drug therapy: Secondary | ICD-10-CM | POA: Diagnosis not present

## 2020-07-03 DIAGNOSIS — M79603 Pain in arm, unspecified: Secondary | ICD-10-CM | POA: Diagnosis not present

## 2020-07-03 DIAGNOSIS — Z20822 Contact with and (suspected) exposure to covid-19: Secondary | ICD-10-CM | POA: Insufficient documentation

## 2020-07-03 DIAGNOSIS — J209 Acute bronchitis, unspecified: Secondary | ICD-10-CM | POA: Diagnosis not present

## 2020-07-03 DIAGNOSIS — R52 Pain, unspecified: Secondary | ICD-10-CM | POA: Diagnosis not present

## 2020-07-03 DIAGNOSIS — Z992 Dependence on renal dialysis: Secondary | ICD-10-CM | POA: Diagnosis not present

## 2020-07-03 DIAGNOSIS — R0603 Acute respiratory distress: Secondary | ICD-10-CM | POA: Diagnosis not present

## 2020-07-03 DIAGNOSIS — H6692 Otitis media, unspecified, left ear: Secondary | ICD-10-CM | POA: Insufficient documentation

## 2020-07-03 DIAGNOSIS — R42 Dizziness and giddiness: Secondary | ICD-10-CM | POA: Diagnosis not present

## 2020-07-03 DIAGNOSIS — R0602 Shortness of breath: Secondary | ICD-10-CM | POA: Diagnosis not present

## 2020-07-03 DIAGNOSIS — Z7982 Long term (current) use of aspirin: Secondary | ICD-10-CM | POA: Diagnosis not present

## 2020-07-03 DIAGNOSIS — E875 Hyperkalemia: Secondary | ICD-10-CM | POA: Insufficient documentation

## 2020-07-03 DIAGNOSIS — R069 Unspecified abnormalities of breathing: Secondary | ICD-10-CM | POA: Diagnosis not present

## 2020-07-03 DIAGNOSIS — Z1152 Encounter for screening for COVID-19: Secondary | ICD-10-CM | POA: Diagnosis not present

## 2020-07-03 DIAGNOSIS — R059 Cough, unspecified: Secondary | ICD-10-CM | POA: Diagnosis not present

## 2020-07-03 LAB — COMPREHENSIVE METABOLIC PANEL
ALT: 26 U/L (ref 0–44)
AST: 19 U/L (ref 15–41)
Albumin: 3 g/dL — ABNORMAL LOW (ref 3.5–5.0)
Alkaline Phosphatase: 124 U/L (ref 38–126)
Anion gap: 21 — ABNORMAL HIGH (ref 5–15)
BUN: 79 mg/dL — ABNORMAL HIGH (ref 6–20)
CO2: 19 mmol/L — ABNORMAL LOW (ref 22–32)
Calcium: 8.2 mg/dL — ABNORMAL LOW (ref 8.9–10.3)
Chloride: 95 mmol/L — ABNORMAL LOW (ref 98–111)
Creatinine, Ser: 12.3 mg/dL — ABNORMAL HIGH (ref 0.44–1.00)
GFR, Estimated: 3 mL/min — ABNORMAL LOW (ref >60)
Glucose, Bld: 93 mg/dL (ref 70–99)
Potassium: 6.1 mmol/L — ABNORMAL HIGH (ref 3.5–5.1)
Sodium: 135 mmol/L (ref 135–145)
Total Bilirubin: 0.8 mg/dL (ref 0.3–1.2)
Total Protein: 7.4 g/dL (ref 6.5–8.1)

## 2020-07-03 LAB — CBC WITH DIFFERENTIAL/PLATELET
Abs Immature Granulocytes: 0.07 10*3/uL (ref 0.00–0.07)
Basophils Absolute: 0.1 10*3/uL (ref 0.0–0.1)
Basophils Relative: 0 %
Eosinophils Absolute: 0.2 10*3/uL (ref 0.0–0.5)
Eosinophils Relative: 1 %
HCT: 30 % — ABNORMAL LOW (ref 36.0–46.0)
Hemoglobin: 9 g/dL — ABNORMAL LOW (ref 12.0–15.0)
Immature Granulocytes: 1 %
Lymphocytes Relative: 10 %
Lymphs Abs: 1.5 10*3/uL (ref 0.7–4.0)
MCH: 29.4 pg (ref 26.0–34.0)
MCHC: 30 g/dL (ref 30.0–36.0)
MCV: 98 fL (ref 80.0–100.0)
Monocytes Absolute: 1.3 10*3/uL — ABNORMAL HIGH (ref 0.1–1.0)
Monocytes Relative: 8 %
Neutro Abs: 12.5 10*3/uL — ABNORMAL HIGH (ref 1.7–7.7)
Neutrophils Relative %: 80 %
Platelets: 271 10*3/uL (ref 150–400)
RBC: 3.06 MIL/uL — ABNORMAL LOW (ref 3.87–5.11)
RDW: 17.5 % — ABNORMAL HIGH (ref 11.5–15.5)
WBC: 15.6 10*3/uL — ABNORMAL HIGH (ref 4.0–10.5)
nRBC: 0 % (ref 0.0–0.2)

## 2020-07-03 LAB — I-STAT VENOUS BLOOD GAS, ED
Acid-base deficit: 3 mmol/L — ABNORMAL HIGH (ref 0.0–2.0)
Bicarbonate: 19.2 mmol/L — ABNORMAL LOW (ref 20.0–28.0)
Calcium, Ion: 0.82 mmol/L — CL (ref 1.15–1.40)
HCT: 29 % — ABNORMAL LOW (ref 36.0–46.0)
Hemoglobin: 9.9 g/dL — ABNORMAL LOW (ref 12.0–15.0)
O2 Saturation: 97 %
Potassium: 6 mmol/L — ABNORMAL HIGH (ref 3.5–5.1)
Sodium: 133 mmol/L — ABNORMAL LOW (ref 135–145)
TCO2: 20 mmol/L — ABNORMAL LOW (ref 22–32)
pCO2, Ven: 25.7 mmHg — ABNORMAL LOW (ref 44.0–60.0)
pH, Ven: 7.482 — ABNORMAL HIGH (ref 7.250–7.430)
pO2, Ven: 78 mmHg — ABNORMAL HIGH (ref 32.0–45.0)

## 2020-07-03 LAB — RESP PANEL BY RT-PCR (FLU A&B, COVID) ARPGX2
Influenza A by PCR: NEGATIVE
Influenza B by PCR: NEGATIVE
SARS Coronavirus 2 by RT PCR: NEGATIVE

## 2020-07-03 MED ORDER — PREDNISONE 20 MG PO TABS: 20.0000 mg | ORAL_TABLET | Freq: Two times a day (BID) | ORAL | 0 refills | Status: AC

## 2020-07-03 MED ORDER — CLARITHROMYCIN 500 MG PO TABS
500.0000 mg | ORAL_TABLET | Freq: Once | ORAL | Status: AC
  Administered 2020-07-03: 500 mg via ORAL
  Filled 2020-07-03 (×2): qty 1

## 2020-07-03 MED ORDER — CLARITHROMYCIN 500 MG PO TABS: 500.0000 mg | ORAL_TABLET | Freq: Every day | ORAL | 0 refills | Status: AC

## 2020-07-03 MED ORDER — ALBUTEROL SULFATE HFA 108 (90 BASE) MCG/ACT IN AERS
4.0000 | INHALATION_SPRAY | Freq: Once | RESPIRATORY_TRACT | Status: AC
  Administered 2020-07-03: 4 via RESPIRATORY_TRACT
  Filled 2020-07-03: qty 6.7

## 2020-07-03 MED ORDER — PREDNISONE 20 MG PO TABS
60.0000 mg | ORAL_TABLET | Freq: Once | ORAL | Status: AC
  Administered 2020-07-03: 60 mg via ORAL
  Filled 2020-07-03: qty 3

## 2020-07-03 MED ORDER — SODIUM ZIRCONIUM CYCLOSILICATE 5 G PO PACK
5.0000 g | PACK | Freq: Once | ORAL | Status: AC
  Administered 2020-07-03: 5 g via ORAL
  Filled 2020-07-03 (×2): qty 1

## 2020-07-03 NOTE — ED Provider Notes (Signed)
[facility] EMERGENCY DEPARTMENT Provider Note   CSN: [id number] Arrival date & time: 07/03/20  1049     History Chief Complaint  Patient presents with  . Cough  . Shortness of Breath    Christine Horn is a 55 y.o. female.  HPI She is here for evaluation of trouble breathing and cough, occasionally productive. She is currently taking antibiotics (amoxicillin) for bronchitis. She was hospitalized, 2 weeks ago for dizziness had a comprehensive evaluation. At that time she was having left ear pain. Left TM was not visible, according to documentation in the EMR. Today she was getting ready to see her physician, for a checkup when she suddenly passed out. After that she noticed some pain in her left upper arm where she has a dialysis shunt, and is worried that it was damaged when they "picked me up." She denies fever, chills, focal weakness or paresthesia at this time. She does not use medicines at home for breathing including oxygen, or inhalers. She has had her Covid vaccines. There are no other known modifying factors.    Past Medical History:  Diagnosis Date  . Anemia    On Epogen from Nephrologist  . Cellulitis of leg   . Chronic gout   . Chronic kidney disease    Stage 5  . Lower leg edema    Left  . Pulmonary emboli [facility])     Patient Active Problem List   Diagnosis Date Noted  . ESRD on dialysis ([facility]) 05/11/2020  . Lower extremity edema 05/11/2020  . Cellulitis 05/10/2020    History reviewed. No pertinent surgical history.   OB History   No obstetric history on file.     Family History  Problem Relation Age of Onset  . Hypertension Mother   . Hyperlipidemia Mother   . Diabetes Mother   . Hypertension Father   . Hyperlipidemia Father   . Diabetes Father   . Hypertension Sister   . Diabetes Sister   . Mental illness Sister     Social History   Tobacco Use  . Smoking status: Never Smoker  . Smokeless tobacco: Never Used  Substance Use  Topics  . Alcohol use: Yes    Comment: Occasional    Home Medications Prior to Admission medications   Medication Sig Start Date End Date Taking? Authorizing Provider  acetaminophen (TYLENOL) 325 MG tablet Take 650 mg by mouth every 6 (six) hours as needed for mild pain or headache.   Yes [provider]  amoxicillin (AMOXIL) 500 MG capsule Take 1,000 mg by mouth 2 (two) times daily. 06/26/20  Yes [provider]  aspirin 81 MG EC tablet Take 81 mg by mouth daily. 06/21/20 06/21/21 Yes [provider]  Calcium Acetate, Phos Binder, (CALCIUM ACETATE PO) Take 1,334 mg by mouth 3 (three) times daily.    Yes [provider]  carvedilol (COREG) 12.5 MG tablet Take 12.5 mg by mouth 2 (two) times daily. 05/09/20  Yes [provider]  clarithromycin (BIAXIN) 500 MG tablet Take 1 tablet (500 mg total) by mouth daily. 07/03/20  Yes Daleen Bo, MD  fluticasone (FLONASE) 50 MCG/ACT nasal spray Place 1 spray into both nostrils as needed for allergies.   Yes [provider]  gabapentin (NEURONTIN) 300 MG capsule Take 300 mg by mouth at bedtime as needed (pain).   Yes [provider]  lidocaine-prilocaine (EMLA) cream Apply 1 application topically Every Tuesday,Thursday,and Saturday with dialysis.  04/18/20  Yes [provider]  meclizine (ANTIVERT) 12.5 MG tablet Take 12.5 mg by mouth 3 (three) times daily. 06/22/20  Yes [provider]  Melatonin 10 MG TABS Take 10 mg by mouth at bedtime as needed (for sleep).    Yes [provider]  omeprazole (PRILOSEC) 20 MG capsule Take 20 mg by mouth daily.   Yes [provider]  ondansetron (ZOFRAN-ODT) 4 MG disintegrating tablet Take 4 mg by mouth every 6 (six) hours as needed for nausea or vomiting.   Yes [provider]  PARoxetine (PAXIL) 10 MG tablet Take 10 mg by mouth daily. 04/18/20  Yes [provider]  predniSONE (DELTASONE) 20 MG tablet  Take 1 tablet (20 mg total) by mouth 2 (two) times daily. 07/03/20  Yes Daleen Bo, MD  promethazine (PHENERGAN) 25 MG tablet Take 25 mg by mouth every 4 (four) hours as needed for nausea or vomiting.  03/19/20  Yes [provider]  torsemide (DEMADEX) 100 MG tablet Take 100 mg by mouth daily.   Yes [provider]  diclofenac Sodium (VOLTAREN) 1 % GEL Apply 2 g topically 4 (four) times daily. Patient not taking: No sig reported 05/13/20   Sanjuan Dame, MD    Allergies    Cozaar [losartan potassium] and Vancomycin  Review of Systems   Review of Systems  All other systems reviewed and are negative.   Physical Exam Updated Vital Signs BP (!) 169/84   Pulse (!) 101   Temp 98.8 F (37.1 C) (Oral)   Resp (!) 26   Ht 5\' 4"  (1.626 m)   Wt 133.8 kg   SpO2 96%   BMI 50.64 kg/m   Physical Exam Vitals and nursing note reviewed.  Constitutional:      General: She is not in acute distress.    Appearance: She is well-developed and well-nourished. She is obese. She is ill-appearing. She is not toxic-appearing or diaphoretic.  HENT:     Head: Normocephalic and atraumatic.     Right Ear: Tympanic membrane, ear canal and external ear normal.     Left Ear: External ear normal.     Ears:     Comments: Left tympanic membrane is distorted, retracted, opaque, with erythema. No drainage in the left auditory canal.    Nose: No congestion.     Mouth/Throat:     Pharynx: No oropharyngeal exudate or posterior oropharyngeal erythema.  Eyes:     Extraocular Movements: EOM normal.     Conjunctiva/sclera: Conjunctivae normal.     Pupils: Pupils are equal, round, and reactive to light.  Neck:     Trachea: Phonation normal.  Cardiovascular:     Rate and Rhythm: Normal rate and regular rhythm.     Heart sounds: Normal heart sounds.  Pulmonary:     Effort: Pulmonary effort is normal.     Breath sounds: Normal breath sounds.  Chest:     Chest wall: No bony tenderness.   Abdominal:     General: There is no distension.     Palpations: Abdomen is soft.     Tenderness: There is no abdominal tenderness.  Musculoskeletal:        General: Normal range of motion.     Cervical back: Normal range of motion and neck supple.  Skin:    General: Skin is warm, dry and intact.  Neurological:     Mental Status: She is alert and oriented to person, place, and time.     Cranial Nerves: No cranial nerve  deficit.     Sensory: No sensory deficit.     Motor: No abnormal muscle tone.     Coordination: Coordination normal.  Psychiatric:        Mood and Affect: Mood and affect and mood normal.        Behavior: Behavior normal.        Thought Content: Thought content normal.        Judgment: Judgment normal.     ED Results / Procedures / Treatments   Labs (all labs ordered are listed, but only abnormal results are displayed) Labs Reviewed  COMPREHENSIVE METABOLIC PANEL - Abnormal; Notable for the following components:      Result Value   Potassium 6.1 (*)    Chloride 95 (*)    CO2 19 (*)    BUN 79 (*)    Creatinine, Ser 12.30 (*)    Calcium 8.2 (*)    Albumin 3.0 (*)    GFR, Estimated 3 (*)    Anion gap 21 (*)    All other components within normal limits  CBC WITH DIFFERENTIAL/PLATELET - Abnormal; Notable for the following components:   WBC 15.6 (*)    RBC 3.06 (*)    Hemoglobin 9.0 (*)    HCT 30.0 (*)    RDW 17.5 (*)    Neutro Abs 12.5 (*)    Monocytes Absolute 1.3 (*)    All other components within normal limits  I-STAT VENOUS BLOOD GAS, ED - Abnormal; Notable for the following components:   pH, Ven 7.482 (*)    pCO2, Ven 25.7 (*)    pO2, Ven 78.0 (*)    Bicarbonate 19.2 (*)    TCO2 20 (*)    Acid-base deficit 3.0 (*)    Sodium 133 (*)    Potassium 6.0 (*)    Calcium, Ion 0.82 (*)    HCT 29.0 (*)    Hemoglobin 9.9 (*)    All other components within normal limits  RESP PANEL BY RT-PCR (FLU A&B, COVID) ARPGX2  BLOOD GAS, VENOUS    EKG EKG  Interpretation  Date/Time:  Tuesday July 03 2020 10:51:09 EST Ventricular Rate:  97 PR Interval:    QRS Duration: 88 QT Interval:  349 QTC Calculation: 444 R Axis:   3 Text Interpretation: Sinus rhythm Since last tracing rate slower Otherwise no significant change Confirmed by Daleen Bo [phone]) on 07/03/2020 12:05:32 PM   Radiology DG Chest Port 1 View  Result Date: 07/03/2020 CLINICAL DATA:  Shortness of breath.  Left arm pain.  Strep throat. EXAM: PORTABLE CHEST 1 VIEW COMPARISON:  05/10/2020 FINDINGS: Midline trachea. Mild cardiomegaly. No pleural effusion or pneumothorax. Mild diffuse interstitial thickening. Peripheral interstitial opacity within the right mid lung, new. Lingular subsegmental atelectasis or developing scar. Equivocal right lung base interstitial opacity. IMPRESSION: Right-sided interstitial opacities, suspicious for pneumonia. Correlate with COVID-19 status, as COVID-19 pneumonia could have this appearance. Cardiomegaly with mild pulmonary interstitial thickening, but no overt congestive failure. Electronically Signed   By: Abigail Miyamoto M.D.   On: 07/03/2020 11:53    Procedures Procedures (including critical care time)  Medications Ordered in ED Medications  sodium zirconium cyclosilicate (LOKELMA) packet 5 g (has no administration in time range)  predniSONE (DELTASONE) tablet 60 mg (has no administration in time range)  clarithromycin (BIAXIN) tablet 500 mg (has no administration in time range)  albuterol (VENTOLIN HFA) 108 (90 Base) MCG/ACT inhaler 4 puff (4 puffs Inhalation Given 07/03/20 1226)    ED Course  I have reviewed the triage vital signs and the nursing notes.  Pertinent labs & imaging results that were available during my care of the patient were reviewed by me and considered in my medical decision making (see chart for details).    MDM Rules/Calculators/A&P                           Patient Vitals for the past 24 hrs:  BP Temp Temp src  Pulse Resp SpO2 Height Weight  07/03/20 1515 (!) 169/84 - - (!) 101 (!) 26 96 % - -  07/03/20 1430 (!) 156/86 - - (!) 104 (!) 21 97 % - -  07/03/20 1315 135/90 - - (!) 102 19 98 % - -  07/03/20 1215 (!) 153/73 - - 97 (!) 21 95 % - -  07/03/20 1130 113/66 - - 98 (!) 22 100 % - -  07/03/20 1100 124/82 - - 96 (!) 21 95 % 5\' 4"  (1.626 m) 133.8 kg  07/03/20 1059 (!) 125/108 98.8 F (37.1 C) Oral 97 (!) 22 96 % - -  07/03/20 1055 - - - - - 99 % - -    3:30 PM Reevaluation with update and discussion. After initial assessment and treatment, an updated evaluation reveals she states she is more comfortable after treatment which has been initiated.  Findings discussed with the patient.  The patient has arranged to get dialysis tomorrow at her facility in [place], [place].  All questions answered. Daleen Bo   Medical Decision Making:  This patient is presenting for evaluation of syncope, shortness of breath, cough and left ear pain, which does require a range of treatment options, and is a complaint that involves a moderate risk of morbidity and mortality. The differential diagnoses include pneumonia, pulmonary edema, complications from end-stage renal disease, ear infection. I decided to review old records, and in summary obese female was at her doctor's office today when she had a syncopal episode.  She did not dialyze, yet today.  I did not require additional historical information from anyone.  Clinical Laboratory Tests Ordered, included Covid test CBC, Metabolic panel and Venous blood gas. Review indicates venous blood gas, is reassuring other than mild elevation of potassium, also present on chemistry panel.  BUN and creatinine are high consistent with end-stage renal disease.  CBC indicates elevated white count, and low hemoglobin.. Radiologic Tests Ordered, included chest x-ray.  I independently Visualized: Radiograph images, which show no infiltrate or edema  Cardiac Monitor Tracing  which shows sinus tachycardia, less than 110   Critical Interventions-clinical evaluation, laboratory testing, chest x-ray, cardiac monitor, observation and reassessment  After These Interventions, the Patient was reevaluated and was found stable for discharge.  Patient presenting on amoxicillin for bronchitis symptoms, not improving.  No evidence for pneumonia or acute respiratory arrest.  Ear pain likely secondary to otitis media, also not responsive to amoxicillin.  Patient screened for complications of end-stage renal disease, found with mild elevation of potassium which was treated with Lokelma.  She is not significantly fluid overloaded, requiring emergent dialysis.  She has made plans for dialysis tomorrow, through her facility.  Caoimhe Damron was evaluated in Emergency Department on 07/03/2020 for the symptoms described in the history of present illness. She was evaluated in the context of the global COVID-19 pandemic, which necessitated consideration that the patient might be at risk for infection with the SARS-CoV-2 virus that causes COVID-19. Institutional protocols and algorithms  that pertain to the evaluation of patients at risk for COVID-19 are in a state of rapid change based on information released by regulatory bodies including the CDC and federal and state organizations. These policies and algorithms were followed during the patient's care in the ED.  CRITICAL CARE-yes Performed by: Daleen Bo  Nursing Notes Reviewed/ Care Coordinated Applicable Imaging Reviewed Interpretation of Laboratory Data incorporated into ED treatment  The patient appears reasonably screened and/or stabilized for discharge and I doubt any other medical condition or other [facility] requiring further screening, evaluation, or treatment in the ED at this time prior to discharge.  Plan: Home Medications-discharged with albuterol inhaler to use for bronchitis continue usual, except amoxicillin; Home Treatments-rest,  fluids; return here if the recommended treatment, does not improve the symptoms; Recommended follow up-PCP and renal as scheduled and as needed     Final Clinical Impression(s) / ED Diagnoses Final diagnoses:  Acute bronchitis, unspecified organism  Left otitis media, unspecified otitis media type  Hyperkalemia    Rx / DC Orders ED Discharge Orders         Ordered    clarithromycin (BIAXIN) 500 MG tablet  Daily        07/03/20 1610    predniSONE (DELTASONE) 20 MG tablet  2 times daily        07/03/20 1610           Daleen Bo, MD 07/03/20 1617

## 2020-07-03 NOTE — ED Triage Notes (Signed)
Pt BIB [facility] from doctor office. Pt was diagnosed with strep throat a week ago, pt still has 2 days of antibiotics left but has not felt better. Pt is c/o SHOB, cough, N/V/D. Pt had a rapid COVID test at doctors office and it was negative. Pt fell to the ground at the doctors office as while and is c/o 8/120 pain to the left arm where her dialysis shunt is. Pt is worried they messed it up when they grabbed her off the floor. Pt has dialysis T,Thur, S. Pt has not missed a day.

## 2020-07-03 NOTE — Discharge Instructions (Addendum)
It appears that you have an infection in your lungs called bronchitis and likely have a left ear infection.  We are giving you antibiotics to treat both.  Please follow-up with your primary care doctor, if you're not improving the next few days.  You need to be dialyzed tomorrow.

## 2020-07-04 DIAGNOSIS — D509 Iron deficiency anemia, unspecified: Secondary | ICD-10-CM | POA: Diagnosis not present

## 2020-07-04 DIAGNOSIS — D631 Anemia in chronic kidney disease: Secondary | ICD-10-CM | POA: Diagnosis not present

## 2020-07-04 DIAGNOSIS — Z992 Dependence on renal dialysis: Secondary | ICD-10-CM | POA: Diagnosis not present

## 2020-07-04 DIAGNOSIS — N186 End stage renal disease: Secondary | ICD-10-CM | POA: Diagnosis not present

## 2020-07-04 DIAGNOSIS — N2581 Secondary hyperparathyroidism of renal origin: Secondary | ICD-10-CM | POA: Diagnosis not present

## 2020-07-05 DIAGNOSIS — D509 Iron deficiency anemia, unspecified: Secondary | ICD-10-CM | POA: Diagnosis not present

## 2020-07-05 DIAGNOSIS — D631 Anemia in chronic kidney disease: Secondary | ICD-10-CM | POA: Diagnosis not present

## 2020-07-05 DIAGNOSIS — N2581 Secondary hyperparathyroidism of renal origin: Secondary | ICD-10-CM | POA: Diagnosis not present

## 2020-07-05 DIAGNOSIS — Z992 Dependence on renal dialysis: Secondary | ICD-10-CM | POA: Diagnosis not present

## 2020-07-05 DIAGNOSIS — N186 End stage renal disease: Secondary | ICD-10-CM | POA: Diagnosis not present

## 2020-07-07 DIAGNOSIS — D509 Iron deficiency anemia, unspecified: Secondary | ICD-10-CM | POA: Diagnosis not present

## 2020-07-07 DIAGNOSIS — N2581 Secondary hyperparathyroidism of renal origin: Secondary | ICD-10-CM | POA: Diagnosis not present

## 2020-07-07 DIAGNOSIS — Z992 Dependence on renal dialysis: Secondary | ICD-10-CM | POA: Diagnosis not present

## 2020-07-07 DIAGNOSIS — N186 End stage renal disease: Secondary | ICD-10-CM | POA: Diagnosis not present

## 2020-07-07 DIAGNOSIS — D631 Anemia in chronic kidney disease: Secondary | ICD-10-CM | POA: Diagnosis not present

## 2020-07-10 DIAGNOSIS — Z79899 Other long term (current) drug therapy: Secondary | ICD-10-CM | POA: Diagnosis not present

## 2020-07-10 DIAGNOSIS — R918 Other nonspecific abnormal finding of lung field: Secondary | ICD-10-CM | POA: Diagnosis not present

## 2020-07-10 DIAGNOSIS — N186 End stage renal disease: Secondary | ICD-10-CM | POA: Diagnosis not present

## 2020-07-10 DIAGNOSIS — E8770 Fluid overload, unspecified: Secondary | ICD-10-CM | POA: Diagnosis not present

## 2020-07-10 DIAGNOSIS — Z9049 Acquired absence of other specified parts of digestive tract: Secondary | ICD-10-CM | POA: Diagnosis not present

## 2020-07-10 DIAGNOSIS — I10 Essential (primary) hypertension: Secondary | ICD-10-CM | POA: Diagnosis not present

## 2020-07-10 DIAGNOSIS — J181 Lobar pneumonia, unspecified organism: Secondary | ICD-10-CM | POA: Diagnosis not present

## 2020-07-10 DIAGNOSIS — Z992 Dependence on renal dialysis: Secondary | ICD-10-CM | POA: Diagnosis not present

## 2020-07-10 DIAGNOSIS — R7303 Prediabetes: Secondary | ICD-10-CM | POA: Insufficient documentation

## 2020-07-10 DIAGNOSIS — J9601 Acute respiratory failure with hypoxia: Secondary | ICD-10-CM | POA: Diagnosis not present

## 2020-07-10 DIAGNOSIS — K219 Gastro-esophageal reflux disease without esophagitis: Secondary | ICD-10-CM | POA: Diagnosis not present

## 2020-07-10 DIAGNOSIS — Z6841 Body Mass Index (BMI) 40.0 and over, adult: Secondary | ICD-10-CM | POA: Diagnosis not present

## 2020-07-10 DIAGNOSIS — I150 Renovascular hypertension: Secondary | ICD-10-CM | POA: Diagnosis not present

## 2020-07-10 DIAGNOSIS — J45909 Unspecified asthma, uncomplicated: Secondary | ICD-10-CM | POA: Diagnosis not present

## 2020-07-10 DIAGNOSIS — Z86711 Personal history of pulmonary embolism: Secondary | ICD-10-CM | POA: Diagnosis not present

## 2020-07-10 DIAGNOSIS — E875 Hyperkalemia: Secondary | ICD-10-CM | POA: Diagnosis not present

## 2020-07-10 DIAGNOSIS — I517 Cardiomegaly: Secondary | ICD-10-CM | POA: Diagnosis not present

## 2020-07-10 DIAGNOSIS — M1A9XX0 Chronic gout, unspecified, without tophus (tophi): Secondary | ICD-10-CM | POA: Diagnosis not present

## 2020-07-10 DIAGNOSIS — G2581 Restless legs syndrome: Secondary | ICD-10-CM | POA: Diagnosis present

## 2020-07-10 DIAGNOSIS — N261 Atrophy of kidney (terminal): Secondary | ICD-10-CM | POA: Diagnosis not present

## 2020-07-10 DIAGNOSIS — H6122 Impacted cerumen, left ear: Secondary | ICD-10-CM | POA: Diagnosis not present

## 2020-07-10 DIAGNOSIS — I120 Hypertensive chronic kidney disease with stage 5 chronic kidney disease or end stage renal disease: Secondary | ICD-10-CM | POA: Diagnosis not present

## 2020-07-10 DIAGNOSIS — Z20822 Contact with and (suspected) exposure to covid-19: Secondary | ICD-10-CM | POA: Diagnosis not present

## 2020-07-10 DIAGNOSIS — D631 Anemia in chronic kidney disease: Secondary | ICD-10-CM | POA: Diagnosis not present

## 2020-07-10 DIAGNOSIS — J189 Pneumonia, unspecified organism: Secondary | ICD-10-CM | POA: Diagnosis not present

## 2020-07-14 DIAGNOSIS — Z992 Dependence on renal dialysis: Secondary | ICD-10-CM | POA: Diagnosis not present

## 2020-07-14 DIAGNOSIS — D631 Anemia in chronic kidney disease: Secondary | ICD-10-CM | POA: Diagnosis not present

## 2020-07-14 DIAGNOSIS — D509 Iron deficiency anemia, unspecified: Secondary | ICD-10-CM | POA: Diagnosis not present

## 2020-07-14 DIAGNOSIS — N186 End stage renal disease: Secondary | ICD-10-CM | POA: Diagnosis not present

## 2020-07-14 DIAGNOSIS — N2581 Secondary hyperparathyroidism of renal origin: Secondary | ICD-10-CM | POA: Diagnosis not present

## 2020-07-17 DIAGNOSIS — Z992 Dependence on renal dialysis: Secondary | ICD-10-CM | POA: Diagnosis not present

## 2020-07-17 DIAGNOSIS — N2581 Secondary hyperparathyroidism of renal origin: Secondary | ICD-10-CM | POA: Diagnosis not present

## 2020-07-17 DIAGNOSIS — D631 Anemia in chronic kidney disease: Secondary | ICD-10-CM | POA: Diagnosis not present

## 2020-07-17 DIAGNOSIS — N186 End stage renal disease: Secondary | ICD-10-CM | POA: Diagnosis not present

## 2020-07-17 DIAGNOSIS — D509 Iron deficiency anemia, unspecified: Secondary | ICD-10-CM | POA: Diagnosis not present

## 2020-07-19 DIAGNOSIS — G4719 Other hypersomnia: Secondary | ICD-10-CM | POA: Diagnosis not present

## 2020-07-19 DIAGNOSIS — R059 Cough, unspecified: Secondary | ICD-10-CM | POA: Diagnosis not present

## 2020-07-19 DIAGNOSIS — Z7689 Persons encountering health services in other specified circumstances: Secondary | ICD-10-CM | POA: Diagnosis not present

## 2020-07-19 DIAGNOSIS — Z20822 Contact with and (suspected) exposure to covid-19: Secondary | ICD-10-CM | POA: Diagnosis not present

## 2020-07-19 DIAGNOSIS — H6502 Acute serous otitis media, left ear: Secondary | ICD-10-CM | POA: Diagnosis not present

## 2020-07-21 DIAGNOSIS — Z9115 Patient's noncompliance with renal dialysis: Secondary | ICD-10-CM | POA: Diagnosis not present

## 2020-07-21 DIAGNOSIS — R231 Pallor: Secondary | ICD-10-CM | POA: Diagnosis not present

## 2020-07-21 DIAGNOSIS — Z6841 Body Mass Index (BMI) 40.0 and over, adult: Secondary | ICD-10-CM | POA: Diagnosis not present

## 2020-07-21 DIAGNOSIS — J9691 Respiratory failure, unspecified with hypoxia: Secondary | ICD-10-CM | POA: Diagnosis not present

## 2020-07-21 DIAGNOSIS — I517 Cardiomegaly: Secondary | ICD-10-CM | POA: Diagnosis not present

## 2020-07-21 DIAGNOSIS — J159 Unspecified bacterial pneumonia: Secondary | ICD-10-CM | POA: Diagnosis not present

## 2020-07-21 DIAGNOSIS — R0602 Shortness of breath: Secondary | ICD-10-CM | POA: Diagnosis not present

## 2020-07-21 DIAGNOSIS — A419 Sepsis, unspecified organism: Secondary | ICD-10-CM | POA: Diagnosis not present

## 2020-07-21 DIAGNOSIS — Z20822 Contact with and (suspected) exposure to covid-19: Secondary | ICD-10-CM | POA: Diagnosis not present

## 2020-07-21 DIAGNOSIS — J811 Chronic pulmonary edema: Secondary | ICD-10-CM | POA: Insufficient documentation

## 2020-07-21 DIAGNOSIS — R110 Nausea: Secondary | ICD-10-CM | POA: Diagnosis not present

## 2020-07-21 DIAGNOSIS — I959 Hypotension, unspecified: Secondary | ICD-10-CM | POA: Diagnosis not present

## 2020-07-21 DIAGNOSIS — R6520 Severe sepsis without septic shock: Secondary | ICD-10-CM | POA: Diagnosis present

## 2020-07-21 DIAGNOSIS — E875 Hyperkalemia: Secondary | ICD-10-CM | POA: Diagnosis not present

## 2020-07-21 DIAGNOSIS — J9601 Acute respiratory failure with hypoxia: Secondary | ICD-10-CM | POA: Diagnosis not present

## 2020-07-21 DIAGNOSIS — J80 Acute respiratory distress syndrome: Secondary | ICD-10-CM | POA: Diagnosis not present

## 2020-07-21 DIAGNOSIS — N186 End stage renal disease: Secondary | ICD-10-CM | POA: Diagnosis not present

## 2020-07-21 DIAGNOSIS — I120 Hypertensive chronic kidney disease with stage 5 chronic kidney disease or end stage renal disease: Secondary | ICD-10-CM | POA: Diagnosis not present

## 2020-07-21 DIAGNOSIS — D631 Anemia in chronic kidney disease: Secondary | ICD-10-CM | POA: Diagnosis not present

## 2020-07-21 DIAGNOSIS — F32A Depression, unspecified: Secondary | ICD-10-CM | POA: Diagnosis present

## 2020-07-21 DIAGNOSIS — J189 Pneumonia, unspecified organism: Secondary | ICD-10-CM | POA: Diagnosis not present

## 2020-07-21 DIAGNOSIS — E871 Hypo-osmolality and hyponatremia: Secondary | ICD-10-CM | POA: Diagnosis not present

## 2020-07-21 DIAGNOSIS — N185 Chronic kidney disease, stage 5: Secondary | ICD-10-CM | POA: Diagnosis not present

## 2020-07-21 DIAGNOSIS — I440 Atrioventricular block, first degree: Secondary | ICD-10-CM | POA: Diagnosis not present

## 2020-07-21 DIAGNOSIS — J9602 Acute respiratory failure with hypercapnia: Secondary | ICD-10-CM | POA: Diagnosis not present

## 2020-07-21 DIAGNOSIS — K219 Gastro-esophageal reflux disease without esophagitis: Secondary | ICD-10-CM | POA: Diagnosis present

## 2020-07-21 DIAGNOSIS — Z992 Dependence on renal dialysis: Secondary | ICD-10-CM | POA: Diagnosis not present

## 2020-07-21 DIAGNOSIS — J9610 Chronic respiratory failure, unspecified whether with hypoxia or hypercapnia: Secondary | ICD-10-CM | POA: Diagnosis not present

## 2020-07-21 DIAGNOSIS — E8770 Fluid overload, unspecified: Secondary | ICD-10-CM | POA: Diagnosis not present

## 2020-07-21 DIAGNOSIS — G629 Polyneuropathy, unspecified: Secondary | ICD-10-CM | POA: Diagnosis present

## 2020-07-21 DIAGNOSIS — R069 Unspecified abnormalities of breathing: Secondary | ICD-10-CM | POA: Diagnosis not present

## 2020-07-21 DIAGNOSIS — R0603 Acute respiratory distress: Secondary | ICD-10-CM | POA: Diagnosis not present

## 2020-07-21 DIAGNOSIS — J9690 Respiratory failure, unspecified, unspecified whether with hypoxia or hypercapnia: Secondary | ICD-10-CM | POA: Insufficient documentation

## 2020-07-21 DIAGNOSIS — J810 Acute pulmonary edema: Secondary | ICD-10-CM | POA: Diagnosis not present

## 2020-07-23 DIAGNOSIS — Z992 Dependence on renal dialysis: Secondary | ICD-10-CM | POA: Diagnosis not present

## 2020-07-23 DIAGNOSIS — N186 End stage renal disease: Secondary | ICD-10-CM | POA: Diagnosis not present

## 2020-07-23 DIAGNOSIS — J189 Pneumonia, unspecified organism: Secondary | ICD-10-CM | POA: Diagnosis not present

## 2020-07-23 DIAGNOSIS — D631 Anemia in chronic kidney disease: Secondary | ICD-10-CM | POA: Diagnosis not present

## 2020-07-31 DIAGNOSIS — D631 Anemia in chronic kidney disease: Secondary | ICD-10-CM | POA: Diagnosis not present

## 2020-07-31 DIAGNOSIS — N186 End stage renal disease: Secondary | ICD-10-CM | POA: Diagnosis not present

## 2020-07-31 DIAGNOSIS — Z992 Dependence on renal dialysis: Secondary | ICD-10-CM | POA: Diagnosis not present

## 2020-07-31 DIAGNOSIS — R110 Nausea: Secondary | ICD-10-CM | POA: Diagnosis not present

## 2020-07-31 DIAGNOSIS — D509 Iron deficiency anemia, unspecified: Secondary | ICD-10-CM | POA: Diagnosis not present

## 2020-07-31 DIAGNOSIS — N2581 Secondary hyperparathyroidism of renal origin: Secondary | ICD-10-CM | POA: Diagnosis not present

## 2020-08-02 DIAGNOSIS — J9610 Chronic respiratory failure, unspecified whether with hypoxia or hypercapnia: Secondary | ICD-10-CM | POA: Diagnosis not present

## 2020-08-02 DIAGNOSIS — N186 End stage renal disease: Secondary | ICD-10-CM | POA: Diagnosis not present

## 2020-08-02 DIAGNOSIS — J189 Pneumonia, unspecified organism: Secondary | ICD-10-CM | POA: Diagnosis not present

## 2020-08-02 DIAGNOSIS — R000 Tachycardia, unspecified: Secondary | ICD-10-CM | POA: Diagnosis not present

## 2020-08-02 DIAGNOSIS — R110 Nausea: Secondary | ICD-10-CM | POA: Diagnosis not present

## 2020-08-02 DIAGNOSIS — D509 Iron deficiency anemia, unspecified: Secondary | ICD-10-CM | POA: Diagnosis not present

## 2020-08-02 DIAGNOSIS — Z992 Dependence on renal dialysis: Secondary | ICD-10-CM | POA: Diagnosis not present

## 2020-08-02 DIAGNOSIS — Z7689 Persons encountering health services in other specified circumstances: Secondary | ICD-10-CM | POA: Diagnosis not present

## 2020-08-02 DIAGNOSIS — N2581 Secondary hyperparathyroidism of renal origin: Secondary | ICD-10-CM | POA: Diagnosis not present

## 2020-08-02 DIAGNOSIS — D631 Anemia in chronic kidney disease: Secondary | ICD-10-CM | POA: Diagnosis not present

## 2020-08-04 DIAGNOSIS — R110 Nausea: Secondary | ICD-10-CM | POA: Diagnosis not present

## 2020-08-04 DIAGNOSIS — D631 Anemia in chronic kidney disease: Secondary | ICD-10-CM | POA: Diagnosis not present

## 2020-08-04 DIAGNOSIS — N2581 Secondary hyperparathyroidism of renal origin: Secondary | ICD-10-CM | POA: Diagnosis not present

## 2020-08-04 DIAGNOSIS — Z992 Dependence on renal dialysis: Secondary | ICD-10-CM | POA: Diagnosis not present

## 2020-08-04 DIAGNOSIS — N186 End stage renal disease: Secondary | ICD-10-CM | POA: Diagnosis not present

## 2020-08-04 DIAGNOSIS — D509 Iron deficiency anemia, unspecified: Secondary | ICD-10-CM | POA: Diagnosis not present

## 2020-08-07 DIAGNOSIS — Z992 Dependence on renal dialysis: Secondary | ICD-10-CM | POA: Diagnosis not present

## 2020-08-07 DIAGNOSIS — N2581 Secondary hyperparathyroidism of renal origin: Secondary | ICD-10-CM | POA: Diagnosis not present

## 2020-08-07 DIAGNOSIS — D509 Iron deficiency anemia, unspecified: Secondary | ICD-10-CM | POA: Diagnosis not present

## 2020-08-07 DIAGNOSIS — R110 Nausea: Secondary | ICD-10-CM | POA: Diagnosis not present

## 2020-08-07 DIAGNOSIS — D631 Anemia in chronic kidney disease: Secondary | ICD-10-CM | POA: Diagnosis not present

## 2020-08-07 DIAGNOSIS — N186 End stage renal disease: Secondary | ICD-10-CM | POA: Diagnosis not present

## 2020-08-09 DIAGNOSIS — N186 End stage renal disease: Secondary | ICD-10-CM | POA: Diagnosis not present

## 2020-08-09 DIAGNOSIS — D631 Anemia in chronic kidney disease: Secondary | ICD-10-CM | POA: Diagnosis not present

## 2020-08-09 DIAGNOSIS — R110 Nausea: Secondary | ICD-10-CM | POA: Diagnosis not present

## 2020-08-09 DIAGNOSIS — Z992 Dependence on renal dialysis: Secondary | ICD-10-CM | POA: Diagnosis not present

## 2020-08-09 DIAGNOSIS — N2581 Secondary hyperparathyroidism of renal origin: Secondary | ICD-10-CM | POA: Diagnosis not present

## 2020-08-09 DIAGNOSIS — D509 Iron deficiency anemia, unspecified: Secondary | ICD-10-CM | POA: Diagnosis not present

## 2020-08-11 DIAGNOSIS — N2581 Secondary hyperparathyroidism of renal origin: Secondary | ICD-10-CM | POA: Diagnosis not present

## 2020-08-11 DIAGNOSIS — D631 Anemia in chronic kidney disease: Secondary | ICD-10-CM | POA: Diagnosis not present

## 2020-08-11 DIAGNOSIS — R110 Nausea: Secondary | ICD-10-CM | POA: Diagnosis not present

## 2020-08-11 DIAGNOSIS — N186 End stage renal disease: Secondary | ICD-10-CM | POA: Diagnosis not present

## 2020-08-11 DIAGNOSIS — Z992 Dependence on renal dialysis: Secondary | ICD-10-CM | POA: Diagnosis not present

## 2020-08-11 DIAGNOSIS — D509 Iron deficiency anemia, unspecified: Secondary | ICD-10-CM | POA: Diagnosis not present

## 2020-08-16 DIAGNOSIS — R110 Nausea: Secondary | ICD-10-CM | POA: Diagnosis not present

## 2020-08-16 DIAGNOSIS — D509 Iron deficiency anemia, unspecified: Secondary | ICD-10-CM | POA: Diagnosis not present

## 2020-08-16 DIAGNOSIS — N186 End stage renal disease: Secondary | ICD-10-CM | POA: Diagnosis not present

## 2020-08-16 DIAGNOSIS — Z992 Dependence on renal dialysis: Secondary | ICD-10-CM | POA: Diagnosis not present

## 2020-08-16 DIAGNOSIS — D631 Anemia in chronic kidney disease: Secondary | ICD-10-CM | POA: Diagnosis not present

## 2020-08-16 DIAGNOSIS — N2581 Secondary hyperparathyroidism of renal origin: Secondary | ICD-10-CM | POA: Diagnosis not present

## 2020-08-18 DIAGNOSIS — Z992 Dependence on renal dialysis: Secondary | ICD-10-CM | POA: Diagnosis not present

## 2020-08-18 DIAGNOSIS — N2581 Secondary hyperparathyroidism of renal origin: Secondary | ICD-10-CM | POA: Diagnosis not present

## 2020-08-18 DIAGNOSIS — R110 Nausea: Secondary | ICD-10-CM | POA: Diagnosis not present

## 2020-08-18 DIAGNOSIS — D509 Iron deficiency anemia, unspecified: Secondary | ICD-10-CM | POA: Diagnosis not present

## 2020-08-18 DIAGNOSIS — D631 Anemia in chronic kidney disease: Secondary | ICD-10-CM | POA: Diagnosis not present

## 2020-08-18 DIAGNOSIS — N186 End stage renal disease: Secondary | ICD-10-CM | POA: Diagnosis not present

## 2020-08-21 DIAGNOSIS — N186 End stage renal disease: Secondary | ICD-10-CM | POA: Diagnosis not present

## 2020-08-21 DIAGNOSIS — D509 Iron deficiency anemia, unspecified: Secondary | ICD-10-CM | POA: Diagnosis not present

## 2020-08-21 DIAGNOSIS — D631 Anemia in chronic kidney disease: Secondary | ICD-10-CM | POA: Diagnosis not present

## 2020-08-21 DIAGNOSIS — Z992 Dependence on renal dialysis: Secondary | ICD-10-CM | POA: Diagnosis not present

## 2020-08-21 DIAGNOSIS — N2581 Secondary hyperparathyroidism of renal origin: Secondary | ICD-10-CM | POA: Diagnosis not present

## 2020-08-21 DIAGNOSIS — R110 Nausea: Secondary | ICD-10-CM | POA: Diagnosis not present

## 2020-08-23 DIAGNOSIS — D631 Anemia in chronic kidney disease: Secondary | ICD-10-CM | POA: Diagnosis not present

## 2020-08-23 DIAGNOSIS — R110 Nausea: Secondary | ICD-10-CM | POA: Diagnosis not present

## 2020-08-23 DIAGNOSIS — N186 End stage renal disease: Secondary | ICD-10-CM | POA: Diagnosis not present

## 2020-08-23 DIAGNOSIS — Z992 Dependence on renal dialysis: Secondary | ICD-10-CM | POA: Diagnosis not present

## 2020-08-23 DIAGNOSIS — D509 Iron deficiency anemia, unspecified: Secondary | ICD-10-CM | POA: Diagnosis not present

## 2020-08-23 DIAGNOSIS — N2581 Secondary hyperparathyroidism of renal origin: Secondary | ICD-10-CM | POA: Diagnosis not present

## 2020-08-25 DIAGNOSIS — R110 Nausea: Secondary | ICD-10-CM | POA: Diagnosis not present

## 2020-08-25 DIAGNOSIS — N186 End stage renal disease: Secondary | ICD-10-CM | POA: Diagnosis not present

## 2020-08-25 DIAGNOSIS — D509 Iron deficiency anemia, unspecified: Secondary | ICD-10-CM | POA: Diagnosis not present

## 2020-08-25 DIAGNOSIS — N2581 Secondary hyperparathyroidism of renal origin: Secondary | ICD-10-CM | POA: Diagnosis not present

## 2020-08-25 DIAGNOSIS — Z992 Dependence on renal dialysis: Secondary | ICD-10-CM | POA: Diagnosis not present

## 2020-08-25 DIAGNOSIS — D631 Anemia in chronic kidney disease: Secondary | ICD-10-CM | POA: Diagnosis not present

## 2020-08-28 DIAGNOSIS — Z992 Dependence on renal dialysis: Secondary | ICD-10-CM | POA: Diagnosis not present

## 2020-08-28 DIAGNOSIS — N186 End stage renal disease: Secondary | ICD-10-CM | POA: Diagnosis not present

## 2020-08-30 DIAGNOSIS — N186 End stage renal disease: Secondary | ICD-10-CM | POA: Diagnosis not present

## 2020-08-30 DIAGNOSIS — D509 Iron deficiency anemia, unspecified: Secondary | ICD-10-CM | POA: Diagnosis not present

## 2020-08-30 DIAGNOSIS — N2581 Secondary hyperparathyroidism of renal origin: Secondary | ICD-10-CM | POA: Diagnosis not present

## 2020-08-30 DIAGNOSIS — Z992 Dependence on renal dialysis: Secondary | ICD-10-CM | POA: Diagnosis not present

## 2020-08-30 DIAGNOSIS — L03116 Cellulitis of left lower limb: Secondary | ICD-10-CM | POA: Diagnosis not present

## 2020-08-30 DIAGNOSIS — D631 Anemia in chronic kidney disease: Secondary | ICD-10-CM | POA: Diagnosis not present

## 2020-09-01 DIAGNOSIS — D509 Iron deficiency anemia, unspecified: Secondary | ICD-10-CM | POA: Diagnosis not present

## 2020-09-01 DIAGNOSIS — D631 Anemia in chronic kidney disease: Secondary | ICD-10-CM | POA: Diagnosis not present

## 2020-09-01 DIAGNOSIS — L03116 Cellulitis of left lower limb: Secondary | ICD-10-CM | POA: Diagnosis not present

## 2020-09-01 DIAGNOSIS — N2581 Secondary hyperparathyroidism of renal origin: Secondary | ICD-10-CM | POA: Diagnosis not present

## 2020-09-01 DIAGNOSIS — Z992 Dependence on renal dialysis: Secondary | ICD-10-CM | POA: Diagnosis not present

## 2020-09-01 DIAGNOSIS — N186 End stage renal disease: Secondary | ICD-10-CM | POA: Diagnosis not present

## 2020-09-04 DIAGNOSIS — N2581 Secondary hyperparathyroidism of renal origin: Secondary | ICD-10-CM | POA: Diagnosis not present

## 2020-09-04 DIAGNOSIS — D509 Iron deficiency anemia, unspecified: Secondary | ICD-10-CM | POA: Diagnosis not present

## 2020-09-04 DIAGNOSIS — Z992 Dependence on renal dialysis: Secondary | ICD-10-CM | POA: Diagnosis not present

## 2020-09-04 DIAGNOSIS — D631 Anemia in chronic kidney disease: Secondary | ICD-10-CM | POA: Diagnosis not present

## 2020-09-04 DIAGNOSIS — L03116 Cellulitis of left lower limb: Secondary | ICD-10-CM | POA: Diagnosis not present

## 2020-09-04 DIAGNOSIS — N186 End stage renal disease: Secondary | ICD-10-CM | POA: Diagnosis not present

## 2020-09-06 DIAGNOSIS — L03116 Cellulitis of left lower limb: Secondary | ICD-10-CM | POA: Diagnosis not present

## 2020-09-06 DIAGNOSIS — N2581 Secondary hyperparathyroidism of renal origin: Secondary | ICD-10-CM | POA: Diagnosis not present

## 2020-09-06 DIAGNOSIS — Z992 Dependence on renal dialysis: Secondary | ICD-10-CM | POA: Diagnosis not present

## 2020-09-06 DIAGNOSIS — D509 Iron deficiency anemia, unspecified: Secondary | ICD-10-CM | POA: Diagnosis not present

## 2020-09-06 DIAGNOSIS — D631 Anemia in chronic kidney disease: Secondary | ICD-10-CM | POA: Diagnosis not present

## 2020-09-06 DIAGNOSIS — N186 End stage renal disease: Secondary | ICD-10-CM | POA: Diagnosis not present

## 2020-09-10 DIAGNOSIS — R071 Chest pain on breathing: Secondary | ICD-10-CM | POA: Diagnosis not present

## 2020-09-10 DIAGNOSIS — N261 Atrophy of kidney (terminal): Secondary | ICD-10-CM | POA: Diagnosis not present

## 2020-09-10 DIAGNOSIS — I509 Heart failure, unspecified: Secondary | ICD-10-CM | POA: Diagnosis not present

## 2020-09-10 DIAGNOSIS — M1990 Unspecified osteoarthritis, unspecified site: Secondary | ICD-10-CM | POA: Diagnosis present

## 2020-09-10 DIAGNOSIS — J9621 Acute and chronic respiratory failure with hypoxia: Secondary | ICD-10-CM | POA: Insufficient documentation

## 2020-09-10 DIAGNOSIS — D631 Anemia in chronic kidney disease: Secondary | ICD-10-CM | POA: Diagnosis present

## 2020-09-10 DIAGNOSIS — I120 Hypertensive chronic kidney disease with stage 5 chronic kidney disease or end stage renal disease: Secondary | ICD-10-CM | POA: Diagnosis present

## 2020-09-10 DIAGNOSIS — Z9115 Patient's noncompliance with renal dialysis: Secondary | ICD-10-CM | POA: Diagnosis not present

## 2020-09-10 DIAGNOSIS — Z86711 Personal history of pulmonary embolism: Secondary | ICD-10-CM | POA: Diagnosis not present

## 2020-09-10 DIAGNOSIS — Z79899 Other long term (current) drug therapy: Secondary | ICD-10-CM | POA: Diagnosis not present

## 2020-09-10 DIAGNOSIS — E875 Hyperkalemia: Secondary | ICD-10-CM | POA: Diagnosis not present

## 2020-09-10 DIAGNOSIS — J9600 Acute respiratory failure, unspecified whether with hypoxia or hypercapnia: Secondary | ICD-10-CM | POA: Diagnosis not present

## 2020-09-10 DIAGNOSIS — G2581 Restless legs syndrome: Secondary | ICD-10-CM | POA: Diagnosis not present

## 2020-09-10 DIAGNOSIS — I161 Hypertensive emergency: Secondary | ICD-10-CM | POA: Diagnosis not present

## 2020-09-10 DIAGNOSIS — J811 Chronic pulmonary edema: Secondary | ICD-10-CM | POA: Diagnosis not present

## 2020-09-10 DIAGNOSIS — Z992 Dependence on renal dialysis: Secondary | ICD-10-CM | POA: Diagnosis not present

## 2020-09-10 DIAGNOSIS — E8770 Fluid overload, unspecified: Secondary | ICD-10-CM | POA: Diagnosis not present

## 2020-09-10 DIAGNOSIS — I517 Cardiomegaly: Secondary | ICD-10-CM | POA: Diagnosis not present

## 2020-09-10 DIAGNOSIS — I160 Hypertensive urgency: Secondary | ICD-10-CM | POA: Diagnosis not present

## 2020-09-10 DIAGNOSIS — Z6841 Body Mass Index (BMI) 40.0 and over, adult: Secondary | ICD-10-CM | POA: Diagnosis not present

## 2020-09-10 DIAGNOSIS — N186 End stage renal disease: Secondary | ICD-10-CM | POA: Diagnosis not present

## 2020-09-10 DIAGNOSIS — Z5321 Procedure and treatment not carried out due to patient leaving prior to being seen by health care provider: Secondary | ICD-10-CM | POA: Diagnosis not present

## 2020-09-10 DIAGNOSIS — M109 Gout, unspecified: Secondary | ICD-10-CM | POA: Diagnosis present

## 2020-09-10 DIAGNOSIS — J9601 Acute respiratory failure with hypoxia: Secondary | ICD-10-CM | POA: Diagnosis present

## 2020-09-10 DIAGNOSIS — Z7982 Long term (current) use of aspirin: Secondary | ICD-10-CM | POA: Diagnosis not present

## 2020-09-10 DIAGNOSIS — K219 Gastro-esophageal reflux disease without esophagitis: Secondary | ICD-10-CM | POA: Diagnosis present

## 2020-09-10 DIAGNOSIS — R0602 Shortness of breath: Secondary | ICD-10-CM | POA: Diagnosis not present

## 2020-09-15 DIAGNOSIS — D509 Iron deficiency anemia, unspecified: Secondary | ICD-10-CM | POA: Diagnosis not present

## 2020-09-15 DIAGNOSIS — L03116 Cellulitis of left lower limb: Secondary | ICD-10-CM | POA: Diagnosis not present

## 2020-09-15 DIAGNOSIS — N2581 Secondary hyperparathyroidism of renal origin: Secondary | ICD-10-CM | POA: Diagnosis not present

## 2020-09-15 DIAGNOSIS — Z992 Dependence on renal dialysis: Secondary | ICD-10-CM | POA: Diagnosis not present

## 2020-09-15 DIAGNOSIS — D631 Anemia in chronic kidney disease: Secondary | ICD-10-CM | POA: Diagnosis not present

## 2020-09-15 DIAGNOSIS — N186 End stage renal disease: Secondary | ICD-10-CM | POA: Diagnosis not present

## 2020-09-18 DIAGNOSIS — Z992 Dependence on renal dialysis: Secondary | ICD-10-CM | POA: Diagnosis not present

## 2020-09-18 DIAGNOSIS — N2581 Secondary hyperparathyroidism of renal origin: Secondary | ICD-10-CM | POA: Diagnosis not present

## 2020-09-18 DIAGNOSIS — D631 Anemia in chronic kidney disease: Secondary | ICD-10-CM | POA: Diagnosis not present

## 2020-09-18 DIAGNOSIS — L03116 Cellulitis of left lower limb: Secondary | ICD-10-CM | POA: Diagnosis not present

## 2020-09-18 DIAGNOSIS — N186 End stage renal disease: Secondary | ICD-10-CM | POA: Diagnosis not present

## 2020-09-18 DIAGNOSIS — D509 Iron deficiency anemia, unspecified: Secondary | ICD-10-CM | POA: Diagnosis not present

## 2020-09-20 DIAGNOSIS — J9610 Chronic respiratory failure, unspecified whether with hypoxia or hypercapnia: Secondary | ICD-10-CM | POA: Diagnosis not present

## 2020-09-20 DIAGNOSIS — N2581 Secondary hyperparathyroidism of renal origin: Secondary | ICD-10-CM | POA: Diagnosis not present

## 2020-09-20 DIAGNOSIS — L03116 Cellulitis of left lower limb: Secondary | ICD-10-CM | POA: Diagnosis not present

## 2020-09-20 DIAGNOSIS — R42 Dizziness and giddiness: Secondary | ICD-10-CM | POA: Diagnosis not present

## 2020-09-20 DIAGNOSIS — N186 End stage renal disease: Secondary | ICD-10-CM | POA: Diagnosis not present

## 2020-09-20 DIAGNOSIS — D509 Iron deficiency anemia, unspecified: Secondary | ICD-10-CM | POA: Diagnosis not present

## 2020-09-20 DIAGNOSIS — D631 Anemia in chronic kidney disease: Secondary | ICD-10-CM | POA: Diagnosis not present

## 2020-09-20 DIAGNOSIS — Z992 Dependence on renal dialysis: Secondary | ICD-10-CM | POA: Diagnosis not present

## 2020-09-22 DIAGNOSIS — Z992 Dependence on renal dialysis: Secondary | ICD-10-CM | POA: Diagnosis not present

## 2020-09-22 DIAGNOSIS — L03116 Cellulitis of left lower limb: Secondary | ICD-10-CM | POA: Diagnosis not present

## 2020-09-22 DIAGNOSIS — N186 End stage renal disease: Secondary | ICD-10-CM | POA: Diagnosis not present

## 2020-09-22 DIAGNOSIS — N2581 Secondary hyperparathyroidism of renal origin: Secondary | ICD-10-CM | POA: Diagnosis not present

## 2020-09-22 DIAGNOSIS — D509 Iron deficiency anemia, unspecified: Secondary | ICD-10-CM | POA: Diagnosis not present

## 2020-09-22 DIAGNOSIS — D631 Anemia in chronic kidney disease: Secondary | ICD-10-CM | POA: Diagnosis not present

## 2020-09-25 DIAGNOSIS — Z992 Dependence on renal dialysis: Secondary | ICD-10-CM | POA: Diagnosis not present

## 2020-09-25 DIAGNOSIS — D509 Iron deficiency anemia, unspecified: Secondary | ICD-10-CM | POA: Diagnosis not present

## 2020-09-25 DIAGNOSIS — N2581 Secondary hyperparathyroidism of renal origin: Secondary | ICD-10-CM | POA: Diagnosis not present

## 2020-09-25 DIAGNOSIS — N186 End stage renal disease: Secondary | ICD-10-CM | POA: Diagnosis not present

## 2020-09-25 DIAGNOSIS — L03116 Cellulitis of left lower limb: Secondary | ICD-10-CM | POA: Diagnosis not present

## 2020-09-25 DIAGNOSIS — D631 Anemia in chronic kidney disease: Secondary | ICD-10-CM | POA: Diagnosis not present

## 2020-09-27 DIAGNOSIS — N2581 Secondary hyperparathyroidism of renal origin: Secondary | ICD-10-CM | POA: Diagnosis not present

## 2020-09-27 DIAGNOSIS — D631 Anemia in chronic kidney disease: Secondary | ICD-10-CM | POA: Diagnosis not present

## 2020-09-27 DIAGNOSIS — Z992 Dependence on renal dialysis: Secondary | ICD-10-CM | POA: Diagnosis not present

## 2020-09-27 DIAGNOSIS — L03116 Cellulitis of left lower limb: Secondary | ICD-10-CM | POA: Diagnosis not present

## 2020-09-27 DIAGNOSIS — N186 End stage renal disease: Secondary | ICD-10-CM | POA: Diagnosis not present

## 2020-09-27 DIAGNOSIS — D509 Iron deficiency anemia, unspecified: Secondary | ICD-10-CM | POA: Diagnosis not present

## 2020-09-28 DIAGNOSIS — Z992 Dependence on renal dialysis: Secondary | ICD-10-CM | POA: Diagnosis not present

## 2020-09-28 DIAGNOSIS — N186 End stage renal disease: Secondary | ICD-10-CM | POA: Diagnosis not present

## 2020-09-29 DIAGNOSIS — Z992 Dependence on renal dialysis: Secondary | ICD-10-CM | POA: Diagnosis not present

## 2020-09-29 DIAGNOSIS — N186 End stage renal disease: Secondary | ICD-10-CM | POA: Diagnosis not present

## 2020-09-29 DIAGNOSIS — D509 Iron deficiency anemia, unspecified: Secondary | ICD-10-CM | POA: Diagnosis not present

## 2020-09-29 DIAGNOSIS — D631 Anemia in chronic kidney disease: Secondary | ICD-10-CM | POA: Diagnosis not present

## 2020-09-29 DIAGNOSIS — N2581 Secondary hyperparathyroidism of renal origin: Secondary | ICD-10-CM | POA: Diagnosis not present

## 2020-10-01 DIAGNOSIS — Z992 Dependence on renal dialysis: Secondary | ICD-10-CM | POA: Diagnosis not present

## 2020-10-01 DIAGNOSIS — I120 Hypertensive chronic kidney disease with stage 5 chronic kidney disease or end stage renal disease: Secondary | ICD-10-CM | POA: Diagnosis not present

## 2020-10-01 DIAGNOSIS — T82898A Other specified complication of vascular prosthetic devices, implants and grafts, initial encounter: Secondary | ICD-10-CM | POA: Diagnosis not present

## 2020-10-01 DIAGNOSIS — N186 End stage renal disease: Secondary | ICD-10-CM | POA: Diagnosis not present

## 2020-10-02 DIAGNOSIS — N2581 Secondary hyperparathyroidism of renal origin: Secondary | ICD-10-CM | POA: Diagnosis not present

## 2020-10-02 DIAGNOSIS — N186 End stage renal disease: Secondary | ICD-10-CM | POA: Diagnosis not present

## 2020-10-02 DIAGNOSIS — D509 Iron deficiency anemia, unspecified: Secondary | ICD-10-CM | POA: Diagnosis not present

## 2020-10-02 DIAGNOSIS — Z992 Dependence on renal dialysis: Secondary | ICD-10-CM | POA: Diagnosis not present

## 2020-10-02 DIAGNOSIS — D631 Anemia in chronic kidney disease: Secondary | ICD-10-CM | POA: Diagnosis not present

## 2020-10-04 DIAGNOSIS — D631 Anemia in chronic kidney disease: Secondary | ICD-10-CM | POA: Diagnosis not present

## 2020-10-04 DIAGNOSIS — N2581 Secondary hyperparathyroidism of renal origin: Secondary | ICD-10-CM | POA: Diagnosis not present

## 2020-10-04 DIAGNOSIS — Z992 Dependence on renal dialysis: Secondary | ICD-10-CM | POA: Diagnosis not present

## 2020-10-04 DIAGNOSIS — N186 End stage renal disease: Secondary | ICD-10-CM | POA: Diagnosis not present

## 2020-10-04 DIAGNOSIS — D509 Iron deficiency anemia, unspecified: Secondary | ICD-10-CM | POA: Diagnosis not present

## 2020-10-06 DIAGNOSIS — Z992 Dependence on renal dialysis: Secondary | ICD-10-CM | POA: Diagnosis not present

## 2020-10-06 DIAGNOSIS — N2581 Secondary hyperparathyroidism of renal origin: Secondary | ICD-10-CM | POA: Diagnosis not present

## 2020-10-06 DIAGNOSIS — D509 Iron deficiency anemia, unspecified: Secondary | ICD-10-CM | POA: Diagnosis not present

## 2020-10-06 DIAGNOSIS — D631 Anemia in chronic kidney disease: Secondary | ICD-10-CM | POA: Diagnosis not present

## 2020-10-06 DIAGNOSIS — N186 End stage renal disease: Secondary | ICD-10-CM | POA: Diagnosis not present

## 2020-10-09 DIAGNOSIS — D509 Iron deficiency anemia, unspecified: Secondary | ICD-10-CM | POA: Diagnosis not present

## 2020-10-09 DIAGNOSIS — D631 Anemia in chronic kidney disease: Secondary | ICD-10-CM | POA: Diagnosis not present

## 2020-10-09 DIAGNOSIS — N2581 Secondary hyperparathyroidism of renal origin: Secondary | ICD-10-CM | POA: Diagnosis not present

## 2020-10-09 DIAGNOSIS — N186 End stage renal disease: Secondary | ICD-10-CM | POA: Diagnosis not present

## 2020-10-09 DIAGNOSIS — Z992 Dependence on renal dialysis: Secondary | ICD-10-CM | POA: Diagnosis not present

## 2020-10-11 DIAGNOSIS — Z992 Dependence on renal dialysis: Secondary | ICD-10-CM | POA: Diagnosis not present

## 2020-10-11 DIAGNOSIS — N186 End stage renal disease: Secondary | ICD-10-CM | POA: Diagnosis not present

## 2020-10-11 DIAGNOSIS — N2581 Secondary hyperparathyroidism of renal origin: Secondary | ICD-10-CM | POA: Diagnosis not present

## 2020-10-11 DIAGNOSIS — D631 Anemia in chronic kidney disease: Secondary | ICD-10-CM | POA: Diagnosis not present

## 2020-10-11 DIAGNOSIS — D509 Iron deficiency anemia, unspecified: Secondary | ICD-10-CM | POA: Diagnosis not present

## 2020-10-13 DIAGNOSIS — N186 End stage renal disease: Secondary | ICD-10-CM | POA: Diagnosis not present

## 2020-10-13 DIAGNOSIS — Z992 Dependence on renal dialysis: Secondary | ICD-10-CM | POA: Diagnosis not present

## 2020-10-13 DIAGNOSIS — N2581 Secondary hyperparathyroidism of renal origin: Secondary | ICD-10-CM | POA: Diagnosis not present

## 2020-10-13 DIAGNOSIS — D631 Anemia in chronic kidney disease: Secondary | ICD-10-CM | POA: Diagnosis not present

## 2020-10-13 DIAGNOSIS — D509 Iron deficiency anemia, unspecified: Secondary | ICD-10-CM | POA: Diagnosis not present

## 2020-10-16 DIAGNOSIS — D509 Iron deficiency anemia, unspecified: Secondary | ICD-10-CM | POA: Diagnosis not present

## 2020-10-16 DIAGNOSIS — N2581 Secondary hyperparathyroidism of renal origin: Secondary | ICD-10-CM | POA: Diagnosis not present

## 2020-10-16 DIAGNOSIS — D631 Anemia in chronic kidney disease: Secondary | ICD-10-CM | POA: Diagnosis not present

## 2020-10-16 DIAGNOSIS — Z992 Dependence on renal dialysis: Secondary | ICD-10-CM | POA: Diagnosis not present

## 2020-10-16 DIAGNOSIS — N186 End stage renal disease: Secondary | ICD-10-CM | POA: Diagnosis not present

## 2020-10-18 DIAGNOSIS — D509 Iron deficiency anemia, unspecified: Secondary | ICD-10-CM | POA: Diagnosis not present

## 2020-10-18 DIAGNOSIS — Z992 Dependence on renal dialysis: Secondary | ICD-10-CM | POA: Diagnosis not present

## 2020-10-18 DIAGNOSIS — N186 End stage renal disease: Secondary | ICD-10-CM | POA: Diagnosis not present

## 2020-10-18 DIAGNOSIS — N2581 Secondary hyperparathyroidism of renal origin: Secondary | ICD-10-CM | POA: Diagnosis not present

## 2020-10-18 DIAGNOSIS — D631 Anemia in chronic kidney disease: Secondary | ICD-10-CM | POA: Diagnosis not present

## 2020-10-19 ENCOUNTER — Other Ambulatory Visit: Payer: Self-pay

## 2020-10-19 ENCOUNTER — Ambulatory Visit (INDEPENDENT_AMBULATORY_CARE_PROVIDER_SITE_OTHER): Payer: Medicare Other | Admitting: Pulmonary Disease

## 2020-10-19 ENCOUNTER — Encounter: Payer: Self-pay | Admitting: Pulmonary Disease

## 2020-10-19 VITALS — BP 138/80 | HR 83 | Temp 97.3°F | Ht 64.0 in | Wt 290.0 lb

## 2020-10-19 DIAGNOSIS — R0600 Dyspnea, unspecified: Secondary | ICD-10-CM | POA: Diagnosis not present

## 2020-10-19 NOTE — Progress Notes (Signed)
Christine Horn    [id number]    Jul 10, 1965  Primary Care Physician:Pcp, No  Referring Physician: Marco Collie, MD [address] [place],  [place] [address]  Chief complaint: Consult for dyspnea  HPI: 55 year old with end-stage renal disease on hemodialysis, hypertension, morbid obesity, pulmonary embolism, osteoarthritis, restless leg syndrome  Admitted to [facility] on January 2022 for sepsis secondary to community-acquired bacterial pneumonia [tested negative for COVID], volume overload, pulmonary edema with hypokalemia in the setting of missed dialysis.  She was treated with antibiotics, additional dialysis and treatments with improvement.  She was discharged on 2 L oxygen  She was admitted again and March 2022 at [facility] with hypertensive emergency, acute on chronic respiratory failure with hypoxia due to volume overload in the setting of missed dialysis.  Patient left AGAINST MEDICAL ADVICE  She is here for evaluation of hypoxia as she is still on oxygen and wants to get off it.  History notable for PE secondary to DVT associated with hemodialysis catheter in 2014 for which she received 6 months of Coumadin.  She currently has an AV fistula and is being evaluated for kidney transplant.  Pets: Dog Occupation: Used to work as a Designer, multimedia for [employer].  Currently on disability Exposures: No mold, hot tub, Customer service manager.  No feather pillows or comforters Smoking history: Never smoker Travel history: No significant travel history Relevant family history: No family history of lung disease  Outpatient Encounter Medications as of 10/19/2020  Medication Sig  . acetaminophen (TYLENOL) 325 MG tablet Take 650 mg by mouth every 6 (six) hours as needed for mild pain or headache.  Marland Kitchen aspirin 81 MG EC tablet Take 81 mg by mouth daily.  . Calcium Acetate, Phos Binder, (CALCIUM ACETATE PO) Take 1,334 mg by mouth 3 (three) times daily.   . carvedilol (COREG) 12.5 MG tablet Take  12.5 mg by mouth 2 (two) times daily.  . fluticasone (FLONASE) 50 MCG/ACT nasal spray Place 1 spray into both nostrils as needed for allergies.  Marland Kitchen gabapentin (NEURONTIN) 300 MG capsule Take 300 mg by mouth at bedtime as needed (pain).  Marland Kitchen lidocaine-prilocaine (EMLA) cream Apply 1 application topically Every Tuesday,Thursday,and Saturday with dialysis.   Marland Kitchen meclizine (ANTIVERT) 12.5 MG tablet Take 12.5 mg by mouth 3 (three) times daily.  . Melatonin 10 MG TABS Take 10 mg by mouth at bedtime as needed (for sleep).   Marland Kitchen omeprazole (PRILOSEC) 20 MG capsule Take 20 mg by mouth daily.  . ondansetron (ZOFRAN-ODT) 4 MG disintegrating tablet Take 4 mg by mouth every 6 (six) hours as needed for nausea or vomiting.  Marland Kitchen PARoxetine (PAXIL) 10 MG tablet Take 10 mg by mouth daily.  . promethazine (PHENERGAN) 25 MG tablet Take 25 mg by mouth every 4 (four) hours as needed for nausea or vomiting.   Marland Kitchen amoxicillin (AMOXIL) 500 MG capsule Take 1,000 mg by mouth 2 (two) times daily.  . clarithromycin (BIAXIN) 500 MG tablet Take 1 tablet (500 mg total) by mouth daily.  . diclofenac Sodium (VOLTAREN) 1 % GEL Apply 2 g topically 4 (four) times daily. (Patient not taking: No sig reported)  . predniSONE (DELTASONE) 20 MG tablet Take 1 tablet (20 mg total) by mouth 2 (two) times daily.  Marland Kitchen torsemide (DEMADEX) 100 MG tablet Take 100 mg by mouth daily.   No facility-administered encounter medications on file as of 10/19/2020.    Allergies as of 10/19/2020 - Review Complete 10/19/2020  Allergen  Reaction Noted  . Cozaar [losartan potassium] Anaphylaxis 12/06/2018  . Vancomycin  04/20/2019    Past Medical History:  Diagnosis Date  . Anemia    On Epogen from Nephrologist  . Cellulitis of leg   . Chronic gout   . Chronic kidney disease    Stage 5  . Lower leg edema    Left  . Pulmonary emboli ([facility])     No past surgical history on file.  Family History  Problem Relation Age of Onset  . Hypertension Mother   .  Hyperlipidemia Mother   . Diabetes Mother   . Hypertension Father   . Hyperlipidemia Father   . Diabetes Father   . Hypertension Sister   . Diabetes Sister   . Mental illness Sister     Social History   Socioeconomic History  . Marital status: Married    Spouse name: Not on file  . Number of children: Not on file  . Years of education: Not on file  . Highest education level: Not on file  Occupational History  . Not on file  Tobacco Use  . Smoking status: Never Smoker  . Smokeless tobacco: Never Used  Substance and Sexual Activity  . Alcohol use: Yes    Comment: Occasional  . Drug use: Not on file  . Sexual activity: Not on file  Other Topics Concern  . Not on file  Social History Narrative  . Not on file   Social Determinants of Health   Financial Resource Strain: Not on file  Food Insecurity: Not on file  Transportation Needs: Not on file  Physical Activity: Not on file  Stress: Not on file  Social Connections: Not on file  Intimate Partner Violence: Not on file    Review of systems: Review of Systems  Constitutional: Negative for fever and chills.  HENT: Negative.   Eyes: Negative for blurred vision.  Respiratory: as per HPI  Cardiovascular: Negative for chest pain and palpitations.  Gastrointestinal: Negative for vomiting, diarrhea, blood per rectum. Genitourinary: Negative for dysuria, urgency, frequency and hematuria.  Musculoskeletal: Negative for myalgias, back pain and joint pain.  Skin: Negative for itching and rash.  Neurological: Negative for dizziness, tremors, focal weakness, seizures and loss of consciousness.  Endo/Heme/Allergies: Negative for environmental allergies.  Psychiatric/Behavioral: Negative for depression, suicidal ideas and hallucinations.  All other systems reviewed and are negative.  Physical Exam: Blood pressure 138/80, pulse 83, temperature (!) 97.3 F (36.3 C), temperature source Temporal, height 5\' 4"  (1.626 m), weight 290  lb (131.5 kg), SpO2 96 %. Gen:      No acute distress HEENT:  EOMI, sclera anicteric Neck:     No masses; no thyromegaly Lungs:    Clear to auscultation bilaterally; normal respiratory effort CV:         Regular rate and rhythm; no murmurs Abd:      + bowel sounds; soft, non-tender; no palpable masses, no distension Ext:    No edema; adequate peripheral perfusion Skin:      Warm and dry; no rash Neuro: alert and oriented x 3 Psych: normal mood and affect  Data Reviewed: Imaging: CTA 09/10/2020-  1. No evidence for acute pulmonary embolism.  2. Cardiogenic pulmonary edema. A superimposed infectious process is better excluded clinically.  3. Cholecystectomy.  4. Mildly prominent mediastinal lymph nodes may be reactive.   PFTs:  Labs:  Assessment:  Acute on chronic respiratory failure Recent admissiona for community-acquired pneumonia, volume overload She remains hypoxemic and  continues on supplemental oxygen.  I suspect that volume overload from end-stage renal disease is her main issue. We will schedule high-res CT and PFTs for further evaluation  Plan/Recommendations: High-res CT, PFTs  Marshell Garfinkel MD [facility] 10/19/2020, 1:38 PM  CC: Marco Collie, MD

## 2020-10-19 NOTE — Patient Instructions (Signed)
We will schedule you for high-resolution CT to be done at [facility] Follow-up PFTs and return to clinic in 1 to 2 months on the same day

## 2020-10-20 DIAGNOSIS — Z992 Dependence on renal dialysis: Secondary | ICD-10-CM | POA: Diagnosis not present

## 2020-10-20 DIAGNOSIS — D509 Iron deficiency anemia, unspecified: Secondary | ICD-10-CM | POA: Diagnosis not present

## 2020-10-20 DIAGNOSIS — N2581 Secondary hyperparathyroidism of renal origin: Secondary | ICD-10-CM | POA: Diagnosis not present

## 2020-10-20 DIAGNOSIS — N186 End stage renal disease: Secondary | ICD-10-CM | POA: Diagnosis not present

## 2020-10-20 DIAGNOSIS — D631 Anemia in chronic kidney disease: Secondary | ICD-10-CM | POA: Diagnosis not present

## 2020-10-23 DIAGNOSIS — N186 End stage renal disease: Secondary | ICD-10-CM | POA: Diagnosis not present

## 2020-10-23 DIAGNOSIS — N2581 Secondary hyperparathyroidism of renal origin: Secondary | ICD-10-CM | POA: Diagnosis not present

## 2020-10-23 DIAGNOSIS — Z992 Dependence on renal dialysis: Secondary | ICD-10-CM | POA: Diagnosis not present

## 2020-10-23 DIAGNOSIS — D509 Iron deficiency anemia, unspecified: Secondary | ICD-10-CM | POA: Diagnosis not present

## 2020-10-23 DIAGNOSIS — D631 Anemia in chronic kidney disease: Secondary | ICD-10-CM | POA: Diagnosis not present

## 2020-10-24 DIAGNOSIS — R0600 Dyspnea, unspecified: Secondary | ICD-10-CM | POA: Diagnosis not present

## 2020-10-24 DIAGNOSIS — R0602 Shortness of breath: Secondary | ICD-10-CM | POA: Diagnosis not present

## 2020-10-25 DIAGNOSIS — I120 Hypertensive chronic kidney disease with stage 5 chronic kidney disease or end stage renal disease: Secondary | ICD-10-CM | POA: Diagnosis not present

## 2020-10-25 DIAGNOSIS — M545 Low back pain, unspecified: Secondary | ICD-10-CM | POA: Diagnosis not present

## 2020-10-25 DIAGNOSIS — M1712 Unilateral primary osteoarthritis, left knee: Secondary | ICD-10-CM | POA: Diagnosis not present

## 2020-10-25 DIAGNOSIS — Z992 Dependence on renal dialysis: Secondary | ICD-10-CM | POA: Diagnosis not present

## 2020-10-25 DIAGNOSIS — K219 Gastro-esophageal reflux disease without esophagitis: Secondary | ICD-10-CM | POA: Diagnosis not present

## 2020-10-25 DIAGNOSIS — N186 End stage renal disease: Secondary | ICD-10-CM | POA: Diagnosis not present

## 2020-10-25 DIAGNOSIS — M25562 Pain in left knee: Secondary | ICD-10-CM | POA: Diagnosis not present

## 2020-10-25 DIAGNOSIS — M109 Gout, unspecified: Secondary | ICD-10-CM | POA: Diagnosis not present

## 2020-10-25 DIAGNOSIS — Z79899 Other long term (current) drug therapy: Secondary | ICD-10-CM | POA: Diagnosis not present

## 2020-10-25 DIAGNOSIS — Z7982 Long term (current) use of aspirin: Secondary | ICD-10-CM | POA: Diagnosis not present

## 2020-10-25 DIAGNOSIS — Z86711 Personal history of pulmonary embolism: Secondary | ICD-10-CM | POA: Diagnosis not present

## 2020-10-25 DIAGNOSIS — S8002XA Contusion of left knee, initial encounter: Secondary | ICD-10-CM | POA: Diagnosis not present

## 2020-10-25 DIAGNOSIS — D631 Anemia in chronic kidney disease: Secondary | ICD-10-CM | POA: Diagnosis not present

## 2020-10-25 DIAGNOSIS — M25462 Effusion, left knee: Secondary | ICD-10-CM | POA: Diagnosis not present

## 2020-10-26 DIAGNOSIS — M542 Cervicalgia: Secondary | ICD-10-CM | POA: Diagnosis not present

## 2020-10-26 DIAGNOSIS — M25562 Pain in left knee: Secondary | ICD-10-CM | POA: Diagnosis not present

## 2020-10-27 DIAGNOSIS — D631 Anemia in chronic kidney disease: Secondary | ICD-10-CM | POA: Diagnosis not present

## 2020-10-27 DIAGNOSIS — N2581 Secondary hyperparathyroidism of renal origin: Secondary | ICD-10-CM | POA: Diagnosis not present

## 2020-10-27 DIAGNOSIS — Z992 Dependence on renal dialysis: Secondary | ICD-10-CM | POA: Diagnosis not present

## 2020-10-27 DIAGNOSIS — D509 Iron deficiency anemia, unspecified: Secondary | ICD-10-CM | POA: Diagnosis not present

## 2020-10-27 DIAGNOSIS — N186 End stage renal disease: Secondary | ICD-10-CM | POA: Diagnosis not present

## 2020-10-28 DIAGNOSIS — N186 End stage renal disease: Secondary | ICD-10-CM | POA: Diagnosis not present

## 2020-10-28 DIAGNOSIS — Z992 Dependence on renal dialysis: Secondary | ICD-10-CM | POA: Diagnosis not present

## 2020-10-30 DIAGNOSIS — Z992 Dependence on renal dialysis: Secondary | ICD-10-CM | POA: Diagnosis not present

## 2020-10-30 DIAGNOSIS — N2581 Secondary hyperparathyroidism of renal origin: Secondary | ICD-10-CM | POA: Diagnosis not present

## 2020-10-30 DIAGNOSIS — D631 Anemia in chronic kidney disease: Secondary | ICD-10-CM | POA: Diagnosis not present

## 2020-10-30 DIAGNOSIS — D509 Iron deficiency anemia, unspecified: Secondary | ICD-10-CM | POA: Diagnosis not present

## 2020-10-30 DIAGNOSIS — N186 End stage renal disease: Secondary | ICD-10-CM | POA: Diagnosis not present

## 2020-11-01 ENCOUNTER — Encounter: Payer: Self-pay | Admitting: Pulmonary Disease

## 2020-11-01 DIAGNOSIS — N2581 Secondary hyperparathyroidism of renal origin: Secondary | ICD-10-CM | POA: Diagnosis not present

## 2020-11-01 DIAGNOSIS — D631 Anemia in chronic kidney disease: Secondary | ICD-10-CM | POA: Diagnosis not present

## 2020-11-01 DIAGNOSIS — Z992 Dependence on renal dialysis: Secondary | ICD-10-CM | POA: Diagnosis not present

## 2020-11-01 DIAGNOSIS — N186 End stage renal disease: Secondary | ICD-10-CM | POA: Diagnosis not present

## 2020-11-01 DIAGNOSIS — D509 Iron deficiency anemia, unspecified: Secondary | ICD-10-CM | POA: Diagnosis not present

## 2020-11-01 NOTE — Patient Instructions (Signed)
I have called the pt and she is aware of PM recs.  She has a pending appt in June.

## 2020-11-01 NOTE — Progress Notes (Signed)
High-resolution CT chest from [facility] 10/24/2020 Subtle groundglass and septal thickening of the lung base, linear scarring in the lingula, atrophy of kidneys, aortic and coronary atherosclerosis. I have reviewed the images personally.  Please let the patient know that CT does not show any significant interstitial lung disease.  We will review PFTs and discussed with her at time of return visit  Marshell Garfinkel MD [facility] 11/01/2020, 11:54 AM

## 2020-11-03 DIAGNOSIS — S22059A Unspecified fracture of T5-T6 vertebra, initial encounter for closed fracture: Secondary | ICD-10-CM | POA: Diagnosis not present

## 2020-11-03 DIAGNOSIS — Y999 Unspecified external cause status: Secondary | ICD-10-CM | POA: Diagnosis not present

## 2020-11-03 DIAGNOSIS — S12400A Unspecified displaced fracture of fifth cervical vertebra, initial encounter for closed fracture: Secondary | ICD-10-CM | POA: Diagnosis not present

## 2020-11-03 DIAGNOSIS — R296 Repeated falls: Secondary | ICD-10-CM | POA: Diagnosis not present

## 2020-11-03 DIAGNOSIS — W1839XA Other fall on same level, initial encounter: Secondary | ICD-10-CM | POA: Diagnosis not present

## 2020-11-03 DIAGNOSIS — M4802 Spinal stenosis, cervical region: Secondary | ICD-10-CM | POA: Diagnosis not present

## 2020-11-03 DIAGNOSIS — I120 Hypertensive chronic kidney disease with stage 5 chronic kidney disease or end stage renal disease: Secondary | ICD-10-CM | POA: Diagnosis not present

## 2020-11-03 DIAGNOSIS — Z992 Dependence on renal dialysis: Secondary | ICD-10-CM | POA: Diagnosis not present

## 2020-11-03 DIAGNOSIS — M2548 Effusion, other site: Secondary | ICD-10-CM | POA: Diagnosis not present

## 2020-11-03 DIAGNOSIS — M549 Dorsalgia, unspecified: Secondary | ICD-10-CM | POA: Diagnosis not present

## 2020-11-03 DIAGNOSIS — S129XXA Fracture of neck, unspecified, initial encounter: Secondary | ICD-10-CM | POA: Diagnosis not present

## 2020-11-03 DIAGNOSIS — R0902 Hypoxemia: Secondary | ICD-10-CM | POA: Diagnosis not present

## 2020-11-03 DIAGNOSIS — S22008A Other fracture of unspecified thoracic vertebra, initial encounter for closed fracture: Secondary | ICD-10-CM | POA: Diagnosis not present

## 2020-11-03 DIAGNOSIS — R21 Rash and other nonspecific skin eruption: Secondary | ICD-10-CM | POA: Diagnosis not present

## 2020-11-03 DIAGNOSIS — S22079A Unspecified fracture of T9-T10 vertebra, initial encounter for closed fracture: Secondary | ICD-10-CM | POA: Diagnosis not present

## 2020-11-03 DIAGNOSIS — M546 Pain in thoracic spine: Secondary | ICD-10-CM | POA: Diagnosis not present

## 2020-11-03 DIAGNOSIS — W19XXXA Unspecified fall, initial encounter: Secondary | ICD-10-CM | POA: Diagnosis not present

## 2020-11-03 DIAGNOSIS — M4312 Spondylolisthesis, cervical region: Secondary | ICD-10-CM | POA: Diagnosis not present

## 2020-11-03 DIAGNOSIS — Z79899 Other long term (current) drug therapy: Secondary | ICD-10-CM | POA: Diagnosis not present

## 2020-11-03 DIAGNOSIS — I700 Atherosclerosis of aorta: Secondary | ICD-10-CM | POA: Diagnosis not present

## 2020-11-03 DIAGNOSIS — I517 Cardiomegaly: Secondary | ICD-10-CM | POA: Diagnosis not present

## 2020-11-03 DIAGNOSIS — M542 Cervicalgia: Secondary | ICD-10-CM | POA: Diagnosis not present

## 2020-11-03 DIAGNOSIS — R202 Paresthesia of skin: Secondary | ICD-10-CM | POA: Diagnosis not present

## 2020-11-03 DIAGNOSIS — Z043 Encounter for examination and observation following other accident: Secondary | ICD-10-CM | POA: Diagnosis not present

## 2020-11-03 DIAGNOSIS — R200 Anesthesia of skin: Secondary | ICD-10-CM | POA: Diagnosis not present

## 2020-11-03 DIAGNOSIS — M48061 Spinal stenosis, lumbar region without neurogenic claudication: Secondary | ICD-10-CM | POA: Diagnosis not present

## 2020-11-03 DIAGNOSIS — N186 End stage renal disease: Secondary | ICD-10-CM | POA: Diagnosis not present

## 2020-11-03 DIAGNOSIS — R52 Pain, unspecified: Secondary | ICD-10-CM | POA: Diagnosis not present

## 2020-11-03 DIAGNOSIS — R531 Weakness: Secondary | ICD-10-CM | POA: Diagnosis not present

## 2020-11-04 DIAGNOSIS — R1310 Dysphagia, unspecified: Secondary | ICD-10-CM | POA: Diagnosis not present

## 2020-11-04 DIAGNOSIS — S14109D Unspecified injury at unspecified level of cervical spinal cord, subsequent encounter: Secondary | ICD-10-CM | POA: Diagnosis not present

## 2020-11-04 DIAGNOSIS — W19XXXA Unspecified fall, initial encounter: Secondary | ICD-10-CM | POA: Insufficient documentation

## 2020-11-04 DIAGNOSIS — Z8616 Personal history of COVID-19: Secondary | ICD-10-CM | POA: Diagnosis not present

## 2020-11-04 DIAGNOSIS — M79605 Pain in left leg: Secondary | ICD-10-CM | POA: Diagnosis not present

## 2020-11-04 DIAGNOSIS — D62 Acute posthemorrhagic anemia: Secondary | ICD-10-CM | POA: Diagnosis not present

## 2020-11-04 DIAGNOSIS — Z043 Encounter for examination and observation following other accident: Secondary | ICD-10-CM | POA: Diagnosis not present

## 2020-11-04 DIAGNOSIS — Z9981 Dependence on supplemental oxygen: Secondary | ICD-10-CM | POA: Diagnosis not present

## 2020-11-04 DIAGNOSIS — Y999 Unspecified external cause status: Secondary | ICD-10-CM | POA: Diagnosis not present

## 2020-11-04 DIAGNOSIS — M109 Gout, unspecified: Secondary | ICD-10-CM | POA: Diagnosis present

## 2020-11-04 DIAGNOSIS — R042 Hemoptysis: Secondary | ICD-10-CM | POA: Diagnosis not present

## 2020-11-04 DIAGNOSIS — N189 Chronic kidney disease, unspecified: Secondary | ICD-10-CM | POA: Diagnosis not present

## 2020-11-04 DIAGNOSIS — M546 Pain in thoracic spine: Secondary | ICD-10-CM | POA: Diagnosis not present

## 2020-11-04 DIAGNOSIS — M436 Torticollis: Secondary | ICD-10-CM | POA: Diagnosis not present

## 2020-11-04 DIAGNOSIS — M542 Cervicalgia: Secondary | ICD-10-CM | POA: Diagnosis not present

## 2020-11-04 DIAGNOSIS — M79604 Pain in right leg: Secondary | ICD-10-CM | POA: Diagnosis not present

## 2020-11-04 DIAGNOSIS — Z743 Need for continuous supervision: Secondary | ICD-10-CM | POA: Diagnosis not present

## 2020-11-04 DIAGNOSIS — R5381 Other malaise: Secondary | ICD-10-CM | POA: Diagnosis not present

## 2020-11-04 DIAGNOSIS — J9610 Chronic respiratory failure, unspecified whether with hypoxia or hypercapnia: Secondary | ICD-10-CM | POA: Diagnosis present

## 2020-11-04 DIAGNOSIS — S138XXA Sprain of joints and ligaments of other parts of neck, initial encounter: Secondary | ICD-10-CM | POA: Diagnosis not present

## 2020-11-04 DIAGNOSIS — S22058D Other fracture of T5-T6 vertebra, subsequent encounter for fracture with routine healing: Secondary | ICD-10-CM | POA: Diagnosis not present

## 2020-11-04 DIAGNOSIS — Z992 Dependence on renal dialysis: Secondary | ICD-10-CM | POA: Diagnosis not present

## 2020-11-04 DIAGNOSIS — S14104A Unspecified injury at C4 level of cervical spinal cord, initial encounter: Secondary | ICD-10-CM | POA: Diagnosis not present

## 2020-11-04 DIAGNOSIS — W1839XA Other fall on same level, initial encounter: Secondary | ICD-10-CM | POA: Diagnosis not present

## 2020-11-04 DIAGNOSIS — M545 Low back pain, unspecified: Secondary | ICD-10-CM | POA: Diagnosis not present

## 2020-11-04 DIAGNOSIS — R0902 Hypoxemia: Secondary | ICD-10-CM | POA: Diagnosis not present

## 2020-11-04 DIAGNOSIS — G992 Myelopathy in diseases classified elsewhere: Secondary | ICD-10-CM | POA: Diagnosis not present

## 2020-11-04 DIAGNOSIS — S199XXD Unspecified injury of neck, subsequent encounter: Secondary | ICD-10-CM | POA: Diagnosis not present

## 2020-11-04 DIAGNOSIS — F32A Depression, unspecified: Secondary | ICD-10-CM | POA: Diagnosis not present

## 2020-11-04 DIAGNOSIS — S22068D Other fracture of T7-T8 thoracic vertebra, subsequent encounter for fracture with routine healing: Secondary | ICD-10-CM | POA: Diagnosis not present

## 2020-11-04 DIAGNOSIS — S22048A Other fracture of fourth thoracic vertebra, initial encounter for closed fracture: Secondary | ICD-10-CM | POA: Diagnosis present

## 2020-11-04 DIAGNOSIS — E872 Acidosis: Secondary | ICD-10-CM | POA: Diagnosis present

## 2020-11-04 DIAGNOSIS — R29898 Other symptoms and signs involving the musculoskeletal system: Secondary | ICD-10-CM | POA: Diagnosis not present

## 2020-11-04 DIAGNOSIS — R7989 Other specified abnormal findings of blood chemistry: Secondary | ICD-10-CM | POA: Diagnosis present

## 2020-11-04 DIAGNOSIS — R200 Anesthesia of skin: Secondary | ICD-10-CM | POA: Diagnosis not present

## 2020-11-04 DIAGNOSIS — I120 Hypertensive chronic kidney disease with stage 5 chronic kidney disease or end stage renal disease: Secondary | ICD-10-CM | POA: Diagnosis not present

## 2020-11-04 DIAGNOSIS — M4802 Spinal stenosis, cervical region: Secondary | ICD-10-CM | POA: Diagnosis not present

## 2020-11-04 DIAGNOSIS — M4312 Spondylolisthesis, cervical region: Secondary | ICD-10-CM | POA: Diagnosis not present

## 2020-11-04 DIAGNOSIS — S14154A Other incomplete lesion at C4 level of cervical spinal cord, initial encounter: Secondary | ICD-10-CM | POA: Diagnosis not present

## 2020-11-04 DIAGNOSIS — S199XXA Unspecified injury of neck, initial encounter: Secondary | ICD-10-CM | POA: Diagnosis not present

## 2020-11-04 DIAGNOSIS — S22078D Other fracture of T9-T10 vertebra, subsequent encounter for fracture with routine healing: Secondary | ICD-10-CM | POA: Diagnosis not present

## 2020-11-04 DIAGNOSIS — S12400A Unspecified displaced fracture of fifth cervical vertebra, initial encounter for closed fracture: Secondary | ICD-10-CM | POA: Diagnosis not present

## 2020-11-04 DIAGNOSIS — S22008A Other fracture of unspecified thoracic vertebra, initial encounter for closed fracture: Secondary | ICD-10-CM | POA: Insufficient documentation

## 2020-11-04 DIAGNOSIS — M7989 Other specified soft tissue disorders: Secondary | ICD-10-CM | POA: Diagnosis not present

## 2020-11-04 DIAGNOSIS — S32058A Other fracture of fifth lumbar vertebra, initial encounter for closed fracture: Secondary | ICD-10-CM | POA: Diagnosis present

## 2020-11-04 DIAGNOSIS — M62838 Other muscle spasm: Secondary | ICD-10-CM | POA: Diagnosis not present

## 2020-11-04 DIAGNOSIS — W19XXXD Unspecified fall, subsequent encounter: Secondary | ICD-10-CM | POA: Diagnosis not present

## 2020-11-04 DIAGNOSIS — S12490A Other displaced fracture of fifth cervical vertebra, initial encounter for closed fracture: Secondary | ICD-10-CM | POA: Diagnosis not present

## 2020-11-04 DIAGNOSIS — E875 Hyperkalemia: Secondary | ICD-10-CM | POA: Diagnosis present

## 2020-11-04 DIAGNOSIS — Z6841 Body Mass Index (BMI) 40.0 and over, adult: Secondary | ICD-10-CM | POA: Diagnosis not present

## 2020-11-04 DIAGNOSIS — S134XXA Sprain of ligaments of cervical spine, initial encounter: Secondary | ICD-10-CM | POA: Diagnosis not present

## 2020-11-04 DIAGNOSIS — R279 Unspecified lack of coordination: Secondary | ICD-10-CM | POA: Diagnosis not present

## 2020-11-04 DIAGNOSIS — J3489 Other specified disorders of nose and nasal sinuses: Secondary | ICD-10-CM | POA: Diagnosis not present

## 2020-11-04 DIAGNOSIS — S22068A Other fracture of T7-T8 thoracic vertebra, initial encounter for closed fracture: Secondary | ICD-10-CM | POA: Diagnosis not present

## 2020-11-04 DIAGNOSIS — J95831 Postprocedural hemorrhage and hematoma of a respiratory system organ or structure following other procedure: Secondary | ICD-10-CM | POA: Diagnosis not present

## 2020-11-04 DIAGNOSIS — N2581 Secondary hyperparathyroidism of renal origin: Secondary | ICD-10-CM | POA: Diagnosis not present

## 2020-11-04 DIAGNOSIS — I10 Essential (primary) hypertension: Secondary | ICD-10-CM | POA: Diagnosis not present

## 2020-11-04 DIAGNOSIS — N186 End stage renal disease: Secondary | ICD-10-CM | POA: Diagnosis not present

## 2020-11-04 DIAGNOSIS — G629 Polyneuropathy, unspecified: Secondary | ICD-10-CM | POA: Diagnosis not present

## 2020-11-04 DIAGNOSIS — S22078A Other fracture of T9-T10 vertebra, initial encounter for closed fracture: Secondary | ICD-10-CM | POA: Diagnosis not present

## 2020-11-04 DIAGNOSIS — D631 Anemia in chronic kidney disease: Secondary | ICD-10-CM | POA: Diagnosis not present

## 2020-11-04 DIAGNOSIS — R918 Other nonspecific abnormal finding of lung field: Secondary | ICD-10-CM | POA: Diagnosis not present

## 2020-11-04 DIAGNOSIS — J029 Acute pharyngitis, unspecified: Secondary | ICD-10-CM | POA: Diagnosis not present

## 2020-11-04 DIAGNOSIS — I129 Hypertensive chronic kidney disease with stage 1 through stage 4 chronic kidney disease, or unspecified chronic kidney disease: Secondary | ICD-10-CM | POA: Diagnosis not present

## 2020-11-04 DIAGNOSIS — S22058A Other fracture of T5-T6 vertebra, initial encounter for closed fracture: Secondary | ICD-10-CM | POA: Diagnosis not present

## 2020-11-04 DIAGNOSIS — R531 Weakness: Secondary | ICD-10-CM | POA: Diagnosis not present

## 2020-11-04 DIAGNOSIS — S12330A Unspecified traumatic displaced spondylolisthesis of fourth cervical vertebra, initial encounter for closed fracture: Secondary | ICD-10-CM | POA: Diagnosis not present

## 2020-11-04 DIAGNOSIS — R4781 Slurred speech: Secondary | ICD-10-CM | POA: Diagnosis not present

## 2020-11-04 DIAGNOSIS — Z79899 Other long term (current) drug therapy: Secondary | ICD-10-CM | POA: Diagnosis not present

## 2020-11-09 DIAGNOSIS — R918 Other nonspecific abnormal finding of lung field: Secondary | ICD-10-CM | POA: Diagnosis not present

## 2020-11-09 DIAGNOSIS — S22078D Other fracture of T9-T10 vertebra, subsequent encounter for fracture with routine healing: Secondary | ICD-10-CM | POA: Diagnosis not present

## 2020-11-09 DIAGNOSIS — J029 Acute pharyngitis, unspecified: Secondary | ICD-10-CM | POA: Diagnosis not present

## 2020-11-09 DIAGNOSIS — S199XXD Unspecified injury of neck, subsequent encounter: Secondary | ICD-10-CM | POA: Diagnosis not present

## 2020-11-09 DIAGNOSIS — J3489 Other specified disorders of nose and nasal sinuses: Secondary | ICD-10-CM | POA: Diagnosis not present

## 2020-11-09 DIAGNOSIS — R4781 Slurred speech: Secondary | ICD-10-CM | POA: Diagnosis not present

## 2020-11-09 DIAGNOSIS — M542 Cervicalgia: Secondary | ICD-10-CM | POA: Diagnosis not present

## 2020-11-09 DIAGNOSIS — N186 End stage renal disease: Secondary | ICD-10-CM | POA: Diagnosis not present

## 2020-11-09 DIAGNOSIS — S22068D Other fracture of T7-T8 thoracic vertebra, subsequent encounter for fracture with routine healing: Secondary | ICD-10-CM | POA: Diagnosis not present

## 2020-11-09 DIAGNOSIS — J95831 Postprocedural hemorrhage and hematoma of a respiratory system organ or structure following other procedure: Secondary | ICD-10-CM | POA: Diagnosis not present

## 2020-11-09 DIAGNOSIS — S22058D Other fracture of T5-T6 vertebra, subsequent encounter for fracture with routine healing: Secondary | ICD-10-CM | POA: Diagnosis not present

## 2020-11-09 DIAGNOSIS — I120 Hypertensive chronic kidney disease with stage 5 chronic kidney disease or end stage renal disease: Secondary | ICD-10-CM | POA: Diagnosis not present

## 2020-11-09 DIAGNOSIS — M436 Torticollis: Secondary | ICD-10-CM | POA: Diagnosis not present

## 2020-11-09 DIAGNOSIS — Z992 Dependence on renal dialysis: Secondary | ICD-10-CM | POA: Diagnosis not present

## 2020-11-09 DIAGNOSIS — R042 Hemoptysis: Secondary | ICD-10-CM | POA: Diagnosis not present

## 2020-11-10 DIAGNOSIS — I120 Hypertensive chronic kidney disease with stage 5 chronic kidney disease or end stage renal disease: Secondary | ICD-10-CM | POA: Diagnosis not present

## 2020-11-10 DIAGNOSIS — Z992 Dependence on renal dialysis: Secondary | ICD-10-CM | POA: Diagnosis not present

## 2020-11-10 DIAGNOSIS — N186 End stage renal disease: Secondary | ICD-10-CM | POA: Diagnosis not present

## 2020-11-11 DIAGNOSIS — S14109A Unspecified injury at unspecified level of cervical spinal cord, initial encounter: Secondary | ICD-10-CM | POA: Diagnosis not present

## 2020-11-11 DIAGNOSIS — M4312 Spondylolisthesis, cervical region: Secondary | ICD-10-CM | POA: Diagnosis not present

## 2020-11-11 DIAGNOSIS — Z96652 Presence of left artificial knee joint: Secondary | ICD-10-CM | POA: Diagnosis not present

## 2020-11-11 DIAGNOSIS — R42 Dizziness and giddiness: Secondary | ICD-10-CM | POA: Diagnosis not present

## 2020-11-11 DIAGNOSIS — R110 Nausea: Secondary | ICD-10-CM | POA: Diagnosis not present

## 2020-11-11 DIAGNOSIS — R1110 Vomiting, unspecified: Secondary | ICD-10-CM | POA: Diagnosis not present

## 2020-11-11 DIAGNOSIS — S22009A Unspecified fracture of unspecified thoracic vertebra, initial encounter for closed fracture: Secondary | ICD-10-CM | POA: Diagnosis not present

## 2020-11-11 DIAGNOSIS — D631 Anemia in chronic kidney disease: Secondary | ICD-10-CM | POA: Diagnosis not present

## 2020-11-11 DIAGNOSIS — Z981 Arthrodesis status: Secondary | ICD-10-CM | POA: Diagnosis not present

## 2020-11-11 DIAGNOSIS — N186 End stage renal disease: Secondary | ICD-10-CM | POA: Diagnosis not present

## 2020-11-11 DIAGNOSIS — I120 Hypertensive chronic kidney disease with stage 5 chronic kidney disease or end stage renal disease: Secondary | ICD-10-CM | POA: Diagnosis not present

## 2020-11-11 DIAGNOSIS — D509 Iron deficiency anemia, unspecified: Secondary | ICD-10-CM | POA: Diagnosis not present

## 2020-11-11 DIAGNOSIS — G609 Hereditary and idiopathic neuropathy, unspecified: Secondary | ICD-10-CM | POA: Diagnosis not present

## 2020-11-11 DIAGNOSIS — M436 Torticollis: Secondary | ICD-10-CM | POA: Diagnosis not present

## 2020-11-11 DIAGNOSIS — M170 Bilateral primary osteoarthritis of knee: Secondary | ICD-10-CM | POA: Diagnosis not present

## 2020-11-11 DIAGNOSIS — J3489 Other specified disorders of nose and nasal sinuses: Secondary | ICD-10-CM | POA: Diagnosis not present

## 2020-11-11 DIAGNOSIS — J9610 Chronic respiratory failure, unspecified whether with hypoxia or hypercapnia: Secondary | ICD-10-CM | POA: Diagnosis not present

## 2020-11-11 DIAGNOSIS — N2581 Secondary hyperparathyroidism of renal origin: Secondary | ICD-10-CM | POA: Diagnosis not present

## 2020-11-11 DIAGNOSIS — J029 Acute pharyngitis, unspecified: Secondary | ICD-10-CM | POA: Diagnosis not present

## 2020-11-11 DIAGNOSIS — W19XXXD Unspecified fall, subsequent encounter: Secondary | ICD-10-CM | POA: Diagnosis not present

## 2020-11-11 DIAGNOSIS — S22069D Unspecified fracture of T7-T8 vertebra, subsequent encounter for fracture with routine healing: Secondary | ICD-10-CM | POA: Diagnosis not present

## 2020-11-11 DIAGNOSIS — Z4689 Encounter for fitting and adjustment of other specified devices: Secondary | ICD-10-CM | POA: Diagnosis not present

## 2020-11-11 DIAGNOSIS — M150 Primary generalized (osteo)arthritis: Secondary | ICD-10-CM | POA: Diagnosis not present

## 2020-11-11 DIAGNOSIS — R4781 Slurred speech: Secondary | ICD-10-CM | POA: Diagnosis not present

## 2020-11-11 DIAGNOSIS — M4313 Spondylolisthesis, cervicothoracic region: Secondary | ICD-10-CM | POA: Diagnosis not present

## 2020-11-11 DIAGNOSIS — R042 Hemoptysis: Secondary | ICD-10-CM | POA: Diagnosis not present

## 2020-11-11 DIAGNOSIS — S22079D Unspecified fracture of T9-T10 vertebra, subsequent encounter for fracture with routine healing: Secondary | ICD-10-CM | POA: Diagnosis not present

## 2020-11-11 DIAGNOSIS — Z9981 Dependence on supplemental oxygen: Secondary | ICD-10-CM | POA: Diagnosis not present

## 2020-11-11 DIAGNOSIS — F320 Major depressive disorder, single episode, mild: Secondary | ICD-10-CM | POA: Diagnosis not present

## 2020-11-11 DIAGNOSIS — Z4789 Encounter for other orthopedic aftercare: Secondary | ICD-10-CM | POA: Diagnosis not present

## 2020-11-11 DIAGNOSIS — Z992 Dependence on renal dialysis: Secondary | ICD-10-CM | POA: Diagnosis not present

## 2020-11-11 DIAGNOSIS — S12000D Unspecified displaced fracture of first cervical vertebra, subsequent encounter for fracture with routine healing: Secondary | ICD-10-CM | POA: Diagnosis not present

## 2020-11-11 DIAGNOSIS — S22059D Unspecified fracture of T5-T6 vertebra, subsequent encounter for fracture with routine healing: Secondary | ICD-10-CM | POA: Diagnosis not present

## 2020-11-11 DIAGNOSIS — S12400D Unspecified displaced fracture of fifth cervical vertebra, subsequent encounter for fracture with routine healing: Secondary | ICD-10-CM | POA: Diagnosis not present

## 2020-11-12 DIAGNOSIS — S22009A Unspecified fracture of unspecified thoracic vertebra, initial encounter for closed fracture: Secondary | ICD-10-CM | POA: Diagnosis not present

## 2020-11-12 DIAGNOSIS — R110 Nausea: Secondary | ICD-10-CM | POA: Diagnosis not present

## 2020-11-12 DIAGNOSIS — R42 Dizziness and giddiness: Secondary | ICD-10-CM | POA: Diagnosis not present

## 2020-11-12 DIAGNOSIS — S14109A Unspecified injury at unspecified level of cervical spinal cord, initial encounter: Secondary | ICD-10-CM | POA: Diagnosis not present

## 2020-11-12 DIAGNOSIS — G609 Hereditary and idiopathic neuropathy, unspecified: Secondary | ICD-10-CM | POA: Diagnosis not present

## 2020-11-12 DIAGNOSIS — R1110 Vomiting, unspecified: Secondary | ICD-10-CM | POA: Diagnosis not present

## 2020-11-12 DIAGNOSIS — N186 End stage renal disease: Secondary | ICD-10-CM | POA: Diagnosis not present

## 2020-11-12 DIAGNOSIS — F320 Major depressive disorder, single episode, mild: Secondary | ICD-10-CM | POA: Diagnosis not present

## 2020-11-13 DIAGNOSIS — N2581 Secondary hyperparathyroidism of renal origin: Secondary | ICD-10-CM | POA: Diagnosis not present

## 2020-11-13 DIAGNOSIS — N186 End stage renal disease: Secondary | ICD-10-CM | POA: Diagnosis not present

## 2020-11-13 DIAGNOSIS — Z992 Dependence on renal dialysis: Secondary | ICD-10-CM | POA: Diagnosis not present

## 2020-11-13 DIAGNOSIS — D631 Anemia in chronic kidney disease: Secondary | ICD-10-CM | POA: Diagnosis not present

## 2020-11-13 DIAGNOSIS — D509 Iron deficiency anemia, unspecified: Secondary | ICD-10-CM | POA: Diagnosis not present

## 2020-11-15 DIAGNOSIS — Z992 Dependence on renal dialysis: Secondary | ICD-10-CM | POA: Diagnosis not present

## 2020-11-15 DIAGNOSIS — N2581 Secondary hyperparathyroidism of renal origin: Secondary | ICD-10-CM | POA: Diagnosis not present

## 2020-11-15 DIAGNOSIS — D631 Anemia in chronic kidney disease: Secondary | ICD-10-CM | POA: Diagnosis not present

## 2020-11-15 DIAGNOSIS — N186 End stage renal disease: Secondary | ICD-10-CM | POA: Diagnosis not present

## 2020-11-15 DIAGNOSIS — D509 Iron deficiency anemia, unspecified: Secondary | ICD-10-CM | POA: Diagnosis not present

## 2020-11-17 DIAGNOSIS — D631 Anemia in chronic kidney disease: Secondary | ICD-10-CM | POA: Diagnosis not present

## 2020-11-17 DIAGNOSIS — Z992 Dependence on renal dialysis: Secondary | ICD-10-CM | POA: Diagnosis not present

## 2020-11-17 DIAGNOSIS — D509 Iron deficiency anemia, unspecified: Secondary | ICD-10-CM | POA: Diagnosis not present

## 2020-11-17 DIAGNOSIS — N186 End stage renal disease: Secondary | ICD-10-CM | POA: Diagnosis not present

## 2020-11-17 DIAGNOSIS — N2581 Secondary hyperparathyroidism of renal origin: Secondary | ICD-10-CM | POA: Diagnosis not present

## 2020-11-20 DIAGNOSIS — Z981 Arthrodesis status: Secondary | ICD-10-CM | POA: Diagnosis not present

## 2020-11-20 DIAGNOSIS — N2581 Secondary hyperparathyroidism of renal origin: Secondary | ICD-10-CM | POA: Diagnosis not present

## 2020-11-20 DIAGNOSIS — Z992 Dependence on renal dialysis: Secondary | ICD-10-CM | POA: Diagnosis not present

## 2020-11-20 DIAGNOSIS — N186 End stage renal disease: Secondary | ICD-10-CM | POA: Diagnosis not present

## 2020-11-20 DIAGNOSIS — Z9889 Other specified postprocedural states: Secondary | ICD-10-CM | POA: Insufficient documentation

## 2020-11-20 DIAGNOSIS — D509 Iron deficiency anemia, unspecified: Secondary | ICD-10-CM | POA: Diagnosis not present

## 2020-11-20 DIAGNOSIS — D631 Anemia in chronic kidney disease: Secondary | ICD-10-CM | POA: Diagnosis not present

## 2020-11-20 DIAGNOSIS — M4312 Spondylolisthesis, cervical region: Secondary | ICD-10-CM | POA: Diagnosis not present

## 2020-11-20 DIAGNOSIS — M4313 Spondylolisthesis, cervicothoracic region: Secondary | ICD-10-CM | POA: Diagnosis not present

## 2020-11-22 DIAGNOSIS — U099 Post covid-19 condition, unspecified: Secondary | ICD-10-CM | POA: Diagnosis not present

## 2020-11-22 DIAGNOSIS — G2581 Restless legs syndrome: Secondary | ICD-10-CM | POA: Diagnosis not present

## 2020-11-22 DIAGNOSIS — M170 Bilateral primary osteoarthritis of knee: Secondary | ICD-10-CM | POA: Diagnosis not present

## 2020-11-22 DIAGNOSIS — S22009A Unspecified fracture of unspecified thoracic vertebra, initial encounter for closed fracture: Secondary | ICD-10-CM | POA: Diagnosis not present

## 2020-11-22 DIAGNOSIS — Z86718 Personal history of other venous thrombosis and embolism: Secondary | ICD-10-CM | POA: Diagnosis not present

## 2020-11-22 DIAGNOSIS — Z8616 Personal history of COVID-19: Secondary | ICD-10-CM | POA: Diagnosis not present

## 2020-11-22 DIAGNOSIS — I10 Essential (primary) hypertension: Secondary | ICD-10-CM | POA: Diagnosis not present

## 2020-11-22 DIAGNOSIS — S12400D Unspecified displaced fracture of fifth cervical vertebra, subsequent encounter for fracture with routine healing: Secondary | ICD-10-CM | POA: Diagnosis not present

## 2020-11-22 DIAGNOSIS — G629 Polyneuropathy, unspecified: Secondary | ICD-10-CM | POA: Diagnosis not present

## 2020-11-22 DIAGNOSIS — Z9981 Dependence on supplemental oxygen: Secondary | ICD-10-CM | POA: Diagnosis not present

## 2020-11-22 DIAGNOSIS — I120 Hypertensive chronic kidney disease with stage 5 chronic kidney disease or end stage renal disease: Secondary | ICD-10-CM | POA: Diagnosis not present

## 2020-11-22 DIAGNOSIS — R29898 Other symptoms and signs involving the musculoskeletal system: Secondary | ICD-10-CM | POA: Diagnosis not present

## 2020-11-22 DIAGNOSIS — D631 Anemia in chronic kidney disease: Secondary | ICD-10-CM | POA: Diagnosis not present

## 2020-11-22 DIAGNOSIS — S22069D Unspecified fracture of T7-T8 vertebra, subsequent encounter for fracture with routine healing: Secondary | ICD-10-CM | POA: Diagnosis not present

## 2020-11-22 DIAGNOSIS — S22079D Unspecified fracture of T9-T10 vertebra, subsequent encounter for fracture with routine healing: Secondary | ICD-10-CM | POA: Diagnosis not present

## 2020-11-22 DIAGNOSIS — R202 Paresthesia of skin: Secondary | ICD-10-CM | POA: Diagnosis not present

## 2020-11-22 DIAGNOSIS — S14109A Unspecified injury at unspecified level of cervical spinal cord, initial encounter: Secondary | ICD-10-CM | POA: Diagnosis not present

## 2020-11-22 DIAGNOSIS — K219 Gastro-esophageal reflux disease without esophagitis: Secondary | ICD-10-CM | POA: Diagnosis not present

## 2020-11-22 DIAGNOSIS — W07XXXD Fall from chair, subsequent encounter: Secondary | ICD-10-CM | POA: Diagnosis not present

## 2020-11-22 DIAGNOSIS — M8580 Other specified disorders of bone density and structure, unspecified site: Secondary | ICD-10-CM | POA: Diagnosis not present

## 2020-11-22 DIAGNOSIS — S22059D Unspecified fracture of T5-T6 vertebra, subsequent encounter for fracture with routine healing: Secondary | ICD-10-CM | POA: Diagnosis not present

## 2020-11-22 DIAGNOSIS — N186 End stage renal disease: Secondary | ICD-10-CM | POA: Diagnosis not present

## 2020-11-22 DIAGNOSIS — Z981 Arthrodesis status: Secondary | ICD-10-CM | POA: Diagnosis not present

## 2020-11-22 DIAGNOSIS — J9610 Chronic respiratory failure, unspecified whether with hypoxia or hypercapnia: Secondary | ICD-10-CM | POA: Diagnosis not present

## 2020-11-22 DIAGNOSIS — M109 Gout, unspecified: Secondary | ICD-10-CM | POA: Diagnosis not present

## 2020-11-24 DIAGNOSIS — Z992 Dependence on renal dialysis: Secondary | ICD-10-CM | POA: Diagnosis not present

## 2020-11-24 DIAGNOSIS — N2581 Secondary hyperparathyroidism of renal origin: Secondary | ICD-10-CM | POA: Diagnosis not present

## 2020-11-24 DIAGNOSIS — D509 Iron deficiency anemia, unspecified: Secondary | ICD-10-CM | POA: Diagnosis not present

## 2020-11-24 DIAGNOSIS — D631 Anemia in chronic kidney disease: Secondary | ICD-10-CM | POA: Diagnosis not present

## 2020-11-24 DIAGNOSIS — N186 End stage renal disease: Secondary | ICD-10-CM | POA: Diagnosis not present

## 2020-11-27 DIAGNOSIS — N186 End stage renal disease: Secondary | ICD-10-CM | POA: Diagnosis not present

## 2020-11-27 DIAGNOSIS — Z992 Dependence on renal dialysis: Secondary | ICD-10-CM | POA: Diagnosis not present

## 2020-11-27 DIAGNOSIS — D509 Iron deficiency anemia, unspecified: Secondary | ICD-10-CM | POA: Diagnosis not present

## 2020-11-27 DIAGNOSIS — N2581 Secondary hyperparathyroidism of renal origin: Secondary | ICD-10-CM | POA: Diagnosis not present

## 2020-11-27 DIAGNOSIS — D631 Anemia in chronic kidney disease: Secondary | ICD-10-CM | POA: Diagnosis not present

## 2020-11-28 DIAGNOSIS — N186 End stage renal disease: Secondary | ICD-10-CM | POA: Diagnosis not present

## 2020-11-28 DIAGNOSIS — Z992 Dependence on renal dialysis: Secondary | ICD-10-CM | POA: Diagnosis not present

## 2020-11-29 DIAGNOSIS — Z992 Dependence on renal dialysis: Secondary | ICD-10-CM | POA: Diagnosis not present

## 2020-11-29 DIAGNOSIS — N186 End stage renal disease: Secondary | ICD-10-CM | POA: Diagnosis not present

## 2020-11-29 DIAGNOSIS — N2581 Secondary hyperparathyroidism of renal origin: Secondary | ICD-10-CM | POA: Diagnosis not present

## 2020-11-29 DIAGNOSIS — D631 Anemia in chronic kidney disease: Secondary | ICD-10-CM | POA: Diagnosis not present

## 2020-11-29 DIAGNOSIS — D509 Iron deficiency anemia, unspecified: Secondary | ICD-10-CM | POA: Diagnosis not present

## 2020-11-30 DIAGNOSIS — Z992 Dependence on renal dialysis: Secondary | ICD-10-CM | POA: Diagnosis not present

## 2020-11-30 DIAGNOSIS — N186 End stage renal disease: Secondary | ICD-10-CM | POA: Diagnosis not present

## 2020-11-30 DIAGNOSIS — Z09 Encounter for follow-up examination after completed treatment for conditions other than malignant neoplasm: Secondary | ICD-10-CM | POA: Diagnosis not present

## 2020-12-01 DIAGNOSIS — Z992 Dependence on renal dialysis: Secondary | ICD-10-CM | POA: Diagnosis not present

## 2020-12-01 DIAGNOSIS — N186 End stage renal disease: Secondary | ICD-10-CM | POA: Diagnosis not present

## 2020-12-01 DIAGNOSIS — D631 Anemia in chronic kidney disease: Secondary | ICD-10-CM | POA: Diagnosis not present

## 2020-12-01 DIAGNOSIS — N2581 Secondary hyperparathyroidism of renal origin: Secondary | ICD-10-CM | POA: Diagnosis not present

## 2020-12-01 DIAGNOSIS — D509 Iron deficiency anemia, unspecified: Secondary | ICD-10-CM | POA: Diagnosis not present

## 2020-12-04 DIAGNOSIS — D631 Anemia in chronic kidney disease: Secondary | ICD-10-CM | POA: Diagnosis not present

## 2020-12-04 DIAGNOSIS — N186 End stage renal disease: Secondary | ICD-10-CM | POA: Diagnosis not present

## 2020-12-04 DIAGNOSIS — Z992 Dependence on renal dialysis: Secondary | ICD-10-CM | POA: Diagnosis not present

## 2020-12-04 DIAGNOSIS — D509 Iron deficiency anemia, unspecified: Secondary | ICD-10-CM | POA: Diagnosis not present

## 2020-12-04 DIAGNOSIS — N2581 Secondary hyperparathyroidism of renal origin: Secondary | ICD-10-CM | POA: Diagnosis not present

## 2020-12-06 DIAGNOSIS — D631 Anemia in chronic kidney disease: Secondary | ICD-10-CM | POA: Diagnosis not present

## 2020-12-06 DIAGNOSIS — Z992 Dependence on renal dialysis: Secondary | ICD-10-CM | POA: Diagnosis not present

## 2020-12-06 DIAGNOSIS — N2581 Secondary hyperparathyroidism of renal origin: Secondary | ICD-10-CM | POA: Diagnosis not present

## 2020-12-06 DIAGNOSIS — N186 End stage renal disease: Secondary | ICD-10-CM | POA: Diagnosis not present

## 2020-12-06 DIAGNOSIS — D509 Iron deficiency anemia, unspecified: Secondary | ICD-10-CM | POA: Diagnosis not present

## 2020-12-08 DIAGNOSIS — N2581 Secondary hyperparathyroidism of renal origin: Secondary | ICD-10-CM | POA: Diagnosis not present

## 2020-12-08 DIAGNOSIS — D509 Iron deficiency anemia, unspecified: Secondary | ICD-10-CM | POA: Diagnosis not present

## 2020-12-08 DIAGNOSIS — Z992 Dependence on renal dialysis: Secondary | ICD-10-CM | POA: Diagnosis not present

## 2020-12-08 DIAGNOSIS — N186 End stage renal disease: Secondary | ICD-10-CM | POA: Diagnosis not present

## 2020-12-08 DIAGNOSIS — D631 Anemia in chronic kidney disease: Secondary | ICD-10-CM | POA: Diagnosis not present

## 2020-12-11 DIAGNOSIS — Z992 Dependence on renal dialysis: Secondary | ICD-10-CM | POA: Diagnosis not present

## 2020-12-11 DIAGNOSIS — D631 Anemia in chronic kidney disease: Secondary | ICD-10-CM | POA: Diagnosis not present

## 2020-12-11 DIAGNOSIS — N186 End stage renal disease: Secondary | ICD-10-CM | POA: Diagnosis not present

## 2020-12-11 DIAGNOSIS — N2581 Secondary hyperparathyroidism of renal origin: Secondary | ICD-10-CM | POA: Diagnosis not present

## 2020-12-11 DIAGNOSIS — D509 Iron deficiency anemia, unspecified: Secondary | ICD-10-CM | POA: Diagnosis not present

## 2020-12-13 DIAGNOSIS — N2581 Secondary hyperparathyroidism of renal origin: Secondary | ICD-10-CM | POA: Diagnosis not present

## 2020-12-13 DIAGNOSIS — Z992 Dependence on renal dialysis: Secondary | ICD-10-CM | POA: Diagnosis not present

## 2020-12-13 DIAGNOSIS — N186 End stage renal disease: Secondary | ICD-10-CM | POA: Diagnosis not present

## 2020-12-13 DIAGNOSIS — D509 Iron deficiency anemia, unspecified: Secondary | ICD-10-CM | POA: Diagnosis not present

## 2020-12-13 DIAGNOSIS — D631 Anemia in chronic kidney disease: Secondary | ICD-10-CM | POA: Diagnosis not present

## 2020-12-15 DIAGNOSIS — N2581 Secondary hyperparathyroidism of renal origin: Secondary | ICD-10-CM | POA: Diagnosis not present

## 2020-12-15 DIAGNOSIS — D631 Anemia in chronic kidney disease: Secondary | ICD-10-CM | POA: Diagnosis not present

## 2020-12-15 DIAGNOSIS — N186 End stage renal disease: Secondary | ICD-10-CM | POA: Diagnosis not present

## 2020-12-15 DIAGNOSIS — D509 Iron deficiency anemia, unspecified: Secondary | ICD-10-CM | POA: Diagnosis not present

## 2020-12-15 DIAGNOSIS — Z992 Dependence on renal dialysis: Secondary | ICD-10-CM | POA: Diagnosis not present

## 2020-12-18 DIAGNOSIS — Z992 Dependence on renal dialysis: Secondary | ICD-10-CM | POA: Diagnosis not present

## 2020-12-18 DIAGNOSIS — N186 End stage renal disease: Secondary | ICD-10-CM | POA: Diagnosis not present

## 2020-12-18 DIAGNOSIS — D509 Iron deficiency anemia, unspecified: Secondary | ICD-10-CM | POA: Diagnosis not present

## 2020-12-18 DIAGNOSIS — N2581 Secondary hyperparathyroidism of renal origin: Secondary | ICD-10-CM | POA: Diagnosis not present

## 2020-12-18 DIAGNOSIS — D631 Anemia in chronic kidney disease: Secondary | ICD-10-CM | POA: Diagnosis not present

## 2020-12-19 DIAGNOSIS — Z888 Allergy status to other drugs, medicaments and biological substances status: Secondary | ICD-10-CM | POA: Diagnosis not present

## 2020-12-19 DIAGNOSIS — M4312 Spondylolisthesis, cervical region: Secondary | ICD-10-CM | POA: Diagnosis not present

## 2020-12-19 DIAGNOSIS — M4314 Spondylolisthesis, thoracic region: Secondary | ICD-10-CM | POA: Diagnosis not present

## 2020-12-19 DIAGNOSIS — Z881 Allergy status to other antibiotic agents status: Secondary | ICD-10-CM | POA: Diagnosis not present

## 2020-12-19 DIAGNOSIS — Z981 Arthrodesis status: Secondary | ICD-10-CM | POA: Diagnosis not present

## 2020-12-19 DIAGNOSIS — M4313 Spondylolisthesis, cervicothoracic region: Secondary | ICD-10-CM | POA: Diagnosis not present

## 2020-12-20 DIAGNOSIS — N186 End stage renal disease: Secondary | ICD-10-CM | POA: Diagnosis not present

## 2020-12-20 DIAGNOSIS — D509 Iron deficiency anemia, unspecified: Secondary | ICD-10-CM | POA: Diagnosis not present

## 2020-12-20 DIAGNOSIS — Z992 Dependence on renal dialysis: Secondary | ICD-10-CM | POA: Diagnosis not present

## 2020-12-20 DIAGNOSIS — D631 Anemia in chronic kidney disease: Secondary | ICD-10-CM | POA: Diagnosis not present

## 2020-12-20 DIAGNOSIS — N2581 Secondary hyperparathyroidism of renal origin: Secondary | ICD-10-CM | POA: Diagnosis not present

## 2020-12-21 ENCOUNTER — Other Ambulatory Visit (HOSPITAL_COMMUNITY): Payer: Medicare Other

## 2020-12-22 DIAGNOSIS — N2581 Secondary hyperparathyroidism of renal origin: Secondary | ICD-10-CM | POA: Diagnosis not present

## 2020-12-22 DIAGNOSIS — N186 End stage renal disease: Secondary | ICD-10-CM | POA: Diagnosis not present

## 2020-12-22 DIAGNOSIS — Z992 Dependence on renal dialysis: Secondary | ICD-10-CM | POA: Diagnosis not present

## 2020-12-22 DIAGNOSIS — D631 Anemia in chronic kidney disease: Secondary | ICD-10-CM | POA: Diagnosis not present

## 2020-12-22 DIAGNOSIS — D509 Iron deficiency anemia, unspecified: Secondary | ICD-10-CM | POA: Diagnosis not present

## 2020-12-24 ENCOUNTER — Ambulatory Visit: Payer: Medicare Other | Admitting: Pulmonary Disease

## 2020-12-25 DIAGNOSIS — N2581 Secondary hyperparathyroidism of renal origin: Secondary | ICD-10-CM | POA: Diagnosis not present

## 2020-12-25 DIAGNOSIS — D631 Anemia in chronic kidney disease: Secondary | ICD-10-CM | POA: Diagnosis not present

## 2020-12-25 DIAGNOSIS — D509 Iron deficiency anemia, unspecified: Secondary | ICD-10-CM | POA: Diagnosis not present

## 2020-12-25 DIAGNOSIS — N186 End stage renal disease: Secondary | ICD-10-CM | POA: Diagnosis not present

## 2020-12-25 DIAGNOSIS — Z992 Dependence on renal dialysis: Secondary | ICD-10-CM | POA: Diagnosis not present

## 2020-12-27 DIAGNOSIS — Z992 Dependence on renal dialysis: Secondary | ICD-10-CM | POA: Diagnosis not present

## 2020-12-27 DIAGNOSIS — N186 End stage renal disease: Secondary | ICD-10-CM | POA: Diagnosis not present

## 2020-12-27 DIAGNOSIS — D631 Anemia in chronic kidney disease: Secondary | ICD-10-CM | POA: Diagnosis not present

## 2020-12-27 DIAGNOSIS — D509 Iron deficiency anemia, unspecified: Secondary | ICD-10-CM | POA: Diagnosis not present

## 2020-12-27 DIAGNOSIS — N2581 Secondary hyperparathyroidism of renal origin: Secondary | ICD-10-CM | POA: Diagnosis not present

## 2020-12-28 DIAGNOSIS — N186 End stage renal disease: Secondary | ICD-10-CM | POA: Diagnosis not present

## 2020-12-28 DIAGNOSIS — Z992 Dependence on renal dialysis: Secondary | ICD-10-CM | POA: Diagnosis not present

## 2020-12-29 DIAGNOSIS — N186 End stage renal disease: Secondary | ICD-10-CM | POA: Diagnosis not present

## 2020-12-29 DIAGNOSIS — D631 Anemia in chronic kidney disease: Secondary | ICD-10-CM | POA: Diagnosis not present

## 2020-12-29 DIAGNOSIS — N2581 Secondary hyperparathyroidism of renal origin: Secondary | ICD-10-CM | POA: Diagnosis not present

## 2020-12-29 DIAGNOSIS — Z992 Dependence on renal dialysis: Secondary | ICD-10-CM | POA: Diagnosis not present

## 2020-12-29 DIAGNOSIS — D509 Iron deficiency anemia, unspecified: Secondary | ICD-10-CM | POA: Diagnosis not present

## 2021-01-01 DIAGNOSIS — Z20822 Contact with and (suspected) exposure to covid-19: Secondary | ICD-10-CM | POA: Diagnosis not present

## 2021-01-03 DIAGNOSIS — D509 Iron deficiency anemia, unspecified: Secondary | ICD-10-CM | POA: Diagnosis not present

## 2021-01-03 DIAGNOSIS — Z992 Dependence on renal dialysis: Secondary | ICD-10-CM | POA: Diagnosis not present

## 2021-01-03 DIAGNOSIS — N2581 Secondary hyperparathyroidism of renal origin: Secondary | ICD-10-CM | POA: Diagnosis not present

## 2021-01-03 DIAGNOSIS — D631 Anemia in chronic kidney disease: Secondary | ICD-10-CM | POA: Diagnosis not present

## 2021-01-03 DIAGNOSIS — N186 End stage renal disease: Secondary | ICD-10-CM | POA: Diagnosis not present

## 2021-01-04 DIAGNOSIS — M25562 Pain in left knee: Secondary | ICD-10-CM | POA: Diagnosis not present

## 2021-01-04 DIAGNOSIS — M1712 Unilateral primary osteoarthritis, left knee: Secondary | ICD-10-CM | POA: Diagnosis not present

## 2021-01-05 DIAGNOSIS — Z992 Dependence on renal dialysis: Secondary | ICD-10-CM | POA: Diagnosis not present

## 2021-01-05 DIAGNOSIS — D509 Iron deficiency anemia, unspecified: Secondary | ICD-10-CM | POA: Diagnosis not present

## 2021-01-05 DIAGNOSIS — N2581 Secondary hyperparathyroidism of renal origin: Secondary | ICD-10-CM | POA: Diagnosis not present

## 2021-01-05 DIAGNOSIS — D631 Anemia in chronic kidney disease: Secondary | ICD-10-CM | POA: Diagnosis not present

## 2021-01-05 DIAGNOSIS — N186 End stage renal disease: Secondary | ICD-10-CM | POA: Diagnosis not present

## 2021-01-08 DIAGNOSIS — D509 Iron deficiency anemia, unspecified: Secondary | ICD-10-CM | POA: Diagnosis not present

## 2021-01-08 DIAGNOSIS — N2581 Secondary hyperparathyroidism of renal origin: Secondary | ICD-10-CM | POA: Diagnosis not present

## 2021-01-08 DIAGNOSIS — N186 End stage renal disease: Secondary | ICD-10-CM | POA: Diagnosis not present

## 2021-01-08 DIAGNOSIS — Z992 Dependence on renal dialysis: Secondary | ICD-10-CM | POA: Diagnosis not present

## 2021-01-08 DIAGNOSIS — D631 Anemia in chronic kidney disease: Secondary | ICD-10-CM | POA: Diagnosis not present

## 2021-01-10 DIAGNOSIS — Z4789 Encounter for other orthopedic aftercare: Secondary | ICD-10-CM | POA: Diagnosis not present

## 2021-01-10 DIAGNOSIS — N2581 Secondary hyperparathyroidism of renal origin: Secondary | ICD-10-CM | POA: Diagnosis not present

## 2021-01-10 DIAGNOSIS — D509 Iron deficiency anemia, unspecified: Secondary | ICD-10-CM | POA: Diagnosis not present

## 2021-01-10 DIAGNOSIS — N186 End stage renal disease: Secondary | ICD-10-CM | POA: Diagnosis not present

## 2021-01-10 DIAGNOSIS — Z9889 Other specified postprocedural states: Secondary | ICD-10-CM | POA: Diagnosis not present

## 2021-01-10 DIAGNOSIS — D631 Anemia in chronic kidney disease: Secondary | ICD-10-CM | POA: Diagnosis not present

## 2021-01-10 DIAGNOSIS — Z992 Dependence on renal dialysis: Secondary | ICD-10-CM | POA: Diagnosis not present

## 2021-01-12 DIAGNOSIS — N2581 Secondary hyperparathyroidism of renal origin: Secondary | ICD-10-CM | POA: Diagnosis not present

## 2021-01-12 DIAGNOSIS — Z992 Dependence on renal dialysis: Secondary | ICD-10-CM | POA: Diagnosis not present

## 2021-01-12 DIAGNOSIS — D509 Iron deficiency anemia, unspecified: Secondary | ICD-10-CM | POA: Diagnosis not present

## 2021-01-12 DIAGNOSIS — D631 Anemia in chronic kidney disease: Secondary | ICD-10-CM | POA: Diagnosis not present

## 2021-01-12 DIAGNOSIS — N186 End stage renal disease: Secondary | ICD-10-CM | POA: Diagnosis not present

## 2021-01-15 DIAGNOSIS — Z86711 Personal history of pulmonary embolism: Secondary | ICD-10-CM | POA: Diagnosis not present

## 2021-01-15 DIAGNOSIS — K219 Gastro-esophageal reflux disease without esophagitis: Secondary | ICD-10-CM | POA: Diagnosis not present

## 2021-01-15 DIAGNOSIS — Z6841 Body Mass Index (BMI) 40.0 and over, adult: Secondary | ICD-10-CM | POA: Diagnosis not present

## 2021-01-15 DIAGNOSIS — Z992 Dependence on renal dialysis: Secondary | ICD-10-CM | POA: Diagnosis not present

## 2021-01-15 DIAGNOSIS — Z7982 Long term (current) use of aspirin: Secondary | ICD-10-CM | POA: Diagnosis not present

## 2021-01-15 DIAGNOSIS — D631 Anemia in chronic kidney disease: Secondary | ICD-10-CM | POA: Diagnosis not present

## 2021-01-15 DIAGNOSIS — I120 Hypertensive chronic kidney disease with stage 5 chronic kidney disease or end stage renal disease: Secondary | ICD-10-CM | POA: Diagnosis not present

## 2021-01-15 DIAGNOSIS — R778 Other specified abnormalities of plasma proteins: Secondary | ICD-10-CM | POA: Diagnosis not present

## 2021-01-15 DIAGNOSIS — I132 Hypertensive heart and chronic kidney disease with heart failure and with stage 5 chronic kidney disease, or end stage renal disease: Secondary | ICD-10-CM | POA: Diagnosis not present

## 2021-01-15 DIAGNOSIS — E669 Obesity, unspecified: Secondary | ICD-10-CM | POA: Diagnosis not present

## 2021-01-15 DIAGNOSIS — R079 Chest pain, unspecified: Secondary | ICD-10-CM | POA: Diagnosis not present

## 2021-01-15 DIAGNOSIS — Z79899 Other long term (current) drug therapy: Secondary | ICD-10-CM | POA: Diagnosis not present

## 2021-01-15 DIAGNOSIS — Z9981 Dependence on supplemental oxygen: Secondary | ICD-10-CM | POA: Diagnosis not present

## 2021-01-15 DIAGNOSIS — G2581 Restless legs syndrome: Secondary | ICD-10-CM | POA: Diagnosis not present

## 2021-01-15 DIAGNOSIS — I160 Hypertensive urgency: Secondary | ICD-10-CM | POA: Diagnosis not present

## 2021-01-15 DIAGNOSIS — I509 Heart failure, unspecified: Secondary | ICD-10-CM | POA: Diagnosis not present

## 2021-01-15 DIAGNOSIS — N186 End stage renal disease: Secondary | ICD-10-CM | POA: Diagnosis not present

## 2021-01-15 DIAGNOSIS — I517 Cardiomegaly: Secondary | ICD-10-CM | POA: Diagnosis not present

## 2021-01-15 DIAGNOSIS — M1712 Unilateral primary osteoarthritis, left knee: Secondary | ICD-10-CM | POA: Diagnosis not present

## 2021-01-15 DIAGNOSIS — M1A9XX0 Chronic gout, unspecified, without tophus (tophi): Secondary | ICD-10-CM | POA: Diagnosis not present

## 2021-01-17 DIAGNOSIS — Z992 Dependence on renal dialysis: Secondary | ICD-10-CM | POA: Diagnosis not present

## 2021-01-17 DIAGNOSIS — D509 Iron deficiency anemia, unspecified: Secondary | ICD-10-CM | POA: Diagnosis not present

## 2021-01-17 DIAGNOSIS — N186 End stage renal disease: Secondary | ICD-10-CM | POA: Diagnosis not present

## 2021-01-17 DIAGNOSIS — N2581 Secondary hyperparathyroidism of renal origin: Secondary | ICD-10-CM | POA: Diagnosis not present

## 2021-01-17 DIAGNOSIS — D631 Anemia in chronic kidney disease: Secondary | ICD-10-CM | POA: Diagnosis not present

## 2021-01-19 DIAGNOSIS — D631 Anemia in chronic kidney disease: Secondary | ICD-10-CM | POA: Diagnosis not present

## 2021-01-19 DIAGNOSIS — Z992 Dependence on renal dialysis: Secondary | ICD-10-CM | POA: Diagnosis not present

## 2021-01-19 DIAGNOSIS — N2581 Secondary hyperparathyroidism of renal origin: Secondary | ICD-10-CM | POA: Diagnosis not present

## 2021-01-19 DIAGNOSIS — N186 End stage renal disease: Secondary | ICD-10-CM | POA: Diagnosis not present

## 2021-01-19 DIAGNOSIS — D509 Iron deficiency anemia, unspecified: Secondary | ICD-10-CM | POA: Diagnosis not present

## 2021-01-22 DIAGNOSIS — N186 End stage renal disease: Secondary | ICD-10-CM | POA: Diagnosis not present

## 2021-01-22 DIAGNOSIS — N2581 Secondary hyperparathyroidism of renal origin: Secondary | ICD-10-CM | POA: Diagnosis not present

## 2021-01-22 DIAGNOSIS — D631 Anemia in chronic kidney disease: Secondary | ICD-10-CM | POA: Diagnosis not present

## 2021-01-22 DIAGNOSIS — Z992 Dependence on renal dialysis: Secondary | ICD-10-CM | POA: Diagnosis not present

## 2021-01-22 DIAGNOSIS — D509 Iron deficiency anemia, unspecified: Secondary | ICD-10-CM | POA: Diagnosis not present

## 2021-01-24 DIAGNOSIS — N2581 Secondary hyperparathyroidism of renal origin: Secondary | ICD-10-CM | POA: Diagnosis not present

## 2021-01-24 DIAGNOSIS — N186 End stage renal disease: Secondary | ICD-10-CM | POA: Diagnosis not present

## 2021-01-24 DIAGNOSIS — D509 Iron deficiency anemia, unspecified: Secondary | ICD-10-CM | POA: Diagnosis not present

## 2021-01-24 DIAGNOSIS — Z992 Dependence on renal dialysis: Secondary | ICD-10-CM | POA: Diagnosis not present

## 2021-01-24 DIAGNOSIS — D631 Anemia in chronic kidney disease: Secondary | ICD-10-CM | POA: Diagnosis not present

## 2021-01-26 DIAGNOSIS — D631 Anemia in chronic kidney disease: Secondary | ICD-10-CM | POA: Diagnosis not present

## 2021-01-26 DIAGNOSIS — N186 End stage renal disease: Secondary | ICD-10-CM | POA: Diagnosis not present

## 2021-01-26 DIAGNOSIS — N2581 Secondary hyperparathyroidism of renal origin: Secondary | ICD-10-CM | POA: Diagnosis not present

## 2021-01-26 DIAGNOSIS — Z992 Dependence on renal dialysis: Secondary | ICD-10-CM | POA: Diagnosis not present

## 2021-01-26 DIAGNOSIS — D509 Iron deficiency anemia, unspecified: Secondary | ICD-10-CM | POA: Diagnosis not present

## 2021-01-28 DIAGNOSIS — Z992 Dependence on renal dialysis: Secondary | ICD-10-CM | POA: Diagnosis not present

## 2021-01-28 DIAGNOSIS — N186 End stage renal disease: Secondary | ICD-10-CM | POA: Diagnosis not present

## 2021-01-29 DIAGNOSIS — D631 Anemia in chronic kidney disease: Secondary | ICD-10-CM | POA: Diagnosis not present

## 2021-01-29 DIAGNOSIS — N186 End stage renal disease: Secondary | ICD-10-CM | POA: Diagnosis not present

## 2021-01-29 DIAGNOSIS — D509 Iron deficiency anemia, unspecified: Secondary | ICD-10-CM | POA: Diagnosis not present

## 2021-01-29 DIAGNOSIS — N2581 Secondary hyperparathyroidism of renal origin: Secondary | ICD-10-CM | POA: Diagnosis not present

## 2021-01-29 DIAGNOSIS — Z992 Dependence on renal dialysis: Secondary | ICD-10-CM | POA: Diagnosis not present

## 2021-01-31 DIAGNOSIS — N186 End stage renal disease: Secondary | ICD-10-CM | POA: Diagnosis not present

## 2021-01-31 DIAGNOSIS — N2581 Secondary hyperparathyroidism of renal origin: Secondary | ICD-10-CM | POA: Diagnosis not present

## 2021-01-31 DIAGNOSIS — D631 Anemia in chronic kidney disease: Secondary | ICD-10-CM | POA: Diagnosis not present

## 2021-01-31 DIAGNOSIS — D509 Iron deficiency anemia, unspecified: Secondary | ICD-10-CM | POA: Diagnosis not present

## 2021-01-31 DIAGNOSIS — Z992 Dependence on renal dialysis: Secondary | ICD-10-CM | POA: Diagnosis not present

## 2021-02-02 DIAGNOSIS — D631 Anemia in chronic kidney disease: Secondary | ICD-10-CM | POA: Diagnosis not present

## 2021-02-02 DIAGNOSIS — D509 Iron deficiency anemia, unspecified: Secondary | ICD-10-CM | POA: Diagnosis not present

## 2021-02-02 DIAGNOSIS — N2581 Secondary hyperparathyroidism of renal origin: Secondary | ICD-10-CM | POA: Diagnosis not present

## 2021-02-02 DIAGNOSIS — N186 End stage renal disease: Secondary | ICD-10-CM | POA: Diagnosis not present

## 2021-02-02 DIAGNOSIS — Z992 Dependence on renal dialysis: Secondary | ICD-10-CM | POA: Diagnosis not present

## 2021-02-05 DIAGNOSIS — D509 Iron deficiency anemia, unspecified: Secondary | ICD-10-CM | POA: Diagnosis not present

## 2021-02-05 DIAGNOSIS — D631 Anemia in chronic kidney disease: Secondary | ICD-10-CM | POA: Diagnosis not present

## 2021-02-05 DIAGNOSIS — N2581 Secondary hyperparathyroidism of renal origin: Secondary | ICD-10-CM | POA: Diagnosis not present

## 2021-02-05 DIAGNOSIS — N186 End stage renal disease: Secondary | ICD-10-CM | POA: Diagnosis not present

## 2021-02-05 DIAGNOSIS — Z992 Dependence on renal dialysis: Secondary | ICD-10-CM | POA: Diagnosis not present

## 2021-02-07 DIAGNOSIS — N2581 Secondary hyperparathyroidism of renal origin: Secondary | ICD-10-CM | POA: Diagnosis not present

## 2021-02-07 DIAGNOSIS — N186 End stage renal disease: Secondary | ICD-10-CM | POA: Diagnosis not present

## 2021-02-07 DIAGNOSIS — D509 Iron deficiency anemia, unspecified: Secondary | ICD-10-CM | POA: Diagnosis not present

## 2021-02-07 DIAGNOSIS — D631 Anemia in chronic kidney disease: Secondary | ICD-10-CM | POA: Diagnosis not present

## 2021-02-07 DIAGNOSIS — Z992 Dependence on renal dialysis: Secondary | ICD-10-CM | POA: Diagnosis not present

## 2021-02-09 DIAGNOSIS — N2581 Secondary hyperparathyroidism of renal origin: Secondary | ICD-10-CM | POA: Diagnosis not present

## 2021-02-09 DIAGNOSIS — Z992 Dependence on renal dialysis: Secondary | ICD-10-CM | POA: Diagnosis not present

## 2021-02-09 DIAGNOSIS — D509 Iron deficiency anemia, unspecified: Secondary | ICD-10-CM | POA: Diagnosis not present

## 2021-02-09 DIAGNOSIS — N186 End stage renal disease: Secondary | ICD-10-CM | POA: Diagnosis not present

## 2021-02-09 DIAGNOSIS — D631 Anemia in chronic kidney disease: Secondary | ICD-10-CM | POA: Diagnosis not present

## 2021-02-14 DIAGNOSIS — N186 End stage renal disease: Secondary | ICD-10-CM | POA: Diagnosis not present

## 2021-02-14 DIAGNOSIS — Z992 Dependence on renal dialysis: Secondary | ICD-10-CM | POA: Diagnosis not present

## 2021-02-14 DIAGNOSIS — D509 Iron deficiency anemia, unspecified: Secondary | ICD-10-CM | POA: Diagnosis not present

## 2021-02-14 DIAGNOSIS — D631 Anemia in chronic kidney disease: Secondary | ICD-10-CM | POA: Diagnosis not present

## 2021-02-14 DIAGNOSIS — N2581 Secondary hyperparathyroidism of renal origin: Secondary | ICD-10-CM | POA: Diagnosis not present

## 2021-02-16 DIAGNOSIS — N2581 Secondary hyperparathyroidism of renal origin: Secondary | ICD-10-CM | POA: Diagnosis not present

## 2021-02-16 DIAGNOSIS — D509 Iron deficiency anemia, unspecified: Secondary | ICD-10-CM | POA: Diagnosis not present

## 2021-02-16 DIAGNOSIS — Z992 Dependence on renal dialysis: Secondary | ICD-10-CM | POA: Diagnosis not present

## 2021-02-16 DIAGNOSIS — N186 End stage renal disease: Secondary | ICD-10-CM | POA: Diagnosis not present

## 2021-02-16 DIAGNOSIS — D631 Anemia in chronic kidney disease: Secondary | ICD-10-CM | POA: Diagnosis not present

## 2021-02-19 DIAGNOSIS — N186 End stage renal disease: Secondary | ICD-10-CM | POA: Diagnosis not present

## 2021-02-19 DIAGNOSIS — N2581 Secondary hyperparathyroidism of renal origin: Secondary | ICD-10-CM | POA: Diagnosis not present

## 2021-02-19 DIAGNOSIS — Z992 Dependence on renal dialysis: Secondary | ICD-10-CM | POA: Diagnosis not present

## 2021-02-19 DIAGNOSIS — D509 Iron deficiency anemia, unspecified: Secondary | ICD-10-CM | POA: Diagnosis not present

## 2021-02-19 DIAGNOSIS — D631 Anemia in chronic kidney disease: Secondary | ICD-10-CM | POA: Diagnosis not present

## 2021-02-21 DIAGNOSIS — D509 Iron deficiency anemia, unspecified: Secondary | ICD-10-CM | POA: Diagnosis not present

## 2021-02-21 DIAGNOSIS — N186 End stage renal disease: Secondary | ICD-10-CM | POA: Diagnosis not present

## 2021-02-21 DIAGNOSIS — D631 Anemia in chronic kidney disease: Secondary | ICD-10-CM | POA: Diagnosis not present

## 2021-02-21 DIAGNOSIS — N2581 Secondary hyperparathyroidism of renal origin: Secondary | ICD-10-CM | POA: Diagnosis not present

## 2021-02-21 DIAGNOSIS — Z992 Dependence on renal dialysis: Secondary | ICD-10-CM | POA: Diagnosis not present

## 2021-02-23 DIAGNOSIS — D509 Iron deficiency anemia, unspecified: Secondary | ICD-10-CM | POA: Diagnosis not present

## 2021-02-23 DIAGNOSIS — D631 Anemia in chronic kidney disease: Secondary | ICD-10-CM | POA: Diagnosis not present

## 2021-02-23 DIAGNOSIS — N2581 Secondary hyperparathyroidism of renal origin: Secondary | ICD-10-CM | POA: Diagnosis not present

## 2021-02-23 DIAGNOSIS — Z992 Dependence on renal dialysis: Secondary | ICD-10-CM | POA: Diagnosis not present

## 2021-02-23 DIAGNOSIS — N186 End stage renal disease: Secondary | ICD-10-CM | POA: Diagnosis not present

## 2021-02-26 DIAGNOSIS — D631 Anemia in chronic kidney disease: Secondary | ICD-10-CM | POA: Diagnosis not present

## 2021-02-26 DIAGNOSIS — N186 End stage renal disease: Secondary | ICD-10-CM | POA: Diagnosis not present

## 2021-02-26 DIAGNOSIS — Z992 Dependence on renal dialysis: Secondary | ICD-10-CM | POA: Diagnosis not present

## 2021-02-26 DIAGNOSIS — N2581 Secondary hyperparathyroidism of renal origin: Secondary | ICD-10-CM | POA: Diagnosis not present

## 2021-02-26 DIAGNOSIS — D509 Iron deficiency anemia, unspecified: Secondary | ICD-10-CM | POA: Diagnosis not present

## 2021-02-28 DIAGNOSIS — D631 Anemia in chronic kidney disease: Secondary | ICD-10-CM | POA: Diagnosis not present

## 2021-02-28 DIAGNOSIS — N186 End stage renal disease: Secondary | ICD-10-CM | POA: Diagnosis not present

## 2021-02-28 DIAGNOSIS — Z9889 Other specified postprocedural states: Secondary | ICD-10-CM | POA: Diagnosis not present

## 2021-02-28 DIAGNOSIS — N2581 Secondary hyperparathyroidism of renal origin: Secondary | ICD-10-CM | POA: Diagnosis not present

## 2021-02-28 DIAGNOSIS — M4312 Spondylolisthesis, cervical region: Secondary | ICD-10-CM | POA: Diagnosis not present

## 2021-02-28 DIAGNOSIS — D509 Iron deficiency anemia, unspecified: Secondary | ICD-10-CM | POA: Diagnosis not present

## 2021-02-28 DIAGNOSIS — Z981 Arthrodesis status: Secondary | ICD-10-CM | POA: Diagnosis not present

## 2021-02-28 DIAGNOSIS — Z992 Dependence on renal dialysis: Secondary | ICD-10-CM | POA: Diagnosis not present

## 2021-03-02 DIAGNOSIS — N2581 Secondary hyperparathyroidism of renal origin: Secondary | ICD-10-CM | POA: Diagnosis not present

## 2021-03-02 DIAGNOSIS — Z992 Dependence on renal dialysis: Secondary | ICD-10-CM | POA: Diagnosis not present

## 2021-03-02 DIAGNOSIS — N186 End stage renal disease: Secondary | ICD-10-CM | POA: Diagnosis not present

## 2021-03-02 DIAGNOSIS — D509 Iron deficiency anemia, unspecified: Secondary | ICD-10-CM | POA: Diagnosis not present

## 2021-03-02 DIAGNOSIS — D631 Anemia in chronic kidney disease: Secondary | ICD-10-CM | POA: Diagnosis not present

## 2021-03-04 DIAGNOSIS — Z20822 Contact with and (suspected) exposure to covid-19: Secondary | ICD-10-CM | POA: Diagnosis not present

## 2021-03-05 DIAGNOSIS — D631 Anemia in chronic kidney disease: Secondary | ICD-10-CM | POA: Diagnosis not present

## 2021-03-05 DIAGNOSIS — D509 Iron deficiency anemia, unspecified: Secondary | ICD-10-CM | POA: Diagnosis not present

## 2021-03-05 DIAGNOSIS — N2581 Secondary hyperparathyroidism of renal origin: Secondary | ICD-10-CM | POA: Diagnosis not present

## 2021-03-05 DIAGNOSIS — N186 End stage renal disease: Secondary | ICD-10-CM | POA: Diagnosis not present

## 2021-03-05 DIAGNOSIS — Z992 Dependence on renal dialysis: Secondary | ICD-10-CM | POA: Diagnosis not present

## 2021-03-09 DIAGNOSIS — N2581 Secondary hyperparathyroidism of renal origin: Secondary | ICD-10-CM | POA: Diagnosis not present

## 2021-03-09 DIAGNOSIS — N186 End stage renal disease: Secondary | ICD-10-CM | POA: Diagnosis not present

## 2021-03-09 DIAGNOSIS — Z992 Dependence on renal dialysis: Secondary | ICD-10-CM | POA: Diagnosis not present

## 2021-03-09 DIAGNOSIS — D509 Iron deficiency anemia, unspecified: Secondary | ICD-10-CM | POA: Diagnosis not present

## 2021-03-09 DIAGNOSIS — D631 Anemia in chronic kidney disease: Secondary | ICD-10-CM | POA: Diagnosis not present

## 2021-03-11 DIAGNOSIS — S80912A Unspecified superficial injury of left knee, initial encounter: Secondary | ICD-10-CM | POA: Diagnosis not present

## 2021-03-11 DIAGNOSIS — M25562 Pain in left knee: Secondary | ICD-10-CM | POA: Diagnosis not present

## 2021-03-11 DIAGNOSIS — M1712 Unilateral primary osteoarthritis, left knee: Secondary | ICD-10-CM | POA: Diagnosis not present

## 2021-03-11 DIAGNOSIS — M25462 Effusion, left knee: Secondary | ICD-10-CM | POA: Diagnosis not present

## 2021-03-12 DIAGNOSIS — Z992 Dependence on renal dialysis: Secondary | ICD-10-CM | POA: Diagnosis not present

## 2021-03-12 DIAGNOSIS — N2581 Secondary hyperparathyroidism of renal origin: Secondary | ICD-10-CM | POA: Diagnosis not present

## 2021-03-12 DIAGNOSIS — N186 End stage renal disease: Secondary | ICD-10-CM | POA: Diagnosis not present

## 2021-03-12 DIAGNOSIS — D509 Iron deficiency anemia, unspecified: Secondary | ICD-10-CM | POA: Diagnosis not present

## 2021-03-12 DIAGNOSIS — D631 Anemia in chronic kidney disease: Secondary | ICD-10-CM | POA: Diagnosis not present

## 2021-03-14 DIAGNOSIS — D631 Anemia in chronic kidney disease: Secondary | ICD-10-CM | POA: Diagnosis not present

## 2021-03-14 DIAGNOSIS — N186 End stage renal disease: Secondary | ICD-10-CM | POA: Diagnosis not present

## 2021-03-14 DIAGNOSIS — Z992 Dependence on renal dialysis: Secondary | ICD-10-CM | POA: Diagnosis not present

## 2021-03-14 DIAGNOSIS — D509 Iron deficiency anemia, unspecified: Secondary | ICD-10-CM | POA: Diagnosis not present

## 2021-03-14 DIAGNOSIS — N2581 Secondary hyperparathyroidism of renal origin: Secondary | ICD-10-CM | POA: Diagnosis not present

## 2021-03-16 DIAGNOSIS — N186 End stage renal disease: Secondary | ICD-10-CM | POA: Diagnosis not present

## 2021-03-16 DIAGNOSIS — Z992 Dependence on renal dialysis: Secondary | ICD-10-CM | POA: Diagnosis not present

## 2021-03-16 DIAGNOSIS — D631 Anemia in chronic kidney disease: Secondary | ICD-10-CM | POA: Diagnosis not present

## 2021-03-16 DIAGNOSIS — N2581 Secondary hyperparathyroidism of renal origin: Secondary | ICD-10-CM | POA: Diagnosis not present

## 2021-03-16 DIAGNOSIS — D509 Iron deficiency anemia, unspecified: Secondary | ICD-10-CM | POA: Diagnosis not present

## 2021-03-19 DIAGNOSIS — D509 Iron deficiency anemia, unspecified: Secondary | ICD-10-CM | POA: Diagnosis not present

## 2021-03-19 DIAGNOSIS — Z992 Dependence on renal dialysis: Secondary | ICD-10-CM | POA: Diagnosis not present

## 2021-03-19 DIAGNOSIS — N2581 Secondary hyperparathyroidism of renal origin: Secondary | ICD-10-CM | POA: Diagnosis not present

## 2021-03-19 DIAGNOSIS — N186 End stage renal disease: Secondary | ICD-10-CM | POA: Diagnosis not present

## 2021-03-19 DIAGNOSIS — D631 Anemia in chronic kidney disease: Secondary | ICD-10-CM | POA: Diagnosis not present

## 2021-03-20 DIAGNOSIS — M25569 Pain in unspecified knee: Secondary | ICD-10-CM | POA: Diagnosis not present

## 2021-03-20 DIAGNOSIS — Z6841 Body Mass Index (BMI) 40.0 and over, adult: Secondary | ICD-10-CM | POA: Diagnosis not present

## 2021-03-20 DIAGNOSIS — M62838 Other muscle spasm: Secondary | ICD-10-CM | POA: Diagnosis not present

## 2021-03-21 DIAGNOSIS — Z992 Dependence on renal dialysis: Secondary | ICD-10-CM | POA: Diagnosis not present

## 2021-03-21 DIAGNOSIS — N2581 Secondary hyperparathyroidism of renal origin: Secondary | ICD-10-CM | POA: Diagnosis not present

## 2021-03-21 DIAGNOSIS — D631 Anemia in chronic kidney disease: Secondary | ICD-10-CM | POA: Diagnosis not present

## 2021-03-21 DIAGNOSIS — N186 End stage renal disease: Secondary | ICD-10-CM | POA: Diagnosis not present

## 2021-03-21 DIAGNOSIS — D509 Iron deficiency anemia, unspecified: Secondary | ICD-10-CM | POA: Diagnosis not present

## 2021-03-23 DIAGNOSIS — D631 Anemia in chronic kidney disease: Secondary | ICD-10-CM | POA: Diagnosis not present

## 2021-03-23 DIAGNOSIS — N186 End stage renal disease: Secondary | ICD-10-CM | POA: Diagnosis not present

## 2021-03-23 DIAGNOSIS — N2581 Secondary hyperparathyroidism of renal origin: Secondary | ICD-10-CM | POA: Diagnosis not present

## 2021-03-23 DIAGNOSIS — Z992 Dependence on renal dialysis: Secondary | ICD-10-CM | POA: Diagnosis not present

## 2021-03-23 DIAGNOSIS — D509 Iron deficiency anemia, unspecified: Secondary | ICD-10-CM | POA: Diagnosis not present

## 2021-03-26 DIAGNOSIS — I509 Heart failure, unspecified: Secondary | ICD-10-CM | POA: Diagnosis not present

## 2021-03-26 DIAGNOSIS — R519 Headache, unspecified: Secondary | ICD-10-CM | POA: Diagnosis not present

## 2021-03-26 DIAGNOSIS — M1A9XX0 Chronic gout, unspecified, without tophus (tophi): Secondary | ICD-10-CM | POA: Diagnosis not present

## 2021-03-26 DIAGNOSIS — Z8701 Personal history of pneumonia (recurrent): Secondary | ICD-10-CM | POA: Diagnosis not present

## 2021-03-26 DIAGNOSIS — M1712 Unilateral primary osteoarthritis, left knee: Secondary | ICD-10-CM | POA: Diagnosis not present

## 2021-03-26 DIAGNOSIS — Z86711 Personal history of pulmonary embolism: Secondary | ICD-10-CM | POA: Diagnosis not present

## 2021-03-26 DIAGNOSIS — G2581 Restless legs syndrome: Secondary | ICD-10-CM | POA: Diagnosis not present

## 2021-03-26 DIAGNOSIS — J8410 Pulmonary fibrosis, unspecified: Secondary | ICD-10-CM | POA: Diagnosis not present

## 2021-03-26 DIAGNOSIS — R0602 Shortness of breath: Secondary | ICD-10-CM | POA: Diagnosis not present

## 2021-03-26 DIAGNOSIS — I132 Hypertensive heart and chronic kidney disease with heart failure and with stage 5 chronic kidney disease, or end stage renal disease: Secondary | ICD-10-CM | POA: Diagnosis not present

## 2021-03-26 DIAGNOSIS — I161 Hypertensive emergency: Secondary | ICD-10-CM | POA: Diagnosis not present

## 2021-03-26 DIAGNOSIS — K219 Gastro-esophageal reflux disease without esophagitis: Secondary | ICD-10-CM | POA: Diagnosis not present

## 2021-03-26 DIAGNOSIS — J811 Chronic pulmonary edema: Secondary | ICD-10-CM | POA: Diagnosis not present

## 2021-03-26 DIAGNOSIS — Z992 Dependence on renal dialysis: Secondary | ICD-10-CM | POA: Diagnosis not present

## 2021-03-26 DIAGNOSIS — I5021 Acute systolic (congestive) heart failure: Secondary | ICD-10-CM | POA: Diagnosis not present

## 2021-03-26 DIAGNOSIS — E875 Hyperkalemia: Secondary | ICD-10-CM | POA: Diagnosis not present

## 2021-03-26 DIAGNOSIS — N186 End stage renal disease: Secondary | ICD-10-CM | POA: Diagnosis not present

## 2021-03-26 DIAGNOSIS — Z7982 Long term (current) use of aspirin: Secondary | ICD-10-CM | POA: Diagnosis not present

## 2021-03-26 DIAGNOSIS — R778 Other specified abnormalities of plasma proteins: Secondary | ICD-10-CM | POA: Diagnosis not present

## 2021-03-26 DIAGNOSIS — I160 Hypertensive urgency: Secondary | ICD-10-CM | POA: Diagnosis not present

## 2021-03-26 DIAGNOSIS — Z9981 Dependence on supplemental oxygen: Secondary | ICD-10-CM | POA: Diagnosis not present

## 2021-03-28 DIAGNOSIS — Z992 Dependence on renal dialysis: Secondary | ICD-10-CM | POA: Diagnosis not present

## 2021-03-28 DIAGNOSIS — N2581 Secondary hyperparathyroidism of renal origin: Secondary | ICD-10-CM | POA: Diagnosis not present

## 2021-03-28 DIAGNOSIS — N186 End stage renal disease: Secondary | ICD-10-CM | POA: Diagnosis not present

## 2021-03-28 DIAGNOSIS — D631 Anemia in chronic kidney disease: Secondary | ICD-10-CM | POA: Diagnosis not present

## 2021-03-28 DIAGNOSIS — D509 Iron deficiency anemia, unspecified: Secondary | ICD-10-CM | POA: Diagnosis not present

## 2021-03-30 DIAGNOSIS — Z992 Dependence on renal dialysis: Secondary | ICD-10-CM | POA: Diagnosis not present

## 2021-03-30 DIAGNOSIS — N186 End stage renal disease: Secondary | ICD-10-CM | POA: Diagnosis not present

## 2021-04-02 DIAGNOSIS — D509 Iron deficiency anemia, unspecified: Secondary | ICD-10-CM | POA: Diagnosis not present

## 2021-04-02 DIAGNOSIS — D631 Anemia in chronic kidney disease: Secondary | ICD-10-CM | POA: Diagnosis not present

## 2021-04-02 DIAGNOSIS — Z992 Dependence on renal dialysis: Secondary | ICD-10-CM | POA: Diagnosis not present

## 2021-04-02 DIAGNOSIS — N2581 Secondary hyperparathyroidism of renal origin: Secondary | ICD-10-CM | POA: Diagnosis not present

## 2021-04-02 DIAGNOSIS — N186 End stage renal disease: Secondary | ICD-10-CM | POA: Diagnosis not present

## 2021-04-03 DIAGNOSIS — Z992 Dependence on renal dialysis: Secondary | ICD-10-CM | POA: Diagnosis not present

## 2021-04-03 DIAGNOSIS — N186 End stage renal disease: Secondary | ICD-10-CM | POA: Diagnosis not present

## 2021-04-03 DIAGNOSIS — T82590A Other mechanical complication of surgically created arteriovenous fistula, initial encounter: Secondary | ICD-10-CM | POA: Diagnosis not present

## 2021-04-03 DIAGNOSIS — I120 Hypertensive chronic kidney disease with stage 5 chronic kidney disease or end stage renal disease: Secondary | ICD-10-CM | POA: Diagnosis not present

## 2021-04-04 DIAGNOSIS — D509 Iron deficiency anemia, unspecified: Secondary | ICD-10-CM | POA: Diagnosis not present

## 2021-04-04 DIAGNOSIS — N2581 Secondary hyperparathyroidism of renal origin: Secondary | ICD-10-CM | POA: Diagnosis not present

## 2021-04-04 DIAGNOSIS — Z992 Dependence on renal dialysis: Secondary | ICD-10-CM | POA: Diagnosis not present

## 2021-04-04 DIAGNOSIS — D631 Anemia in chronic kidney disease: Secondary | ICD-10-CM | POA: Diagnosis not present

## 2021-04-04 DIAGNOSIS — N186 End stage renal disease: Secondary | ICD-10-CM | POA: Diagnosis not present

## 2021-04-06 DIAGNOSIS — N2581 Secondary hyperparathyroidism of renal origin: Secondary | ICD-10-CM | POA: Diagnosis not present

## 2021-04-06 DIAGNOSIS — Z992 Dependence on renal dialysis: Secondary | ICD-10-CM | POA: Diagnosis not present

## 2021-04-06 DIAGNOSIS — D631 Anemia in chronic kidney disease: Secondary | ICD-10-CM | POA: Diagnosis not present

## 2021-04-06 DIAGNOSIS — N186 End stage renal disease: Secondary | ICD-10-CM | POA: Diagnosis not present

## 2021-04-06 DIAGNOSIS — D509 Iron deficiency anemia, unspecified: Secondary | ICD-10-CM | POA: Diagnosis not present

## 2021-04-09 DIAGNOSIS — N2581 Secondary hyperparathyroidism of renal origin: Secondary | ICD-10-CM | POA: Diagnosis not present

## 2021-04-09 DIAGNOSIS — Z992 Dependence on renal dialysis: Secondary | ICD-10-CM | POA: Diagnosis not present

## 2021-04-09 DIAGNOSIS — N186 End stage renal disease: Secondary | ICD-10-CM | POA: Diagnosis not present

## 2021-04-09 DIAGNOSIS — D631 Anemia in chronic kidney disease: Secondary | ICD-10-CM | POA: Diagnosis not present

## 2021-04-09 DIAGNOSIS — D509 Iron deficiency anemia, unspecified: Secondary | ICD-10-CM | POA: Diagnosis not present

## 2021-04-10 DIAGNOSIS — M1712 Unilateral primary osteoarthritis, left knee: Secondary | ICD-10-CM | POA: Diagnosis not present

## 2021-04-10 DIAGNOSIS — M25562 Pain in left knee: Secondary | ICD-10-CM | POA: Diagnosis not present

## 2021-04-10 DIAGNOSIS — Z6841 Body Mass Index (BMI) 40.0 and over, adult: Secondary | ICD-10-CM | POA: Diagnosis not present

## 2021-04-13 DIAGNOSIS — Z992 Dependence on renal dialysis: Secondary | ICD-10-CM | POA: Diagnosis not present

## 2021-04-13 DIAGNOSIS — D509 Iron deficiency anemia, unspecified: Secondary | ICD-10-CM | POA: Diagnosis not present

## 2021-04-13 DIAGNOSIS — D631 Anemia in chronic kidney disease: Secondary | ICD-10-CM | POA: Diagnosis not present

## 2021-04-13 DIAGNOSIS — N186 End stage renal disease: Secondary | ICD-10-CM | POA: Diagnosis not present

## 2021-04-13 DIAGNOSIS — N2581 Secondary hyperparathyroidism of renal origin: Secondary | ICD-10-CM | POA: Diagnosis not present

## 2021-04-13 IMAGING — DX DG KNEE COMPLETE 4+V*L*
4 series · 4 of 4 positions shown · non-contrast
Comparison: None.

CLINICAL DATA: Left lower leg pain without known injury.

EXAM:
LEFT KNEE - COMPLETE 4+ VIEW

[x knee ap left]
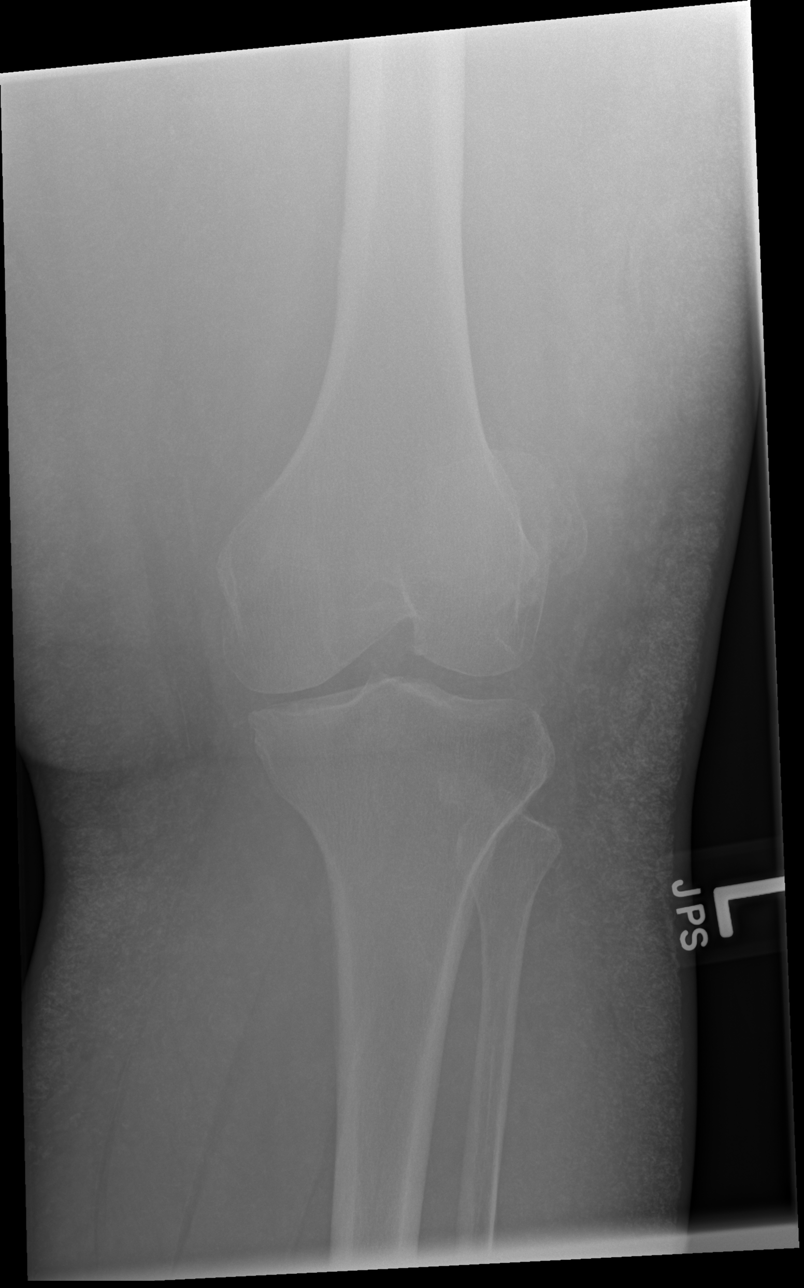

[x knee obl left (1 of 2)]
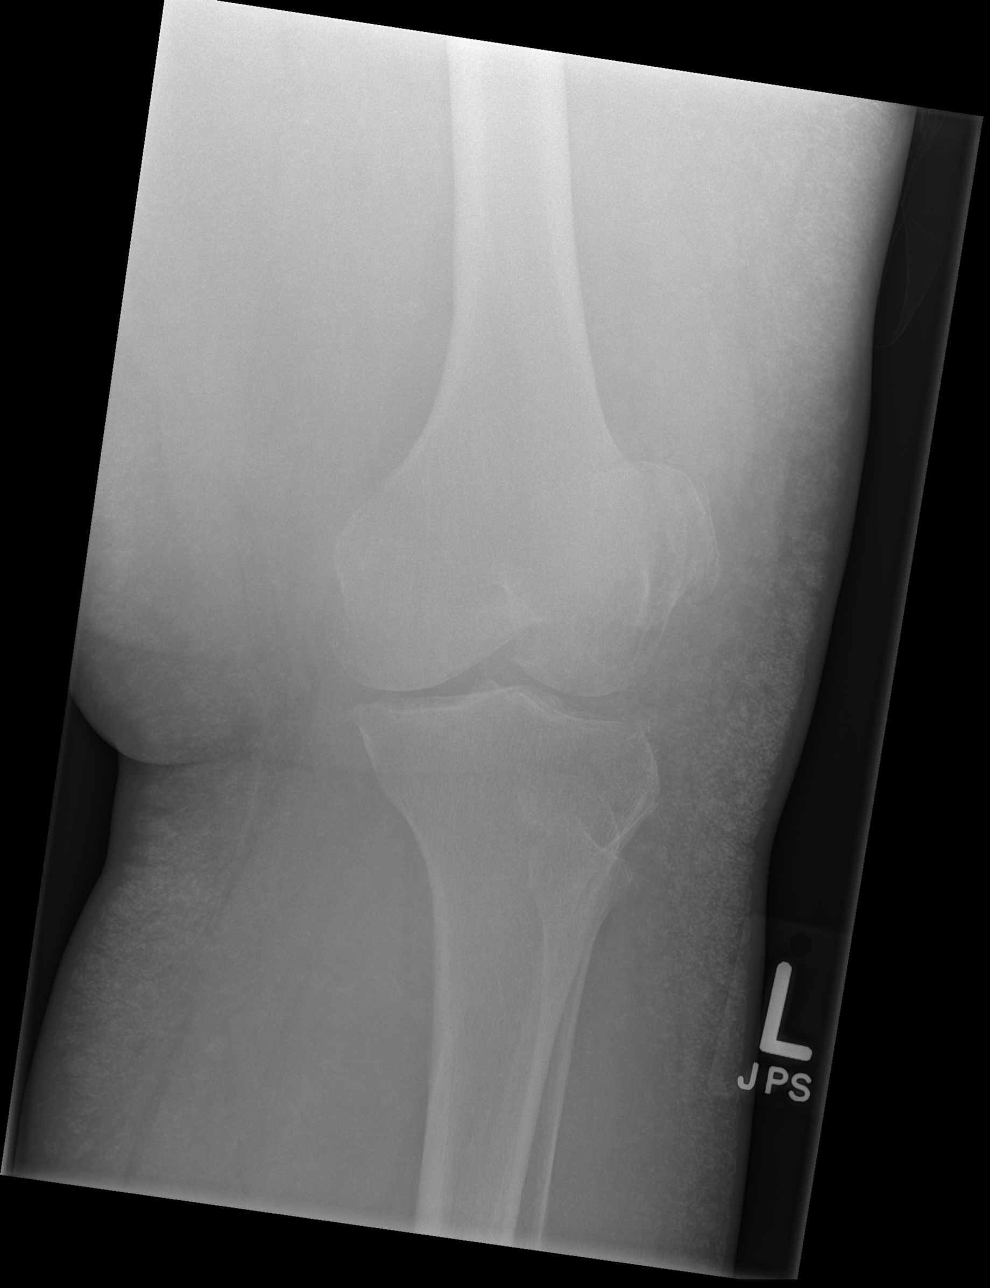

[x knee obl left (2 of 2)]
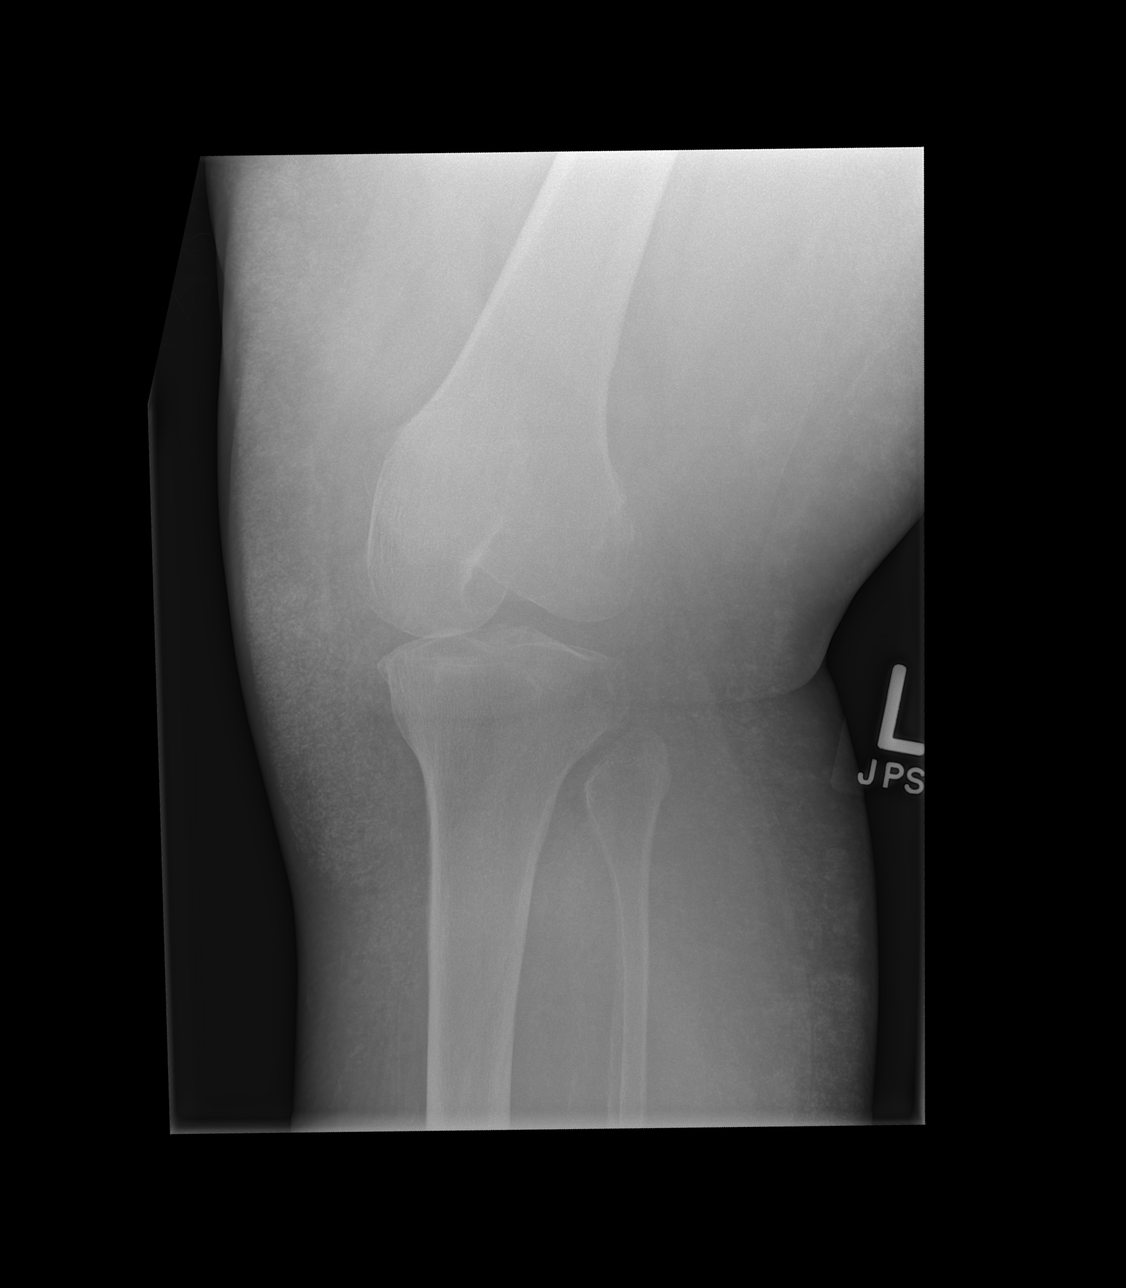

[x knee lat left]
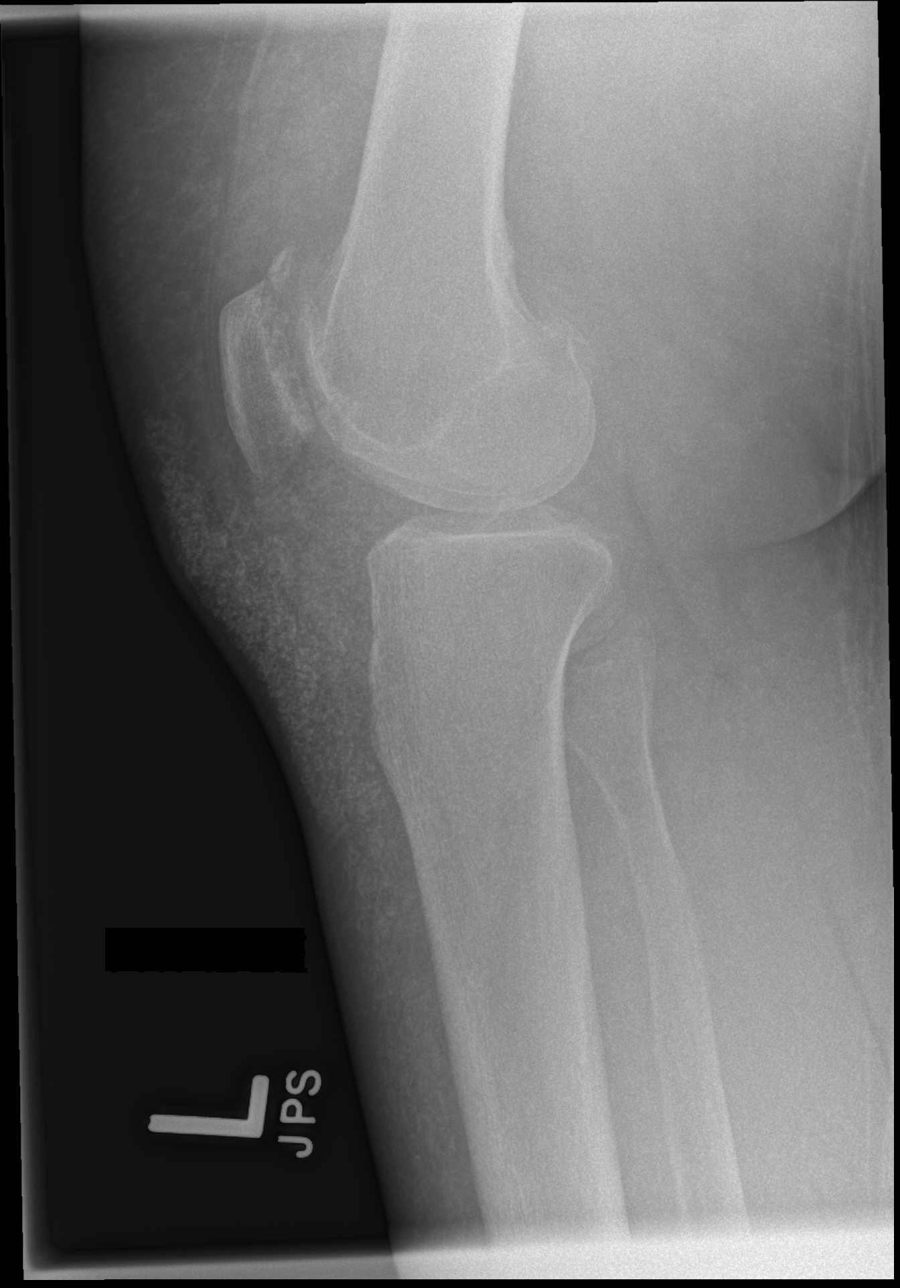

[4 of 4 positions shown; findings below may reference images not displayed]

FINDINGS: There appears to be lateral dislocation of the patella. Joint spaces
are unremarkable. No definite fracture is noted. No definite joint
effusion is noted.
IMPRESSION: Probable lateral dislocation of the patella.

## 2021-04-13 IMAGING — DX DG ANKLE COMPLETE 3+V*R*
3 series · 3 of 3 positions shown · non-contrast
Comparison: None.

CLINICAL DATA: Right lower extremity pain.  No known injury.

EXAM:
RIGHT ANKLE - COMPLETE 3+ VIEW

[x ankle ap right]
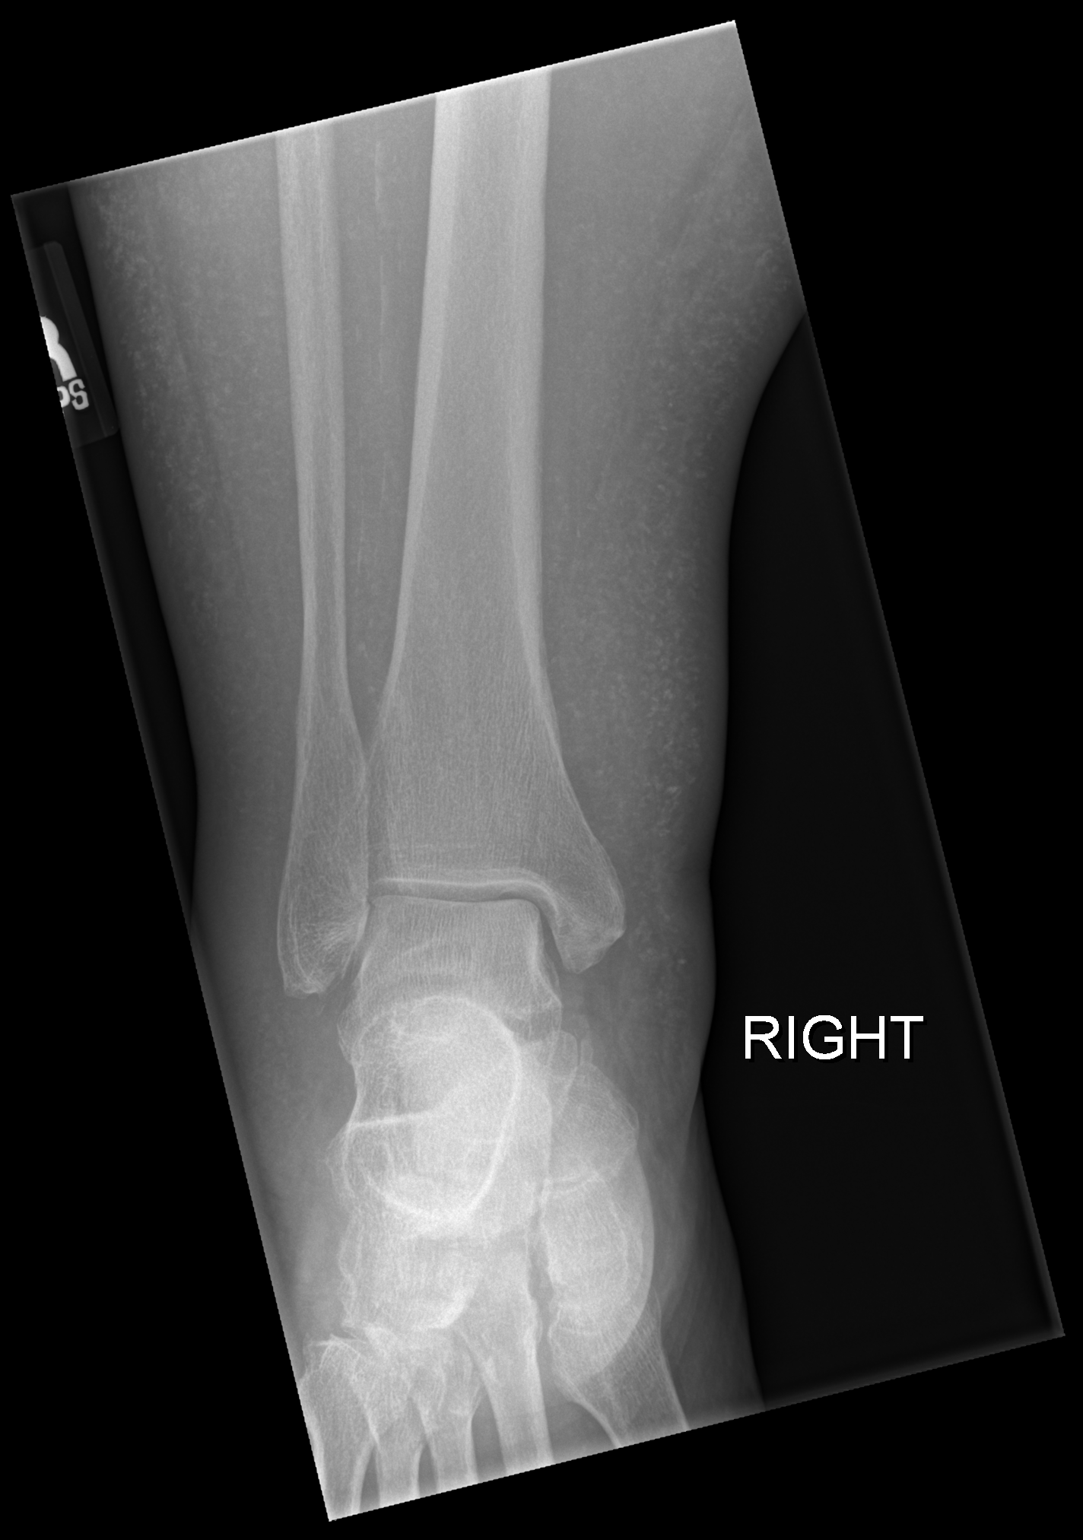

[x ankle obl right]
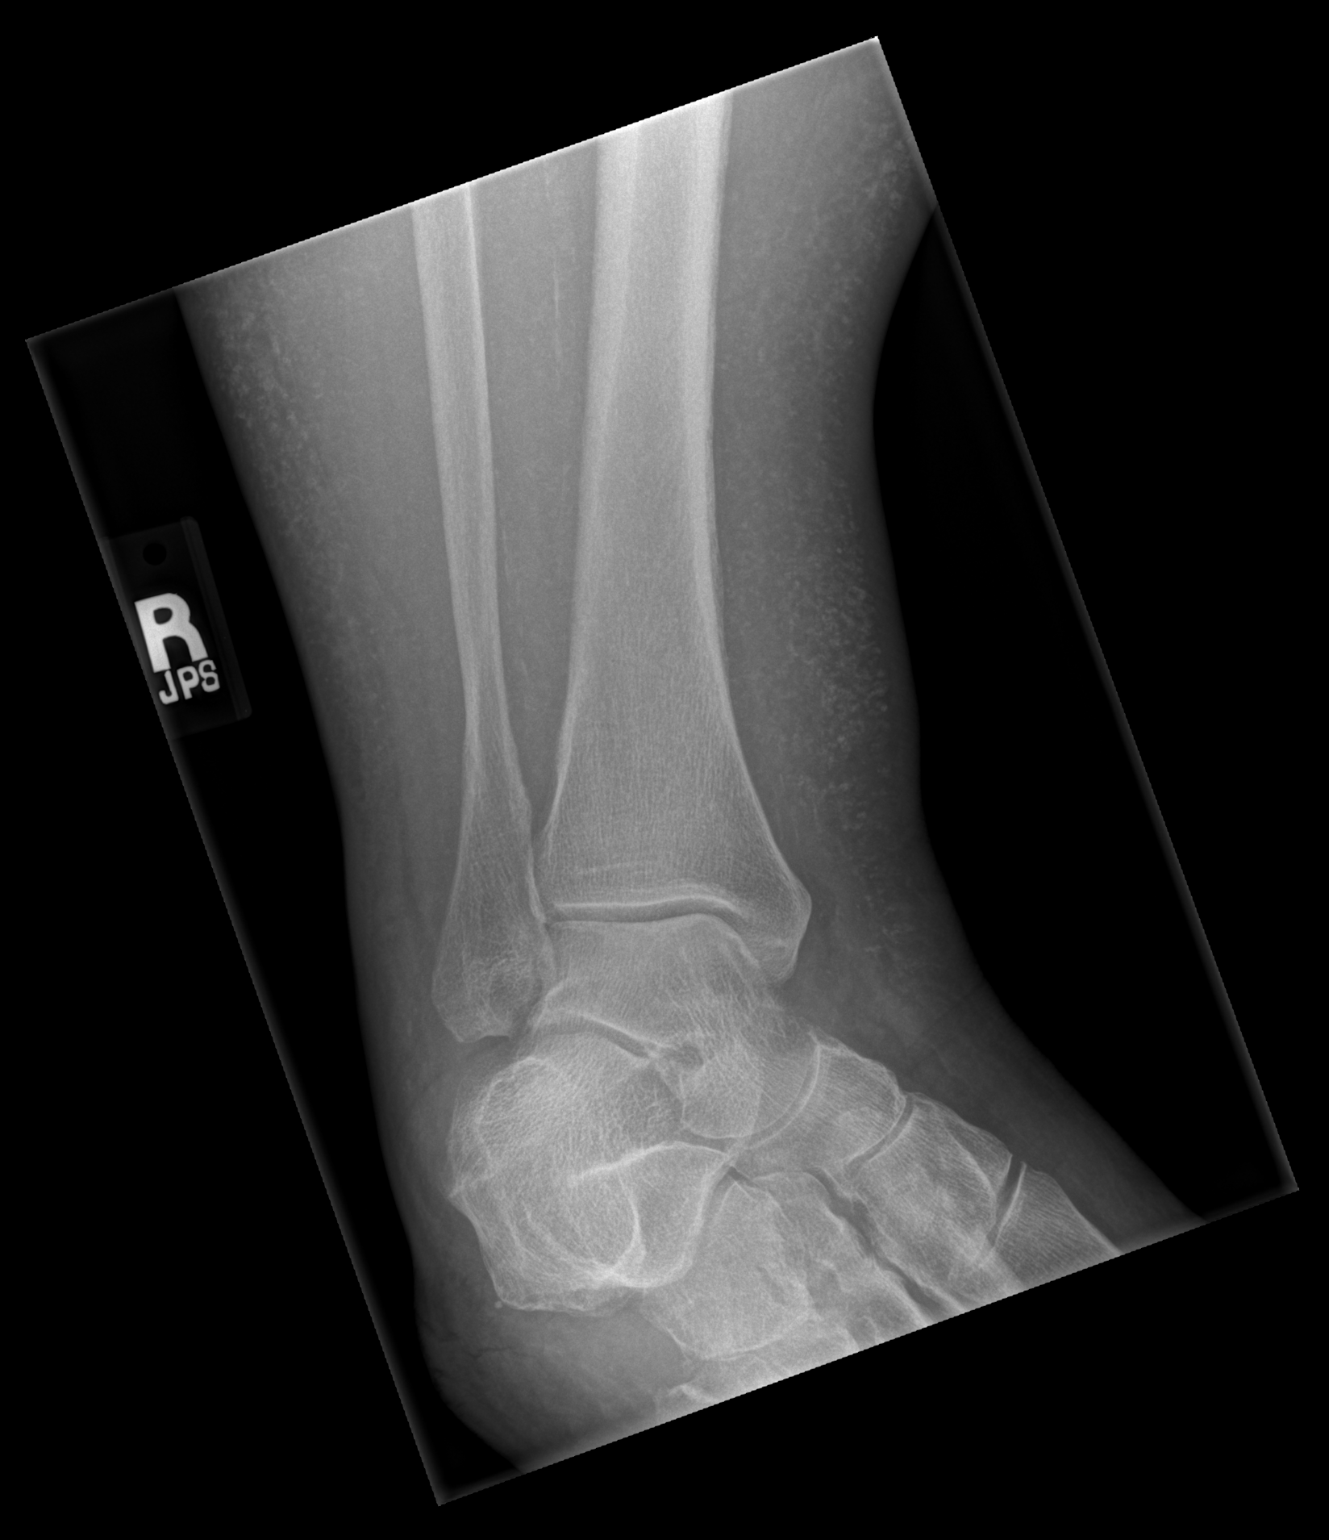

[x ankle lat right]
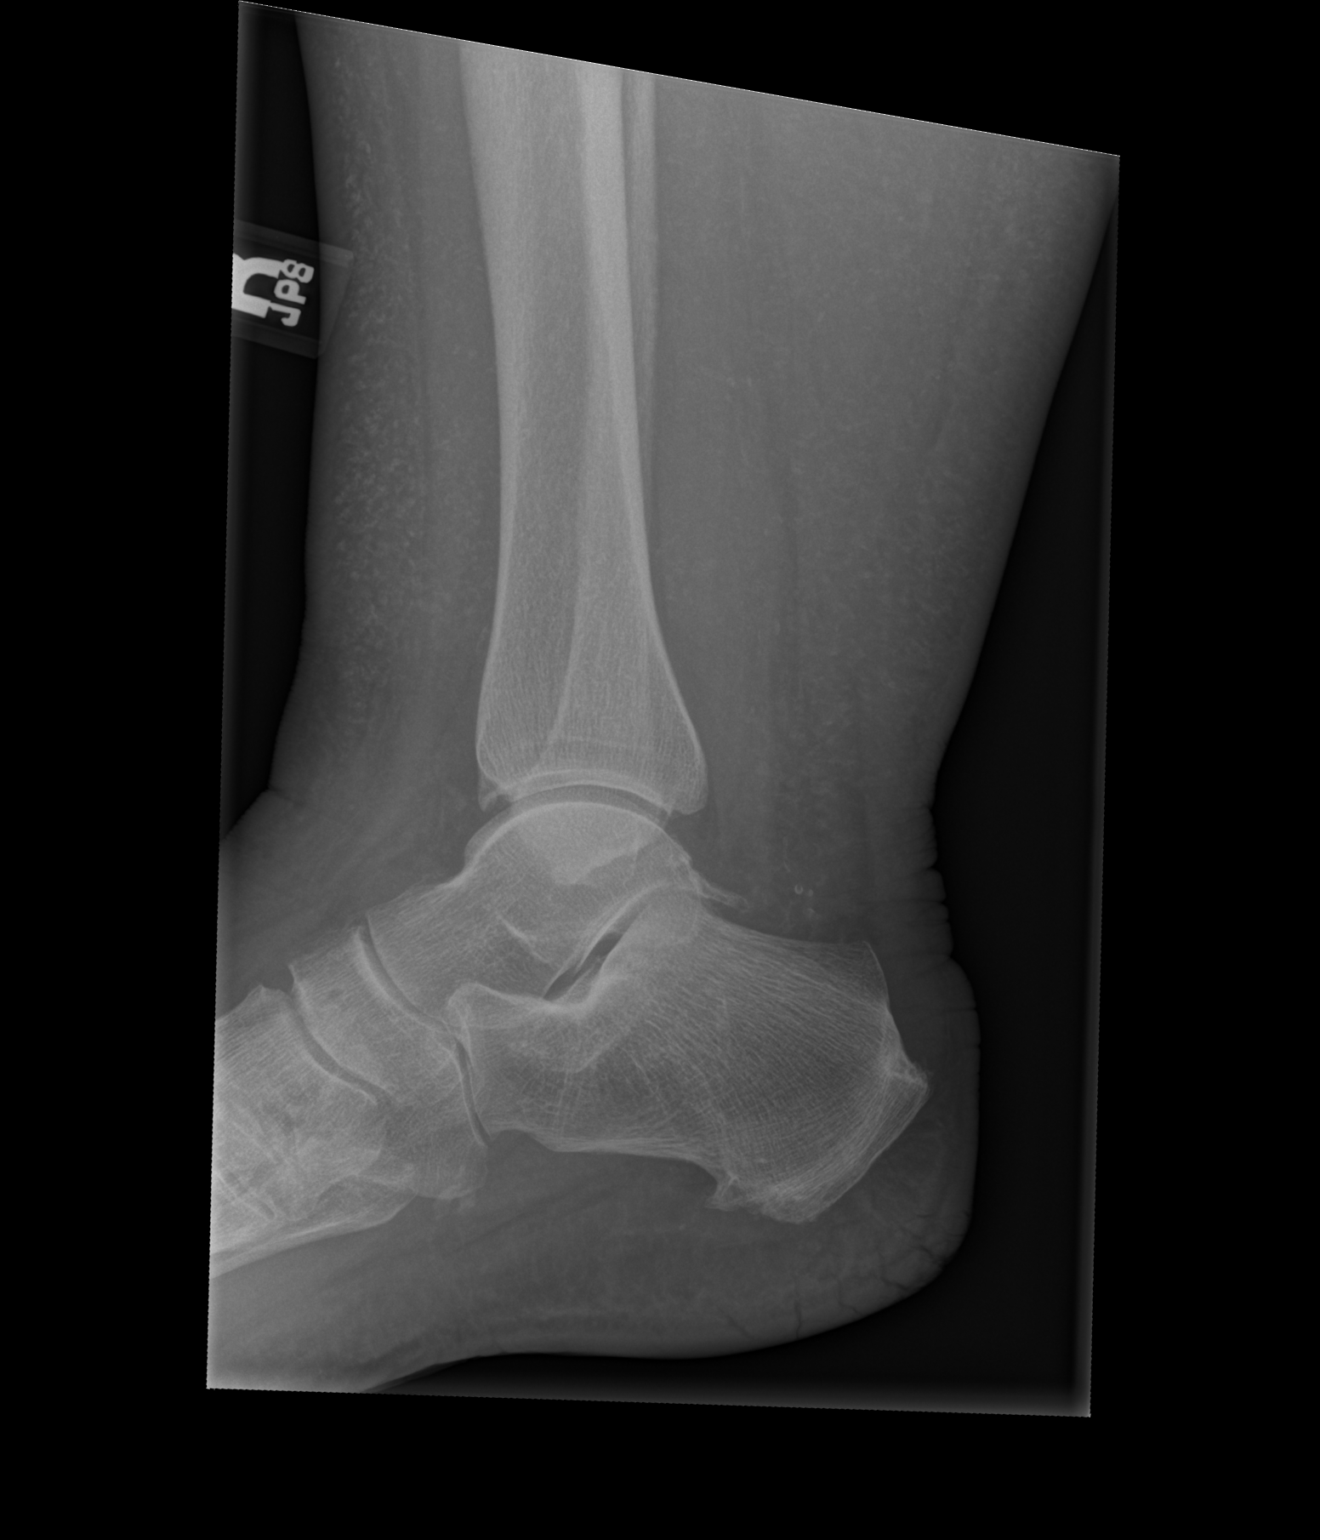

[3 of 3 positions shown; findings below may reference images not displayed]

FINDINGS: There is no evidence of fracture, dislocation, or joint effusion.
There is no evidence of arthropathy or other focal bone abnormality.
Soft tissues are unremarkable.
IMPRESSION: Negative.

## 2021-04-16 DIAGNOSIS — D509 Iron deficiency anemia, unspecified: Secondary | ICD-10-CM | POA: Diagnosis not present

## 2021-04-16 DIAGNOSIS — Z992 Dependence on renal dialysis: Secondary | ICD-10-CM | POA: Diagnosis not present

## 2021-04-16 DIAGNOSIS — N2581 Secondary hyperparathyroidism of renal origin: Secondary | ICD-10-CM | POA: Diagnosis not present

## 2021-04-16 DIAGNOSIS — N186 End stage renal disease: Secondary | ICD-10-CM | POA: Diagnosis not present

## 2021-04-16 DIAGNOSIS — D631 Anemia in chronic kidney disease: Secondary | ICD-10-CM | POA: Diagnosis not present

## 2021-04-18 DIAGNOSIS — M1712 Unilateral primary osteoarthritis, left knee: Secondary | ICD-10-CM | POA: Diagnosis not present

## 2021-04-18 DIAGNOSIS — M25562 Pain in left knee: Secondary | ICD-10-CM | POA: Diagnosis not present

## 2021-04-18 DIAGNOSIS — Z043 Encounter for examination and observation following other accident: Secondary | ICD-10-CM | POA: Diagnosis not present

## 2021-04-18 DIAGNOSIS — Z992 Dependence on renal dialysis: Secondary | ICD-10-CM | POA: Diagnosis not present

## 2021-04-18 DIAGNOSIS — Z86711 Personal history of pulmonary embolism: Secondary | ICD-10-CM | POA: Diagnosis not present

## 2021-04-18 DIAGNOSIS — I120 Hypertensive chronic kidney disease with stage 5 chronic kidney disease or end stage renal disease: Secondary | ICD-10-CM | POA: Diagnosis not present

## 2021-04-18 DIAGNOSIS — K219 Gastro-esophageal reflux disease without esophagitis: Secondary | ICD-10-CM | POA: Diagnosis not present

## 2021-04-18 DIAGNOSIS — S8992XA Unspecified injury of left lower leg, initial encounter: Secondary | ICD-10-CM | POA: Diagnosis not present

## 2021-04-18 DIAGNOSIS — M25462 Effusion, left knee: Secondary | ICD-10-CM | POA: Diagnosis not present

## 2021-04-18 DIAGNOSIS — N186 End stage renal disease: Secondary | ICD-10-CM | POA: Diagnosis not present

## 2021-04-18 DIAGNOSIS — Z9049 Acquired absence of other specified parts of digestive tract: Secondary | ICD-10-CM | POA: Diagnosis not present

## 2021-04-20 DIAGNOSIS — N186 End stage renal disease: Secondary | ICD-10-CM | POA: Diagnosis not present

## 2021-04-20 DIAGNOSIS — D631 Anemia in chronic kidney disease: Secondary | ICD-10-CM | POA: Diagnosis not present

## 2021-04-20 DIAGNOSIS — D509 Iron deficiency anemia, unspecified: Secondary | ICD-10-CM | POA: Diagnosis not present

## 2021-04-20 DIAGNOSIS — N2581 Secondary hyperparathyroidism of renal origin: Secondary | ICD-10-CM | POA: Diagnosis not present

## 2021-04-20 DIAGNOSIS — Z992 Dependence on renal dialysis: Secondary | ICD-10-CM | POA: Diagnosis not present

## 2021-04-23 DIAGNOSIS — D509 Iron deficiency anemia, unspecified: Secondary | ICD-10-CM | POA: Diagnosis not present

## 2021-04-23 DIAGNOSIS — N2581 Secondary hyperparathyroidism of renal origin: Secondary | ICD-10-CM | POA: Diagnosis not present

## 2021-04-23 DIAGNOSIS — N186 End stage renal disease: Secondary | ICD-10-CM | POA: Diagnosis not present

## 2021-04-23 DIAGNOSIS — Z992 Dependence on renal dialysis: Secondary | ICD-10-CM | POA: Diagnosis not present

## 2021-04-23 DIAGNOSIS — D631 Anemia in chronic kidney disease: Secondary | ICD-10-CM | POA: Diagnosis not present

## 2021-04-25 DIAGNOSIS — N186 End stage renal disease: Secondary | ICD-10-CM | POA: Diagnosis not present

## 2021-04-25 DIAGNOSIS — N2581 Secondary hyperparathyroidism of renal origin: Secondary | ICD-10-CM | POA: Diagnosis not present

## 2021-04-25 DIAGNOSIS — Z992 Dependence on renal dialysis: Secondary | ICD-10-CM | POA: Diagnosis not present

## 2021-04-25 DIAGNOSIS — D631 Anemia in chronic kidney disease: Secondary | ICD-10-CM | POA: Diagnosis not present

## 2021-04-25 DIAGNOSIS — D509 Iron deficiency anemia, unspecified: Secondary | ICD-10-CM | POA: Diagnosis not present

## 2021-04-26 DIAGNOSIS — M1712 Unilateral primary osteoarthritis, left knee: Secondary | ICD-10-CM | POA: Diagnosis not present

## 2021-04-27 DIAGNOSIS — N186 End stage renal disease: Secondary | ICD-10-CM | POA: Diagnosis not present

## 2021-04-27 DIAGNOSIS — N2581 Secondary hyperparathyroidism of renal origin: Secondary | ICD-10-CM | POA: Diagnosis not present

## 2021-04-27 DIAGNOSIS — D509 Iron deficiency anemia, unspecified: Secondary | ICD-10-CM | POA: Diagnosis not present

## 2021-04-27 DIAGNOSIS — Z992 Dependence on renal dialysis: Secondary | ICD-10-CM | POA: Diagnosis not present

## 2021-04-27 DIAGNOSIS — D631 Anemia in chronic kidney disease: Secondary | ICD-10-CM | POA: Diagnosis not present

## 2021-04-28 DIAGNOSIS — G459 Transient cerebral ischemic attack, unspecified: Secondary | ICD-10-CM | POA: Diagnosis not present

## 2021-04-28 DIAGNOSIS — R200 Anesthesia of skin: Secondary | ICD-10-CM | POA: Diagnosis not present

## 2021-04-28 DIAGNOSIS — R531 Weakness: Secondary | ICD-10-CM | POA: Diagnosis not present

## 2021-04-28 DIAGNOSIS — G2581 Restless legs syndrome: Secondary | ICD-10-CM | POA: Diagnosis not present

## 2021-04-28 DIAGNOSIS — E875 Hyperkalemia: Secondary | ICD-10-CM | POA: Diagnosis not present

## 2021-04-28 DIAGNOSIS — M4312 Spondylolisthesis, cervical region: Secondary | ICD-10-CM | POA: Diagnosis not present

## 2021-04-28 DIAGNOSIS — M109 Gout, unspecified: Secondary | ICD-10-CM | POA: Diagnosis not present

## 2021-04-28 DIAGNOSIS — M47812 Spondylosis without myelopathy or radiculopathy, cervical region: Secondary | ICD-10-CM | POA: Diagnosis not present

## 2021-04-28 DIAGNOSIS — N189 Chronic kidney disease, unspecified: Secondary | ICD-10-CM | POA: Diagnosis not present

## 2021-04-28 DIAGNOSIS — Z992 Dependence on renal dialysis: Secondary | ICD-10-CM | POA: Diagnosis not present

## 2021-04-28 DIAGNOSIS — I517 Cardiomegaly: Secondary | ICD-10-CM | POA: Diagnosis not present

## 2021-04-28 DIAGNOSIS — I672 Cerebral atherosclerosis: Secondary | ICD-10-CM | POA: Diagnosis not present

## 2021-04-28 DIAGNOSIS — R0902 Hypoxemia: Secondary | ICD-10-CM | POA: Diagnosis not present

## 2021-04-28 DIAGNOSIS — G8194 Hemiplegia, unspecified affecting left nondominant side: Secondary | ICD-10-CM | POA: Diagnosis not present

## 2021-04-28 DIAGNOSIS — I639 Cerebral infarction, unspecified: Secondary | ICD-10-CM | POA: Diagnosis not present

## 2021-04-28 DIAGNOSIS — R0689 Other abnormalities of breathing: Secondary | ICD-10-CM | POA: Diagnosis not present

## 2021-04-28 DIAGNOSIS — R471 Dysarthria and anarthria: Secondary | ICD-10-CM | POA: Diagnosis not present

## 2021-04-28 DIAGNOSIS — M1A00X0 Idiopathic chronic gout, unspecified site, without tophus (tophi): Secondary | ICD-10-CM | POA: Diagnosis not present

## 2021-04-28 DIAGNOSIS — I6523 Occlusion and stenosis of bilateral carotid arteries: Secondary | ICD-10-CM | POA: Diagnosis not present

## 2021-04-28 DIAGNOSIS — N186 End stage renal disease: Secondary | ICD-10-CM | POA: Diagnosis not present

## 2021-04-28 DIAGNOSIS — R29704 NIHSS score 4: Secondary | ICD-10-CM | POA: Diagnosis not present

## 2021-04-28 DIAGNOSIS — M1990 Unspecified osteoarthritis, unspecified site: Secondary | ICD-10-CM | POA: Diagnosis not present

## 2021-04-28 DIAGNOSIS — R4781 Slurred speech: Secondary | ICD-10-CM | POA: Diagnosis not present

## 2021-04-28 DIAGNOSIS — J811 Chronic pulmonary edema: Secondary | ICD-10-CM | POA: Diagnosis not present

## 2021-04-28 DIAGNOSIS — I10 Essential (primary) hypertension: Secondary | ICD-10-CM | POA: Diagnosis not present

## 2021-04-28 DIAGNOSIS — R29818 Other symptoms and signs involving the nervous system: Secondary | ICD-10-CM | POA: Diagnosis not present

## 2021-04-28 DIAGNOSIS — R202 Paresthesia of skin: Secondary | ICD-10-CM | POA: Diagnosis not present

## 2021-04-28 DIAGNOSIS — G4489 Other headache syndrome: Secondary | ICD-10-CM | POA: Diagnosis not present

## 2021-04-28 DIAGNOSIS — Z981 Arthrodesis status: Secondary | ICD-10-CM | POA: Diagnosis not present

## 2021-04-28 DIAGNOSIS — I129 Hypertensive chronic kidney disease with stage 1 through stage 4 chronic kidney disease, or unspecified chronic kidney disease: Secondary | ICD-10-CM | POA: Diagnosis not present

## 2021-04-29 DIAGNOSIS — R29818 Other symptoms and signs involving the nervous system: Secondary | ICD-10-CM | POA: Diagnosis not present

## 2021-04-29 DIAGNOSIS — N186 End stage renal disease: Secondary | ICD-10-CM | POA: Diagnosis present

## 2021-04-29 DIAGNOSIS — Z9049 Acquired absence of other specified parts of digestive tract: Secondary | ICD-10-CM | POA: Diagnosis not present

## 2021-04-29 DIAGNOSIS — D631 Anemia in chronic kidney disease: Secondary | ICD-10-CM | POA: Diagnosis present

## 2021-04-29 DIAGNOSIS — G2581 Restless legs syndrome: Secondary | ICD-10-CM | POA: Diagnosis present

## 2021-04-29 DIAGNOSIS — Z7982 Long term (current) use of aspirin: Secondary | ICD-10-CM | POA: Diagnosis not present

## 2021-04-29 DIAGNOSIS — R0689 Other abnormalities of breathing: Secondary | ICD-10-CM | POA: Diagnosis not present

## 2021-04-29 DIAGNOSIS — Z981 Arthrodesis status: Secondary | ICD-10-CM | POA: Diagnosis not present

## 2021-04-29 DIAGNOSIS — M898X9 Other specified disorders of bone, unspecified site: Secondary | ICD-10-CM | POA: Diagnosis present

## 2021-04-29 DIAGNOSIS — Z992 Dependence on renal dialysis: Secondary | ICD-10-CM | POA: Diagnosis not present

## 2021-04-29 DIAGNOSIS — M542 Cervicalgia: Secondary | ICD-10-CM | POA: Diagnosis not present

## 2021-04-29 DIAGNOSIS — R2990 Unspecified symptoms and signs involving the nervous system: Secondary | ICD-10-CM | POA: Diagnosis not present

## 2021-04-29 DIAGNOSIS — G459 Transient cerebral ischemic attack, unspecified: Secondary | ICD-10-CM | POA: Diagnosis present

## 2021-04-29 DIAGNOSIS — Z932 Ileostomy status: Secondary | ICD-10-CM | POA: Diagnosis not present

## 2021-04-29 DIAGNOSIS — J9610 Chronic respiratory failure, unspecified whether with hypoxia or hypercapnia: Secondary | ICD-10-CM | POA: Diagnosis present

## 2021-04-29 DIAGNOSIS — M4312 Spondylolisthesis, cervical region: Secondary | ICD-10-CM | POA: Diagnosis not present

## 2021-04-29 DIAGNOSIS — Z86711 Personal history of pulmonary embolism: Secondary | ICD-10-CM | POA: Diagnosis not present

## 2021-04-29 DIAGNOSIS — Z9981 Dependence on supplemental oxygen: Secondary | ICD-10-CM | POA: Diagnosis not present

## 2021-04-29 DIAGNOSIS — G8929 Other chronic pain: Secondary | ICD-10-CM | POA: Diagnosis present

## 2021-04-29 DIAGNOSIS — I120 Hypertensive chronic kidney disease with stage 5 chronic kidney disease or end stage renal disease: Secondary | ICD-10-CM | POA: Diagnosis present

## 2021-04-29 DIAGNOSIS — M1A9XX0 Chronic gout, unspecified, without tophus (tophi): Secondary | ICD-10-CM | POA: Diagnosis present

## 2021-04-29 DIAGNOSIS — E875 Hyperkalemia: Secondary | ICD-10-CM | POA: Diagnosis not present

## 2021-04-29 DIAGNOSIS — M1712 Unilateral primary osteoarthritis, left knee: Secondary | ICD-10-CM | POA: Diagnosis present

## 2021-04-29 DIAGNOSIS — R200 Anesthesia of skin: Secondary | ICD-10-CM | POA: Diagnosis not present

## 2021-04-29 DIAGNOSIS — M1A00X0 Idiopathic chronic gout, unspecified site, without tophus (tophi): Secondary | ICD-10-CM | POA: Diagnosis not present

## 2021-04-29 DIAGNOSIS — M47812 Spondylosis without myelopathy or radiculopathy, cervical region: Secondary | ICD-10-CM | POA: Diagnosis not present

## 2021-04-30 DIAGNOSIS — D631 Anemia in chronic kidney disease: Secondary | ICD-10-CM | POA: Diagnosis not present

## 2021-04-30 DIAGNOSIS — Z992 Dependence on renal dialysis: Secondary | ICD-10-CM | POA: Diagnosis not present

## 2021-04-30 DIAGNOSIS — E875 Hyperkalemia: Secondary | ICD-10-CM | POA: Diagnosis not present

## 2021-04-30 DIAGNOSIS — N186 End stage renal disease: Secondary | ICD-10-CM | POA: Diagnosis not present

## 2021-04-30 DIAGNOSIS — G459 Transient cerebral ischemic attack, unspecified: Secondary | ICD-10-CM | POA: Diagnosis not present

## 2021-05-01 DIAGNOSIS — N186 End stage renal disease: Secondary | ICD-10-CM | POA: Diagnosis not present

## 2021-05-01 DIAGNOSIS — Z992 Dependence on renal dialysis: Secondary | ICD-10-CM | POA: Diagnosis not present

## 2021-05-01 DIAGNOSIS — D631 Anemia in chronic kidney disease: Secondary | ICD-10-CM | POA: Diagnosis not present

## 2021-05-01 DIAGNOSIS — E875 Hyperkalemia: Secondary | ICD-10-CM | POA: Diagnosis not present

## 2021-05-01 DIAGNOSIS — G459 Transient cerebral ischemic attack, unspecified: Secondary | ICD-10-CM | POA: Diagnosis not present

## 2021-05-03 DIAGNOSIS — G459 Transient cerebral ischemic attack, unspecified: Secondary | ICD-10-CM | POA: Diagnosis not present

## 2021-05-03 DIAGNOSIS — Z139 Encounter for screening, unspecified: Secondary | ICD-10-CM | POA: Diagnosis not present

## 2021-05-03 DIAGNOSIS — Z7689 Persons encountering health services in other specified circumstances: Secondary | ICD-10-CM | POA: Diagnosis not present

## 2021-05-03 DIAGNOSIS — Z0000 Encounter for general adult medical examination without abnormal findings: Secondary | ICD-10-CM | POA: Diagnosis not present

## 2021-05-03 DIAGNOSIS — Z136 Encounter for screening for cardiovascular disorders: Secondary | ICD-10-CM | POA: Diagnosis not present

## 2021-05-03 DIAGNOSIS — Z1331 Encounter for screening for depression: Secondary | ICD-10-CM | POA: Diagnosis not present

## 2021-05-03 DIAGNOSIS — Z23 Encounter for immunization: Secondary | ICD-10-CM | POA: Diagnosis not present

## 2021-05-04 DIAGNOSIS — N186 End stage renal disease: Secondary | ICD-10-CM | POA: Diagnosis not present

## 2021-05-04 DIAGNOSIS — D509 Iron deficiency anemia, unspecified: Secondary | ICD-10-CM | POA: Diagnosis not present

## 2021-05-04 DIAGNOSIS — N2581 Secondary hyperparathyroidism of renal origin: Secondary | ICD-10-CM | POA: Diagnosis not present

## 2021-05-04 DIAGNOSIS — Z992 Dependence on renal dialysis: Secondary | ICD-10-CM | POA: Diagnosis not present

## 2021-05-04 DIAGNOSIS — D631 Anemia in chronic kidney disease: Secondary | ICD-10-CM | POA: Diagnosis not present

## 2021-05-07 DIAGNOSIS — D631 Anemia in chronic kidney disease: Secondary | ICD-10-CM | POA: Diagnosis not present

## 2021-05-07 DIAGNOSIS — D509 Iron deficiency anemia, unspecified: Secondary | ICD-10-CM | POA: Diagnosis not present

## 2021-05-07 DIAGNOSIS — N2581 Secondary hyperparathyroidism of renal origin: Secondary | ICD-10-CM | POA: Diagnosis not present

## 2021-05-07 DIAGNOSIS — N186 End stage renal disease: Secondary | ICD-10-CM | POA: Diagnosis not present

## 2021-05-07 DIAGNOSIS — Z992 Dependence on renal dialysis: Secondary | ICD-10-CM | POA: Diagnosis not present

## 2021-05-09 DIAGNOSIS — D631 Anemia in chronic kidney disease: Secondary | ICD-10-CM | POA: Diagnosis not present

## 2021-05-09 DIAGNOSIS — N186 End stage renal disease: Secondary | ICD-10-CM | POA: Diagnosis not present

## 2021-05-09 DIAGNOSIS — Z992 Dependence on renal dialysis: Secondary | ICD-10-CM | POA: Diagnosis not present

## 2021-05-09 DIAGNOSIS — D509 Iron deficiency anemia, unspecified: Secondary | ICD-10-CM | POA: Diagnosis not present

## 2021-05-09 DIAGNOSIS — N2581 Secondary hyperparathyroidism of renal origin: Secondary | ICD-10-CM | POA: Diagnosis not present

## 2021-05-10 DIAGNOSIS — Z6841 Body Mass Index (BMI) 40.0 and over, adult: Secondary | ICD-10-CM | POA: Diagnosis not present

## 2021-05-10 DIAGNOSIS — Z992 Dependence on renal dialysis: Secondary | ICD-10-CM | POA: Diagnosis not present

## 2021-05-10 DIAGNOSIS — J984 Other disorders of lung: Secondary | ICD-10-CM | POA: Diagnosis not present

## 2021-05-10 DIAGNOSIS — J9610 Chronic respiratory failure, unspecified whether with hypoxia or hypercapnia: Secondary | ICD-10-CM | POA: Diagnosis not present

## 2021-05-11 DIAGNOSIS — Z992 Dependence on renal dialysis: Secondary | ICD-10-CM | POA: Diagnosis not present

## 2021-05-11 DIAGNOSIS — D509 Iron deficiency anemia, unspecified: Secondary | ICD-10-CM | POA: Diagnosis not present

## 2021-05-11 DIAGNOSIS — N2581 Secondary hyperparathyroidism of renal origin: Secondary | ICD-10-CM | POA: Diagnosis not present

## 2021-05-11 DIAGNOSIS — N186 End stage renal disease: Secondary | ICD-10-CM | POA: Diagnosis not present

## 2021-05-11 DIAGNOSIS — D631 Anemia in chronic kidney disease: Secondary | ICD-10-CM | POA: Diagnosis not present

## 2021-05-14 DIAGNOSIS — D509 Iron deficiency anemia, unspecified: Secondary | ICD-10-CM | POA: Diagnosis not present

## 2021-05-14 DIAGNOSIS — Z992 Dependence on renal dialysis: Secondary | ICD-10-CM | POA: Diagnosis not present

## 2021-05-14 DIAGNOSIS — N2581 Secondary hyperparathyroidism of renal origin: Secondary | ICD-10-CM | POA: Diagnosis not present

## 2021-05-14 DIAGNOSIS — N186 End stage renal disease: Secondary | ICD-10-CM | POA: Diagnosis not present

## 2021-05-14 DIAGNOSIS — D631 Anemia in chronic kidney disease: Secondary | ICD-10-CM | POA: Diagnosis not present

## 2021-05-16 DIAGNOSIS — N186 End stage renal disease: Secondary | ICD-10-CM | POA: Diagnosis not present

## 2021-05-16 DIAGNOSIS — D509 Iron deficiency anemia, unspecified: Secondary | ICD-10-CM | POA: Diagnosis not present

## 2021-05-16 DIAGNOSIS — Z992 Dependence on renal dialysis: Secondary | ICD-10-CM | POA: Diagnosis not present

## 2021-05-16 DIAGNOSIS — D631 Anemia in chronic kidney disease: Secondary | ICD-10-CM | POA: Diagnosis not present

## 2021-05-16 DIAGNOSIS — N2581 Secondary hyperparathyroidism of renal origin: Secondary | ICD-10-CM | POA: Diagnosis not present

## 2021-05-18 DIAGNOSIS — D631 Anemia in chronic kidney disease: Secondary | ICD-10-CM | POA: Diagnosis not present

## 2021-05-18 DIAGNOSIS — N2581 Secondary hyperparathyroidism of renal origin: Secondary | ICD-10-CM | POA: Diagnosis not present

## 2021-05-18 DIAGNOSIS — N186 End stage renal disease: Secondary | ICD-10-CM | POA: Diagnosis not present

## 2021-05-18 DIAGNOSIS — Z992 Dependence on renal dialysis: Secondary | ICD-10-CM | POA: Diagnosis not present

## 2021-05-18 DIAGNOSIS — D509 Iron deficiency anemia, unspecified: Secondary | ICD-10-CM | POA: Diagnosis not present

## 2021-05-20 DIAGNOSIS — N2581 Secondary hyperparathyroidism of renal origin: Secondary | ICD-10-CM | POA: Diagnosis not present

## 2021-05-20 DIAGNOSIS — N186 End stage renal disease: Secondary | ICD-10-CM | POA: Diagnosis not present

## 2021-05-20 DIAGNOSIS — Z992 Dependence on renal dialysis: Secondary | ICD-10-CM | POA: Diagnosis not present

## 2021-05-20 DIAGNOSIS — D631 Anemia in chronic kidney disease: Secondary | ICD-10-CM | POA: Diagnosis not present

## 2021-05-20 DIAGNOSIS — D509 Iron deficiency anemia, unspecified: Secondary | ICD-10-CM | POA: Diagnosis not present

## 2021-05-22 DIAGNOSIS — D631 Anemia in chronic kidney disease: Secondary | ICD-10-CM | POA: Diagnosis not present

## 2021-05-22 DIAGNOSIS — Z992 Dependence on renal dialysis: Secondary | ICD-10-CM | POA: Diagnosis not present

## 2021-05-22 DIAGNOSIS — D509 Iron deficiency anemia, unspecified: Secondary | ICD-10-CM | POA: Diagnosis not present

## 2021-05-22 DIAGNOSIS — N186 End stage renal disease: Secondary | ICD-10-CM | POA: Diagnosis not present

## 2021-05-22 DIAGNOSIS — N2581 Secondary hyperparathyroidism of renal origin: Secondary | ICD-10-CM | POA: Diagnosis not present

## 2021-05-25 DIAGNOSIS — N2581 Secondary hyperparathyroidism of renal origin: Secondary | ICD-10-CM | POA: Diagnosis not present

## 2021-05-25 DIAGNOSIS — Z992 Dependence on renal dialysis: Secondary | ICD-10-CM | POA: Diagnosis not present

## 2021-05-25 DIAGNOSIS — N186 End stage renal disease: Secondary | ICD-10-CM | POA: Diagnosis not present

## 2021-05-25 DIAGNOSIS — D631 Anemia in chronic kidney disease: Secondary | ICD-10-CM | POA: Diagnosis not present

## 2021-05-25 DIAGNOSIS — D509 Iron deficiency anemia, unspecified: Secondary | ICD-10-CM | POA: Diagnosis not present

## 2021-05-28 DIAGNOSIS — D509 Iron deficiency anemia, unspecified: Secondary | ICD-10-CM | POA: Diagnosis not present

## 2021-05-28 DIAGNOSIS — N2581 Secondary hyperparathyroidism of renal origin: Secondary | ICD-10-CM | POA: Diagnosis not present

## 2021-05-28 DIAGNOSIS — Z992 Dependence on renal dialysis: Secondary | ICD-10-CM | POA: Diagnosis not present

## 2021-05-28 DIAGNOSIS — N186 End stage renal disease: Secondary | ICD-10-CM | POA: Diagnosis not present

## 2021-05-28 DIAGNOSIS — D631 Anemia in chronic kidney disease: Secondary | ICD-10-CM | POA: Diagnosis not present

## 2021-05-30 DIAGNOSIS — N186 End stage renal disease: Secondary | ICD-10-CM | POA: Diagnosis not present

## 2021-05-30 DIAGNOSIS — Z992 Dependence on renal dialysis: Secondary | ICD-10-CM | POA: Diagnosis not present

## 2021-06-01 DIAGNOSIS — N2581 Secondary hyperparathyroidism of renal origin: Secondary | ICD-10-CM | POA: Diagnosis not present

## 2021-06-01 DIAGNOSIS — D509 Iron deficiency anemia, unspecified: Secondary | ICD-10-CM | POA: Diagnosis not present

## 2021-06-01 DIAGNOSIS — D631 Anemia in chronic kidney disease: Secondary | ICD-10-CM | POA: Diagnosis not present

## 2021-06-01 DIAGNOSIS — N186 End stage renal disease: Secondary | ICD-10-CM | POA: Diagnosis not present

## 2021-06-01 DIAGNOSIS — Z992 Dependence on renal dialysis: Secondary | ICD-10-CM | POA: Diagnosis not present

## 2021-06-04 DIAGNOSIS — N186 End stage renal disease: Secondary | ICD-10-CM | POA: Diagnosis not present

## 2021-06-04 DIAGNOSIS — D631 Anemia in chronic kidney disease: Secondary | ICD-10-CM | POA: Diagnosis not present

## 2021-06-04 DIAGNOSIS — D509 Iron deficiency anemia, unspecified: Secondary | ICD-10-CM | POA: Diagnosis not present

## 2021-06-04 DIAGNOSIS — Z992 Dependence on renal dialysis: Secondary | ICD-10-CM | POA: Diagnosis not present

## 2021-06-04 DIAGNOSIS — N2581 Secondary hyperparathyroidism of renal origin: Secondary | ICD-10-CM | POA: Diagnosis not present

## 2021-06-06 DIAGNOSIS — D509 Iron deficiency anemia, unspecified: Secondary | ICD-10-CM | POA: Diagnosis not present

## 2021-06-06 DIAGNOSIS — N186 End stage renal disease: Secondary | ICD-10-CM | POA: Diagnosis not present

## 2021-06-06 DIAGNOSIS — Z992 Dependence on renal dialysis: Secondary | ICD-10-CM | POA: Diagnosis not present

## 2021-06-06 DIAGNOSIS — N2581 Secondary hyperparathyroidism of renal origin: Secondary | ICD-10-CM | POA: Diagnosis not present

## 2021-06-06 DIAGNOSIS — D631 Anemia in chronic kidney disease: Secondary | ICD-10-CM | POA: Diagnosis not present

## 2021-06-08 DIAGNOSIS — N2581 Secondary hyperparathyroidism of renal origin: Secondary | ICD-10-CM | POA: Diagnosis not present

## 2021-06-08 DIAGNOSIS — D509 Iron deficiency anemia, unspecified: Secondary | ICD-10-CM | POA: Diagnosis not present

## 2021-06-08 DIAGNOSIS — Z992 Dependence on renal dialysis: Secondary | ICD-10-CM | POA: Diagnosis not present

## 2021-06-08 DIAGNOSIS — D631 Anemia in chronic kidney disease: Secondary | ICD-10-CM | POA: Diagnosis not present

## 2021-06-08 DIAGNOSIS — N186 End stage renal disease: Secondary | ICD-10-CM | POA: Diagnosis not present

## 2021-06-11 DIAGNOSIS — Z992 Dependence on renal dialysis: Secondary | ICD-10-CM | POA: Diagnosis not present

## 2021-06-11 DIAGNOSIS — N2581 Secondary hyperparathyroidism of renal origin: Secondary | ICD-10-CM | POA: Diagnosis not present

## 2021-06-11 DIAGNOSIS — N186 End stage renal disease: Secondary | ICD-10-CM | POA: Diagnosis not present

## 2021-06-11 DIAGNOSIS — D509 Iron deficiency anemia, unspecified: Secondary | ICD-10-CM | POA: Diagnosis not present

## 2021-06-11 DIAGNOSIS — D631 Anemia in chronic kidney disease: Secondary | ICD-10-CM | POA: Diagnosis not present

## 2021-06-13 DIAGNOSIS — Z992 Dependence on renal dialysis: Secondary | ICD-10-CM | POA: Diagnosis not present

## 2021-06-13 DIAGNOSIS — D509 Iron deficiency anemia, unspecified: Secondary | ICD-10-CM | POA: Diagnosis not present

## 2021-06-13 DIAGNOSIS — D631 Anemia in chronic kidney disease: Secondary | ICD-10-CM | POA: Diagnosis not present

## 2021-06-13 DIAGNOSIS — N2581 Secondary hyperparathyroidism of renal origin: Secondary | ICD-10-CM | POA: Diagnosis not present

## 2021-06-13 DIAGNOSIS — N186 End stage renal disease: Secondary | ICD-10-CM | POA: Diagnosis not present

## 2021-06-15 DIAGNOSIS — N2581 Secondary hyperparathyroidism of renal origin: Secondary | ICD-10-CM | POA: Diagnosis not present

## 2021-06-15 DIAGNOSIS — D631 Anemia in chronic kidney disease: Secondary | ICD-10-CM | POA: Diagnosis not present

## 2021-06-15 DIAGNOSIS — Z992 Dependence on renal dialysis: Secondary | ICD-10-CM | POA: Diagnosis not present

## 2021-06-15 DIAGNOSIS — D509 Iron deficiency anemia, unspecified: Secondary | ICD-10-CM | POA: Diagnosis not present

## 2021-06-15 DIAGNOSIS — N186 End stage renal disease: Secondary | ICD-10-CM | POA: Diagnosis not present

## 2021-06-18 DIAGNOSIS — N186 End stage renal disease: Secondary | ICD-10-CM | POA: Diagnosis not present

## 2021-06-18 DIAGNOSIS — D509 Iron deficiency anemia, unspecified: Secondary | ICD-10-CM | POA: Diagnosis not present

## 2021-06-18 DIAGNOSIS — Z992 Dependence on renal dialysis: Secondary | ICD-10-CM | POA: Diagnosis not present

## 2021-06-18 DIAGNOSIS — N2581 Secondary hyperparathyroidism of renal origin: Secondary | ICD-10-CM | POA: Diagnosis not present

## 2021-06-18 DIAGNOSIS — D631 Anemia in chronic kidney disease: Secondary | ICD-10-CM | POA: Diagnosis not present

## 2021-06-20 DIAGNOSIS — Z992 Dependence on renal dialysis: Secondary | ICD-10-CM | POA: Diagnosis not present

## 2021-06-20 DIAGNOSIS — N186 End stage renal disease: Secondary | ICD-10-CM | POA: Diagnosis not present

## 2021-06-20 DIAGNOSIS — D509 Iron deficiency anemia, unspecified: Secondary | ICD-10-CM | POA: Diagnosis not present

## 2021-06-20 DIAGNOSIS — N2581 Secondary hyperparathyroidism of renal origin: Secondary | ICD-10-CM | POA: Diagnosis not present

## 2021-06-20 DIAGNOSIS — D631 Anemia in chronic kidney disease: Secondary | ICD-10-CM | POA: Diagnosis not present

## 2021-06-22 DIAGNOSIS — N186 End stage renal disease: Secondary | ICD-10-CM | POA: Diagnosis not present

## 2021-06-22 DIAGNOSIS — Z992 Dependence on renal dialysis: Secondary | ICD-10-CM | POA: Diagnosis not present

## 2021-06-22 DIAGNOSIS — N2581 Secondary hyperparathyroidism of renal origin: Secondary | ICD-10-CM | POA: Diagnosis not present

## 2021-06-22 DIAGNOSIS — D509 Iron deficiency anemia, unspecified: Secondary | ICD-10-CM | POA: Diagnosis not present

## 2021-06-22 DIAGNOSIS — D631 Anemia in chronic kidney disease: Secondary | ICD-10-CM | POA: Diagnosis not present

## 2021-06-25 DIAGNOSIS — Z992 Dependence on renal dialysis: Secondary | ICD-10-CM | POA: Diagnosis not present

## 2021-06-25 DIAGNOSIS — N2581 Secondary hyperparathyroidism of renal origin: Secondary | ICD-10-CM | POA: Diagnosis not present

## 2021-06-25 DIAGNOSIS — D509 Iron deficiency anemia, unspecified: Secondary | ICD-10-CM | POA: Diagnosis not present

## 2021-06-25 DIAGNOSIS — N186 End stage renal disease: Secondary | ICD-10-CM | POA: Diagnosis not present

## 2021-06-25 DIAGNOSIS — D631 Anemia in chronic kidney disease: Secondary | ICD-10-CM | POA: Diagnosis not present

## 2021-06-27 DIAGNOSIS — Z992 Dependence on renal dialysis: Secondary | ICD-10-CM | POA: Diagnosis not present

## 2021-06-27 DIAGNOSIS — N2581 Secondary hyperparathyroidism of renal origin: Secondary | ICD-10-CM | POA: Diagnosis not present

## 2021-06-27 DIAGNOSIS — D509 Iron deficiency anemia, unspecified: Secondary | ICD-10-CM | POA: Diagnosis not present

## 2021-06-27 DIAGNOSIS — D631 Anemia in chronic kidney disease: Secondary | ICD-10-CM | POA: Diagnosis not present

## 2021-06-27 DIAGNOSIS — N186 End stage renal disease: Secondary | ICD-10-CM | POA: Diagnosis not present

## 2021-06-29 DIAGNOSIS — D631 Anemia in chronic kidney disease: Secondary | ICD-10-CM | POA: Diagnosis not present

## 2021-06-29 DIAGNOSIS — D509 Iron deficiency anemia, unspecified: Secondary | ICD-10-CM | POA: Diagnosis not present

## 2021-06-29 DIAGNOSIS — N186 End stage renal disease: Secondary | ICD-10-CM | POA: Diagnosis not present

## 2021-06-29 DIAGNOSIS — N2581 Secondary hyperparathyroidism of renal origin: Secondary | ICD-10-CM | POA: Diagnosis not present

## 2021-06-29 DIAGNOSIS — Z992 Dependence on renal dialysis: Secondary | ICD-10-CM | POA: Diagnosis not present

## 2021-06-30 DIAGNOSIS — N186 End stage renal disease: Secondary | ICD-10-CM | POA: Diagnosis not present

## 2021-06-30 DIAGNOSIS — Z992 Dependence on renal dialysis: Secondary | ICD-10-CM | POA: Diagnosis not present

## 2021-07-02 DIAGNOSIS — N186 End stage renal disease: Secondary | ICD-10-CM | POA: Diagnosis not present

## 2021-07-02 DIAGNOSIS — D509 Iron deficiency anemia, unspecified: Secondary | ICD-10-CM | POA: Diagnosis not present

## 2021-07-02 DIAGNOSIS — N2581 Secondary hyperparathyroidism of renal origin: Secondary | ICD-10-CM | POA: Diagnosis not present

## 2021-07-02 DIAGNOSIS — Z992 Dependence on renal dialysis: Secondary | ICD-10-CM | POA: Diagnosis not present

## 2021-07-02 DIAGNOSIS — D631 Anemia in chronic kidney disease: Secondary | ICD-10-CM | POA: Diagnosis not present

## 2021-07-04 DIAGNOSIS — N2581 Secondary hyperparathyroidism of renal origin: Secondary | ICD-10-CM | POA: Diagnosis not present

## 2021-07-04 DIAGNOSIS — D509 Iron deficiency anemia, unspecified: Secondary | ICD-10-CM | POA: Diagnosis not present

## 2021-07-04 DIAGNOSIS — Z992 Dependence on renal dialysis: Secondary | ICD-10-CM | POA: Diagnosis not present

## 2021-07-04 DIAGNOSIS — D631 Anemia in chronic kidney disease: Secondary | ICD-10-CM | POA: Diagnosis not present

## 2021-07-04 DIAGNOSIS — N186 End stage renal disease: Secondary | ICD-10-CM | POA: Diagnosis not present

## 2021-07-06 DIAGNOSIS — N186 End stage renal disease: Secondary | ICD-10-CM | POA: Diagnosis not present

## 2021-07-06 DIAGNOSIS — Z992 Dependence on renal dialysis: Secondary | ICD-10-CM | POA: Diagnosis not present

## 2021-07-06 DIAGNOSIS — D509 Iron deficiency anemia, unspecified: Secondary | ICD-10-CM | POA: Diagnosis not present

## 2021-07-06 DIAGNOSIS — D631 Anemia in chronic kidney disease: Secondary | ICD-10-CM | POA: Diagnosis not present

## 2021-07-06 DIAGNOSIS — N2581 Secondary hyperparathyroidism of renal origin: Secondary | ICD-10-CM | POA: Diagnosis not present

## 2021-07-08 DIAGNOSIS — M542 Cervicalgia: Secondary | ICD-10-CM | POA: Diagnosis not present

## 2021-07-08 DIAGNOSIS — L03115 Cellulitis of right lower limb: Secondary | ICD-10-CM | POA: Diagnosis not present

## 2021-07-08 DIAGNOSIS — N186 End stage renal disease: Secondary | ICD-10-CM | POA: Diagnosis not present

## 2021-07-08 DIAGNOSIS — Z992 Dependence on renal dialysis: Secondary | ICD-10-CM | POA: Diagnosis not present

## 2021-07-09 DIAGNOSIS — D631 Anemia in chronic kidney disease: Secondary | ICD-10-CM | POA: Diagnosis not present

## 2021-07-09 DIAGNOSIS — Z992 Dependence on renal dialysis: Secondary | ICD-10-CM | POA: Diagnosis not present

## 2021-07-09 DIAGNOSIS — N186 End stage renal disease: Secondary | ICD-10-CM | POA: Diagnosis not present

## 2021-07-09 DIAGNOSIS — N2581 Secondary hyperparathyroidism of renal origin: Secondary | ICD-10-CM | POA: Diagnosis not present

## 2021-07-09 DIAGNOSIS — D509 Iron deficiency anemia, unspecified: Secondary | ICD-10-CM | POA: Diagnosis not present

## 2021-07-11 DIAGNOSIS — D631 Anemia in chronic kidney disease: Secondary | ICD-10-CM | POA: Diagnosis not present

## 2021-07-11 DIAGNOSIS — D509 Iron deficiency anemia, unspecified: Secondary | ICD-10-CM | POA: Diagnosis not present

## 2021-07-11 DIAGNOSIS — N2581 Secondary hyperparathyroidism of renal origin: Secondary | ICD-10-CM | POA: Diagnosis not present

## 2021-07-11 DIAGNOSIS — N186 End stage renal disease: Secondary | ICD-10-CM | POA: Diagnosis not present

## 2021-07-11 DIAGNOSIS — Z992 Dependence on renal dialysis: Secondary | ICD-10-CM | POA: Diagnosis not present

## 2021-07-13 DIAGNOSIS — N186 End stage renal disease: Secondary | ICD-10-CM | POA: Diagnosis not present

## 2021-07-13 DIAGNOSIS — D509 Iron deficiency anemia, unspecified: Secondary | ICD-10-CM | POA: Diagnosis not present

## 2021-07-13 DIAGNOSIS — N2581 Secondary hyperparathyroidism of renal origin: Secondary | ICD-10-CM | POA: Diagnosis not present

## 2021-07-13 DIAGNOSIS — Z992 Dependence on renal dialysis: Secondary | ICD-10-CM | POA: Diagnosis not present

## 2021-07-13 DIAGNOSIS — D631 Anemia in chronic kidney disease: Secondary | ICD-10-CM | POA: Diagnosis not present

## 2021-07-16 DIAGNOSIS — N186 End stage renal disease: Secondary | ICD-10-CM | POA: Diagnosis not present

## 2021-07-16 DIAGNOSIS — Z992 Dependence on renal dialysis: Secondary | ICD-10-CM | POA: Diagnosis not present

## 2021-07-16 DIAGNOSIS — N2581 Secondary hyperparathyroidism of renal origin: Secondary | ICD-10-CM | POA: Diagnosis not present

## 2021-07-16 DIAGNOSIS — D509 Iron deficiency anemia, unspecified: Secondary | ICD-10-CM | POA: Diagnosis not present

## 2021-07-16 DIAGNOSIS — D631 Anemia in chronic kidney disease: Secondary | ICD-10-CM | POA: Diagnosis not present

## 2021-07-18 DIAGNOSIS — D509 Iron deficiency anemia, unspecified: Secondary | ICD-10-CM | POA: Diagnosis not present

## 2021-07-18 DIAGNOSIS — D631 Anemia in chronic kidney disease: Secondary | ICD-10-CM | POA: Diagnosis not present

## 2021-07-18 DIAGNOSIS — Z992 Dependence on renal dialysis: Secondary | ICD-10-CM | POA: Diagnosis not present

## 2021-07-18 DIAGNOSIS — N186 End stage renal disease: Secondary | ICD-10-CM | POA: Diagnosis not present

## 2021-07-18 DIAGNOSIS — N2581 Secondary hyperparathyroidism of renal origin: Secondary | ICD-10-CM | POA: Diagnosis not present

## 2021-07-19 DIAGNOSIS — E8779 Other fluid overload: Secondary | ICD-10-CM | POA: Diagnosis not present

## 2021-07-19 DIAGNOSIS — E8770 Fluid overload, unspecified: Secondary | ICD-10-CM | POA: Diagnosis not present

## 2021-07-19 DIAGNOSIS — N186 End stage renal disease: Secondary | ICD-10-CM | POA: Diagnosis not present

## 2021-07-19 DIAGNOSIS — Z992 Dependence on renal dialysis: Secondary | ICD-10-CM | POA: Diagnosis not present

## 2021-07-20 DIAGNOSIS — N2581 Secondary hyperparathyroidism of renal origin: Secondary | ICD-10-CM | POA: Diagnosis not present

## 2021-07-20 DIAGNOSIS — N186 End stage renal disease: Secondary | ICD-10-CM | POA: Diagnosis not present

## 2021-07-20 DIAGNOSIS — Z992 Dependence on renal dialysis: Secondary | ICD-10-CM | POA: Diagnosis not present

## 2021-07-20 DIAGNOSIS — D631 Anemia in chronic kidney disease: Secondary | ICD-10-CM | POA: Diagnosis not present

## 2021-07-20 DIAGNOSIS — D509 Iron deficiency anemia, unspecified: Secondary | ICD-10-CM | POA: Diagnosis not present

## 2021-07-23 DIAGNOSIS — N186 End stage renal disease: Secondary | ICD-10-CM | POA: Diagnosis not present

## 2021-07-23 DIAGNOSIS — N2581 Secondary hyperparathyroidism of renal origin: Secondary | ICD-10-CM | POA: Diagnosis not present

## 2021-07-23 DIAGNOSIS — D631 Anemia in chronic kidney disease: Secondary | ICD-10-CM | POA: Diagnosis not present

## 2021-07-23 DIAGNOSIS — D509 Iron deficiency anemia, unspecified: Secondary | ICD-10-CM | POA: Diagnosis not present

## 2021-07-23 DIAGNOSIS — Z992 Dependence on renal dialysis: Secondary | ICD-10-CM | POA: Diagnosis not present

## 2021-07-25 DIAGNOSIS — N186 End stage renal disease: Secondary | ICD-10-CM | POA: Diagnosis not present

## 2021-07-25 DIAGNOSIS — D509 Iron deficiency anemia, unspecified: Secondary | ICD-10-CM | POA: Diagnosis not present

## 2021-07-25 DIAGNOSIS — N2581 Secondary hyperparathyroidism of renal origin: Secondary | ICD-10-CM | POA: Diagnosis not present

## 2021-07-25 DIAGNOSIS — D631 Anemia in chronic kidney disease: Secondary | ICD-10-CM | POA: Diagnosis not present

## 2021-07-25 DIAGNOSIS — Z992 Dependence on renal dialysis: Secondary | ICD-10-CM | POA: Diagnosis not present

## 2021-07-27 DIAGNOSIS — D509 Iron deficiency anemia, unspecified: Secondary | ICD-10-CM | POA: Diagnosis not present

## 2021-07-27 DIAGNOSIS — N2581 Secondary hyperparathyroidism of renal origin: Secondary | ICD-10-CM | POA: Diagnosis not present

## 2021-07-27 DIAGNOSIS — N186 End stage renal disease: Secondary | ICD-10-CM | POA: Diagnosis not present

## 2021-07-27 DIAGNOSIS — Z992 Dependence on renal dialysis: Secondary | ICD-10-CM | POA: Diagnosis not present

## 2021-07-27 DIAGNOSIS — D631 Anemia in chronic kidney disease: Secondary | ICD-10-CM | POA: Diagnosis not present

## 2021-07-30 DIAGNOSIS — N186 End stage renal disease: Secondary | ICD-10-CM | POA: Diagnosis not present

## 2021-07-30 DIAGNOSIS — D631 Anemia in chronic kidney disease: Secondary | ICD-10-CM | POA: Diagnosis not present

## 2021-07-30 DIAGNOSIS — Z992 Dependence on renal dialysis: Secondary | ICD-10-CM | POA: Diagnosis not present

## 2021-07-30 DIAGNOSIS — N2581 Secondary hyperparathyroidism of renal origin: Secondary | ICD-10-CM | POA: Diagnosis not present

## 2021-07-30 DIAGNOSIS — D509 Iron deficiency anemia, unspecified: Secondary | ICD-10-CM | POA: Diagnosis not present

## 2021-08-01 DIAGNOSIS — N186 End stage renal disease: Secondary | ICD-10-CM | POA: Diagnosis not present

## 2021-08-01 DIAGNOSIS — D631 Anemia in chronic kidney disease: Secondary | ICD-10-CM | POA: Diagnosis not present

## 2021-08-01 DIAGNOSIS — Z992 Dependence on renal dialysis: Secondary | ICD-10-CM | POA: Diagnosis not present

## 2021-08-01 DIAGNOSIS — D509 Iron deficiency anemia, unspecified: Secondary | ICD-10-CM | POA: Diagnosis not present

## 2021-08-01 DIAGNOSIS — N2581 Secondary hyperparathyroidism of renal origin: Secondary | ICD-10-CM | POA: Diagnosis not present

## 2021-08-03 DIAGNOSIS — N2581 Secondary hyperparathyroidism of renal origin: Secondary | ICD-10-CM | POA: Diagnosis not present

## 2021-08-03 DIAGNOSIS — D631 Anemia in chronic kidney disease: Secondary | ICD-10-CM | POA: Diagnosis not present

## 2021-08-03 DIAGNOSIS — Z992 Dependence on renal dialysis: Secondary | ICD-10-CM | POA: Diagnosis not present

## 2021-08-03 DIAGNOSIS — D509 Iron deficiency anemia, unspecified: Secondary | ICD-10-CM | POA: Diagnosis not present

## 2021-08-03 DIAGNOSIS — N186 End stage renal disease: Secondary | ICD-10-CM | POA: Diagnosis not present

## 2021-08-06 DIAGNOSIS — D509 Iron deficiency anemia, unspecified: Secondary | ICD-10-CM | POA: Diagnosis not present

## 2021-08-06 DIAGNOSIS — N186 End stage renal disease: Secondary | ICD-10-CM | POA: Diagnosis not present

## 2021-08-06 DIAGNOSIS — Z992 Dependence on renal dialysis: Secondary | ICD-10-CM | POA: Diagnosis not present

## 2021-08-06 DIAGNOSIS — D631 Anemia in chronic kidney disease: Secondary | ICD-10-CM | POA: Diagnosis not present

## 2021-08-06 DIAGNOSIS — N2581 Secondary hyperparathyroidism of renal origin: Secondary | ICD-10-CM | POA: Diagnosis not present

## 2021-08-08 DIAGNOSIS — N2581 Secondary hyperparathyroidism of renal origin: Secondary | ICD-10-CM | POA: Diagnosis not present

## 2021-08-08 DIAGNOSIS — D631 Anemia in chronic kidney disease: Secondary | ICD-10-CM | POA: Diagnosis not present

## 2021-08-08 DIAGNOSIS — D509 Iron deficiency anemia, unspecified: Secondary | ICD-10-CM | POA: Diagnosis not present

## 2021-08-08 DIAGNOSIS — Z992 Dependence on renal dialysis: Secondary | ICD-10-CM | POA: Diagnosis not present

## 2021-08-08 DIAGNOSIS — N186 End stage renal disease: Secondary | ICD-10-CM | POA: Diagnosis not present

## 2021-08-10 DIAGNOSIS — N186 End stage renal disease: Secondary | ICD-10-CM | POA: Diagnosis not present

## 2021-08-10 DIAGNOSIS — D631 Anemia in chronic kidney disease: Secondary | ICD-10-CM | POA: Diagnosis not present

## 2021-08-10 DIAGNOSIS — N2581 Secondary hyperparathyroidism of renal origin: Secondary | ICD-10-CM | POA: Diagnosis not present

## 2021-08-10 DIAGNOSIS — Z992 Dependence on renal dialysis: Secondary | ICD-10-CM | POA: Diagnosis not present

## 2021-08-10 DIAGNOSIS — D509 Iron deficiency anemia, unspecified: Secondary | ICD-10-CM | POA: Diagnosis not present

## 2021-08-13 DIAGNOSIS — Z992 Dependence on renal dialysis: Secondary | ICD-10-CM | POA: Diagnosis not present

## 2021-08-13 DIAGNOSIS — N186 End stage renal disease: Secondary | ICD-10-CM | POA: Diagnosis not present

## 2021-08-13 DIAGNOSIS — D509 Iron deficiency anemia, unspecified: Secondary | ICD-10-CM | POA: Diagnosis not present

## 2021-08-13 DIAGNOSIS — N2581 Secondary hyperparathyroidism of renal origin: Secondary | ICD-10-CM | POA: Diagnosis not present

## 2021-08-13 DIAGNOSIS — D631 Anemia in chronic kidney disease: Secondary | ICD-10-CM | POA: Diagnosis not present

## 2021-08-16 DIAGNOSIS — R197 Diarrhea, unspecified: Secondary | ICD-10-CM | POA: Diagnosis not present

## 2021-08-16 DIAGNOSIS — Z6841 Body Mass Index (BMI) 40.0 and over, adult: Secondary | ICD-10-CM | POA: Diagnosis not present

## 2021-08-16 DIAGNOSIS — L03116 Cellulitis of left lower limb: Secondary | ICD-10-CM | POA: Diagnosis not present

## 2021-08-17 DIAGNOSIS — N2581 Secondary hyperparathyroidism of renal origin: Secondary | ICD-10-CM | POA: Diagnosis not present

## 2021-08-17 DIAGNOSIS — Z992 Dependence on renal dialysis: Secondary | ICD-10-CM | POA: Diagnosis not present

## 2021-08-17 DIAGNOSIS — D509 Iron deficiency anemia, unspecified: Secondary | ICD-10-CM | POA: Diagnosis not present

## 2021-08-17 DIAGNOSIS — D631 Anemia in chronic kidney disease: Secondary | ICD-10-CM | POA: Diagnosis not present

## 2021-08-17 DIAGNOSIS — N186 End stage renal disease: Secondary | ICD-10-CM | POA: Diagnosis not present

## 2021-08-18 DIAGNOSIS — R197 Diarrhea, unspecified: Secondary | ICD-10-CM | POA: Diagnosis not present

## 2021-08-19 DIAGNOSIS — L03116 Cellulitis of left lower limb: Secondary | ICD-10-CM | POA: Diagnosis not present

## 2021-08-19 DIAGNOSIS — J9610 Chronic respiratory failure, unspecified whether with hypoxia or hypercapnia: Secondary | ICD-10-CM | POA: Diagnosis not present

## 2021-08-19 DIAGNOSIS — Z992 Dependence on renal dialysis: Secondary | ICD-10-CM | POA: Diagnosis not present

## 2021-08-20 DIAGNOSIS — D631 Anemia in chronic kidney disease: Secondary | ICD-10-CM | POA: Diagnosis not present

## 2021-08-20 DIAGNOSIS — N186 End stage renal disease: Secondary | ICD-10-CM | POA: Diagnosis not present

## 2021-08-20 DIAGNOSIS — N2581 Secondary hyperparathyroidism of renal origin: Secondary | ICD-10-CM | POA: Diagnosis not present

## 2021-08-20 DIAGNOSIS — Z992 Dependence on renal dialysis: Secondary | ICD-10-CM | POA: Diagnosis not present

## 2021-08-20 DIAGNOSIS — D509 Iron deficiency anemia, unspecified: Secondary | ICD-10-CM | POA: Diagnosis not present

## 2021-08-22 DIAGNOSIS — Z992 Dependence on renal dialysis: Secondary | ICD-10-CM | POA: Diagnosis not present

## 2021-08-22 DIAGNOSIS — D631 Anemia in chronic kidney disease: Secondary | ICD-10-CM | POA: Diagnosis not present

## 2021-08-22 DIAGNOSIS — D509 Iron deficiency anemia, unspecified: Secondary | ICD-10-CM | POA: Diagnosis not present

## 2021-08-22 DIAGNOSIS — N186 End stage renal disease: Secondary | ICD-10-CM | POA: Diagnosis not present

## 2021-08-22 DIAGNOSIS — N2581 Secondary hyperparathyroidism of renal origin: Secondary | ICD-10-CM | POA: Diagnosis not present

## 2021-08-24 DIAGNOSIS — D631 Anemia in chronic kidney disease: Secondary | ICD-10-CM | POA: Diagnosis not present

## 2021-08-24 DIAGNOSIS — D509 Iron deficiency anemia, unspecified: Secondary | ICD-10-CM | POA: Diagnosis not present

## 2021-08-24 DIAGNOSIS — N186 End stage renal disease: Secondary | ICD-10-CM | POA: Diagnosis not present

## 2021-08-24 DIAGNOSIS — Z992 Dependence on renal dialysis: Secondary | ICD-10-CM | POA: Diagnosis not present

## 2021-08-24 DIAGNOSIS — N2581 Secondary hyperparathyroidism of renal origin: Secondary | ICD-10-CM | POA: Diagnosis not present

## 2021-08-27 DIAGNOSIS — D631 Anemia in chronic kidney disease: Secondary | ICD-10-CM | POA: Diagnosis not present

## 2021-08-27 DIAGNOSIS — Z992 Dependence on renal dialysis: Secondary | ICD-10-CM | POA: Diagnosis not present

## 2021-08-27 DIAGNOSIS — D509 Iron deficiency anemia, unspecified: Secondary | ICD-10-CM | POA: Diagnosis not present

## 2021-08-27 DIAGNOSIS — N186 End stage renal disease: Secondary | ICD-10-CM | POA: Diagnosis not present

## 2021-08-27 DIAGNOSIS — N2581 Secondary hyperparathyroidism of renal origin: Secondary | ICD-10-CM | POA: Diagnosis not present

## 2021-08-29 DIAGNOSIS — D631 Anemia in chronic kidney disease: Secondary | ICD-10-CM | POA: Diagnosis not present

## 2021-08-29 DIAGNOSIS — N2581 Secondary hyperparathyroidism of renal origin: Secondary | ICD-10-CM | POA: Diagnosis not present

## 2021-08-29 DIAGNOSIS — D509 Iron deficiency anemia, unspecified: Secondary | ICD-10-CM | POA: Diagnosis not present

## 2021-08-29 DIAGNOSIS — Z992 Dependence on renal dialysis: Secondary | ICD-10-CM | POA: Diagnosis not present

## 2021-08-29 DIAGNOSIS — N186 End stage renal disease: Secondary | ICD-10-CM | POA: Diagnosis not present

## 2021-08-31 DIAGNOSIS — N2581 Secondary hyperparathyroidism of renal origin: Secondary | ICD-10-CM | POA: Diagnosis not present

## 2021-08-31 DIAGNOSIS — D509 Iron deficiency anemia, unspecified: Secondary | ICD-10-CM | POA: Diagnosis not present

## 2021-08-31 DIAGNOSIS — N186 End stage renal disease: Secondary | ICD-10-CM | POA: Diagnosis not present

## 2021-08-31 DIAGNOSIS — Z992 Dependence on renal dialysis: Secondary | ICD-10-CM | POA: Diagnosis not present

## 2021-08-31 DIAGNOSIS — D631 Anemia in chronic kidney disease: Secondary | ICD-10-CM | POA: Diagnosis not present

## 2021-09-03 DIAGNOSIS — N186 End stage renal disease: Secondary | ICD-10-CM | POA: Diagnosis not present

## 2021-09-03 DIAGNOSIS — Z992 Dependence on renal dialysis: Secondary | ICD-10-CM | POA: Diagnosis not present

## 2021-09-03 DIAGNOSIS — D631 Anemia in chronic kidney disease: Secondary | ICD-10-CM | POA: Diagnosis not present

## 2021-09-03 DIAGNOSIS — D509 Iron deficiency anemia, unspecified: Secondary | ICD-10-CM | POA: Diagnosis not present

## 2021-09-03 DIAGNOSIS — N2581 Secondary hyperparathyroidism of renal origin: Secondary | ICD-10-CM | POA: Diagnosis not present

## 2021-09-05 DIAGNOSIS — N2581 Secondary hyperparathyroidism of renal origin: Secondary | ICD-10-CM | POA: Diagnosis not present

## 2021-09-05 DIAGNOSIS — D509 Iron deficiency anemia, unspecified: Secondary | ICD-10-CM | POA: Diagnosis not present

## 2021-09-05 DIAGNOSIS — N186 End stage renal disease: Secondary | ICD-10-CM | POA: Diagnosis not present

## 2021-09-05 DIAGNOSIS — Z992 Dependence on renal dialysis: Secondary | ICD-10-CM | POA: Diagnosis not present

## 2021-09-05 DIAGNOSIS — D631 Anemia in chronic kidney disease: Secondary | ICD-10-CM | POA: Diagnosis not present

## 2021-09-07 DIAGNOSIS — Z992 Dependence on renal dialysis: Secondary | ICD-10-CM | POA: Diagnosis not present

## 2021-09-07 DIAGNOSIS — D509 Iron deficiency anemia, unspecified: Secondary | ICD-10-CM | POA: Diagnosis not present

## 2021-09-07 DIAGNOSIS — N186 End stage renal disease: Secondary | ICD-10-CM | POA: Diagnosis not present

## 2021-09-07 DIAGNOSIS — D631 Anemia in chronic kidney disease: Secondary | ICD-10-CM | POA: Diagnosis not present

## 2021-09-07 DIAGNOSIS — N2581 Secondary hyperparathyroidism of renal origin: Secondary | ICD-10-CM | POA: Diagnosis not present

## 2021-09-10 DIAGNOSIS — N186 End stage renal disease: Secondary | ICD-10-CM | POA: Diagnosis not present

## 2021-09-10 DIAGNOSIS — N2581 Secondary hyperparathyroidism of renal origin: Secondary | ICD-10-CM | POA: Diagnosis not present

## 2021-09-10 DIAGNOSIS — D631 Anemia in chronic kidney disease: Secondary | ICD-10-CM | POA: Diagnosis not present

## 2021-09-10 DIAGNOSIS — D509 Iron deficiency anemia, unspecified: Secondary | ICD-10-CM | POA: Diagnosis not present

## 2021-09-10 DIAGNOSIS — Z992 Dependence on renal dialysis: Secondary | ICD-10-CM | POA: Diagnosis not present

## 2021-09-12 DIAGNOSIS — D631 Anemia in chronic kidney disease: Secondary | ICD-10-CM | POA: Diagnosis not present

## 2021-09-12 DIAGNOSIS — D509 Iron deficiency anemia, unspecified: Secondary | ICD-10-CM | POA: Diagnosis not present

## 2021-09-12 DIAGNOSIS — Z992 Dependence on renal dialysis: Secondary | ICD-10-CM | POA: Diagnosis not present

## 2021-09-12 DIAGNOSIS — N2581 Secondary hyperparathyroidism of renal origin: Secondary | ICD-10-CM | POA: Diagnosis not present

## 2021-09-12 DIAGNOSIS — N186 End stage renal disease: Secondary | ICD-10-CM | POA: Diagnosis not present

## 2021-09-13 DIAGNOSIS — Z20822 Contact with and (suspected) exposure to covid-19: Secondary | ICD-10-CM | POA: Diagnosis not present

## 2021-09-14 DIAGNOSIS — D631 Anemia in chronic kidney disease: Secondary | ICD-10-CM | POA: Diagnosis not present

## 2021-09-14 DIAGNOSIS — D509 Iron deficiency anemia, unspecified: Secondary | ICD-10-CM | POA: Diagnosis not present

## 2021-09-14 DIAGNOSIS — N2581 Secondary hyperparathyroidism of renal origin: Secondary | ICD-10-CM | POA: Diagnosis not present

## 2021-09-14 DIAGNOSIS — N186 End stage renal disease: Secondary | ICD-10-CM | POA: Diagnosis not present

## 2021-09-14 DIAGNOSIS — Z992 Dependence on renal dialysis: Secondary | ICD-10-CM | POA: Diagnosis not present

## 2021-09-15 DIAGNOSIS — Z20822 Contact with and (suspected) exposure to covid-19: Secondary | ICD-10-CM | POA: Diagnosis not present

## 2021-09-19 DIAGNOSIS — Z992 Dependence on renal dialysis: Secondary | ICD-10-CM | POA: Diagnosis not present

## 2021-09-19 DIAGNOSIS — D631 Anemia in chronic kidney disease: Secondary | ICD-10-CM | POA: Diagnosis not present

## 2021-09-19 DIAGNOSIS — D509 Iron deficiency anemia, unspecified: Secondary | ICD-10-CM | POA: Diagnosis not present

## 2021-09-19 DIAGNOSIS — N2581 Secondary hyperparathyroidism of renal origin: Secondary | ICD-10-CM | POA: Diagnosis not present

## 2021-09-19 DIAGNOSIS — N186 End stage renal disease: Secondary | ICD-10-CM | POA: Diagnosis not present

## 2021-09-21 DIAGNOSIS — D509 Iron deficiency anemia, unspecified: Secondary | ICD-10-CM | POA: Diagnosis not present

## 2021-09-21 DIAGNOSIS — D631 Anemia in chronic kidney disease: Secondary | ICD-10-CM | POA: Diagnosis not present

## 2021-09-21 DIAGNOSIS — Z992 Dependence on renal dialysis: Secondary | ICD-10-CM | POA: Diagnosis not present

## 2021-09-21 DIAGNOSIS — N186 End stage renal disease: Secondary | ICD-10-CM | POA: Diagnosis not present

## 2021-09-21 DIAGNOSIS — N2581 Secondary hyperparathyroidism of renal origin: Secondary | ICD-10-CM | POA: Diagnosis not present

## 2021-09-24 DIAGNOSIS — Z992 Dependence on renal dialysis: Secondary | ICD-10-CM | POA: Diagnosis not present

## 2021-09-24 DIAGNOSIS — D509 Iron deficiency anemia, unspecified: Secondary | ICD-10-CM | POA: Diagnosis not present

## 2021-09-24 DIAGNOSIS — N186 End stage renal disease: Secondary | ICD-10-CM | POA: Diagnosis not present

## 2021-09-24 DIAGNOSIS — N2581 Secondary hyperparathyroidism of renal origin: Secondary | ICD-10-CM | POA: Diagnosis not present

## 2021-09-24 DIAGNOSIS — D631 Anemia in chronic kidney disease: Secondary | ICD-10-CM | POA: Diagnosis not present

## 2021-09-26 DIAGNOSIS — N2581 Secondary hyperparathyroidism of renal origin: Secondary | ICD-10-CM | POA: Diagnosis not present

## 2021-09-26 DIAGNOSIS — N186 End stage renal disease: Secondary | ICD-10-CM | POA: Diagnosis not present

## 2021-09-26 DIAGNOSIS — D509 Iron deficiency anemia, unspecified: Secondary | ICD-10-CM | POA: Diagnosis not present

## 2021-09-26 DIAGNOSIS — Z992 Dependence on renal dialysis: Secondary | ICD-10-CM | POA: Diagnosis not present

## 2021-09-26 DIAGNOSIS — D631 Anemia in chronic kidney disease: Secondary | ICD-10-CM | POA: Diagnosis not present

## 2021-09-27 DIAGNOSIS — N186 End stage renal disease: Secondary | ICD-10-CM | POA: Diagnosis not present

## 2021-09-27 DIAGNOSIS — Z992 Dependence on renal dialysis: Secondary | ICD-10-CM | POA: Diagnosis not present

## 2021-09-28 DIAGNOSIS — N2581 Secondary hyperparathyroidism of renal origin: Secondary | ICD-10-CM | POA: Diagnosis not present

## 2021-09-28 DIAGNOSIS — N186 End stage renal disease: Secondary | ICD-10-CM | POA: Diagnosis not present

## 2021-09-28 DIAGNOSIS — D509 Iron deficiency anemia, unspecified: Secondary | ICD-10-CM | POA: Diagnosis not present

## 2021-09-28 DIAGNOSIS — D631 Anemia in chronic kidney disease: Secondary | ICD-10-CM | POA: Diagnosis not present

## 2021-09-28 DIAGNOSIS — N250 Renal osteodystrophy: Secondary | ICD-10-CM | POA: Diagnosis not present

## 2021-09-28 DIAGNOSIS — Z992 Dependence on renal dialysis: Secondary | ICD-10-CM | POA: Diagnosis not present

## 2021-10-01 DIAGNOSIS — N250 Renal osteodystrophy: Secondary | ICD-10-CM | POA: Diagnosis not present

## 2021-10-01 DIAGNOSIS — N186 End stage renal disease: Secondary | ICD-10-CM | POA: Diagnosis not present

## 2021-10-01 DIAGNOSIS — D509 Iron deficiency anemia, unspecified: Secondary | ICD-10-CM | POA: Diagnosis not present

## 2021-10-01 DIAGNOSIS — D631 Anemia in chronic kidney disease: Secondary | ICD-10-CM | POA: Diagnosis not present

## 2021-10-01 DIAGNOSIS — Z992 Dependence on renal dialysis: Secondary | ICD-10-CM | POA: Diagnosis not present

## 2021-10-01 DIAGNOSIS — N2581 Secondary hyperparathyroidism of renal origin: Secondary | ICD-10-CM | POA: Diagnosis not present

## 2021-10-03 DIAGNOSIS — M62830 Muscle spasm of back: Secondary | ICD-10-CM | POA: Diagnosis not present

## 2021-10-05 DIAGNOSIS — N186 End stage renal disease: Secondary | ICD-10-CM | POA: Diagnosis not present

## 2021-10-05 DIAGNOSIS — D631 Anemia in chronic kidney disease: Secondary | ICD-10-CM | POA: Diagnosis not present

## 2021-10-05 DIAGNOSIS — Z992 Dependence on renal dialysis: Secondary | ICD-10-CM | POA: Diagnosis not present

## 2021-10-05 DIAGNOSIS — N250 Renal osteodystrophy: Secondary | ICD-10-CM | POA: Diagnosis not present

## 2021-10-05 DIAGNOSIS — N2581 Secondary hyperparathyroidism of renal origin: Secondary | ICD-10-CM | POA: Diagnosis not present

## 2021-10-05 DIAGNOSIS — D509 Iron deficiency anemia, unspecified: Secondary | ICD-10-CM | POA: Diagnosis not present

## 2021-10-07 DIAGNOSIS — Z20822 Contact with and (suspected) exposure to covid-19: Secondary | ICD-10-CM | POA: Diagnosis not present

## 2021-10-08 DIAGNOSIS — N186 End stage renal disease: Secondary | ICD-10-CM | POA: Diagnosis not present

## 2021-10-08 DIAGNOSIS — N2581 Secondary hyperparathyroidism of renal origin: Secondary | ICD-10-CM | POA: Diagnosis not present

## 2021-10-08 DIAGNOSIS — D631 Anemia in chronic kidney disease: Secondary | ICD-10-CM | POA: Diagnosis not present

## 2021-10-08 DIAGNOSIS — D509 Iron deficiency anemia, unspecified: Secondary | ICD-10-CM | POA: Diagnosis not present

## 2021-10-08 DIAGNOSIS — N250 Renal osteodystrophy: Secondary | ICD-10-CM | POA: Diagnosis not present

## 2021-10-08 DIAGNOSIS — Z992 Dependence on renal dialysis: Secondary | ICD-10-CM | POA: Diagnosis not present

## 2021-10-10 DIAGNOSIS — N186 End stage renal disease: Secondary | ICD-10-CM | POA: Diagnosis not present

## 2021-10-10 DIAGNOSIS — D509 Iron deficiency anemia, unspecified: Secondary | ICD-10-CM | POA: Diagnosis not present

## 2021-10-10 DIAGNOSIS — N250 Renal osteodystrophy: Secondary | ICD-10-CM | POA: Diagnosis not present

## 2021-10-10 DIAGNOSIS — D631 Anemia in chronic kidney disease: Secondary | ICD-10-CM | POA: Diagnosis not present

## 2021-10-10 DIAGNOSIS — Z992 Dependence on renal dialysis: Secondary | ICD-10-CM | POA: Diagnosis not present

## 2021-10-10 DIAGNOSIS — N2581 Secondary hyperparathyroidism of renal origin: Secondary | ICD-10-CM | POA: Diagnosis not present

## 2021-10-12 DIAGNOSIS — N250 Renal osteodystrophy: Secondary | ICD-10-CM | POA: Diagnosis not present

## 2021-10-12 DIAGNOSIS — Z992 Dependence on renal dialysis: Secondary | ICD-10-CM | POA: Diagnosis not present

## 2021-10-12 DIAGNOSIS — N2581 Secondary hyperparathyroidism of renal origin: Secondary | ICD-10-CM | POA: Diagnosis not present

## 2021-10-12 DIAGNOSIS — N186 End stage renal disease: Secondary | ICD-10-CM | POA: Diagnosis not present

## 2021-10-12 DIAGNOSIS — D631 Anemia in chronic kidney disease: Secondary | ICD-10-CM | POA: Diagnosis not present

## 2021-10-12 DIAGNOSIS — D509 Iron deficiency anemia, unspecified: Secondary | ICD-10-CM | POA: Diagnosis not present

## 2021-10-15 DIAGNOSIS — D631 Anemia in chronic kidney disease: Secondary | ICD-10-CM | POA: Diagnosis not present

## 2021-10-15 DIAGNOSIS — N250 Renal osteodystrophy: Secondary | ICD-10-CM | POA: Diagnosis not present

## 2021-10-15 DIAGNOSIS — D509 Iron deficiency anemia, unspecified: Secondary | ICD-10-CM | POA: Diagnosis not present

## 2021-10-15 DIAGNOSIS — N2581 Secondary hyperparathyroidism of renal origin: Secondary | ICD-10-CM | POA: Diagnosis not present

## 2021-10-15 DIAGNOSIS — Z992 Dependence on renal dialysis: Secondary | ICD-10-CM | POA: Diagnosis not present

## 2021-10-15 DIAGNOSIS — N186 End stage renal disease: Secondary | ICD-10-CM | POA: Diagnosis not present

## 2021-10-16 DIAGNOSIS — I120 Hypertensive chronic kidney disease with stage 5 chronic kidney disease or end stage renal disease: Secondary | ICD-10-CM | POA: Diagnosis not present

## 2021-10-16 DIAGNOSIS — Z992 Dependence on renal dialysis: Secondary | ICD-10-CM | POA: Diagnosis not present

## 2021-10-16 DIAGNOSIS — Z79899 Other long term (current) drug therapy: Secondary | ICD-10-CM | POA: Diagnosis not present

## 2021-10-16 DIAGNOSIS — M109 Gout, unspecified: Secondary | ICD-10-CM | POA: Diagnosis not present

## 2021-10-16 DIAGNOSIS — N186 End stage renal disease: Secondary | ICD-10-CM | POA: Diagnosis not present

## 2021-10-16 DIAGNOSIS — T82898A Other specified complication of vascular prosthetic devices, implants and grafts, initial encounter: Secondary | ICD-10-CM | POA: Diagnosis not present

## 2021-10-16 DIAGNOSIS — D631 Anemia in chronic kidney disease: Secondary | ICD-10-CM | POA: Diagnosis not present

## 2021-10-16 DIAGNOSIS — K219 Gastro-esophageal reflux disease without esophagitis: Secondary | ICD-10-CM | POA: Diagnosis not present

## 2021-10-16 DIAGNOSIS — Z86711 Personal history of pulmonary embolism: Secondary | ICD-10-CM | POA: Diagnosis not present

## 2021-10-17 DIAGNOSIS — D631 Anemia in chronic kidney disease: Secondary | ICD-10-CM | POA: Diagnosis not present

## 2021-10-17 DIAGNOSIS — N186 End stage renal disease: Secondary | ICD-10-CM | POA: Diagnosis not present

## 2021-10-17 DIAGNOSIS — D509 Iron deficiency anemia, unspecified: Secondary | ICD-10-CM | POA: Diagnosis not present

## 2021-10-17 DIAGNOSIS — Z992 Dependence on renal dialysis: Secondary | ICD-10-CM | POA: Diagnosis not present

## 2021-10-17 DIAGNOSIS — N250 Renal osteodystrophy: Secondary | ICD-10-CM | POA: Diagnosis not present

## 2021-10-17 DIAGNOSIS — N2581 Secondary hyperparathyroidism of renal origin: Secondary | ICD-10-CM | POA: Diagnosis not present

## 2021-10-19 DIAGNOSIS — D509 Iron deficiency anemia, unspecified: Secondary | ICD-10-CM | POA: Diagnosis not present

## 2021-10-19 DIAGNOSIS — N250 Renal osteodystrophy: Secondary | ICD-10-CM | POA: Diagnosis not present

## 2021-10-19 DIAGNOSIS — N186 End stage renal disease: Secondary | ICD-10-CM | POA: Diagnosis not present

## 2021-10-19 DIAGNOSIS — Z992 Dependence on renal dialysis: Secondary | ICD-10-CM | POA: Diagnosis not present

## 2021-10-19 DIAGNOSIS — N2581 Secondary hyperparathyroidism of renal origin: Secondary | ICD-10-CM | POA: Diagnosis not present

## 2021-10-19 DIAGNOSIS — D631 Anemia in chronic kidney disease: Secondary | ICD-10-CM | POA: Diagnosis not present

## 2021-10-21 DIAGNOSIS — M4854XA Collapsed vertebra, not elsewhere classified, thoracic region, initial encounter for fracture: Secondary | ICD-10-CM | POA: Diagnosis not present

## 2021-10-21 DIAGNOSIS — M47816 Spondylosis without myelopathy or radiculopathy, lumbar region: Secondary | ICD-10-CM | POA: Diagnosis not present

## 2021-10-21 DIAGNOSIS — S22000A Wedge compression fracture of unspecified thoracic vertebra, initial encounter for closed fracture: Secondary | ICD-10-CM | POA: Diagnosis not present

## 2021-10-21 DIAGNOSIS — M5134 Other intervertebral disc degeneration, thoracic region: Secondary | ICD-10-CM | POA: Diagnosis not present

## 2021-10-21 DIAGNOSIS — M5125 Other intervertebral disc displacement, thoracolumbar region: Secondary | ICD-10-CM | POA: Diagnosis not present

## 2021-10-21 DIAGNOSIS — K219 Gastro-esophageal reflux disease without esophagitis: Secondary | ICD-10-CM | POA: Diagnosis not present

## 2021-10-21 DIAGNOSIS — S22018A Other fracture of first thoracic vertebra, initial encounter for closed fracture: Secondary | ICD-10-CM | POA: Diagnosis not present

## 2021-10-21 DIAGNOSIS — M5136 Other intervertebral disc degeneration, lumbar region: Secondary | ICD-10-CM | POA: Diagnosis not present

## 2021-10-21 DIAGNOSIS — Z9049 Acquired absence of other specified parts of digestive tract: Secondary | ICD-10-CM | POA: Diagnosis not present

## 2021-10-21 DIAGNOSIS — Z9089 Acquired absence of other organs: Secondary | ICD-10-CM | POA: Diagnosis not present

## 2021-10-21 DIAGNOSIS — G2581 Restless legs syndrome: Secondary | ICD-10-CM | POA: Diagnosis not present

## 2021-10-21 DIAGNOSIS — N250 Renal osteodystrophy: Secondary | ICD-10-CM | POA: Insufficient documentation

## 2021-10-21 DIAGNOSIS — M5126 Other intervertebral disc displacement, lumbar region: Secondary | ICD-10-CM | POA: Diagnosis not present

## 2021-10-21 DIAGNOSIS — S22070A Wedge compression fracture of T9-T10 vertebra, initial encounter for closed fracture: Secondary | ICD-10-CM | POA: Diagnosis not present

## 2021-10-21 DIAGNOSIS — I10 Essential (primary) hypertension: Secondary | ICD-10-CM | POA: Diagnosis not present

## 2021-10-21 DIAGNOSIS — Z992 Dependence on renal dialysis: Secondary | ICD-10-CM | POA: Diagnosis not present

## 2021-10-21 DIAGNOSIS — D638 Anemia in other chronic diseases classified elsewhere: Secondary | ICD-10-CM | POA: Diagnosis not present

## 2021-10-21 DIAGNOSIS — R0602 Shortness of breath: Secondary | ICD-10-CM | POA: Diagnosis not present

## 2021-10-21 DIAGNOSIS — S22078A Other fracture of T9-T10 vertebra, initial encounter for closed fracture: Secondary | ICD-10-CM | POA: Diagnosis not present

## 2021-10-21 DIAGNOSIS — N261 Atrophy of kidney (terminal): Secondary | ICD-10-CM | POA: Diagnosis not present

## 2021-10-21 DIAGNOSIS — M47814 Spondylosis without myelopathy or radiculopathy, thoracic region: Secondary | ICD-10-CM | POA: Diagnosis not present

## 2021-10-21 DIAGNOSIS — M4856XA Collapsed vertebra, not elsewhere classified, lumbar region, initial encounter for fracture: Secondary | ICD-10-CM | POA: Diagnosis not present

## 2021-10-21 DIAGNOSIS — N186 End stage renal disease: Secondary | ICD-10-CM | POA: Diagnosis not present

## 2021-10-21 DIAGNOSIS — R109 Unspecified abdominal pain: Secondary | ICD-10-CM | POA: Diagnosis not present

## 2021-10-22 DIAGNOSIS — R29890 Loss of height: Secondary | ICD-10-CM | POA: Diagnosis not present

## 2021-10-22 DIAGNOSIS — Z992 Dependence on renal dialysis: Secondary | ICD-10-CM | POA: Diagnosis not present

## 2021-10-22 DIAGNOSIS — N186 End stage renal disease: Secondary | ICD-10-CM | POA: Diagnosis not present

## 2021-10-22 DIAGNOSIS — M4854XA Collapsed vertebra, not elsewhere classified, thoracic region, initial encounter for fracture: Secondary | ICD-10-CM | POA: Diagnosis not present

## 2021-10-22 DIAGNOSIS — S22070A Wedge compression fracture of T9-T10 vertebra, initial encounter for closed fracture: Secondary | ICD-10-CM | POA: Diagnosis not present

## 2021-10-23 DIAGNOSIS — N186 End stage renal disease: Secondary | ICD-10-CM | POA: Diagnosis not present

## 2021-10-23 DIAGNOSIS — D631 Anemia in chronic kidney disease: Secondary | ICD-10-CM | POA: Diagnosis not present

## 2021-10-23 DIAGNOSIS — N250 Renal osteodystrophy: Secondary | ICD-10-CM | POA: Diagnosis not present

## 2021-10-23 DIAGNOSIS — S22070A Wedge compression fracture of T9-T10 vertebra, initial encounter for closed fracture: Secondary | ICD-10-CM | POA: Diagnosis not present

## 2021-10-23 DIAGNOSIS — D638 Anemia in other chronic diseases classified elsewhere: Secondary | ICD-10-CM | POA: Diagnosis not present

## 2021-10-23 DIAGNOSIS — Z992 Dependence on renal dialysis: Secondary | ICD-10-CM | POA: Diagnosis not present

## 2021-10-23 DIAGNOSIS — K219 Gastro-esophageal reflux disease without esophagitis: Secondary | ICD-10-CM | POA: Diagnosis not present

## 2021-10-23 DIAGNOSIS — S22000A Wedge compression fracture of unspecified thoracic vertebra, initial encounter for closed fracture: Secondary | ICD-10-CM | POA: Diagnosis not present

## 2021-10-23 DIAGNOSIS — I10 Essential (primary) hypertension: Secondary | ICD-10-CM | POA: Diagnosis not present

## 2021-10-23 DIAGNOSIS — G2581 Restless legs syndrome: Secondary | ICD-10-CM | POA: Diagnosis not present

## 2021-10-24 DIAGNOSIS — N186 End stage renal disease: Secondary | ICD-10-CM | POA: Diagnosis not present

## 2021-10-24 DIAGNOSIS — D631 Anemia in chronic kidney disease: Secondary | ICD-10-CM | POA: Diagnosis not present

## 2021-10-24 DIAGNOSIS — I10 Essential (primary) hypertension: Secondary | ICD-10-CM | POA: Diagnosis not present

## 2021-10-24 DIAGNOSIS — D638 Anemia in other chronic diseases classified elsewhere: Secondary | ICD-10-CM | POA: Diagnosis not present

## 2021-10-24 DIAGNOSIS — Z992 Dependence on renal dialysis: Secondary | ICD-10-CM | POA: Diagnosis not present

## 2021-10-24 DIAGNOSIS — K219 Gastro-esophageal reflux disease without esophagitis: Secondary | ICD-10-CM | POA: Diagnosis not present

## 2021-10-24 DIAGNOSIS — S22070A Wedge compression fracture of T9-T10 vertebra, initial encounter for closed fracture: Secondary | ICD-10-CM | POA: Diagnosis not present

## 2021-10-24 DIAGNOSIS — G2581 Restless legs syndrome: Secondary | ICD-10-CM | POA: Diagnosis not present

## 2021-10-24 DIAGNOSIS — N250 Renal osteodystrophy: Secondary | ICD-10-CM | POA: Diagnosis not present

## 2021-10-25 DIAGNOSIS — S22070A Wedge compression fracture of T9-T10 vertebra, initial encounter for closed fracture: Secondary | ICD-10-CM | POA: Diagnosis not present

## 2021-10-25 DIAGNOSIS — I10 Essential (primary) hypertension: Secondary | ICD-10-CM | POA: Diagnosis not present

## 2021-10-25 DIAGNOSIS — Z992 Dependence on renal dialysis: Secondary | ICD-10-CM | POA: Diagnosis not present

## 2021-10-25 DIAGNOSIS — N250 Renal osteodystrophy: Secondary | ICD-10-CM | POA: Diagnosis not present

## 2021-10-25 DIAGNOSIS — K219 Gastro-esophageal reflux disease without esophagitis: Secondary | ICD-10-CM | POA: Diagnosis not present

## 2021-10-25 DIAGNOSIS — D638 Anemia in other chronic diseases classified elsewhere: Secondary | ICD-10-CM | POA: Diagnosis not present

## 2021-10-25 DIAGNOSIS — N186 End stage renal disease: Secondary | ICD-10-CM | POA: Diagnosis not present

## 2021-10-25 DIAGNOSIS — G2581 Restless legs syndrome: Secondary | ICD-10-CM | POA: Diagnosis not present

## 2021-10-25 DIAGNOSIS — D631 Anemia in chronic kidney disease: Secondary | ICD-10-CM | POA: Diagnosis not present

## 2021-10-26 DIAGNOSIS — N250 Renal osteodystrophy: Secondary | ICD-10-CM | POA: Diagnosis not present

## 2021-10-26 DIAGNOSIS — D509 Iron deficiency anemia, unspecified: Secondary | ICD-10-CM | POA: Diagnosis not present

## 2021-10-26 DIAGNOSIS — Z992 Dependence on renal dialysis: Secondary | ICD-10-CM | POA: Diagnosis not present

## 2021-10-26 DIAGNOSIS — D631 Anemia in chronic kidney disease: Secondary | ICD-10-CM | POA: Diagnosis not present

## 2021-10-26 DIAGNOSIS — N2581 Secondary hyperparathyroidism of renal origin: Secondary | ICD-10-CM | POA: Diagnosis not present

## 2021-10-26 DIAGNOSIS — N186 End stage renal disease: Secondary | ICD-10-CM | POA: Diagnosis not present

## 2021-10-27 DIAGNOSIS — Z992 Dependence on renal dialysis: Secondary | ICD-10-CM | POA: Diagnosis not present

## 2021-10-27 DIAGNOSIS — N186 End stage renal disease: Secondary | ICD-10-CM | POA: Diagnosis not present

## 2021-10-28 DIAGNOSIS — Z20822 Contact with and (suspected) exposure to covid-19: Secondary | ICD-10-CM | POA: Diagnosis not present

## 2021-10-29 DIAGNOSIS — N250 Renal osteodystrophy: Secondary | ICD-10-CM | POA: Diagnosis not present

## 2021-10-29 DIAGNOSIS — N186 End stage renal disease: Secondary | ICD-10-CM | POA: Diagnosis not present

## 2021-10-29 DIAGNOSIS — Z992 Dependence on renal dialysis: Secondary | ICD-10-CM | POA: Diagnosis not present

## 2021-10-29 DIAGNOSIS — D631 Anemia in chronic kidney disease: Secondary | ICD-10-CM | POA: Diagnosis not present

## 2021-10-29 DIAGNOSIS — D509 Iron deficiency anemia, unspecified: Secondary | ICD-10-CM | POA: Diagnosis not present

## 2021-10-29 DIAGNOSIS — N2581 Secondary hyperparathyroidism of renal origin: Secondary | ICD-10-CM | POA: Diagnosis not present

## 2021-10-30 DIAGNOSIS — K219 Gastro-esophageal reflux disease without esophagitis: Secondary | ICD-10-CM | POA: Diagnosis not present

## 2021-10-30 DIAGNOSIS — M1A9XX0 Chronic gout, unspecified, without tophus (tophi): Secondary | ICD-10-CM | POA: Diagnosis not present

## 2021-10-30 DIAGNOSIS — D631 Anemia in chronic kidney disease: Secondary | ICD-10-CM | POA: Diagnosis not present

## 2021-10-30 DIAGNOSIS — M1712 Unilateral primary osteoarthritis, left knee: Secondary | ICD-10-CM | POA: Diagnosis not present

## 2021-10-30 DIAGNOSIS — Z79899 Other long term (current) drug therapy: Secondary | ICD-10-CM | POA: Diagnosis not present

## 2021-10-30 DIAGNOSIS — Z86711 Personal history of pulmonary embolism: Secondary | ICD-10-CM | POA: Diagnosis not present

## 2021-10-30 DIAGNOSIS — N250 Renal osteodystrophy: Secondary | ICD-10-CM | POA: Diagnosis not present

## 2021-10-30 DIAGNOSIS — Z992 Dependence on renal dialysis: Secondary | ICD-10-CM | POA: Diagnosis not present

## 2021-10-30 DIAGNOSIS — M4854XD Collapsed vertebra, not elsewhere classified, thoracic region, subsequent encounter for fracture with routine healing: Secondary | ICD-10-CM | POA: Diagnosis not present

## 2021-10-30 DIAGNOSIS — N186 End stage renal disease: Secondary | ICD-10-CM | POA: Diagnosis not present

## 2021-10-30 DIAGNOSIS — G2581 Restless legs syndrome: Secondary | ICD-10-CM | POA: Diagnosis not present

## 2021-10-30 DIAGNOSIS — I120 Hypertensive chronic kidney disease with stage 5 chronic kidney disease or end stage renal disease: Secondary | ICD-10-CM | POA: Diagnosis not present

## 2021-10-31 DIAGNOSIS — Z992 Dependence on renal dialysis: Secondary | ICD-10-CM | POA: Diagnosis not present

## 2021-10-31 DIAGNOSIS — N2581 Secondary hyperparathyroidism of renal origin: Secondary | ICD-10-CM | POA: Diagnosis not present

## 2021-10-31 DIAGNOSIS — D509 Iron deficiency anemia, unspecified: Secondary | ICD-10-CM | POA: Diagnosis not present

## 2021-10-31 DIAGNOSIS — Z7689 Persons encountering health services in other specified circumstances: Secondary | ICD-10-CM | POA: Diagnosis not present

## 2021-10-31 DIAGNOSIS — N250 Renal osteodystrophy: Secondary | ICD-10-CM | POA: Diagnosis not present

## 2021-10-31 DIAGNOSIS — D631 Anemia in chronic kidney disease: Secondary | ICD-10-CM | POA: Diagnosis not present

## 2021-10-31 DIAGNOSIS — Z20828 Contact with and (suspected) exposure to other viral communicable diseases: Secondary | ICD-10-CM | POA: Diagnosis not present

## 2021-10-31 DIAGNOSIS — S22000A Wedge compression fracture of unspecified thoracic vertebra, initial encounter for closed fracture: Secondary | ICD-10-CM | POA: Diagnosis not present

## 2021-10-31 DIAGNOSIS — N186 End stage renal disease: Secondary | ICD-10-CM | POA: Diagnosis not present

## 2021-10-31 DIAGNOSIS — Z1152 Encounter for screening for COVID-19: Secondary | ICD-10-CM | POA: Diagnosis not present

## 2021-11-02 DIAGNOSIS — D631 Anemia in chronic kidney disease: Secondary | ICD-10-CM | POA: Diagnosis not present

## 2021-11-02 DIAGNOSIS — N250 Renal osteodystrophy: Secondary | ICD-10-CM | POA: Diagnosis not present

## 2021-11-02 DIAGNOSIS — D509 Iron deficiency anemia, unspecified: Secondary | ICD-10-CM | POA: Diagnosis not present

## 2021-11-02 DIAGNOSIS — N2581 Secondary hyperparathyroidism of renal origin: Secondary | ICD-10-CM | POA: Diagnosis not present

## 2021-11-02 DIAGNOSIS — N186 End stage renal disease: Secondary | ICD-10-CM | POA: Diagnosis not present

## 2021-11-02 DIAGNOSIS — Z992 Dependence on renal dialysis: Secondary | ICD-10-CM | POA: Diagnosis not present

## 2021-11-05 DIAGNOSIS — D509 Iron deficiency anemia, unspecified: Secondary | ICD-10-CM | POA: Diagnosis not present

## 2021-11-05 DIAGNOSIS — N2581 Secondary hyperparathyroidism of renal origin: Secondary | ICD-10-CM | POA: Diagnosis not present

## 2021-11-05 DIAGNOSIS — Z992 Dependence on renal dialysis: Secondary | ICD-10-CM | POA: Diagnosis not present

## 2021-11-05 DIAGNOSIS — N186 End stage renal disease: Secondary | ICD-10-CM | POA: Diagnosis not present

## 2021-11-05 DIAGNOSIS — D631 Anemia in chronic kidney disease: Secondary | ICD-10-CM | POA: Diagnosis not present

## 2021-11-05 DIAGNOSIS — N250 Renal osteodystrophy: Secondary | ICD-10-CM | POA: Diagnosis not present

## 2021-11-06 DIAGNOSIS — M1A9XX0 Chronic gout, unspecified, without tophus (tophi): Secondary | ICD-10-CM | POA: Diagnosis not present

## 2021-11-06 DIAGNOSIS — N186 End stage renal disease: Secondary | ICD-10-CM | POA: Diagnosis not present

## 2021-11-06 DIAGNOSIS — D631 Anemia in chronic kidney disease: Secondary | ICD-10-CM | POA: Diagnosis not present

## 2021-11-06 DIAGNOSIS — M4854XD Collapsed vertebra, not elsewhere classified, thoracic region, subsequent encounter for fracture with routine healing: Secondary | ICD-10-CM | POA: Diagnosis not present

## 2021-11-06 DIAGNOSIS — G2581 Restless legs syndrome: Secondary | ICD-10-CM | POA: Diagnosis not present

## 2021-11-06 DIAGNOSIS — I120 Hypertensive chronic kidney disease with stage 5 chronic kidney disease or end stage renal disease: Secondary | ICD-10-CM | POA: Diagnosis not present

## 2021-11-07 DIAGNOSIS — D631 Anemia in chronic kidney disease: Secondary | ICD-10-CM | POA: Diagnosis not present

## 2021-11-07 DIAGNOSIS — N186 End stage renal disease: Secondary | ICD-10-CM | POA: Diagnosis not present

## 2021-11-07 DIAGNOSIS — Z992 Dependence on renal dialysis: Secondary | ICD-10-CM | POA: Diagnosis not present

## 2021-11-07 DIAGNOSIS — G2581 Restless legs syndrome: Secondary | ICD-10-CM | POA: Diagnosis not present

## 2021-11-07 DIAGNOSIS — D509 Iron deficiency anemia, unspecified: Secondary | ICD-10-CM | POA: Diagnosis not present

## 2021-11-07 DIAGNOSIS — N2581 Secondary hyperparathyroidism of renal origin: Secondary | ICD-10-CM | POA: Diagnosis not present

## 2021-11-07 DIAGNOSIS — M1A9XX0 Chronic gout, unspecified, without tophus (tophi): Secondary | ICD-10-CM | POA: Diagnosis not present

## 2021-11-07 DIAGNOSIS — M4854XD Collapsed vertebra, not elsewhere classified, thoracic region, subsequent encounter for fracture with routine healing: Secondary | ICD-10-CM | POA: Diagnosis not present

## 2021-11-07 DIAGNOSIS — N250 Renal osteodystrophy: Secondary | ICD-10-CM | POA: Diagnosis not present

## 2021-11-07 DIAGNOSIS — I120 Hypertensive chronic kidney disease with stage 5 chronic kidney disease or end stage renal disease: Secondary | ICD-10-CM | POA: Diagnosis not present

## 2021-11-09 DIAGNOSIS — S22000A Wedge compression fracture of unspecified thoracic vertebra, initial encounter for closed fracture: Secondary | ICD-10-CM | POA: Diagnosis not present

## 2021-11-09 DIAGNOSIS — N250 Renal osteodystrophy: Secondary | ICD-10-CM | POA: Diagnosis not present

## 2021-11-09 DIAGNOSIS — M898X9 Other specified disorders of bone, unspecified site: Secondary | ICD-10-CM | POA: Diagnosis not present

## 2021-11-11 DIAGNOSIS — N250 Renal osteodystrophy: Secondary | ICD-10-CM | POA: Diagnosis not present

## 2021-11-11 DIAGNOSIS — S22070D Wedge compression fracture of T9-T10 vertebra, subsequent encounter for fracture with routine healing: Secondary | ICD-10-CM | POA: Diagnosis not present

## 2021-11-12 DIAGNOSIS — D509 Iron deficiency anemia, unspecified: Secondary | ICD-10-CM | POA: Diagnosis not present

## 2021-11-12 DIAGNOSIS — N2581 Secondary hyperparathyroidism of renal origin: Secondary | ICD-10-CM | POA: Diagnosis not present

## 2021-11-12 DIAGNOSIS — N250 Renal osteodystrophy: Secondary | ICD-10-CM | POA: Diagnosis not present

## 2021-11-12 DIAGNOSIS — N186 End stage renal disease: Secondary | ICD-10-CM | POA: Diagnosis not present

## 2021-11-12 DIAGNOSIS — D631 Anemia in chronic kidney disease: Secondary | ICD-10-CM | POA: Diagnosis not present

## 2021-11-12 DIAGNOSIS — Z992 Dependence on renal dialysis: Secondary | ICD-10-CM | POA: Diagnosis not present

## 2021-11-13 DIAGNOSIS — G2581 Restless legs syndrome: Secondary | ICD-10-CM | POA: Diagnosis not present

## 2021-11-13 DIAGNOSIS — M4854XD Collapsed vertebra, not elsewhere classified, thoracic region, subsequent encounter for fracture with routine healing: Secondary | ICD-10-CM | POA: Diagnosis not present

## 2021-11-13 DIAGNOSIS — I120 Hypertensive chronic kidney disease with stage 5 chronic kidney disease or end stage renal disease: Secondary | ICD-10-CM | POA: Diagnosis not present

## 2021-11-13 DIAGNOSIS — N186 End stage renal disease: Secondary | ICD-10-CM | POA: Diagnosis not present

## 2021-11-13 DIAGNOSIS — D631 Anemia in chronic kidney disease: Secondary | ICD-10-CM | POA: Diagnosis not present

## 2021-11-13 DIAGNOSIS — M1A9XX0 Chronic gout, unspecified, without tophus (tophi): Secondary | ICD-10-CM | POA: Diagnosis not present

## 2021-11-14 DIAGNOSIS — Z992 Dependence on renal dialysis: Secondary | ICD-10-CM | POA: Diagnosis not present

## 2021-11-14 DIAGNOSIS — D509 Iron deficiency anemia, unspecified: Secondary | ICD-10-CM | POA: Diagnosis not present

## 2021-11-14 DIAGNOSIS — D631 Anemia in chronic kidney disease: Secondary | ICD-10-CM | POA: Diagnosis not present

## 2021-11-14 DIAGNOSIS — N2581 Secondary hyperparathyroidism of renal origin: Secondary | ICD-10-CM | POA: Diagnosis not present

## 2021-11-14 DIAGNOSIS — N186 End stage renal disease: Secondary | ICD-10-CM | POA: Diagnosis not present

## 2021-11-14 DIAGNOSIS — N250 Renal osteodystrophy: Secondary | ICD-10-CM | POA: Diagnosis not present

## 2021-11-16 DIAGNOSIS — N2581 Secondary hyperparathyroidism of renal origin: Secondary | ICD-10-CM | POA: Diagnosis not present

## 2021-11-16 DIAGNOSIS — N250 Renal osteodystrophy: Secondary | ICD-10-CM | POA: Diagnosis not present

## 2021-11-16 DIAGNOSIS — D509 Iron deficiency anemia, unspecified: Secondary | ICD-10-CM | POA: Diagnosis not present

## 2021-11-16 DIAGNOSIS — Z992 Dependence on renal dialysis: Secondary | ICD-10-CM | POA: Diagnosis not present

## 2021-11-16 DIAGNOSIS — D631 Anemia in chronic kidney disease: Secondary | ICD-10-CM | POA: Diagnosis not present

## 2021-11-16 DIAGNOSIS — N186 End stage renal disease: Secondary | ICD-10-CM | POA: Diagnosis not present

## 2021-11-19 DIAGNOSIS — D509 Iron deficiency anemia, unspecified: Secondary | ICD-10-CM | POA: Diagnosis not present

## 2021-11-19 DIAGNOSIS — N2581 Secondary hyperparathyroidism of renal origin: Secondary | ICD-10-CM | POA: Diagnosis not present

## 2021-11-19 DIAGNOSIS — N250 Renal osteodystrophy: Secondary | ICD-10-CM | POA: Diagnosis not present

## 2021-11-19 DIAGNOSIS — Z992 Dependence on renal dialysis: Secondary | ICD-10-CM | POA: Diagnosis not present

## 2021-11-19 DIAGNOSIS — N186 End stage renal disease: Secondary | ICD-10-CM | POA: Diagnosis not present

## 2021-11-19 DIAGNOSIS — D631 Anemia in chronic kidney disease: Secondary | ICD-10-CM | POA: Diagnosis not present

## 2021-11-20 DIAGNOSIS — I120 Hypertensive chronic kidney disease with stage 5 chronic kidney disease or end stage renal disease: Secondary | ICD-10-CM | POA: Diagnosis not present

## 2021-11-20 DIAGNOSIS — D631 Anemia in chronic kidney disease: Secondary | ICD-10-CM | POA: Diagnosis not present

## 2021-11-20 DIAGNOSIS — M1A9XX0 Chronic gout, unspecified, without tophus (tophi): Secondary | ICD-10-CM | POA: Diagnosis not present

## 2021-11-20 DIAGNOSIS — G2581 Restless legs syndrome: Secondary | ICD-10-CM | POA: Diagnosis not present

## 2021-11-20 DIAGNOSIS — M4854XD Collapsed vertebra, not elsewhere classified, thoracic region, subsequent encounter for fracture with routine healing: Secondary | ICD-10-CM | POA: Diagnosis not present

## 2021-11-20 DIAGNOSIS — N186 End stage renal disease: Secondary | ICD-10-CM | POA: Diagnosis not present

## 2021-11-21 DIAGNOSIS — D631 Anemia in chronic kidney disease: Secondary | ICD-10-CM | POA: Diagnosis not present

## 2021-11-21 DIAGNOSIS — N2581 Secondary hyperparathyroidism of renal origin: Secondary | ICD-10-CM | POA: Diagnosis not present

## 2021-11-21 DIAGNOSIS — N250 Renal osteodystrophy: Secondary | ICD-10-CM | POA: Diagnosis not present

## 2021-11-21 DIAGNOSIS — D509 Iron deficiency anemia, unspecified: Secondary | ICD-10-CM | POA: Diagnosis not present

## 2021-11-21 DIAGNOSIS — Z992 Dependence on renal dialysis: Secondary | ICD-10-CM | POA: Diagnosis not present

## 2021-11-21 DIAGNOSIS — N186 End stage renal disease: Secondary | ICD-10-CM | POA: Diagnosis not present

## 2021-11-23 DIAGNOSIS — N250 Renal osteodystrophy: Secondary | ICD-10-CM | POA: Diagnosis not present

## 2021-11-23 DIAGNOSIS — D509 Iron deficiency anemia, unspecified: Secondary | ICD-10-CM | POA: Diagnosis not present

## 2021-11-23 DIAGNOSIS — N186 End stage renal disease: Secondary | ICD-10-CM | POA: Diagnosis not present

## 2021-11-23 DIAGNOSIS — N2581 Secondary hyperparathyroidism of renal origin: Secondary | ICD-10-CM | POA: Diagnosis not present

## 2021-11-23 DIAGNOSIS — D631 Anemia in chronic kidney disease: Secondary | ICD-10-CM | POA: Diagnosis not present

## 2021-11-23 DIAGNOSIS — Z992 Dependence on renal dialysis: Secondary | ICD-10-CM | POA: Diagnosis not present

## 2021-11-26 DIAGNOSIS — D631 Anemia in chronic kidney disease: Secondary | ICD-10-CM | POA: Diagnosis not present

## 2021-11-26 DIAGNOSIS — Z992 Dependence on renal dialysis: Secondary | ICD-10-CM | POA: Diagnosis not present

## 2021-11-26 DIAGNOSIS — N186 End stage renal disease: Secondary | ICD-10-CM | POA: Diagnosis not present

## 2021-11-26 DIAGNOSIS — N250 Renal osteodystrophy: Secondary | ICD-10-CM | POA: Diagnosis not present

## 2021-11-26 DIAGNOSIS — D509 Iron deficiency anemia, unspecified: Secondary | ICD-10-CM | POA: Diagnosis not present

## 2021-11-26 DIAGNOSIS — N2581 Secondary hyperparathyroidism of renal origin: Secondary | ICD-10-CM | POA: Diagnosis not present

## 2021-11-27 DIAGNOSIS — M4854XD Collapsed vertebra, not elsewhere classified, thoracic region, subsequent encounter for fracture with routine healing: Secondary | ICD-10-CM | POA: Diagnosis not present

## 2021-11-27 DIAGNOSIS — D631 Anemia in chronic kidney disease: Secondary | ICD-10-CM | POA: Diagnosis not present

## 2021-11-27 DIAGNOSIS — Z992 Dependence on renal dialysis: Secondary | ICD-10-CM | POA: Diagnosis not present

## 2021-11-27 DIAGNOSIS — M1A9XX0 Chronic gout, unspecified, without tophus (tophi): Secondary | ICD-10-CM | POA: Diagnosis not present

## 2021-11-27 DIAGNOSIS — G2581 Restless legs syndrome: Secondary | ICD-10-CM | POA: Diagnosis not present

## 2021-11-27 DIAGNOSIS — I120 Hypertensive chronic kidney disease with stage 5 chronic kidney disease or end stage renal disease: Secondary | ICD-10-CM | POA: Diagnosis not present

## 2021-11-27 DIAGNOSIS — N186 End stage renal disease: Secondary | ICD-10-CM | POA: Diagnosis not present

## 2021-11-28 DIAGNOSIS — N2581 Secondary hyperparathyroidism of renal origin: Secondary | ICD-10-CM | POA: Diagnosis not present

## 2021-11-28 DIAGNOSIS — Z992 Dependence on renal dialysis: Secondary | ICD-10-CM | POA: Diagnosis not present

## 2021-11-28 DIAGNOSIS — D509 Iron deficiency anemia, unspecified: Secondary | ICD-10-CM | POA: Diagnosis not present

## 2021-11-28 DIAGNOSIS — D631 Anemia in chronic kidney disease: Secondary | ICD-10-CM | POA: Diagnosis not present

## 2021-11-28 DIAGNOSIS — N186 End stage renal disease: Secondary | ICD-10-CM | POA: Diagnosis not present

## 2021-11-28 DIAGNOSIS — N250 Renal osteodystrophy: Secondary | ICD-10-CM | POA: Diagnosis not present

## 2021-11-29 DIAGNOSIS — N250 Renal osteodystrophy: Secondary | ICD-10-CM | POA: Diagnosis not present

## 2021-11-29 DIAGNOSIS — G2581 Restless legs syndrome: Secondary | ICD-10-CM | POA: Diagnosis not present

## 2021-11-29 DIAGNOSIS — I120 Hypertensive chronic kidney disease with stage 5 chronic kidney disease or end stage renal disease: Secondary | ICD-10-CM | POA: Diagnosis not present

## 2021-11-29 DIAGNOSIS — M1A9XX0 Chronic gout, unspecified, without tophus (tophi): Secondary | ICD-10-CM | POA: Diagnosis not present

## 2021-11-29 DIAGNOSIS — M4854XA Collapsed vertebra, not elsewhere classified, thoracic region, initial encounter for fracture: Secondary | ICD-10-CM | POA: Diagnosis not present

## 2021-11-29 DIAGNOSIS — M1712 Unilateral primary osteoarthritis, left knee: Secondary | ICD-10-CM | POA: Diagnosis not present

## 2021-11-29 DIAGNOSIS — Z992 Dependence on renal dialysis: Secondary | ICD-10-CM | POA: Diagnosis not present

## 2021-11-29 DIAGNOSIS — M47816 Spondylosis without myelopathy or radiculopathy, lumbar region: Secondary | ICD-10-CM | POA: Diagnosis not present

## 2021-11-29 DIAGNOSIS — D631 Anemia in chronic kidney disease: Secondary | ICD-10-CM | POA: Diagnosis not present

## 2021-11-29 DIAGNOSIS — Z86711 Personal history of pulmonary embolism: Secondary | ICD-10-CM | POA: Diagnosis not present

## 2021-11-29 DIAGNOSIS — S22070D Wedge compression fracture of T9-T10 vertebra, subsequent encounter for fracture with routine healing: Secondary | ICD-10-CM | POA: Diagnosis not present

## 2021-11-29 DIAGNOSIS — M4854XD Collapsed vertebra, not elsewhere classified, thoracic region, subsequent encounter for fracture with routine healing: Secondary | ICD-10-CM | POA: Diagnosis not present

## 2021-11-29 DIAGNOSIS — M4184 Other forms of scoliosis, thoracic region: Secondary | ICD-10-CM | POA: Diagnosis not present

## 2021-11-29 DIAGNOSIS — Z79899 Other long term (current) drug therapy: Secondary | ICD-10-CM | POA: Diagnosis not present

## 2021-11-29 DIAGNOSIS — N186 End stage renal disease: Secondary | ICD-10-CM | POA: Diagnosis not present

## 2021-11-29 DIAGNOSIS — K219 Gastro-esophageal reflux disease without esophagitis: Secondary | ICD-10-CM | POA: Diagnosis not present

## 2021-11-30 DIAGNOSIS — N2581 Secondary hyperparathyroidism of renal origin: Secondary | ICD-10-CM | POA: Diagnosis not present

## 2021-11-30 DIAGNOSIS — D509 Iron deficiency anemia, unspecified: Secondary | ICD-10-CM | POA: Diagnosis not present

## 2021-11-30 DIAGNOSIS — N186 End stage renal disease: Secondary | ICD-10-CM | POA: Diagnosis not present

## 2021-11-30 DIAGNOSIS — D631 Anemia in chronic kidney disease: Secondary | ICD-10-CM | POA: Diagnosis not present

## 2021-11-30 DIAGNOSIS — N250 Renal osteodystrophy: Secondary | ICD-10-CM | POA: Diagnosis not present

## 2021-11-30 DIAGNOSIS — Z992 Dependence on renal dialysis: Secondary | ICD-10-CM | POA: Diagnosis not present

## 2021-12-03 DIAGNOSIS — M4854XA Collapsed vertebra, not elsewhere classified, thoracic region, initial encounter for fracture: Secondary | ICD-10-CM | POA: Diagnosis present

## 2021-12-03 DIAGNOSIS — E875 Hyperkalemia: Secondary | ICD-10-CM | POA: Diagnosis present

## 2021-12-03 DIAGNOSIS — M1A9XX0 Chronic gout, unspecified, without tophus (tophi): Secondary | ICD-10-CM | POA: Diagnosis present

## 2021-12-03 DIAGNOSIS — J9621 Acute and chronic respiratory failure with hypoxia: Secondary | ICD-10-CM | POA: Diagnosis present

## 2021-12-03 DIAGNOSIS — Z9981 Dependence on supplemental oxygen: Secondary | ICD-10-CM | POA: Diagnosis not present

## 2021-12-03 DIAGNOSIS — R519 Headache, unspecified: Secondary | ICD-10-CM | POA: Diagnosis not present

## 2021-12-03 DIAGNOSIS — R0989 Other specified symptoms and signs involving the circulatory and respiratory systems: Secondary | ICD-10-CM | POA: Diagnosis not present

## 2021-12-03 DIAGNOSIS — R42 Dizziness and giddiness: Secondary | ICD-10-CM | POA: Diagnosis not present

## 2021-12-03 DIAGNOSIS — D509 Iron deficiency anemia, unspecified: Secondary | ICD-10-CM | POA: Diagnosis not present

## 2021-12-03 DIAGNOSIS — I517 Cardiomegaly: Secondary | ICD-10-CM | POA: Diagnosis not present

## 2021-12-03 DIAGNOSIS — Z992 Dependence on renal dialysis: Secondary | ICD-10-CM | POA: Diagnosis not present

## 2021-12-03 DIAGNOSIS — J9811 Atelectasis: Secondary | ICD-10-CM | POA: Diagnosis not present

## 2021-12-03 DIAGNOSIS — I10 Essential (primary) hypertension: Secondary | ICD-10-CM | POA: Diagnosis not present

## 2021-12-03 DIAGNOSIS — K219 Gastro-esophageal reflux disease without esophagitis: Secondary | ICD-10-CM | POA: Diagnosis present

## 2021-12-03 DIAGNOSIS — R600 Localized edema: Secondary | ICD-10-CM | POA: Diagnosis not present

## 2021-12-03 DIAGNOSIS — Z79899 Other long term (current) drug therapy: Secondary | ICD-10-CM | POA: Diagnosis not present

## 2021-12-03 DIAGNOSIS — N186 End stage renal disease: Secondary | ICD-10-CM | POA: Diagnosis present

## 2021-12-03 DIAGNOSIS — D631 Anemia in chronic kidney disease: Secondary | ICD-10-CM | POA: Diagnosis not present

## 2021-12-03 DIAGNOSIS — I160 Hypertensive urgency: Secondary | ICD-10-CM | POA: Diagnosis present

## 2021-12-03 DIAGNOSIS — R410 Disorientation, unspecified: Secondary | ICD-10-CM | POA: Diagnosis not present

## 2021-12-03 DIAGNOSIS — Z6841 Body Mass Index (BMI) 40.0 and over, adult: Secondary | ICD-10-CM | POA: Diagnosis not present

## 2021-12-03 DIAGNOSIS — E8770 Fluid overload, unspecified: Secondary | ICD-10-CM | POA: Diagnosis present

## 2021-12-03 DIAGNOSIS — N2581 Secondary hyperparathyroidism of renal origin: Secondary | ICD-10-CM | POA: Diagnosis not present

## 2021-12-03 DIAGNOSIS — R0902 Hypoxemia: Secondary | ICD-10-CM | POA: Diagnosis not present

## 2021-12-03 DIAGNOSIS — S22000A Wedge compression fracture of unspecified thoracic vertebra, initial encounter for closed fracture: Secondary | ICD-10-CM | POA: Diagnosis not present

## 2021-12-03 DIAGNOSIS — N250 Renal osteodystrophy: Secondary | ICD-10-CM | POA: Diagnosis not present

## 2021-12-03 DIAGNOSIS — H538 Other visual disturbances: Secondary | ICD-10-CM | POA: Diagnosis not present

## 2021-12-03 DIAGNOSIS — I672 Cerebral atherosclerosis: Secondary | ICD-10-CM | POA: Diagnosis not present

## 2021-12-03 DIAGNOSIS — I161 Hypertensive emergency: Secondary | ICD-10-CM | POA: Diagnosis not present

## 2021-12-03 DIAGNOSIS — I120 Hypertensive chronic kidney disease with stage 5 chronic kidney disease or end stage renal disease: Secondary | ICD-10-CM | POA: Diagnosis present

## 2021-12-03 DIAGNOSIS — I890 Lymphedema, not elsewhere classified: Secondary | ICD-10-CM | POA: Diagnosis present

## 2021-12-04 DIAGNOSIS — E875 Hyperkalemia: Secondary | ICD-10-CM | POA: Diagnosis present

## 2021-12-04 DIAGNOSIS — J9621 Acute and chronic respiratory failure with hypoxia: Secondary | ICD-10-CM | POA: Diagnosis present

## 2021-12-04 DIAGNOSIS — M4854XA Collapsed vertebra, not elsewhere classified, thoracic region, initial encounter for fracture: Secondary | ICD-10-CM | POA: Diagnosis present

## 2021-12-04 DIAGNOSIS — N186 End stage renal disease: Secondary | ICD-10-CM | POA: Diagnosis present

## 2021-12-04 DIAGNOSIS — Z6841 Body Mass Index (BMI) 40.0 and over, adult: Secondary | ICD-10-CM | POA: Diagnosis not present

## 2021-12-04 DIAGNOSIS — I161 Hypertensive emergency: Secondary | ICD-10-CM | POA: Diagnosis not present

## 2021-12-04 DIAGNOSIS — M1A9XX0 Chronic gout, unspecified, without tophus (tophi): Secondary | ICD-10-CM | POA: Diagnosis present

## 2021-12-04 DIAGNOSIS — Z79899 Other long term (current) drug therapy: Secondary | ICD-10-CM | POA: Diagnosis not present

## 2021-12-04 DIAGNOSIS — Z9981 Dependence on supplemental oxygen: Secondary | ICD-10-CM | POA: Diagnosis not present

## 2021-12-04 DIAGNOSIS — I160 Hypertensive urgency: Secondary | ICD-10-CM | POA: Diagnosis present

## 2021-12-04 DIAGNOSIS — R600 Localized edema: Secondary | ICD-10-CM | POA: Diagnosis not present

## 2021-12-04 DIAGNOSIS — E8770 Fluid overload, unspecified: Secondary | ICD-10-CM | POA: Diagnosis present

## 2021-12-04 DIAGNOSIS — S22000A Wedge compression fracture of unspecified thoracic vertebra, initial encounter for closed fracture: Secondary | ICD-10-CM | POA: Diagnosis not present

## 2021-12-04 DIAGNOSIS — Z992 Dependence on renal dialysis: Secondary | ICD-10-CM | POA: Diagnosis not present

## 2021-12-04 DIAGNOSIS — I120 Hypertensive chronic kidney disease with stage 5 chronic kidney disease or end stage renal disease: Secondary | ICD-10-CM | POA: Diagnosis present

## 2021-12-04 DIAGNOSIS — I890 Lymphedema, not elsewhere classified: Secondary | ICD-10-CM | POA: Diagnosis present

## 2021-12-04 DIAGNOSIS — K219 Gastro-esophageal reflux disease without esophagitis: Secondary | ICD-10-CM | POA: Diagnosis present

## 2021-12-07 DIAGNOSIS — D631 Anemia in chronic kidney disease: Secondary | ICD-10-CM | POA: Diagnosis not present

## 2021-12-07 DIAGNOSIS — N2581 Secondary hyperparathyroidism of renal origin: Secondary | ICD-10-CM | POA: Diagnosis not present

## 2021-12-07 DIAGNOSIS — N250 Renal osteodystrophy: Secondary | ICD-10-CM | POA: Diagnosis not present

## 2021-12-07 DIAGNOSIS — Z992 Dependence on renal dialysis: Secondary | ICD-10-CM | POA: Diagnosis not present

## 2021-12-07 DIAGNOSIS — D509 Iron deficiency anemia, unspecified: Secondary | ICD-10-CM | POA: Diagnosis not present

## 2021-12-07 DIAGNOSIS — N186 End stage renal disease: Secondary | ICD-10-CM | POA: Diagnosis not present

## 2021-12-10 DIAGNOSIS — N250 Renal osteodystrophy: Secondary | ICD-10-CM | POA: Diagnosis not present

## 2021-12-10 DIAGNOSIS — N2581 Secondary hyperparathyroidism of renal origin: Secondary | ICD-10-CM | POA: Diagnosis not present

## 2021-12-10 DIAGNOSIS — N186 End stage renal disease: Secondary | ICD-10-CM | POA: Diagnosis not present

## 2021-12-10 DIAGNOSIS — Z992 Dependence on renal dialysis: Secondary | ICD-10-CM | POA: Diagnosis not present

## 2021-12-10 DIAGNOSIS — D509 Iron deficiency anemia, unspecified: Secondary | ICD-10-CM | POA: Diagnosis not present

## 2021-12-10 DIAGNOSIS — D631 Anemia in chronic kidney disease: Secondary | ICD-10-CM | POA: Diagnosis not present

## 2021-12-11 ENCOUNTER — Encounter: Payer: Self-pay | Admitting: *Deleted

## 2021-12-11 ENCOUNTER — Other Ambulatory Visit: Payer: Self-pay | Admitting: *Deleted

## 2021-12-11 DIAGNOSIS — D631 Anemia in chronic kidney disease: Secondary | ICD-10-CM | POA: Diagnosis not present

## 2021-12-11 DIAGNOSIS — N186 End stage renal disease: Secondary | ICD-10-CM | POA: Diagnosis not present

## 2021-12-11 DIAGNOSIS — I120 Hypertensive chronic kidney disease with stage 5 chronic kidney disease or end stage renal disease: Secondary | ICD-10-CM | POA: Diagnosis not present

## 2021-12-11 DIAGNOSIS — G2581 Restless legs syndrome: Secondary | ICD-10-CM | POA: Diagnosis not present

## 2021-12-11 DIAGNOSIS — M1A9XX0 Chronic gout, unspecified, without tophus (tophi): Secondary | ICD-10-CM | POA: Diagnosis not present

## 2021-12-11 DIAGNOSIS — M4854XD Collapsed vertebra, not elsewhere classified, thoracic region, subsequent encounter for fracture with routine healing: Secondary | ICD-10-CM | POA: Diagnosis not present

## 2021-12-11 NOTE — Patient Outreach (Signed)
[facility]) Care Management  12/11/2021  Christine Horn 30-May-1966 [id number]  Telephone outreach for [place] Management services. Talked with [name] and provided her information about our services. She voices that she is not sure if she would really be in need.  She reports she recently had a vertebral fx related to ESRD. She is in an uncomfortable brace and will be getting an adjustment in the brace next week. She goes to dialysis on Tues/Thurs/Sat. She is married and her husband is a good support to her.  She confirms that Dr. Grant Fontana at the [facility] is her MD since Dr. Marco Collie no longer is in the office on Friday's which is pt's best day to be seen in the office.  We agreed that NP would send our information and she can let me know if she would like to participate. She was appreciative of the call.  Eulah Pont. Myrtie Neither, MSN, [facility] Gerontological Nurse Practitioner [facility] Care Management [phone]

## 2021-12-12 DIAGNOSIS — N186 End stage renal disease: Secondary | ICD-10-CM | POA: Diagnosis not present

## 2021-12-12 DIAGNOSIS — N2581 Secondary hyperparathyroidism of renal origin: Secondary | ICD-10-CM | POA: Diagnosis not present

## 2021-12-12 DIAGNOSIS — D631 Anemia in chronic kidney disease: Secondary | ICD-10-CM | POA: Diagnosis not present

## 2021-12-12 DIAGNOSIS — Z992 Dependence on renal dialysis: Secondary | ICD-10-CM | POA: Diagnosis not present

## 2021-12-12 DIAGNOSIS — D509 Iron deficiency anemia, unspecified: Secondary | ICD-10-CM | POA: Diagnosis not present

## 2021-12-12 DIAGNOSIS — N250 Renal osteodystrophy: Secondary | ICD-10-CM | POA: Diagnosis not present

## 2021-12-14 DIAGNOSIS — D631 Anemia in chronic kidney disease: Secondary | ICD-10-CM | POA: Diagnosis not present

## 2021-12-14 DIAGNOSIS — D509 Iron deficiency anemia, unspecified: Secondary | ICD-10-CM | POA: Diagnosis not present

## 2021-12-14 DIAGNOSIS — Z992 Dependence on renal dialysis: Secondary | ICD-10-CM | POA: Diagnosis not present

## 2021-12-14 DIAGNOSIS — N2581 Secondary hyperparathyroidism of renal origin: Secondary | ICD-10-CM | POA: Diagnosis not present

## 2021-12-14 DIAGNOSIS — N186 End stage renal disease: Secondary | ICD-10-CM | POA: Diagnosis not present

## 2021-12-14 DIAGNOSIS — N250 Renal osteodystrophy: Secondary | ICD-10-CM | POA: Diagnosis not present

## 2021-12-16 DIAGNOSIS — D631 Anemia in chronic kidney disease: Secondary | ICD-10-CM | POA: Diagnosis not present

## 2021-12-16 DIAGNOSIS — M1A9XX0 Chronic gout, unspecified, without tophus (tophi): Secondary | ICD-10-CM | POA: Diagnosis not present

## 2021-12-16 DIAGNOSIS — N186 End stage renal disease: Secondary | ICD-10-CM | POA: Diagnosis not present

## 2021-12-16 DIAGNOSIS — M4854XD Collapsed vertebra, not elsewhere classified, thoracic region, subsequent encounter for fracture with routine healing: Secondary | ICD-10-CM | POA: Diagnosis not present

## 2021-12-16 DIAGNOSIS — I120 Hypertensive chronic kidney disease with stage 5 chronic kidney disease or end stage renal disease: Secondary | ICD-10-CM | POA: Diagnosis not present

## 2021-12-16 DIAGNOSIS — Z992 Dependence on renal dialysis: Secondary | ICD-10-CM | POA: Diagnosis not present

## 2021-12-16 DIAGNOSIS — G2581 Restless legs syndrome: Secondary | ICD-10-CM | POA: Diagnosis not present

## 2021-12-17 DIAGNOSIS — R778 Other specified abnormalities of plasma proteins: Secondary | ICD-10-CM | POA: Diagnosis not present

## 2021-12-17 DIAGNOSIS — Z0389 Encounter for observation for other suspected diseases and conditions ruled out: Secondary | ICD-10-CM | POA: Diagnosis not present

## 2021-12-17 DIAGNOSIS — I517 Cardiomegaly: Secondary | ICD-10-CM | POA: Diagnosis not present

## 2021-12-17 DIAGNOSIS — S22070G Wedge compression fracture of T9-T10 vertebra, subsequent encounter for fracture with delayed healing: Secondary | ICD-10-CM | POA: Diagnosis not present

## 2021-12-17 DIAGNOSIS — E875 Hyperkalemia: Secondary | ICD-10-CM | POA: Diagnosis not present

## 2021-12-17 DIAGNOSIS — K219 Gastro-esophageal reflux disease without esophagitis: Secondary | ICD-10-CM | POA: Diagnosis not present

## 2021-12-17 DIAGNOSIS — I4581 Long QT syndrome: Secondary | ICD-10-CM | POA: Diagnosis not present

## 2021-12-17 DIAGNOSIS — R160 Hepatomegaly, not elsewhere classified: Secondary | ICD-10-CM | POA: Diagnosis not present

## 2021-12-17 DIAGNOSIS — I469 Cardiac arrest, cause unspecified: Secondary | ICD-10-CM | POA: Diagnosis not present

## 2021-12-17 DIAGNOSIS — T40605A Adverse effect of unspecified narcotics, initial encounter: Secondary | ICD-10-CM | POA: Diagnosis not present

## 2021-12-17 DIAGNOSIS — S22000A Wedge compression fracture of unspecified thoracic vertebra, initial encounter for closed fracture: Secondary | ICD-10-CM | POA: Diagnosis not present

## 2021-12-17 DIAGNOSIS — R609 Edema, unspecified: Secondary | ICD-10-CM | POA: Diagnosis not present

## 2021-12-17 DIAGNOSIS — R9431 Abnormal electrocardiogram [ECG] [EKG]: Secondary | ICD-10-CM | POA: Diagnosis not present

## 2021-12-17 DIAGNOSIS — M47814 Spondylosis without myelopathy or radiculopathy, thoracic region: Secondary | ICD-10-CM | POA: Diagnosis not present

## 2021-12-17 DIAGNOSIS — R008 Other abnormalities of heart beat: Secondary | ICD-10-CM | POA: Diagnosis not present

## 2021-12-17 DIAGNOSIS — G2581 Restless legs syndrome: Secondary | ICD-10-CM | POA: Diagnosis not present

## 2021-12-17 DIAGNOSIS — R531 Weakness: Secondary | ICD-10-CM | POA: Diagnosis not present

## 2021-12-17 DIAGNOSIS — I21A1 Myocardial infarction type 2: Secondary | ICD-10-CM | POA: Diagnosis not present

## 2021-12-17 DIAGNOSIS — I160 Hypertensive urgency: Secondary | ICD-10-CM | POA: Diagnosis not present

## 2021-12-17 DIAGNOSIS — M898X8 Other specified disorders of bone, other site: Secondary | ICD-10-CM | POA: Diagnosis not present

## 2021-12-17 DIAGNOSIS — S22070A Wedge compression fracture of T9-T10 vertebra, initial encounter for closed fracture: Secondary | ICD-10-CM | POA: Diagnosis not present

## 2021-12-17 DIAGNOSIS — Z79899 Other long term (current) drug therapy: Secondary | ICD-10-CM | POA: Diagnosis not present

## 2021-12-17 DIAGNOSIS — Z01810 Encounter for preprocedural cardiovascular examination: Secondary | ICD-10-CM | POA: Diagnosis not present

## 2021-12-17 DIAGNOSIS — R9389 Abnormal findings on diagnostic imaging of other specified body structures: Secondary | ICD-10-CM | POA: Diagnosis not present

## 2021-12-17 DIAGNOSIS — I10 Essential (primary) hypertension: Secondary | ICD-10-CM | POA: Diagnosis not present

## 2021-12-17 DIAGNOSIS — I120 Hypertensive chronic kidney disease with stage 5 chronic kidney disease or end stage renal disease: Secondary | ICD-10-CM | POA: Diagnosis not present

## 2021-12-17 DIAGNOSIS — N186 End stage renal disease: Secondary | ICD-10-CM | POA: Diagnosis not present

## 2021-12-17 DIAGNOSIS — E861 Hypovolemia: Secondary | ICD-10-CM | POA: Diagnosis not present

## 2021-12-17 DIAGNOSIS — J9611 Chronic respiratory failure with hypoxia: Secondary | ICD-10-CM | POA: Diagnosis not present

## 2021-12-17 DIAGNOSIS — D638 Anemia in other chronic diseases classified elsewhere: Secondary | ICD-10-CM | POA: Diagnosis not present

## 2021-12-17 DIAGNOSIS — Z8673 Personal history of transient ischemic attack (TIA), and cerebral infarction without residual deficits: Secondary | ICD-10-CM | POA: Diagnosis not present

## 2021-12-17 DIAGNOSIS — J9610 Chronic respiratory failure, unspecified whether with hypoxia or hypercapnia: Secondary | ICD-10-CM | POA: Diagnosis not present

## 2021-12-17 DIAGNOSIS — K5903 Drug induced constipation: Secondary | ICD-10-CM | POA: Diagnosis not present

## 2021-12-17 DIAGNOSIS — Z981 Arthrodesis status: Secondary | ICD-10-CM | POA: Diagnosis not present

## 2021-12-17 DIAGNOSIS — D649 Anemia, unspecified: Secondary | ICD-10-CM | POA: Diagnosis not present

## 2021-12-17 DIAGNOSIS — M1A9XX0 Chronic gout, unspecified, without tophus (tophi): Secondary | ICD-10-CM | POA: Diagnosis not present

## 2021-12-17 DIAGNOSIS — J811 Chronic pulmonary edema: Secondary | ICD-10-CM | POA: Diagnosis not present

## 2021-12-17 DIAGNOSIS — S23153A Dislocation of T9/T10 thoracic vertebra, initial encounter: Secondary | ICD-10-CM | POA: Diagnosis not present

## 2021-12-17 DIAGNOSIS — Z452 Encounter for adjustment and management of vascular access device: Secondary | ICD-10-CM | POA: Diagnosis not present

## 2021-12-17 DIAGNOSIS — Z86711 Personal history of pulmonary embolism: Secondary | ICD-10-CM | POA: Diagnosis not present

## 2021-12-17 DIAGNOSIS — M5134 Other intervertebral disc degeneration, thoracic region: Secondary | ICD-10-CM | POA: Diagnosis not present

## 2021-12-17 DIAGNOSIS — I9589 Other hypotension: Secondary | ICD-10-CM | POA: Diagnosis not present

## 2021-12-17 DIAGNOSIS — I440 Atrioventricular block, first degree: Secondary | ICD-10-CM | POA: Diagnosis not present

## 2021-12-17 DIAGNOSIS — I214 Non-ST elevation (NSTEMI) myocardial infarction: Secondary | ICD-10-CM | POA: Diagnosis not present

## 2021-12-17 DIAGNOSIS — M549 Dorsalgia, unspecified: Secondary | ICD-10-CM | POA: Diagnosis not present

## 2021-12-17 DIAGNOSIS — D631 Anemia in chronic kidney disease: Secondary | ICD-10-CM | POA: Diagnosis not present

## 2021-12-17 DIAGNOSIS — Z6841 Body Mass Index (BMI) 40.0 and over, adult: Secondary | ICD-10-CM | POA: Diagnosis not present

## 2021-12-17 DIAGNOSIS — R0789 Other chest pain: Secondary | ICD-10-CM | POA: Diagnosis not present

## 2021-12-17 DIAGNOSIS — M109 Gout, unspecified: Secondary | ICD-10-CM | POA: Diagnosis not present

## 2021-12-17 DIAGNOSIS — I748 Embolism and thrombosis of other arteries: Secondary | ICD-10-CM | POA: Diagnosis not present

## 2021-12-17 DIAGNOSIS — K5730 Diverticulosis of large intestine without perforation or abscess without bleeding: Secondary | ICD-10-CM | POA: Diagnosis not present

## 2021-12-17 DIAGNOSIS — N261 Atrophy of kidney (terminal): Secondary | ICD-10-CM | POA: Diagnosis not present

## 2021-12-17 DIAGNOSIS — M4854XA Collapsed vertebra, not elsewhere classified, thoracic region, initial encounter for fracture: Secondary | ICD-10-CM | POA: Diagnosis not present

## 2021-12-17 DIAGNOSIS — M4644 Discitis, unspecified, thoracic region: Secondary | ICD-10-CM | POA: Diagnosis not present

## 2021-12-17 DIAGNOSIS — M4804 Spinal stenosis, thoracic region: Secondary | ICD-10-CM | POA: Diagnosis not present

## 2021-12-17 DIAGNOSIS — M4624 Osteomyelitis of vertebra, thoracic region: Secondary | ICD-10-CM | POA: Diagnosis not present

## 2021-12-17 DIAGNOSIS — Z538 Procedure and treatment not carried out for other reasons: Secondary | ICD-10-CM | POA: Diagnosis not present

## 2021-12-17 DIAGNOSIS — R918 Other nonspecific abnormal finding of lung field: Secondary | ICD-10-CM | POA: Diagnosis not present

## 2021-12-17 DIAGNOSIS — I468 Cardiac arrest due to other underlying condition: Secondary | ICD-10-CM | POA: Diagnosis not present

## 2021-12-17 DIAGNOSIS — Z992 Dependence on renal dialysis: Secondary | ICD-10-CM | POA: Diagnosis not present

## 2021-12-17 DIAGNOSIS — Z4789 Encounter for other orthopedic aftercare: Secondary | ICD-10-CM | POA: Diagnosis not present

## 2021-12-18 DIAGNOSIS — I10 Essential (primary) hypertension: Secondary | ICD-10-CM | POA: Diagnosis not present

## 2021-12-18 DIAGNOSIS — E875 Hyperkalemia: Secondary | ICD-10-CM | POA: Diagnosis not present

## 2021-12-18 DIAGNOSIS — D631 Anemia in chronic kidney disease: Secondary | ICD-10-CM | POA: Diagnosis not present

## 2021-12-18 DIAGNOSIS — Z992 Dependence on renal dialysis: Secondary | ICD-10-CM | POA: Diagnosis not present

## 2021-12-18 DIAGNOSIS — M4644 Discitis, unspecified, thoracic region: Secondary | ICD-10-CM | POA: Diagnosis not present

## 2021-12-18 DIAGNOSIS — Z6841 Body Mass Index (BMI) 40.0 and over, adult: Secondary | ICD-10-CM | POA: Diagnosis not present

## 2021-12-18 DIAGNOSIS — N186 End stage renal disease: Secondary | ICD-10-CM | POA: Diagnosis not present

## 2021-12-19 DIAGNOSIS — Z992 Dependence on renal dialysis: Secondary | ICD-10-CM | POA: Diagnosis not present

## 2021-12-19 DIAGNOSIS — I10 Essential (primary) hypertension: Secondary | ICD-10-CM | POA: Diagnosis not present

## 2021-12-19 DIAGNOSIS — N186 End stage renal disease: Secondary | ICD-10-CM | POA: Diagnosis not present

## 2021-12-19 DIAGNOSIS — E875 Hyperkalemia: Secondary | ICD-10-CM | POA: Diagnosis not present

## 2021-12-19 DIAGNOSIS — Z6841 Body Mass Index (BMI) 40.0 and over, adult: Secondary | ICD-10-CM | POA: Diagnosis not present

## 2021-12-19 DIAGNOSIS — M4644 Discitis, unspecified, thoracic region: Secondary | ICD-10-CM | POA: Diagnosis not present

## 2021-12-19 DIAGNOSIS — D631 Anemia in chronic kidney disease: Secondary | ICD-10-CM | POA: Diagnosis not present

## 2021-12-20 DIAGNOSIS — M5134 Other intervertebral disc degeneration, thoracic region: Secondary | ICD-10-CM | POA: Diagnosis not present

## 2021-12-20 DIAGNOSIS — I10 Essential (primary) hypertension: Secondary | ICD-10-CM | POA: Diagnosis not present

## 2021-12-20 DIAGNOSIS — D631 Anemia in chronic kidney disease: Secondary | ICD-10-CM | POA: Diagnosis not present

## 2021-12-20 DIAGNOSIS — N186 End stage renal disease: Secondary | ICD-10-CM | POA: Diagnosis not present

## 2021-12-20 DIAGNOSIS — Z6841 Body Mass Index (BMI) 40.0 and over, adult: Secondary | ICD-10-CM | POA: Diagnosis not present

## 2021-12-20 DIAGNOSIS — M549 Dorsalgia, unspecified: Secondary | ICD-10-CM | POA: Diagnosis not present

## 2021-12-20 DIAGNOSIS — M47814 Spondylosis without myelopathy or radiculopathy, thoracic region: Secondary | ICD-10-CM | POA: Diagnosis not present

## 2021-12-20 DIAGNOSIS — M4804 Spinal stenosis, thoracic region: Secondary | ICD-10-CM | POA: Diagnosis not present

## 2021-12-20 DIAGNOSIS — Z992 Dependence on renal dialysis: Secondary | ICD-10-CM | POA: Diagnosis not present

## 2021-12-20 DIAGNOSIS — E875 Hyperkalemia: Secondary | ICD-10-CM | POA: Diagnosis not present

## 2021-12-20 DIAGNOSIS — M4644 Discitis, unspecified, thoracic region: Secondary | ICD-10-CM | POA: Diagnosis not present

## 2021-12-21 DIAGNOSIS — N186 End stage renal disease: Secondary | ICD-10-CM | POA: Diagnosis not present

## 2021-12-21 DIAGNOSIS — Z992 Dependence on renal dialysis: Secondary | ICD-10-CM | POA: Diagnosis not present

## 2021-12-21 DIAGNOSIS — K5903 Drug induced constipation: Secondary | ICD-10-CM | POA: Diagnosis not present

## 2021-12-21 DIAGNOSIS — M4644 Discitis, unspecified, thoracic region: Secondary | ICD-10-CM | POA: Diagnosis not present

## 2021-12-22 DIAGNOSIS — Z86711 Personal history of pulmonary embolism: Secondary | ICD-10-CM | POA: Diagnosis not present

## 2021-12-22 DIAGNOSIS — K5903 Drug induced constipation: Secondary | ICD-10-CM | POA: Diagnosis not present

## 2021-12-22 DIAGNOSIS — D638 Anemia in other chronic diseases classified elsewhere: Secondary | ICD-10-CM | POA: Diagnosis not present

## 2021-12-22 DIAGNOSIS — I10 Essential (primary) hypertension: Secondary | ICD-10-CM | POA: Diagnosis not present

## 2021-12-22 DIAGNOSIS — N186 End stage renal disease: Secondary | ICD-10-CM | POA: Diagnosis not present

## 2021-12-22 DIAGNOSIS — M4644 Discitis, unspecified, thoracic region: Secondary | ICD-10-CM | POA: Diagnosis not present

## 2021-12-22 DIAGNOSIS — Z992 Dependence on renal dialysis: Secondary | ICD-10-CM | POA: Diagnosis not present

## 2021-12-22 DIAGNOSIS — E875 Hyperkalemia: Secondary | ICD-10-CM | POA: Diagnosis not present

## 2021-12-23 DIAGNOSIS — E875 Hyperkalemia: Secondary | ICD-10-CM | POA: Diagnosis not present

## 2021-12-23 DIAGNOSIS — Z86711 Personal history of pulmonary embolism: Secondary | ICD-10-CM | POA: Diagnosis not present

## 2021-12-23 DIAGNOSIS — Z01810 Encounter for preprocedural cardiovascular examination: Secondary | ICD-10-CM | POA: Diagnosis not present

## 2021-12-23 DIAGNOSIS — D638 Anemia in other chronic diseases classified elsewhere: Secondary | ICD-10-CM | POA: Diagnosis not present

## 2021-12-23 DIAGNOSIS — K5903 Drug induced constipation: Secondary | ICD-10-CM | POA: Diagnosis not present

## 2021-12-23 DIAGNOSIS — N186 End stage renal disease: Secondary | ICD-10-CM | POA: Diagnosis not present

## 2021-12-23 DIAGNOSIS — Z992 Dependence on renal dialysis: Secondary | ICD-10-CM | POA: Diagnosis not present

## 2021-12-23 DIAGNOSIS — M4644 Discitis, unspecified, thoracic region: Secondary | ICD-10-CM | POA: Diagnosis not present

## 2021-12-23 DIAGNOSIS — I10 Essential (primary) hypertension: Secondary | ICD-10-CM | POA: Diagnosis not present

## 2021-12-24 DIAGNOSIS — Z992 Dependence on renal dialysis: Secondary | ICD-10-CM | POA: Diagnosis not present

## 2021-12-24 DIAGNOSIS — K5903 Drug induced constipation: Secondary | ICD-10-CM | POA: Diagnosis not present

## 2021-12-24 DIAGNOSIS — N186 End stage renal disease: Secondary | ICD-10-CM | POA: Diagnosis not present

## 2021-12-24 DIAGNOSIS — E875 Hyperkalemia: Secondary | ICD-10-CM | POA: Diagnosis not present

## 2021-12-24 DIAGNOSIS — D638 Anemia in other chronic diseases classified elsewhere: Secondary | ICD-10-CM | POA: Diagnosis not present

## 2021-12-24 DIAGNOSIS — M4644 Discitis, unspecified, thoracic region: Secondary | ICD-10-CM | POA: Diagnosis not present

## 2021-12-25 DIAGNOSIS — J9610 Chronic respiratory failure, unspecified whether with hypoxia or hypercapnia: Secondary | ICD-10-CM | POA: Diagnosis not present

## 2021-12-25 DIAGNOSIS — I10 Essential (primary) hypertension: Secondary | ICD-10-CM | POA: Diagnosis not present

## 2021-12-25 DIAGNOSIS — I9589 Other hypotension: Secondary | ICD-10-CM | POA: Diagnosis not present

## 2021-12-25 DIAGNOSIS — Z6841 Body Mass Index (BMI) 40.0 and over, adult: Secondary | ICD-10-CM | POA: Diagnosis not present

## 2021-12-25 DIAGNOSIS — Z538 Procedure and treatment not carried out for other reasons: Secondary | ICD-10-CM | POA: Diagnosis not present

## 2021-12-25 DIAGNOSIS — G2581 Restless legs syndrome: Secondary | ICD-10-CM | POA: Diagnosis not present

## 2021-12-25 DIAGNOSIS — J811 Chronic pulmonary edema: Secondary | ICD-10-CM | POA: Diagnosis not present

## 2021-12-25 DIAGNOSIS — D638 Anemia in other chronic diseases classified elsewhere: Secondary | ICD-10-CM | POA: Diagnosis not present

## 2021-12-25 DIAGNOSIS — S22070G Wedge compression fracture of T9-T10 vertebra, subsequent encounter for fracture with delayed healing: Secondary | ICD-10-CM | POA: Diagnosis not present

## 2021-12-25 DIAGNOSIS — I517 Cardiomegaly: Secondary | ICD-10-CM | POA: Diagnosis not present

## 2021-12-25 DIAGNOSIS — Z992 Dependence on renal dialysis: Secondary | ICD-10-CM | POA: Diagnosis not present

## 2021-12-25 DIAGNOSIS — N186 End stage renal disease: Secondary | ICD-10-CM | POA: Diagnosis not present

## 2021-12-25 DIAGNOSIS — M4644 Discitis, unspecified, thoracic region: Secondary | ICD-10-CM | POA: Diagnosis not present

## 2021-12-25 DIAGNOSIS — E875 Hyperkalemia: Secondary | ICD-10-CM | POA: Diagnosis not present

## 2021-12-25 DIAGNOSIS — I469 Cardiac arrest, cause unspecified: Secondary | ICD-10-CM | POA: Diagnosis not present

## 2021-12-25 DIAGNOSIS — Z981 Arthrodesis status: Secondary | ICD-10-CM | POA: Diagnosis not present

## 2021-12-26 DIAGNOSIS — E875 Hyperkalemia: Secondary | ICD-10-CM | POA: Diagnosis not present

## 2021-12-26 DIAGNOSIS — I10 Essential (primary) hypertension: Secondary | ICD-10-CM | POA: Diagnosis not present

## 2021-12-26 DIAGNOSIS — M4644 Discitis, unspecified, thoracic region: Secondary | ICD-10-CM | POA: Diagnosis not present

## 2021-12-26 DIAGNOSIS — I469 Cardiac arrest, cause unspecified: Secondary | ICD-10-CM | POA: Diagnosis not present

## 2021-12-26 DIAGNOSIS — I214 Non-ST elevation (NSTEMI) myocardial infarction: Secondary | ICD-10-CM | POA: Diagnosis not present

## 2021-12-26 DIAGNOSIS — Z992 Dependence on renal dialysis: Secondary | ICD-10-CM | POA: Diagnosis not present

## 2021-12-26 DIAGNOSIS — N186 End stage renal disease: Secondary | ICD-10-CM | POA: Diagnosis not present

## 2021-12-27 DIAGNOSIS — N186 End stage renal disease: Secondary | ICD-10-CM | POA: Diagnosis not present

## 2021-12-27 DIAGNOSIS — I10 Essential (primary) hypertension: Secondary | ICD-10-CM | POA: Diagnosis not present

## 2021-12-27 DIAGNOSIS — R008 Other abnormalities of heart beat: Secondary | ICD-10-CM | POA: Diagnosis not present

## 2021-12-27 DIAGNOSIS — D638 Anemia in other chronic diseases classified elsewhere: Secondary | ICD-10-CM | POA: Diagnosis not present

## 2021-12-27 DIAGNOSIS — M4644 Discitis, unspecified, thoracic region: Secondary | ICD-10-CM | POA: Diagnosis not present

## 2021-12-27 DIAGNOSIS — Z6841 Body Mass Index (BMI) 40.0 and over, adult: Secondary | ICD-10-CM | POA: Diagnosis not present

## 2021-12-27 DIAGNOSIS — I469 Cardiac arrest, cause unspecified: Secondary | ICD-10-CM | POA: Diagnosis not present

## 2021-12-27 DIAGNOSIS — I517 Cardiomegaly: Secondary | ICD-10-CM | POA: Diagnosis not present

## 2021-12-27 DIAGNOSIS — I468 Cardiac arrest due to other underlying condition: Secondary | ICD-10-CM | POA: Diagnosis not present

## 2021-12-27 DIAGNOSIS — E875 Hyperkalemia: Secondary | ICD-10-CM | POA: Diagnosis not present

## 2021-12-27 DIAGNOSIS — Z992 Dependence on renal dialysis: Secondary | ICD-10-CM | POA: Diagnosis not present

## 2021-12-27 DIAGNOSIS — J9610 Chronic respiratory failure, unspecified whether with hypoxia or hypercapnia: Secondary | ICD-10-CM | POA: Diagnosis not present

## 2021-12-28 DIAGNOSIS — J9611 Chronic respiratory failure with hypoxia: Secondary | ICD-10-CM | POA: Diagnosis not present

## 2021-12-28 DIAGNOSIS — I469 Cardiac arrest, cause unspecified: Secondary | ICD-10-CM | POA: Diagnosis not present

## 2021-12-28 DIAGNOSIS — D631 Anemia in chronic kidney disease: Secondary | ICD-10-CM | POA: Diagnosis not present

## 2021-12-28 DIAGNOSIS — Z992 Dependence on renal dialysis: Secondary | ICD-10-CM | POA: Diagnosis not present

## 2021-12-28 DIAGNOSIS — I10 Essential (primary) hypertension: Secondary | ICD-10-CM | POA: Diagnosis not present

## 2021-12-28 DIAGNOSIS — E875 Hyperkalemia: Secondary | ICD-10-CM | POA: Diagnosis not present

## 2021-12-28 DIAGNOSIS — M4644 Discitis, unspecified, thoracic region: Secondary | ICD-10-CM | POA: Diagnosis not present

## 2021-12-28 DIAGNOSIS — M1A9XX0 Chronic gout, unspecified, without tophus (tophi): Secondary | ICD-10-CM | POA: Diagnosis not present

## 2021-12-28 DIAGNOSIS — K5903 Drug induced constipation: Secondary | ICD-10-CM | POA: Diagnosis not present

## 2021-12-28 DIAGNOSIS — N186 End stage renal disease: Secondary | ICD-10-CM | POA: Diagnosis not present

## 2021-12-29 DIAGNOSIS — M1A9XX0 Chronic gout, unspecified, without tophus (tophi): Secondary | ICD-10-CM | POA: Diagnosis not present

## 2021-12-29 DIAGNOSIS — J9610 Chronic respiratory failure, unspecified whether with hypoxia or hypercapnia: Secondary | ICD-10-CM | POA: Diagnosis not present

## 2021-12-29 DIAGNOSIS — D649 Anemia, unspecified: Secondary | ICD-10-CM | POA: Diagnosis not present

## 2021-12-29 DIAGNOSIS — M4644 Discitis, unspecified, thoracic region: Secondary | ICD-10-CM | POA: Diagnosis not present

## 2021-12-29 DIAGNOSIS — N186 End stage renal disease: Secondary | ICD-10-CM | POA: Diagnosis not present

## 2021-12-29 DIAGNOSIS — G2581 Restless legs syndrome: Secondary | ICD-10-CM | POA: Diagnosis not present

## 2021-12-29 DIAGNOSIS — Z6841 Body Mass Index (BMI) 40.0 and over, adult: Secondary | ICD-10-CM | POA: Diagnosis not present

## 2021-12-29 DIAGNOSIS — K5903 Drug induced constipation: Secondary | ICD-10-CM | POA: Diagnosis not present

## 2021-12-29 DIAGNOSIS — Z86711 Personal history of pulmonary embolism: Secondary | ICD-10-CM | POA: Diagnosis not present

## 2021-12-29 DIAGNOSIS — E875 Hyperkalemia: Secondary | ICD-10-CM | POA: Diagnosis not present

## 2021-12-29 DIAGNOSIS — I120 Hypertensive chronic kidney disease with stage 5 chronic kidney disease or end stage renal disease: Secondary | ICD-10-CM | POA: Diagnosis not present

## 2021-12-29 DIAGNOSIS — Z992 Dependence on renal dialysis: Secondary | ICD-10-CM | POA: Diagnosis not present

## 2021-12-30 DIAGNOSIS — Z981 Arthrodesis status: Secondary | ICD-10-CM | POA: Diagnosis not present

## 2021-12-30 DIAGNOSIS — S23153A Dislocation of T9/T10 thoracic vertebra, initial encounter: Secondary | ICD-10-CM | POA: Diagnosis not present

## 2021-12-30 DIAGNOSIS — I120 Hypertensive chronic kidney disease with stage 5 chronic kidney disease or end stage renal disease: Secondary | ICD-10-CM | POA: Diagnosis not present

## 2021-12-30 DIAGNOSIS — Z6841 Body Mass Index (BMI) 40.0 and over, adult: Secondary | ICD-10-CM | POA: Diagnosis not present

## 2021-12-30 DIAGNOSIS — J9610 Chronic respiratory failure, unspecified whether with hypoxia or hypercapnia: Secondary | ICD-10-CM | POA: Diagnosis not present

## 2021-12-30 DIAGNOSIS — K5903 Drug induced constipation: Secondary | ICD-10-CM | POA: Diagnosis not present

## 2021-12-30 DIAGNOSIS — M898X8 Other specified disorders of bone, other site: Secondary | ICD-10-CM | POA: Diagnosis not present

## 2021-12-30 DIAGNOSIS — N186 End stage renal disease: Secondary | ICD-10-CM | POA: Diagnosis not present

## 2021-12-30 DIAGNOSIS — G2581 Restless legs syndrome: Secondary | ICD-10-CM | POA: Diagnosis not present

## 2021-12-30 DIAGNOSIS — D649 Anemia, unspecified: Secondary | ICD-10-CM | POA: Diagnosis not present

## 2021-12-30 DIAGNOSIS — Z992 Dependence on renal dialysis: Secondary | ICD-10-CM | POA: Diagnosis not present

## 2021-12-30 DIAGNOSIS — R918 Other nonspecific abnormal finding of lung field: Secondary | ICD-10-CM | POA: Diagnosis not present

## 2021-12-30 DIAGNOSIS — M4644 Discitis, unspecified, thoracic region: Secondary | ICD-10-CM | POA: Diagnosis not present

## 2021-12-30 DIAGNOSIS — I517 Cardiomegaly: Secondary | ICD-10-CM | POA: Diagnosis not present

## 2021-12-30 DIAGNOSIS — E875 Hyperkalemia: Secondary | ICD-10-CM | POA: Diagnosis not present

## 2021-12-30 DIAGNOSIS — Z452 Encounter for adjustment and management of vascular access device: Secondary | ICD-10-CM | POA: Diagnosis not present

## 2021-12-30 DIAGNOSIS — Z86711 Personal history of pulmonary embolism: Secondary | ICD-10-CM | POA: Diagnosis not present

## 2021-12-30 DIAGNOSIS — I748 Embolism and thrombosis of other arteries: Secondary | ICD-10-CM | POA: Diagnosis not present

## 2021-12-30 DIAGNOSIS — S22070G Wedge compression fracture of T9-T10 vertebra, subsequent encounter for fracture with delayed healing: Secondary | ICD-10-CM | POA: Diagnosis not present

## 2021-12-30 DIAGNOSIS — M1A9XX0 Chronic gout, unspecified, without tophus (tophi): Secondary | ICD-10-CM | POA: Diagnosis not present

## 2021-12-31 DIAGNOSIS — M4644 Discitis, unspecified, thoracic region: Secondary | ICD-10-CM | POA: Diagnosis not present

## 2021-12-31 DIAGNOSIS — G2581 Restless legs syndrome: Secondary | ICD-10-CM | POA: Diagnosis not present

## 2021-12-31 DIAGNOSIS — I120 Hypertensive chronic kidney disease with stage 5 chronic kidney disease or end stage renal disease: Secondary | ICD-10-CM | POA: Diagnosis not present

## 2021-12-31 DIAGNOSIS — S22070A Wedge compression fracture of T9-T10 vertebra, initial encounter for closed fracture: Secondary | ICD-10-CM | POA: Diagnosis not present

## 2021-12-31 DIAGNOSIS — Z992 Dependence on renal dialysis: Secondary | ICD-10-CM | POA: Diagnosis not present

## 2021-12-31 DIAGNOSIS — Z86711 Personal history of pulmonary embolism: Secondary | ICD-10-CM | POA: Diagnosis not present

## 2021-12-31 DIAGNOSIS — K5903 Drug induced constipation: Secondary | ICD-10-CM | POA: Diagnosis not present

## 2021-12-31 DIAGNOSIS — J9610 Chronic respiratory failure, unspecified whether with hypoxia or hypercapnia: Secondary | ICD-10-CM | POA: Diagnosis not present

## 2021-12-31 DIAGNOSIS — E875 Hyperkalemia: Secondary | ICD-10-CM | POA: Diagnosis not present

## 2021-12-31 DIAGNOSIS — Z6841 Body Mass Index (BMI) 40.0 and over, adult: Secondary | ICD-10-CM | POA: Diagnosis not present

## 2021-12-31 DIAGNOSIS — D649 Anemia, unspecified: Secondary | ICD-10-CM | POA: Diagnosis not present

## 2021-12-31 DIAGNOSIS — N186 End stage renal disease: Secondary | ICD-10-CM | POA: Diagnosis not present

## 2021-12-31 DIAGNOSIS — M1A9XX0 Chronic gout, unspecified, without tophus (tophi): Secondary | ICD-10-CM | POA: Diagnosis not present

## 2022-01-01 DIAGNOSIS — R531 Weakness: Secondary | ICD-10-CM | POA: Diagnosis not present

## 2022-01-01 DIAGNOSIS — I469 Cardiac arrest, cause unspecified: Secondary | ICD-10-CM | POA: Diagnosis not present

## 2022-01-01 DIAGNOSIS — E875 Hyperkalemia: Secondary | ICD-10-CM | POA: Diagnosis not present

## 2022-01-01 DIAGNOSIS — M4644 Discitis, unspecified, thoracic region: Secondary | ICD-10-CM | POA: Diagnosis not present

## 2022-01-01 DIAGNOSIS — Z4789 Encounter for other orthopedic aftercare: Secondary | ICD-10-CM | POA: Diagnosis not present

## 2022-01-01 DIAGNOSIS — R778 Other specified abnormalities of plasma proteins: Secondary | ICD-10-CM | POA: Diagnosis not present

## 2022-01-01 DIAGNOSIS — Z86711 Personal history of pulmonary embolism: Secondary | ICD-10-CM | POA: Diagnosis not present

## 2022-01-01 DIAGNOSIS — S22000A Wedge compression fracture of unspecified thoracic vertebra, initial encounter for closed fracture: Secondary | ICD-10-CM | POA: Diagnosis not present

## 2022-01-01 DIAGNOSIS — J9610 Chronic respiratory failure, unspecified whether with hypoxia or hypercapnia: Secondary | ICD-10-CM | POA: Diagnosis not present

## 2022-01-01 DIAGNOSIS — N186 End stage renal disease: Secondary | ICD-10-CM | POA: Diagnosis not present

## 2022-01-01 DIAGNOSIS — K5903 Drug induced constipation: Secondary | ICD-10-CM | POA: Diagnosis not present

## 2022-01-02 DIAGNOSIS — S22000A Wedge compression fracture of unspecified thoracic vertebra, initial encounter for closed fracture: Secondary | ICD-10-CM | POA: Diagnosis not present

## 2022-01-02 DIAGNOSIS — Z86711 Personal history of pulmonary embolism: Secondary | ICD-10-CM | POA: Diagnosis not present

## 2022-01-02 DIAGNOSIS — E875 Hyperkalemia: Secondary | ICD-10-CM | POA: Diagnosis not present

## 2022-01-02 DIAGNOSIS — R778 Other specified abnormalities of plasma proteins: Secondary | ICD-10-CM | POA: Diagnosis not present

## 2022-01-02 DIAGNOSIS — J9610 Chronic respiratory failure, unspecified whether with hypoxia or hypercapnia: Secondary | ICD-10-CM | POA: Diagnosis not present

## 2022-01-02 DIAGNOSIS — I469 Cardiac arrest, cause unspecified: Secondary | ICD-10-CM | POA: Diagnosis not present

## 2022-01-02 DIAGNOSIS — M4644 Discitis, unspecified, thoracic region: Secondary | ICD-10-CM | POA: Diagnosis not present

## 2022-01-02 DIAGNOSIS — S22070A Wedge compression fracture of T9-T10 vertebra, initial encounter for closed fracture: Secondary | ICD-10-CM | POA: Diagnosis not present

## 2022-01-02 DIAGNOSIS — Z992 Dependence on renal dialysis: Secondary | ICD-10-CM | POA: Diagnosis not present

## 2022-01-02 DIAGNOSIS — N186 End stage renal disease: Secondary | ICD-10-CM | POA: Diagnosis not present

## 2022-01-02 DIAGNOSIS — R531 Weakness: Secondary | ICD-10-CM | POA: Diagnosis not present

## 2022-01-02 DIAGNOSIS — K5903 Drug induced constipation: Secondary | ICD-10-CM | POA: Diagnosis not present

## 2022-01-03 DIAGNOSIS — K5903 Drug induced constipation: Secondary | ICD-10-CM | POA: Diagnosis not present

## 2022-01-03 DIAGNOSIS — R531 Weakness: Secondary | ICD-10-CM | POA: Diagnosis not present

## 2022-01-03 DIAGNOSIS — S22070A Wedge compression fracture of T9-T10 vertebra, initial encounter for closed fracture: Secondary | ICD-10-CM | POA: Diagnosis not present

## 2022-01-03 DIAGNOSIS — M4644 Discitis, unspecified, thoracic region: Secondary | ICD-10-CM | POA: Diagnosis not present

## 2022-01-03 DIAGNOSIS — R778 Other specified abnormalities of plasma proteins: Secondary | ICD-10-CM | POA: Diagnosis not present

## 2022-01-03 DIAGNOSIS — E875 Hyperkalemia: Secondary | ICD-10-CM | POA: Diagnosis not present

## 2022-01-03 DIAGNOSIS — N186 End stage renal disease: Secondary | ICD-10-CM | POA: Diagnosis not present

## 2022-01-03 DIAGNOSIS — S22000A Wedge compression fracture of unspecified thoracic vertebra, initial encounter for closed fracture: Secondary | ICD-10-CM | POA: Diagnosis not present

## 2022-01-03 DIAGNOSIS — Z86711 Personal history of pulmonary embolism: Secondary | ICD-10-CM | POA: Diagnosis not present

## 2022-01-03 DIAGNOSIS — J9610 Chronic respiratory failure, unspecified whether with hypoxia or hypercapnia: Secondary | ICD-10-CM | POA: Diagnosis not present

## 2022-01-03 DIAGNOSIS — I469 Cardiac arrest, cause unspecified: Secondary | ICD-10-CM | POA: Diagnosis not present

## 2022-01-04 DIAGNOSIS — R778 Other specified abnormalities of plasma proteins: Secondary | ICD-10-CM | POA: Diagnosis not present

## 2022-01-04 DIAGNOSIS — Z86711 Personal history of pulmonary embolism: Secondary | ICD-10-CM | POA: Diagnosis not present

## 2022-01-04 DIAGNOSIS — R531 Weakness: Secondary | ICD-10-CM | POA: Diagnosis not present

## 2022-01-04 DIAGNOSIS — E875 Hyperkalemia: Secondary | ICD-10-CM | POA: Diagnosis not present

## 2022-01-04 DIAGNOSIS — N186 End stage renal disease: Secondary | ICD-10-CM | POA: Diagnosis not present

## 2022-01-04 DIAGNOSIS — S22000A Wedge compression fracture of unspecified thoracic vertebra, initial encounter for closed fracture: Secondary | ICD-10-CM | POA: Diagnosis not present

## 2022-01-04 DIAGNOSIS — I469 Cardiac arrest, cause unspecified: Secondary | ICD-10-CM | POA: Diagnosis not present

## 2022-01-04 DIAGNOSIS — Z992 Dependence on renal dialysis: Secondary | ICD-10-CM | POA: Diagnosis not present

## 2022-01-04 DIAGNOSIS — K5903 Drug induced constipation: Secondary | ICD-10-CM | POA: Diagnosis not present

## 2022-01-04 DIAGNOSIS — M4644 Discitis, unspecified, thoracic region: Secondary | ICD-10-CM | POA: Diagnosis not present

## 2022-01-04 DIAGNOSIS — J9610 Chronic respiratory failure, unspecified whether with hypoxia or hypercapnia: Secondary | ICD-10-CM | POA: Diagnosis not present

## 2022-01-05 DIAGNOSIS — N186 End stage renal disease: Secondary | ICD-10-CM | POA: Diagnosis not present

## 2022-01-05 DIAGNOSIS — Z86711 Personal history of pulmonary embolism: Secondary | ICD-10-CM | POA: Diagnosis not present

## 2022-01-05 DIAGNOSIS — M4644 Discitis, unspecified, thoracic region: Secondary | ICD-10-CM | POA: Diagnosis not present

## 2022-01-05 DIAGNOSIS — R531 Weakness: Secondary | ICD-10-CM | POA: Diagnosis not present

## 2022-01-05 DIAGNOSIS — E875 Hyperkalemia: Secondary | ICD-10-CM | POA: Diagnosis not present

## 2022-01-05 DIAGNOSIS — K5903 Drug induced constipation: Secondary | ICD-10-CM | POA: Diagnosis not present

## 2022-01-05 DIAGNOSIS — R778 Other specified abnormalities of plasma proteins: Secondary | ICD-10-CM | POA: Diagnosis not present

## 2022-01-05 DIAGNOSIS — Z992 Dependence on renal dialysis: Secondary | ICD-10-CM | POA: Diagnosis not present

## 2022-01-05 DIAGNOSIS — S22000A Wedge compression fracture of unspecified thoracic vertebra, initial encounter for closed fracture: Secondary | ICD-10-CM | POA: Diagnosis not present

## 2022-01-05 DIAGNOSIS — I469 Cardiac arrest, cause unspecified: Secondary | ICD-10-CM | POA: Diagnosis not present

## 2022-01-05 DIAGNOSIS — J9610 Chronic respiratory failure, unspecified whether with hypoxia or hypercapnia: Secondary | ICD-10-CM | POA: Diagnosis not present

## 2022-01-06 DIAGNOSIS — Z86711 Personal history of pulmonary embolism: Secondary | ICD-10-CM | POA: Diagnosis not present

## 2022-01-06 DIAGNOSIS — R778 Other specified abnormalities of plasma proteins: Secondary | ICD-10-CM | POA: Diagnosis not present

## 2022-01-06 DIAGNOSIS — I469 Cardiac arrest, cause unspecified: Secondary | ICD-10-CM | POA: Diagnosis not present

## 2022-01-06 DIAGNOSIS — N186 End stage renal disease: Secondary | ICD-10-CM | POA: Diagnosis not present

## 2022-01-06 DIAGNOSIS — J9610 Chronic respiratory failure, unspecified whether with hypoxia or hypercapnia: Secondary | ICD-10-CM | POA: Diagnosis not present

## 2022-01-06 DIAGNOSIS — S22070A Wedge compression fracture of T9-T10 vertebra, initial encounter for closed fracture: Secondary | ICD-10-CM | POA: Diagnosis not present

## 2022-01-06 DIAGNOSIS — K5903 Drug induced constipation: Secondary | ICD-10-CM | POA: Diagnosis not present

## 2022-01-06 DIAGNOSIS — E875 Hyperkalemia: Secondary | ICD-10-CM | POA: Diagnosis not present

## 2022-01-06 DIAGNOSIS — S22000A Wedge compression fracture of unspecified thoracic vertebra, initial encounter for closed fracture: Secondary | ICD-10-CM | POA: Diagnosis not present

## 2022-01-06 DIAGNOSIS — R531 Weakness: Secondary | ICD-10-CM | POA: Diagnosis not present

## 2022-01-06 DIAGNOSIS — M4644 Discitis, unspecified, thoracic region: Secondary | ICD-10-CM | POA: Diagnosis not present

## 2022-01-07 DIAGNOSIS — R778 Other specified abnormalities of plasma proteins: Secondary | ICD-10-CM | POA: Diagnosis not present

## 2022-01-07 DIAGNOSIS — N186 End stage renal disease: Secondary | ICD-10-CM | POA: Diagnosis not present

## 2022-01-07 DIAGNOSIS — Z992 Dependence on renal dialysis: Secondary | ICD-10-CM | POA: Diagnosis not present

## 2022-01-07 DIAGNOSIS — K5903 Drug induced constipation: Secondary | ICD-10-CM | POA: Diagnosis not present

## 2022-01-07 DIAGNOSIS — E875 Hyperkalemia: Secondary | ICD-10-CM | POA: Diagnosis not present

## 2022-01-07 DIAGNOSIS — R531 Weakness: Secondary | ICD-10-CM | POA: Diagnosis not present

## 2022-01-07 DIAGNOSIS — J9610 Chronic respiratory failure, unspecified whether with hypoxia or hypercapnia: Secondary | ICD-10-CM | POA: Diagnosis not present

## 2022-01-07 DIAGNOSIS — M4644 Discitis, unspecified, thoracic region: Secondary | ICD-10-CM | POA: Diagnosis not present

## 2022-01-07 DIAGNOSIS — Z86711 Personal history of pulmonary embolism: Secondary | ICD-10-CM | POA: Diagnosis not present

## 2022-01-07 DIAGNOSIS — S22000A Wedge compression fracture of unspecified thoracic vertebra, initial encounter for closed fracture: Secondary | ICD-10-CM | POA: Diagnosis not present

## 2022-01-07 DIAGNOSIS — I469 Cardiac arrest, cause unspecified: Secondary | ICD-10-CM | POA: Diagnosis not present

## 2022-01-08 DIAGNOSIS — M4644 Discitis, unspecified, thoracic region: Secondary | ICD-10-CM | POA: Diagnosis not present

## 2022-01-08 DIAGNOSIS — Z6841 Body Mass Index (BMI) 40.0 and over, adult: Secondary | ICD-10-CM | POA: Diagnosis not present

## 2022-01-08 DIAGNOSIS — S22000A Wedge compression fracture of unspecified thoracic vertebra, initial encounter for closed fracture: Secondary | ICD-10-CM | POA: Diagnosis not present

## 2022-01-08 DIAGNOSIS — I469 Cardiac arrest, cause unspecified: Secondary | ICD-10-CM | POA: Diagnosis not present

## 2022-01-08 DIAGNOSIS — N186 End stage renal disease: Secondary | ICD-10-CM | POA: Diagnosis not present

## 2022-01-09 DIAGNOSIS — S22000A Wedge compression fracture of unspecified thoracic vertebra, initial encounter for closed fracture: Secondary | ICD-10-CM | POA: Diagnosis not present

## 2022-01-09 DIAGNOSIS — Z992 Dependence on renal dialysis: Secondary | ICD-10-CM | POA: Diagnosis not present

## 2022-01-09 DIAGNOSIS — I469 Cardiac arrest, cause unspecified: Secondary | ICD-10-CM | POA: Diagnosis not present

## 2022-01-09 DIAGNOSIS — Z6841 Body Mass Index (BMI) 40.0 and over, adult: Secondary | ICD-10-CM | POA: Diagnosis not present

## 2022-01-09 DIAGNOSIS — M4644 Discitis, unspecified, thoracic region: Secondary | ICD-10-CM | POA: Diagnosis not present

## 2022-01-09 DIAGNOSIS — N186 End stage renal disease: Secondary | ICD-10-CM | POA: Diagnosis not present

## 2022-01-10 DIAGNOSIS — M1A9XX0 Chronic gout, unspecified, without tophus (tophi): Secondary | ICD-10-CM | POA: Diagnosis not present

## 2022-01-10 DIAGNOSIS — Z9981 Dependence on supplemental oxygen: Secondary | ICD-10-CM | POA: Diagnosis not present

## 2022-01-10 DIAGNOSIS — Z992 Dependence on renal dialysis: Secondary | ICD-10-CM | POA: Diagnosis not present

## 2022-01-10 DIAGNOSIS — M5414 Radiculopathy, thoracic region: Secondary | ICD-10-CM | POA: Diagnosis not present

## 2022-01-10 DIAGNOSIS — I120 Hypertensive chronic kidney disease with stage 5 chronic kidney disease or end stage renal disease: Secondary | ICD-10-CM | POA: Diagnosis not present

## 2022-01-10 DIAGNOSIS — N186 End stage renal disease: Secondary | ICD-10-CM | POA: Diagnosis not present

## 2022-01-10 DIAGNOSIS — Z79899 Other long term (current) drug therapy: Secondary | ICD-10-CM | POA: Diagnosis not present

## 2022-01-10 DIAGNOSIS — Z86711 Personal history of pulmonary embolism: Secondary | ICD-10-CM | POA: Diagnosis not present

## 2022-01-10 DIAGNOSIS — D631 Anemia in chronic kidney disease: Secondary | ICD-10-CM | POA: Diagnosis not present

## 2022-01-10 DIAGNOSIS — K219 Gastro-esophageal reflux disease without esophagitis: Secondary | ICD-10-CM | POA: Diagnosis not present

## 2022-01-10 DIAGNOSIS — M1712 Unilateral primary osteoarthritis, left knee: Secondary | ICD-10-CM | POA: Diagnosis not present

## 2022-01-10 DIAGNOSIS — N250 Renal osteodystrophy: Secondary | ICD-10-CM | POA: Diagnosis not present

## 2022-01-10 DIAGNOSIS — Z6841 Body Mass Index (BMI) 40.0 and over, adult: Secondary | ICD-10-CM | POA: Diagnosis not present

## 2022-01-10 DIAGNOSIS — M4854XD Collapsed vertebra, not elsewhere classified, thoracic region, subsequent encounter for fracture with routine healing: Secondary | ICD-10-CM | POA: Diagnosis not present

## 2022-01-10 DIAGNOSIS — J9620 Acute and chronic respiratory failure, unspecified whether with hypoxia or hypercapnia: Secondary | ICD-10-CM | POA: Diagnosis not present

## 2022-01-10 DIAGNOSIS — Z452 Encounter for adjustment and management of vascular access device: Secondary | ICD-10-CM | POA: Diagnosis not present

## 2022-01-10 DIAGNOSIS — G2581 Restless legs syndrome: Secondary | ICD-10-CM | POA: Diagnosis not present

## 2022-01-10 DIAGNOSIS — M4644 Discitis, unspecified, thoracic region: Secondary | ICD-10-CM | POA: Diagnosis not present

## 2022-01-11 DIAGNOSIS — D631 Anemia in chronic kidney disease: Secondary | ICD-10-CM | POA: Diagnosis not present

## 2022-01-11 DIAGNOSIS — N250 Renal osteodystrophy: Secondary | ICD-10-CM | POA: Diagnosis not present

## 2022-01-11 DIAGNOSIS — Z992 Dependence on renal dialysis: Secondary | ICD-10-CM | POA: Diagnosis not present

## 2022-01-11 DIAGNOSIS — D509 Iron deficiency anemia, unspecified: Secondary | ICD-10-CM | POA: Diagnosis not present

## 2022-01-11 DIAGNOSIS — N2581 Secondary hyperparathyroidism of renal origin: Secondary | ICD-10-CM | POA: Diagnosis not present

## 2022-01-11 DIAGNOSIS — N186 End stage renal disease: Secondary | ICD-10-CM | POA: Diagnosis not present

## 2022-01-14 DIAGNOSIS — N186 End stage renal disease: Secondary | ICD-10-CM | POA: Diagnosis not present

## 2022-01-14 DIAGNOSIS — D509 Iron deficiency anemia, unspecified: Secondary | ICD-10-CM | POA: Diagnosis not present

## 2022-01-14 DIAGNOSIS — Z79899 Other long term (current) drug therapy: Secondary | ICD-10-CM | POA: Diagnosis not present

## 2022-01-14 DIAGNOSIS — D631 Anemia in chronic kidney disease: Secondary | ICD-10-CM | POA: Diagnosis not present

## 2022-01-14 DIAGNOSIS — N2581 Secondary hyperparathyroidism of renal origin: Secondary | ICD-10-CM | POA: Diagnosis not present

## 2022-01-14 DIAGNOSIS — Z4901 Encounter for fitting and adjustment of extracorporeal dialysis catheter: Secondary | ICD-10-CM | POA: Diagnosis not present

## 2022-01-14 DIAGNOSIS — Z452 Encounter for adjustment and management of vascular access device: Secondary | ICD-10-CM | POA: Diagnosis not present

## 2022-01-14 DIAGNOSIS — R197 Diarrhea, unspecified: Secondary | ICD-10-CM | POA: Diagnosis not present

## 2022-01-14 DIAGNOSIS — M4644 Discitis, unspecified, thoracic region: Secondary | ICD-10-CM | POA: Diagnosis not present

## 2022-01-14 DIAGNOSIS — N250 Renal osteodystrophy: Secondary | ICD-10-CM | POA: Diagnosis not present

## 2022-01-14 DIAGNOSIS — Z992 Dependence on renal dialysis: Secondary | ICD-10-CM | POA: Diagnosis not present

## 2022-01-16 DIAGNOSIS — Z992 Dependence on renal dialysis: Secondary | ICD-10-CM | POA: Diagnosis not present

## 2022-01-16 DIAGNOSIS — N250 Renal osteodystrophy: Secondary | ICD-10-CM | POA: Diagnosis not present

## 2022-01-16 DIAGNOSIS — N2581 Secondary hyperparathyroidism of renal origin: Secondary | ICD-10-CM | POA: Diagnosis not present

## 2022-01-16 DIAGNOSIS — D631 Anemia in chronic kidney disease: Secondary | ICD-10-CM | POA: Diagnosis not present

## 2022-01-16 DIAGNOSIS — N186 End stage renal disease: Secondary | ICD-10-CM | POA: Diagnosis not present

## 2022-01-16 DIAGNOSIS — D509 Iron deficiency anemia, unspecified: Secondary | ICD-10-CM | POA: Diagnosis not present

## 2022-01-17 DIAGNOSIS — I120 Hypertensive chronic kidney disease with stage 5 chronic kidney disease or end stage renal disease: Secondary | ICD-10-CM | POA: Diagnosis not present

## 2022-01-17 DIAGNOSIS — N250 Renal osteodystrophy: Secondary | ICD-10-CM | POA: Diagnosis not present

## 2022-01-17 DIAGNOSIS — M5414 Radiculopathy, thoracic region: Secondary | ICD-10-CM | POA: Diagnosis not present

## 2022-01-17 DIAGNOSIS — Z452 Encounter for adjustment and management of vascular access device: Secondary | ICD-10-CM | POA: Diagnosis not present

## 2022-01-17 DIAGNOSIS — M4644 Discitis, unspecified, thoracic region: Secondary | ICD-10-CM | POA: Diagnosis not present

## 2022-01-17 DIAGNOSIS — M4854XD Collapsed vertebra, not elsewhere classified, thoracic region, subsequent encounter for fracture with routine healing: Secondary | ICD-10-CM | POA: Diagnosis not present

## 2022-01-18 DIAGNOSIS — Z992 Dependence on renal dialysis: Secondary | ICD-10-CM | POA: Diagnosis not present

## 2022-01-18 DIAGNOSIS — N2581 Secondary hyperparathyroidism of renal origin: Secondary | ICD-10-CM | POA: Diagnosis not present

## 2022-01-18 DIAGNOSIS — D631 Anemia in chronic kidney disease: Secondary | ICD-10-CM | POA: Diagnosis not present

## 2022-01-18 DIAGNOSIS — N186 End stage renal disease: Secondary | ICD-10-CM | POA: Diagnosis not present

## 2022-01-18 DIAGNOSIS — N250 Renal osteodystrophy: Secondary | ICD-10-CM | POA: Diagnosis not present

## 2022-01-18 DIAGNOSIS — D509 Iron deficiency anemia, unspecified: Secondary | ICD-10-CM | POA: Diagnosis not present

## 2022-01-20 DIAGNOSIS — J9620 Acute and chronic respiratory failure, unspecified whether with hypoxia or hypercapnia: Secondary | ICD-10-CM | POA: Diagnosis not present

## 2022-01-20 DIAGNOSIS — Z79899 Other long term (current) drug therapy: Secondary | ICD-10-CM | POA: Diagnosis not present

## 2022-01-20 DIAGNOSIS — Z452 Encounter for adjustment and management of vascular access device: Secondary | ICD-10-CM | POA: Diagnosis not present

## 2022-01-20 DIAGNOSIS — I120 Hypertensive chronic kidney disease with stage 5 chronic kidney disease or end stage renal disease: Secondary | ICD-10-CM | POA: Diagnosis not present

## 2022-01-20 DIAGNOSIS — M5414 Radiculopathy, thoracic region: Secondary | ICD-10-CM | POA: Diagnosis not present

## 2022-01-20 DIAGNOSIS — D631 Anemia in chronic kidney disease: Secondary | ICD-10-CM | POA: Diagnosis not present

## 2022-01-20 DIAGNOSIS — M4854XD Collapsed vertebra, not elsewhere classified, thoracic region, subsequent encounter for fracture with routine healing: Secondary | ICD-10-CM | POA: Diagnosis not present

## 2022-01-20 DIAGNOSIS — M4644 Discitis, unspecified, thoracic region: Secondary | ICD-10-CM | POA: Diagnosis not present

## 2022-01-20 DIAGNOSIS — I509 Heart failure, unspecified: Secondary | ICD-10-CM | POA: Diagnosis not present

## 2022-01-20 DIAGNOSIS — N250 Renal osteodystrophy: Secondary | ICD-10-CM | POA: Diagnosis not present

## 2022-01-20 DIAGNOSIS — I110 Hypertensive heart disease with heart failure: Secondary | ICD-10-CM | POA: Diagnosis not present

## 2022-01-21 DIAGNOSIS — J811 Chronic pulmonary edema: Secondary | ICD-10-CM | POA: Diagnosis not present

## 2022-01-21 DIAGNOSIS — Z20822 Contact with and (suspected) exposure to covid-19: Secondary | ICD-10-CM | POA: Diagnosis not present

## 2022-01-21 DIAGNOSIS — Z96652 Presence of left artificial knee joint: Secondary | ICD-10-CM | POA: Diagnosis not present

## 2022-01-21 DIAGNOSIS — Z5321 Procedure and treatment not carried out due to patient leaving prior to being seen by health care provider: Secondary | ICD-10-CM | POA: Diagnosis not present

## 2022-01-21 DIAGNOSIS — R0602 Shortness of breath: Secondary | ICD-10-CM | POA: Diagnosis not present

## 2022-01-21 DIAGNOSIS — R918 Other nonspecific abnormal finding of lung field: Secondary | ICD-10-CM | POA: Diagnosis not present

## 2022-01-21 DIAGNOSIS — I517 Cardiomegaly: Secondary | ICD-10-CM | POA: Diagnosis not present

## 2022-01-21 DIAGNOSIS — N186 End stage renal disease: Secondary | ICD-10-CM | POA: Diagnosis not present

## 2022-01-21 DIAGNOSIS — J90 Pleural effusion, not elsewhere classified: Secondary | ICD-10-CM | POA: Diagnosis not present

## 2022-01-21 DIAGNOSIS — E8770 Fluid overload, unspecified: Secondary | ICD-10-CM | POA: Diagnosis not present

## 2022-01-21 DIAGNOSIS — I120 Hypertensive chronic kidney disease with stage 5 chronic kidney disease or end stage renal disease: Secondary | ICD-10-CM | POA: Diagnosis not present

## 2022-01-21 DIAGNOSIS — Z992 Dependence on renal dialysis: Secondary | ICD-10-CM | POA: Diagnosis not present

## 2022-01-21 DIAGNOSIS — E875 Hyperkalemia: Secondary | ICD-10-CM | POA: Diagnosis not present

## 2022-01-23 DIAGNOSIS — N186 End stage renal disease: Secondary | ICD-10-CM | POA: Diagnosis not present

## 2022-01-23 DIAGNOSIS — N2581 Secondary hyperparathyroidism of renal origin: Secondary | ICD-10-CM | POA: Diagnosis not present

## 2022-01-23 DIAGNOSIS — N250 Renal osteodystrophy: Secondary | ICD-10-CM | POA: Diagnosis not present

## 2022-01-23 DIAGNOSIS — D509 Iron deficiency anemia, unspecified: Secondary | ICD-10-CM | POA: Diagnosis not present

## 2022-01-23 DIAGNOSIS — D631 Anemia in chronic kidney disease: Secondary | ICD-10-CM | POA: Diagnosis not present

## 2022-01-23 DIAGNOSIS — Z992 Dependence on renal dialysis: Secondary | ICD-10-CM | POA: Diagnosis not present

## 2022-01-24 DIAGNOSIS — Z4789 Encounter for other orthopedic aftercare: Secondary | ICD-10-CM | POA: Diagnosis not present

## 2022-01-24 DIAGNOSIS — S22000A Wedge compression fracture of unspecified thoracic vertebra, initial encounter for closed fracture: Secondary | ICD-10-CM | POA: Diagnosis not present

## 2022-01-24 DIAGNOSIS — S22070G Wedge compression fracture of T9-T10 vertebra, subsequent encounter for fracture with delayed healing: Secondary | ICD-10-CM | POA: Diagnosis not present

## 2022-01-27 DIAGNOSIS — N186 End stage renal disease: Secondary | ICD-10-CM | POA: Diagnosis not present

## 2022-01-27 DIAGNOSIS — Z992 Dependence on renal dialysis: Secondary | ICD-10-CM | POA: Diagnosis not present

## 2022-01-28 DIAGNOSIS — N250 Renal osteodystrophy: Secondary | ICD-10-CM | POA: Diagnosis not present

## 2022-01-28 DIAGNOSIS — D631 Anemia in chronic kidney disease: Secondary | ICD-10-CM | POA: Diagnosis not present

## 2022-01-28 DIAGNOSIS — D509 Iron deficiency anemia, unspecified: Secondary | ICD-10-CM | POA: Diagnosis not present

## 2022-01-28 DIAGNOSIS — Z992 Dependence on renal dialysis: Secondary | ICD-10-CM | POA: Diagnosis not present

## 2022-01-28 DIAGNOSIS — N186 End stage renal disease: Secondary | ICD-10-CM | POA: Diagnosis not present

## 2022-01-28 DIAGNOSIS — N2581 Secondary hyperparathyroidism of renal origin: Secondary | ICD-10-CM | POA: Diagnosis not present

## 2022-01-29 DIAGNOSIS — M5414 Radiculopathy, thoracic region: Secondary | ICD-10-CM | POA: Diagnosis not present

## 2022-01-29 DIAGNOSIS — I120 Hypertensive chronic kidney disease with stage 5 chronic kidney disease or end stage renal disease: Secondary | ICD-10-CM | POA: Diagnosis not present

## 2022-01-29 DIAGNOSIS — N250 Renal osteodystrophy: Secondary | ICD-10-CM | POA: Diagnosis not present

## 2022-01-29 DIAGNOSIS — M4854XD Collapsed vertebra, not elsewhere classified, thoracic region, subsequent encounter for fracture with routine healing: Secondary | ICD-10-CM | POA: Diagnosis not present

## 2022-01-29 DIAGNOSIS — M4644 Discitis, unspecified, thoracic region: Secondary | ICD-10-CM | POA: Diagnosis not present

## 2022-01-29 DIAGNOSIS — Z452 Encounter for adjustment and management of vascular access device: Secondary | ICD-10-CM | POA: Diagnosis not present

## 2022-01-30 DIAGNOSIS — D509 Iron deficiency anemia, unspecified: Secondary | ICD-10-CM | POA: Diagnosis not present

## 2022-01-30 DIAGNOSIS — N186 End stage renal disease: Secondary | ICD-10-CM | POA: Diagnosis not present

## 2022-01-30 DIAGNOSIS — N2581 Secondary hyperparathyroidism of renal origin: Secondary | ICD-10-CM | POA: Diagnosis not present

## 2022-01-30 DIAGNOSIS — D631 Anemia in chronic kidney disease: Secondary | ICD-10-CM | POA: Diagnosis not present

## 2022-01-30 DIAGNOSIS — Z992 Dependence on renal dialysis: Secondary | ICD-10-CM | POA: Diagnosis not present

## 2022-01-30 DIAGNOSIS — N250 Renal osteodystrophy: Secondary | ICD-10-CM | POA: Diagnosis not present

## 2022-01-31 DIAGNOSIS — M549 Dorsalgia, unspecified: Secondary | ICD-10-CM | POA: Diagnosis not present

## 2022-01-31 DIAGNOSIS — N186 End stage renal disease: Secondary | ICD-10-CM | POA: Diagnosis not present

## 2022-01-31 DIAGNOSIS — Z6841 Body Mass Index (BMI) 40.0 and over, adult: Secondary | ICD-10-CM | POA: Diagnosis not present

## 2022-01-31 DIAGNOSIS — Z9889 Other specified postprocedural states: Secondary | ICD-10-CM | POA: Diagnosis not present

## 2022-01-31 DIAGNOSIS — Z7689 Persons encountering health services in other specified circumstances: Secondary | ICD-10-CM | POA: Diagnosis not present

## 2022-02-01 DIAGNOSIS — N186 End stage renal disease: Secondary | ICD-10-CM | POA: Diagnosis not present

## 2022-02-01 DIAGNOSIS — Z992 Dependence on renal dialysis: Secondary | ICD-10-CM | POA: Diagnosis not present

## 2022-02-01 DIAGNOSIS — D509 Iron deficiency anemia, unspecified: Secondary | ICD-10-CM | POA: Diagnosis not present

## 2022-02-01 DIAGNOSIS — D631 Anemia in chronic kidney disease: Secondary | ICD-10-CM | POA: Diagnosis not present

## 2022-02-01 DIAGNOSIS — N250 Renal osteodystrophy: Secondary | ICD-10-CM | POA: Diagnosis not present

## 2022-02-01 DIAGNOSIS — N2581 Secondary hyperparathyroidism of renal origin: Secondary | ICD-10-CM | POA: Diagnosis not present

## 2022-02-03 DIAGNOSIS — Z452 Encounter for adjustment and management of vascular access device: Secondary | ICD-10-CM | POA: Diagnosis not present

## 2022-02-03 DIAGNOSIS — M4644 Discitis, unspecified, thoracic region: Secondary | ICD-10-CM | POA: Diagnosis not present

## 2022-02-03 DIAGNOSIS — M4854XD Collapsed vertebra, not elsewhere classified, thoracic region, subsequent encounter for fracture with routine healing: Secondary | ICD-10-CM | POA: Diagnosis not present

## 2022-02-03 DIAGNOSIS — I120 Hypertensive chronic kidney disease with stage 5 chronic kidney disease or end stage renal disease: Secondary | ICD-10-CM | POA: Diagnosis not present

## 2022-02-03 DIAGNOSIS — M5414 Radiculopathy, thoracic region: Secondary | ICD-10-CM | POA: Diagnosis not present

## 2022-02-03 DIAGNOSIS — N250 Renal osteodystrophy: Secondary | ICD-10-CM | POA: Diagnosis not present

## 2022-02-04 DIAGNOSIS — Z992 Dependence on renal dialysis: Secondary | ICD-10-CM | POA: Diagnosis not present

## 2022-02-04 DIAGNOSIS — N2581 Secondary hyperparathyroidism of renal origin: Secondary | ICD-10-CM | POA: Diagnosis not present

## 2022-02-04 DIAGNOSIS — N186 End stage renal disease: Secondary | ICD-10-CM | POA: Diagnosis not present

## 2022-02-04 DIAGNOSIS — D509 Iron deficiency anemia, unspecified: Secondary | ICD-10-CM | POA: Diagnosis not present

## 2022-02-04 DIAGNOSIS — N250 Renal osteodystrophy: Secondary | ICD-10-CM | POA: Diagnosis not present

## 2022-02-04 DIAGNOSIS — D631 Anemia in chronic kidney disease: Secondary | ICD-10-CM | POA: Diagnosis not present

## 2022-02-05 DIAGNOSIS — Z452 Encounter for adjustment and management of vascular access device: Secondary | ICD-10-CM | POA: Diagnosis not present

## 2022-02-05 DIAGNOSIS — M4644 Discitis, unspecified, thoracic region: Secondary | ICD-10-CM | POA: Diagnosis not present

## 2022-02-05 DIAGNOSIS — I120 Hypertensive chronic kidney disease with stage 5 chronic kidney disease or end stage renal disease: Secondary | ICD-10-CM | POA: Diagnosis not present

## 2022-02-05 DIAGNOSIS — M4854XD Collapsed vertebra, not elsewhere classified, thoracic region, subsequent encounter for fracture with routine healing: Secondary | ICD-10-CM | POA: Diagnosis not present

## 2022-02-05 DIAGNOSIS — N250 Renal osteodystrophy: Secondary | ICD-10-CM | POA: Diagnosis not present

## 2022-02-05 DIAGNOSIS — M5414 Radiculopathy, thoracic region: Secondary | ICD-10-CM | POA: Diagnosis not present

## 2022-02-06 DIAGNOSIS — D509 Iron deficiency anemia, unspecified: Secondary | ICD-10-CM | POA: Diagnosis not present

## 2022-02-06 DIAGNOSIS — N2581 Secondary hyperparathyroidism of renal origin: Secondary | ICD-10-CM | POA: Diagnosis not present

## 2022-02-06 DIAGNOSIS — Z992 Dependence on renal dialysis: Secondary | ICD-10-CM | POA: Diagnosis not present

## 2022-02-06 DIAGNOSIS — N186 End stage renal disease: Secondary | ICD-10-CM | POA: Diagnosis not present

## 2022-02-06 DIAGNOSIS — D631 Anemia in chronic kidney disease: Secondary | ICD-10-CM | POA: Diagnosis not present

## 2022-02-06 DIAGNOSIS — N250 Renal osteodystrophy: Secondary | ICD-10-CM | POA: Diagnosis not present

## 2022-02-08 DIAGNOSIS — N186 End stage renal disease: Secondary | ICD-10-CM | POA: Diagnosis not present

## 2022-02-08 DIAGNOSIS — Z992 Dependence on renal dialysis: Secondary | ICD-10-CM | POA: Diagnosis not present

## 2022-02-08 DIAGNOSIS — D631 Anemia in chronic kidney disease: Secondary | ICD-10-CM | POA: Diagnosis not present

## 2022-02-08 DIAGNOSIS — N2581 Secondary hyperparathyroidism of renal origin: Secondary | ICD-10-CM | POA: Diagnosis not present

## 2022-02-08 DIAGNOSIS — D509 Iron deficiency anemia, unspecified: Secondary | ICD-10-CM | POA: Diagnosis not present

## 2022-02-08 DIAGNOSIS — N250 Renal osteodystrophy: Secondary | ICD-10-CM | POA: Diagnosis not present

## 2022-02-09 DIAGNOSIS — J9620 Acute and chronic respiratory failure, unspecified whether with hypoxia or hypercapnia: Secondary | ICD-10-CM | POA: Diagnosis not present

## 2022-02-09 DIAGNOSIS — I120 Hypertensive chronic kidney disease with stage 5 chronic kidney disease or end stage renal disease: Secondary | ICD-10-CM | POA: Diagnosis not present

## 2022-02-09 DIAGNOSIS — M1712 Unilateral primary osteoarthritis, left knee: Secondary | ICD-10-CM | POA: Diagnosis not present

## 2022-02-09 DIAGNOSIS — Z6841 Body Mass Index (BMI) 40.0 and over, adult: Secondary | ICD-10-CM | POA: Diagnosis not present

## 2022-02-09 DIAGNOSIS — Z86711 Personal history of pulmonary embolism: Secondary | ICD-10-CM | POA: Diagnosis not present

## 2022-02-09 DIAGNOSIS — N186 End stage renal disease: Secondary | ICD-10-CM | POA: Diagnosis not present

## 2022-02-09 DIAGNOSIS — M4644 Discitis, unspecified, thoracic region: Secondary | ICD-10-CM | POA: Diagnosis not present

## 2022-02-09 DIAGNOSIS — M5414 Radiculopathy, thoracic region: Secondary | ICD-10-CM | POA: Diagnosis not present

## 2022-02-09 DIAGNOSIS — M1A9XX0 Chronic gout, unspecified, without tophus (tophi): Secondary | ICD-10-CM | POA: Diagnosis not present

## 2022-02-09 DIAGNOSIS — N250 Renal osteodystrophy: Secondary | ICD-10-CM | POA: Diagnosis not present

## 2022-02-09 DIAGNOSIS — G2581 Restless legs syndrome: Secondary | ICD-10-CM | POA: Diagnosis not present

## 2022-02-09 DIAGNOSIS — M4854XD Collapsed vertebra, not elsewhere classified, thoracic region, subsequent encounter for fracture with routine healing: Secondary | ICD-10-CM | POA: Diagnosis not present

## 2022-02-09 DIAGNOSIS — Z452 Encounter for adjustment and management of vascular access device: Secondary | ICD-10-CM | POA: Diagnosis not present

## 2022-02-09 DIAGNOSIS — Z9981 Dependence on supplemental oxygen: Secondary | ICD-10-CM | POA: Diagnosis not present

## 2022-02-09 DIAGNOSIS — K219 Gastro-esophageal reflux disease without esophagitis: Secondary | ICD-10-CM | POA: Diagnosis not present

## 2022-02-09 DIAGNOSIS — Z79899 Other long term (current) drug therapy: Secondary | ICD-10-CM | POA: Diagnosis not present

## 2022-02-09 DIAGNOSIS — Z992 Dependence on renal dialysis: Secondary | ICD-10-CM | POA: Diagnosis not present

## 2022-02-09 DIAGNOSIS — D631 Anemia in chronic kidney disease: Secondary | ICD-10-CM | POA: Diagnosis not present

## 2022-02-11 DIAGNOSIS — N2581 Secondary hyperparathyroidism of renal origin: Secondary | ICD-10-CM | POA: Diagnosis not present

## 2022-02-11 DIAGNOSIS — N250 Renal osteodystrophy: Secondary | ICD-10-CM | POA: Diagnosis not present

## 2022-02-11 DIAGNOSIS — D509 Iron deficiency anemia, unspecified: Secondary | ICD-10-CM | POA: Diagnosis not present

## 2022-02-11 DIAGNOSIS — D631 Anemia in chronic kidney disease: Secondary | ICD-10-CM | POA: Diagnosis not present

## 2022-02-11 DIAGNOSIS — N186 End stage renal disease: Secondary | ICD-10-CM | POA: Diagnosis not present

## 2022-02-11 DIAGNOSIS — Z992 Dependence on renal dialysis: Secondary | ICD-10-CM | POA: Diagnosis not present

## 2022-02-12 DIAGNOSIS — Z452 Encounter for adjustment and management of vascular access device: Secondary | ICD-10-CM | POA: Diagnosis not present

## 2022-02-12 DIAGNOSIS — N250 Renal osteodystrophy: Secondary | ICD-10-CM | POA: Diagnosis not present

## 2022-02-12 DIAGNOSIS — M5414 Radiculopathy, thoracic region: Secondary | ICD-10-CM | POA: Diagnosis not present

## 2022-02-12 DIAGNOSIS — M4854XD Collapsed vertebra, not elsewhere classified, thoracic region, subsequent encounter for fracture with routine healing: Secondary | ICD-10-CM | POA: Diagnosis not present

## 2022-02-12 DIAGNOSIS — M4644 Discitis, unspecified, thoracic region: Secondary | ICD-10-CM | POA: Diagnosis not present

## 2022-02-12 DIAGNOSIS — I120 Hypertensive chronic kidney disease with stage 5 chronic kidney disease or end stage renal disease: Secondary | ICD-10-CM | POA: Diagnosis not present

## 2022-02-13 DIAGNOSIS — D631 Anemia in chronic kidney disease: Secondary | ICD-10-CM | POA: Diagnosis not present

## 2022-02-13 DIAGNOSIS — N186 End stage renal disease: Secondary | ICD-10-CM | POA: Diagnosis not present

## 2022-02-13 DIAGNOSIS — D509 Iron deficiency anemia, unspecified: Secondary | ICD-10-CM | POA: Diagnosis not present

## 2022-02-13 DIAGNOSIS — N2581 Secondary hyperparathyroidism of renal origin: Secondary | ICD-10-CM | POA: Diagnosis not present

## 2022-02-13 DIAGNOSIS — N250 Renal osteodystrophy: Secondary | ICD-10-CM | POA: Diagnosis not present

## 2022-02-13 DIAGNOSIS — Z992 Dependence on renal dialysis: Secondary | ICD-10-CM | POA: Diagnosis not present

## 2022-02-15 DIAGNOSIS — Z992 Dependence on renal dialysis: Secondary | ICD-10-CM | POA: Diagnosis not present

## 2022-02-15 DIAGNOSIS — D631 Anemia in chronic kidney disease: Secondary | ICD-10-CM | POA: Diagnosis not present

## 2022-02-15 DIAGNOSIS — N250 Renal osteodystrophy: Secondary | ICD-10-CM | POA: Diagnosis not present

## 2022-02-15 DIAGNOSIS — D509 Iron deficiency anemia, unspecified: Secondary | ICD-10-CM | POA: Diagnosis not present

## 2022-02-15 DIAGNOSIS — N2581 Secondary hyperparathyroidism of renal origin: Secondary | ICD-10-CM | POA: Diagnosis not present

## 2022-02-15 DIAGNOSIS — N186 End stage renal disease: Secondary | ICD-10-CM | POA: Diagnosis not present

## 2022-02-18 DIAGNOSIS — Z992 Dependence on renal dialysis: Secondary | ICD-10-CM | POA: Diagnosis not present

## 2022-02-18 DIAGNOSIS — N186 End stage renal disease: Secondary | ICD-10-CM | POA: Diagnosis not present

## 2022-02-18 DIAGNOSIS — D631 Anemia in chronic kidney disease: Secondary | ICD-10-CM | POA: Diagnosis not present

## 2022-02-18 DIAGNOSIS — D509 Iron deficiency anemia, unspecified: Secondary | ICD-10-CM | POA: Diagnosis not present

## 2022-02-18 DIAGNOSIS — N250 Renal osteodystrophy: Secondary | ICD-10-CM | POA: Diagnosis not present

## 2022-02-18 DIAGNOSIS — N2581 Secondary hyperparathyroidism of renal origin: Secondary | ICD-10-CM | POA: Diagnosis not present

## 2022-02-19 DIAGNOSIS — I120 Hypertensive chronic kidney disease with stage 5 chronic kidney disease or end stage renal disease: Secondary | ICD-10-CM | POA: Diagnosis not present

## 2022-02-19 DIAGNOSIS — M4644 Discitis, unspecified, thoracic region: Secondary | ICD-10-CM | POA: Diagnosis not present

## 2022-02-19 DIAGNOSIS — M5414 Radiculopathy, thoracic region: Secondary | ICD-10-CM | POA: Diagnosis not present

## 2022-02-19 DIAGNOSIS — Z452 Encounter for adjustment and management of vascular access device: Secondary | ICD-10-CM | POA: Diagnosis not present

## 2022-02-19 DIAGNOSIS — M4854XD Collapsed vertebra, not elsewhere classified, thoracic region, subsequent encounter for fracture with routine healing: Secondary | ICD-10-CM | POA: Diagnosis not present

## 2022-02-19 DIAGNOSIS — N250 Renal osteodystrophy: Secondary | ICD-10-CM | POA: Diagnosis not present

## 2022-02-20 DIAGNOSIS — Z992 Dependence on renal dialysis: Secondary | ICD-10-CM | POA: Diagnosis not present

## 2022-02-20 DIAGNOSIS — N186 End stage renal disease: Secondary | ICD-10-CM | POA: Diagnosis not present

## 2022-02-20 DIAGNOSIS — D631 Anemia in chronic kidney disease: Secondary | ICD-10-CM | POA: Diagnosis not present

## 2022-02-20 DIAGNOSIS — D509 Iron deficiency anemia, unspecified: Secondary | ICD-10-CM | POA: Diagnosis not present

## 2022-02-20 DIAGNOSIS — N2581 Secondary hyperparathyroidism of renal origin: Secondary | ICD-10-CM | POA: Diagnosis not present

## 2022-02-20 DIAGNOSIS — N250 Renal osteodystrophy: Secondary | ICD-10-CM | POA: Diagnosis not present

## 2022-02-21 DIAGNOSIS — I120 Hypertensive chronic kidney disease with stage 5 chronic kidney disease or end stage renal disease: Secondary | ICD-10-CM | POA: Diagnosis not present

## 2022-02-21 DIAGNOSIS — M4644 Discitis, unspecified, thoracic region: Secondary | ICD-10-CM | POA: Diagnosis not present

## 2022-02-21 DIAGNOSIS — M5414 Radiculopathy, thoracic region: Secondary | ICD-10-CM | POA: Diagnosis not present

## 2022-02-21 DIAGNOSIS — N250 Renal osteodystrophy: Secondary | ICD-10-CM | POA: Diagnosis not present

## 2022-02-21 DIAGNOSIS — M4854XD Collapsed vertebra, not elsewhere classified, thoracic region, subsequent encounter for fracture with routine healing: Secondary | ICD-10-CM | POA: Diagnosis not present

## 2022-02-21 DIAGNOSIS — Z452 Encounter for adjustment and management of vascular access device: Secondary | ICD-10-CM | POA: Diagnosis not present

## 2022-02-22 DIAGNOSIS — N250 Renal osteodystrophy: Secondary | ICD-10-CM | POA: Diagnosis not present

## 2022-02-22 DIAGNOSIS — N2581 Secondary hyperparathyroidism of renal origin: Secondary | ICD-10-CM | POA: Diagnosis not present

## 2022-02-22 DIAGNOSIS — D509 Iron deficiency anemia, unspecified: Secondary | ICD-10-CM | POA: Diagnosis not present

## 2022-02-22 DIAGNOSIS — N186 End stage renal disease: Secondary | ICD-10-CM | POA: Diagnosis not present

## 2022-02-22 DIAGNOSIS — Z992 Dependence on renal dialysis: Secondary | ICD-10-CM | POA: Diagnosis not present

## 2022-02-22 DIAGNOSIS — D631 Anemia in chronic kidney disease: Secondary | ICD-10-CM | POA: Diagnosis not present

## 2022-02-25 DIAGNOSIS — D631 Anemia in chronic kidney disease: Secondary | ICD-10-CM | POA: Diagnosis not present

## 2022-02-25 DIAGNOSIS — M4856XA Collapsed vertebra, not elsewhere classified, lumbar region, initial encounter for fracture: Secondary | ICD-10-CM | POA: Diagnosis not present

## 2022-02-25 DIAGNOSIS — Z981 Arthrodesis status: Secondary | ICD-10-CM | POA: Diagnosis not present

## 2022-02-25 DIAGNOSIS — D509 Iron deficiency anemia, unspecified: Secondary | ICD-10-CM | POA: Diagnosis not present

## 2022-02-25 DIAGNOSIS — N250 Renal osteodystrophy: Secondary | ICD-10-CM | POA: Diagnosis not present

## 2022-02-25 DIAGNOSIS — S22070D Wedge compression fracture of T9-T10 vertebra, subsequent encounter for fracture with routine healing: Secondary | ICD-10-CM | POA: Diagnosis not present

## 2022-02-25 DIAGNOSIS — N2581 Secondary hyperparathyroidism of renal origin: Secondary | ICD-10-CM | POA: Diagnosis not present

## 2022-02-25 DIAGNOSIS — N186 End stage renal disease: Secondary | ICD-10-CM | POA: Diagnosis not present

## 2022-02-25 DIAGNOSIS — Z992 Dependence on renal dialysis: Secondary | ICD-10-CM | POA: Diagnosis not present

## 2022-02-25 DIAGNOSIS — M4312 Spondylolisthesis, cervical region: Secondary | ICD-10-CM | POA: Diagnosis not present

## 2022-02-27 DIAGNOSIS — N250 Renal osteodystrophy: Secondary | ICD-10-CM | POA: Diagnosis not present

## 2022-02-27 DIAGNOSIS — N186 End stage renal disease: Secondary | ICD-10-CM | POA: Diagnosis not present

## 2022-02-27 DIAGNOSIS — Z992 Dependence on renal dialysis: Secondary | ICD-10-CM | POA: Diagnosis not present

## 2022-02-27 DIAGNOSIS — N2581 Secondary hyperparathyroidism of renal origin: Secondary | ICD-10-CM | POA: Diagnosis not present

## 2022-02-27 DIAGNOSIS — D509 Iron deficiency anemia, unspecified: Secondary | ICD-10-CM | POA: Diagnosis not present

## 2022-02-27 DIAGNOSIS — D631 Anemia in chronic kidney disease: Secondary | ICD-10-CM | POA: Diagnosis not present

## 2022-03-01 DIAGNOSIS — Z992 Dependence on renal dialysis: Secondary | ICD-10-CM | POA: Diagnosis not present

## 2022-03-01 DIAGNOSIS — N186 End stage renal disease: Secondary | ICD-10-CM | POA: Diagnosis not present

## 2022-03-01 DIAGNOSIS — N2581 Secondary hyperparathyroidism of renal origin: Secondary | ICD-10-CM | POA: Diagnosis not present

## 2022-03-01 DIAGNOSIS — L03116 Cellulitis of left lower limb: Secondary | ICD-10-CM | POA: Diagnosis not present

## 2022-03-01 DIAGNOSIS — L03115 Cellulitis of right lower limb: Secondary | ICD-10-CM | POA: Diagnosis not present

## 2022-03-01 DIAGNOSIS — D509 Iron deficiency anemia, unspecified: Secondary | ICD-10-CM | POA: Diagnosis not present

## 2022-03-01 DIAGNOSIS — D631 Anemia in chronic kidney disease: Secondary | ICD-10-CM | POA: Diagnosis not present

## 2022-03-01 DIAGNOSIS — N250 Renal osteodystrophy: Secondary | ICD-10-CM | POA: Diagnosis not present

## 2022-03-04 DIAGNOSIS — L03116 Cellulitis of left lower limb: Secondary | ICD-10-CM | POA: Diagnosis not present

## 2022-03-04 DIAGNOSIS — L03115 Cellulitis of right lower limb: Secondary | ICD-10-CM | POA: Diagnosis not present

## 2022-03-04 DIAGNOSIS — D509 Iron deficiency anemia, unspecified: Secondary | ICD-10-CM | POA: Diagnosis not present

## 2022-03-04 DIAGNOSIS — Z992 Dependence on renal dialysis: Secondary | ICD-10-CM | POA: Diagnosis not present

## 2022-03-04 DIAGNOSIS — N2581 Secondary hyperparathyroidism of renal origin: Secondary | ICD-10-CM | POA: Diagnosis not present

## 2022-03-04 DIAGNOSIS — N186 End stage renal disease: Secondary | ICD-10-CM | POA: Diagnosis not present

## 2022-03-05 DIAGNOSIS — M5414 Radiculopathy, thoracic region: Secondary | ICD-10-CM | POA: Diagnosis not present

## 2022-03-05 DIAGNOSIS — M4644 Discitis, unspecified, thoracic region: Secondary | ICD-10-CM | POA: Diagnosis not present

## 2022-03-05 DIAGNOSIS — Z452 Encounter for adjustment and management of vascular access device: Secondary | ICD-10-CM | POA: Diagnosis not present

## 2022-03-05 DIAGNOSIS — M4854XD Collapsed vertebra, not elsewhere classified, thoracic region, subsequent encounter for fracture with routine healing: Secondary | ICD-10-CM | POA: Diagnosis not present

## 2022-03-05 DIAGNOSIS — I120 Hypertensive chronic kidney disease with stage 5 chronic kidney disease or end stage renal disease: Secondary | ICD-10-CM | POA: Diagnosis not present

## 2022-03-05 DIAGNOSIS — N250 Renal osteodystrophy: Secondary | ICD-10-CM | POA: Diagnosis not present

## 2022-03-06 DIAGNOSIS — L03115 Cellulitis of right lower limb: Secondary | ICD-10-CM | POA: Diagnosis not present

## 2022-03-06 DIAGNOSIS — N2581 Secondary hyperparathyroidism of renal origin: Secondary | ICD-10-CM | POA: Diagnosis not present

## 2022-03-06 DIAGNOSIS — Z992 Dependence on renal dialysis: Secondary | ICD-10-CM | POA: Diagnosis not present

## 2022-03-06 DIAGNOSIS — D509 Iron deficiency anemia, unspecified: Secondary | ICD-10-CM | POA: Diagnosis not present

## 2022-03-06 DIAGNOSIS — L03116 Cellulitis of left lower limb: Secondary | ICD-10-CM | POA: Diagnosis not present

## 2022-03-06 DIAGNOSIS — N186 End stage renal disease: Secondary | ICD-10-CM | POA: Diagnosis not present

## 2022-03-08 DIAGNOSIS — N2581 Secondary hyperparathyroidism of renal origin: Secondary | ICD-10-CM | POA: Diagnosis not present

## 2022-03-08 DIAGNOSIS — N186 End stage renal disease: Secondary | ICD-10-CM | POA: Diagnosis not present

## 2022-03-08 DIAGNOSIS — Z992 Dependence on renal dialysis: Secondary | ICD-10-CM | POA: Diagnosis not present

## 2022-03-08 DIAGNOSIS — D509 Iron deficiency anemia, unspecified: Secondary | ICD-10-CM | POA: Diagnosis not present

## 2022-03-08 DIAGNOSIS — L03116 Cellulitis of left lower limb: Secondary | ICD-10-CM | POA: Diagnosis not present

## 2022-03-08 DIAGNOSIS — L03115 Cellulitis of right lower limb: Secondary | ICD-10-CM | POA: Diagnosis not present

## 2022-03-11 DIAGNOSIS — D509 Iron deficiency anemia, unspecified: Secondary | ICD-10-CM | POA: Diagnosis not present

## 2022-03-11 DIAGNOSIS — N2581 Secondary hyperparathyroidism of renal origin: Secondary | ICD-10-CM | POA: Diagnosis not present

## 2022-03-11 DIAGNOSIS — Z992 Dependence on renal dialysis: Secondary | ICD-10-CM | POA: Diagnosis not present

## 2022-03-11 DIAGNOSIS — L03115 Cellulitis of right lower limb: Secondary | ICD-10-CM | POA: Diagnosis not present

## 2022-03-11 DIAGNOSIS — L03116 Cellulitis of left lower limb: Secondary | ICD-10-CM | POA: Diagnosis not present

## 2022-03-11 DIAGNOSIS — N186 End stage renal disease: Secondary | ICD-10-CM | POA: Diagnosis not present

## 2022-03-13 DIAGNOSIS — L03115 Cellulitis of right lower limb: Secondary | ICD-10-CM | POA: Diagnosis not present

## 2022-03-13 DIAGNOSIS — L03116 Cellulitis of left lower limb: Secondary | ICD-10-CM | POA: Diagnosis not present

## 2022-03-13 DIAGNOSIS — D509 Iron deficiency anemia, unspecified: Secondary | ICD-10-CM | POA: Diagnosis not present

## 2022-03-13 DIAGNOSIS — Z992 Dependence on renal dialysis: Secondary | ICD-10-CM | POA: Diagnosis not present

## 2022-03-13 DIAGNOSIS — N2581 Secondary hyperparathyroidism of renal origin: Secondary | ICD-10-CM | POA: Diagnosis not present

## 2022-03-13 DIAGNOSIS — N186 End stage renal disease: Secondary | ICD-10-CM | POA: Diagnosis not present

## 2022-03-15 DIAGNOSIS — N2581 Secondary hyperparathyroidism of renal origin: Secondary | ICD-10-CM | POA: Diagnosis not present

## 2022-03-15 DIAGNOSIS — N186 End stage renal disease: Secondary | ICD-10-CM | POA: Diagnosis not present

## 2022-03-15 DIAGNOSIS — D509 Iron deficiency anemia, unspecified: Secondary | ICD-10-CM | POA: Diagnosis not present

## 2022-03-15 DIAGNOSIS — L03116 Cellulitis of left lower limb: Secondary | ICD-10-CM | POA: Diagnosis not present

## 2022-03-15 DIAGNOSIS — L03115 Cellulitis of right lower limb: Secondary | ICD-10-CM | POA: Diagnosis not present

## 2022-03-15 DIAGNOSIS — Z992 Dependence on renal dialysis: Secondary | ICD-10-CM | POA: Diagnosis not present

## 2022-03-18 DIAGNOSIS — L03115 Cellulitis of right lower limb: Secondary | ICD-10-CM | POA: Diagnosis not present

## 2022-03-18 DIAGNOSIS — D509 Iron deficiency anemia, unspecified: Secondary | ICD-10-CM | POA: Diagnosis not present

## 2022-03-18 DIAGNOSIS — L03116 Cellulitis of left lower limb: Secondary | ICD-10-CM | POA: Diagnosis not present

## 2022-03-18 DIAGNOSIS — N2581 Secondary hyperparathyroidism of renal origin: Secondary | ICD-10-CM | POA: Diagnosis not present

## 2022-03-18 DIAGNOSIS — N186 End stage renal disease: Secondary | ICD-10-CM | POA: Diagnosis not present

## 2022-03-18 DIAGNOSIS — Z992 Dependence on renal dialysis: Secondary | ICD-10-CM | POA: Diagnosis not present

## 2022-03-20 DIAGNOSIS — L03115 Cellulitis of right lower limb: Secondary | ICD-10-CM | POA: Diagnosis not present

## 2022-03-20 DIAGNOSIS — D509 Iron deficiency anemia, unspecified: Secondary | ICD-10-CM | POA: Diagnosis not present

## 2022-03-20 DIAGNOSIS — Z9889 Other specified postprocedural states: Secondary | ICD-10-CM | POA: Diagnosis not present

## 2022-03-20 DIAGNOSIS — L03116 Cellulitis of left lower limb: Secondary | ICD-10-CM | POA: Diagnosis not present

## 2022-03-20 DIAGNOSIS — M549 Dorsalgia, unspecified: Secondary | ICD-10-CM | POA: Diagnosis not present

## 2022-03-20 DIAGNOSIS — Z992 Dependence on renal dialysis: Secondary | ICD-10-CM | POA: Diagnosis not present

## 2022-03-20 DIAGNOSIS — Z1331 Encounter for screening for depression: Secondary | ICD-10-CM | POA: Diagnosis not present

## 2022-03-20 DIAGNOSIS — N2581 Secondary hyperparathyroidism of renal origin: Secondary | ICD-10-CM | POA: Diagnosis not present

## 2022-03-20 DIAGNOSIS — N186 End stage renal disease: Secondary | ICD-10-CM | POA: Diagnosis not present

## 2022-03-20 DIAGNOSIS — Z6841 Body Mass Index (BMI) 40.0 and over, adult: Secondary | ICD-10-CM | POA: Diagnosis not present

## 2022-03-22 DIAGNOSIS — N2581 Secondary hyperparathyroidism of renal origin: Secondary | ICD-10-CM | POA: Diagnosis not present

## 2022-03-22 DIAGNOSIS — L03116 Cellulitis of left lower limb: Secondary | ICD-10-CM | POA: Diagnosis not present

## 2022-03-22 DIAGNOSIS — N186 End stage renal disease: Secondary | ICD-10-CM | POA: Diagnosis not present

## 2022-03-22 DIAGNOSIS — D509 Iron deficiency anemia, unspecified: Secondary | ICD-10-CM | POA: Diagnosis not present

## 2022-03-22 DIAGNOSIS — Z992 Dependence on renal dialysis: Secondary | ICD-10-CM | POA: Diagnosis not present

## 2022-03-22 DIAGNOSIS — L03115 Cellulitis of right lower limb: Secondary | ICD-10-CM | POA: Diagnosis not present

## 2022-03-25 DIAGNOSIS — L03116 Cellulitis of left lower limb: Secondary | ICD-10-CM | POA: Diagnosis not present

## 2022-03-25 DIAGNOSIS — D509 Iron deficiency anemia, unspecified: Secondary | ICD-10-CM | POA: Diagnosis not present

## 2022-03-25 DIAGNOSIS — L03115 Cellulitis of right lower limb: Secondary | ICD-10-CM | POA: Diagnosis not present

## 2022-03-25 DIAGNOSIS — N186 End stage renal disease: Secondary | ICD-10-CM | POA: Diagnosis not present

## 2022-03-25 DIAGNOSIS — Z992 Dependence on renal dialysis: Secondary | ICD-10-CM | POA: Diagnosis not present

## 2022-03-25 DIAGNOSIS — N2581 Secondary hyperparathyroidism of renal origin: Secondary | ICD-10-CM | POA: Diagnosis not present

## 2022-03-29 DIAGNOSIS — N186 End stage renal disease: Secondary | ICD-10-CM | POA: Diagnosis not present

## 2022-03-29 DIAGNOSIS — L03115 Cellulitis of right lower limb: Secondary | ICD-10-CM | POA: Diagnosis not present

## 2022-03-29 DIAGNOSIS — Z992 Dependence on renal dialysis: Secondary | ICD-10-CM | POA: Diagnosis not present

## 2022-03-29 DIAGNOSIS — D509 Iron deficiency anemia, unspecified: Secondary | ICD-10-CM | POA: Diagnosis not present

## 2022-03-29 DIAGNOSIS — N2581 Secondary hyperparathyroidism of renal origin: Secondary | ICD-10-CM | POA: Diagnosis not present

## 2022-03-29 DIAGNOSIS — L03116 Cellulitis of left lower limb: Secondary | ICD-10-CM | POA: Diagnosis not present

## 2022-04-01 DIAGNOSIS — D631 Anemia in chronic kidney disease: Secondary | ICD-10-CM | POA: Diagnosis not present

## 2022-04-01 DIAGNOSIS — Z992 Dependence on renal dialysis: Secondary | ICD-10-CM | POA: Diagnosis not present

## 2022-04-01 DIAGNOSIS — N250 Renal osteodystrophy: Secondary | ICD-10-CM | POA: Diagnosis not present

## 2022-04-01 DIAGNOSIS — L03115 Cellulitis of right lower limb: Secondary | ICD-10-CM | POA: Diagnosis not present

## 2022-04-01 DIAGNOSIS — N2581 Secondary hyperparathyroidism of renal origin: Secondary | ICD-10-CM | POA: Diagnosis not present

## 2022-04-01 DIAGNOSIS — D509 Iron deficiency anemia, unspecified: Secondary | ICD-10-CM | POA: Diagnosis not present

## 2022-04-01 DIAGNOSIS — L03116 Cellulitis of left lower limb: Secondary | ICD-10-CM | POA: Diagnosis not present

## 2022-04-01 DIAGNOSIS — N186 End stage renal disease: Secondary | ICD-10-CM | POA: Diagnosis not present

## 2022-04-05 DIAGNOSIS — N186 End stage renal disease: Secondary | ICD-10-CM | POA: Diagnosis not present

## 2022-04-05 DIAGNOSIS — L03115 Cellulitis of right lower limb: Secondary | ICD-10-CM | POA: Diagnosis not present

## 2022-04-05 DIAGNOSIS — Z992 Dependence on renal dialysis: Secondary | ICD-10-CM | POA: Diagnosis not present

## 2022-04-05 DIAGNOSIS — N2581 Secondary hyperparathyroidism of renal origin: Secondary | ICD-10-CM | POA: Diagnosis not present

## 2022-04-05 DIAGNOSIS — L03116 Cellulitis of left lower limb: Secondary | ICD-10-CM | POA: Diagnosis not present

## 2022-04-05 DIAGNOSIS — D509 Iron deficiency anemia, unspecified: Secondary | ICD-10-CM | POA: Diagnosis not present

## 2022-04-08 DIAGNOSIS — N2581 Secondary hyperparathyroidism of renal origin: Secondary | ICD-10-CM | POA: Diagnosis not present

## 2022-04-08 DIAGNOSIS — D509 Iron deficiency anemia, unspecified: Secondary | ICD-10-CM | POA: Diagnosis not present

## 2022-04-08 DIAGNOSIS — L03116 Cellulitis of left lower limb: Secondary | ICD-10-CM | POA: Diagnosis not present

## 2022-04-08 DIAGNOSIS — N186 End stage renal disease: Secondary | ICD-10-CM | POA: Diagnosis not present

## 2022-04-08 DIAGNOSIS — L03115 Cellulitis of right lower limb: Secondary | ICD-10-CM | POA: Diagnosis not present

## 2022-04-08 DIAGNOSIS — Z992 Dependence on renal dialysis: Secondary | ICD-10-CM | POA: Diagnosis not present

## 2022-04-10 DIAGNOSIS — N186 End stage renal disease: Secondary | ICD-10-CM | POA: Diagnosis not present

## 2022-04-10 DIAGNOSIS — L03115 Cellulitis of right lower limb: Secondary | ICD-10-CM | POA: Diagnosis not present

## 2022-04-10 DIAGNOSIS — D509 Iron deficiency anemia, unspecified: Secondary | ICD-10-CM | POA: Diagnosis not present

## 2022-04-10 DIAGNOSIS — Z992 Dependence on renal dialysis: Secondary | ICD-10-CM | POA: Diagnosis not present

## 2022-04-10 DIAGNOSIS — L03116 Cellulitis of left lower limb: Secondary | ICD-10-CM | POA: Diagnosis not present

## 2022-04-10 DIAGNOSIS — N2581 Secondary hyperparathyroidism of renal origin: Secondary | ICD-10-CM | POA: Diagnosis not present

## 2022-04-12 DIAGNOSIS — N2581 Secondary hyperparathyroidism of renal origin: Secondary | ICD-10-CM | POA: Diagnosis not present

## 2022-04-12 DIAGNOSIS — N186 End stage renal disease: Secondary | ICD-10-CM | POA: Diagnosis not present

## 2022-04-12 DIAGNOSIS — L03115 Cellulitis of right lower limb: Secondary | ICD-10-CM | POA: Diagnosis not present

## 2022-04-12 DIAGNOSIS — D509 Iron deficiency anemia, unspecified: Secondary | ICD-10-CM | POA: Diagnosis not present

## 2022-04-12 DIAGNOSIS — Z992 Dependence on renal dialysis: Secondary | ICD-10-CM | POA: Diagnosis not present

## 2022-04-12 DIAGNOSIS — L03116 Cellulitis of left lower limb: Secondary | ICD-10-CM | POA: Diagnosis not present

## 2022-04-14 DIAGNOSIS — Z6841 Body Mass Index (BMI) 40.0 and over, adult: Secondary | ICD-10-CM | POA: Diagnosis not present

## 2022-04-14 DIAGNOSIS — I120 Hypertensive chronic kidney disease with stage 5 chronic kidney disease or end stage renal disease: Secondary | ICD-10-CM | POA: Diagnosis not present

## 2022-04-14 DIAGNOSIS — T82898A Other specified complication of vascular prosthetic devices, implants and grafts, initial encounter: Secondary | ICD-10-CM | POA: Diagnosis not present

## 2022-04-14 DIAGNOSIS — R600 Localized edema: Secondary | ICD-10-CM | POA: Diagnosis not present

## 2022-04-14 DIAGNOSIS — Z992 Dependence on renal dialysis: Secondary | ICD-10-CM | POA: Diagnosis not present

## 2022-04-14 DIAGNOSIS — Z23 Encounter for immunization: Secondary | ICD-10-CM | POA: Diagnosis not present

## 2022-04-14 DIAGNOSIS — Z79899 Other long term (current) drug therapy: Secondary | ICD-10-CM | POA: Diagnosis not present

## 2022-04-14 DIAGNOSIS — Z1231 Encounter for screening mammogram for malignant neoplasm of breast: Secondary | ICD-10-CM | POA: Diagnosis not present

## 2022-04-14 DIAGNOSIS — N186 End stage renal disease: Secondary | ICD-10-CM | POA: Diagnosis not present

## 2022-04-14 DIAGNOSIS — T82858A Stenosis of vascular prosthetic devices, implants and grafts, initial encounter: Secondary | ICD-10-CM | POA: Diagnosis not present

## 2022-04-14 DIAGNOSIS — Z86711 Personal history of pulmonary embolism: Secondary | ICD-10-CM | POA: Diagnosis not present

## 2022-04-15 DIAGNOSIS — L03116 Cellulitis of left lower limb: Secondary | ICD-10-CM | POA: Diagnosis not present

## 2022-04-15 DIAGNOSIS — Z992 Dependence on renal dialysis: Secondary | ICD-10-CM | POA: Diagnosis not present

## 2022-04-15 DIAGNOSIS — N2581 Secondary hyperparathyroidism of renal origin: Secondary | ICD-10-CM | POA: Diagnosis not present

## 2022-04-15 DIAGNOSIS — N186 End stage renal disease: Secondary | ICD-10-CM | POA: Diagnosis not present

## 2022-04-15 DIAGNOSIS — L03115 Cellulitis of right lower limb: Secondary | ICD-10-CM | POA: Diagnosis not present

## 2022-04-15 DIAGNOSIS — D509 Iron deficiency anemia, unspecified: Secondary | ICD-10-CM | POA: Diagnosis not present

## 2022-04-17 DIAGNOSIS — D509 Iron deficiency anemia, unspecified: Secondary | ICD-10-CM | POA: Diagnosis not present

## 2022-04-17 DIAGNOSIS — N186 End stage renal disease: Secondary | ICD-10-CM | POA: Diagnosis not present

## 2022-04-17 DIAGNOSIS — L03116 Cellulitis of left lower limb: Secondary | ICD-10-CM | POA: Diagnosis not present

## 2022-04-17 DIAGNOSIS — N2581 Secondary hyperparathyroidism of renal origin: Secondary | ICD-10-CM | POA: Diagnosis not present

## 2022-04-17 DIAGNOSIS — Z992 Dependence on renal dialysis: Secondary | ICD-10-CM | POA: Diagnosis not present

## 2022-04-17 DIAGNOSIS — L03115 Cellulitis of right lower limb: Secondary | ICD-10-CM | POA: Diagnosis not present

## 2022-04-19 DIAGNOSIS — D509 Iron deficiency anemia, unspecified: Secondary | ICD-10-CM | POA: Diagnosis not present

## 2022-04-19 DIAGNOSIS — N2581 Secondary hyperparathyroidism of renal origin: Secondary | ICD-10-CM | POA: Diagnosis not present

## 2022-04-19 DIAGNOSIS — L03116 Cellulitis of left lower limb: Secondary | ICD-10-CM | POA: Diagnosis not present

## 2022-04-19 DIAGNOSIS — Z992 Dependence on renal dialysis: Secondary | ICD-10-CM | POA: Diagnosis not present

## 2022-04-19 DIAGNOSIS — L03115 Cellulitis of right lower limb: Secondary | ICD-10-CM | POA: Diagnosis not present

## 2022-04-19 DIAGNOSIS — N186 End stage renal disease: Secondary | ICD-10-CM | POA: Diagnosis not present

## 2022-04-22 DIAGNOSIS — L03116 Cellulitis of left lower limb: Secondary | ICD-10-CM | POA: Diagnosis not present

## 2022-04-22 DIAGNOSIS — D509 Iron deficiency anemia, unspecified: Secondary | ICD-10-CM | POA: Diagnosis not present

## 2022-04-22 DIAGNOSIS — N186 End stage renal disease: Secondary | ICD-10-CM | POA: Diagnosis not present

## 2022-04-22 DIAGNOSIS — N2581 Secondary hyperparathyroidism of renal origin: Secondary | ICD-10-CM | POA: Diagnosis not present

## 2022-04-22 DIAGNOSIS — Z992 Dependence on renal dialysis: Secondary | ICD-10-CM | POA: Diagnosis not present

## 2022-04-22 DIAGNOSIS — L03115 Cellulitis of right lower limb: Secondary | ICD-10-CM | POA: Diagnosis not present

## 2022-04-24 DIAGNOSIS — D509 Iron deficiency anemia, unspecified: Secondary | ICD-10-CM | POA: Diagnosis not present

## 2022-04-24 DIAGNOSIS — N186 End stage renal disease: Secondary | ICD-10-CM | POA: Diagnosis not present

## 2022-04-24 DIAGNOSIS — N2581 Secondary hyperparathyroidism of renal origin: Secondary | ICD-10-CM | POA: Diagnosis not present

## 2022-04-24 DIAGNOSIS — Z992 Dependence on renal dialysis: Secondary | ICD-10-CM | POA: Diagnosis not present

## 2022-04-24 DIAGNOSIS — L03116 Cellulitis of left lower limb: Secondary | ICD-10-CM | POA: Diagnosis not present

## 2022-04-24 DIAGNOSIS — L03115 Cellulitis of right lower limb: Secondary | ICD-10-CM | POA: Diagnosis not present

## 2022-04-26 DIAGNOSIS — N2581 Secondary hyperparathyroidism of renal origin: Secondary | ICD-10-CM | POA: Diagnosis not present

## 2022-04-26 DIAGNOSIS — D509 Iron deficiency anemia, unspecified: Secondary | ICD-10-CM | POA: Diagnosis not present

## 2022-04-26 DIAGNOSIS — Z992 Dependence on renal dialysis: Secondary | ICD-10-CM | POA: Diagnosis not present

## 2022-04-26 DIAGNOSIS — L03115 Cellulitis of right lower limb: Secondary | ICD-10-CM | POA: Diagnosis not present

## 2022-04-26 DIAGNOSIS — L03116 Cellulitis of left lower limb: Secondary | ICD-10-CM | POA: Diagnosis not present

## 2022-04-26 DIAGNOSIS — N186 End stage renal disease: Secondary | ICD-10-CM | POA: Diagnosis not present

## 2022-04-29 DIAGNOSIS — N186 End stage renal disease: Secondary | ICD-10-CM | POA: Diagnosis not present

## 2022-04-29 DIAGNOSIS — Z992 Dependence on renal dialysis: Secondary | ICD-10-CM | POA: Diagnosis not present

## 2022-04-29 DIAGNOSIS — L03116 Cellulitis of left lower limb: Secondary | ICD-10-CM | POA: Diagnosis not present

## 2022-04-29 DIAGNOSIS — N2581 Secondary hyperparathyroidism of renal origin: Secondary | ICD-10-CM | POA: Diagnosis not present

## 2022-04-29 DIAGNOSIS — L03115 Cellulitis of right lower limb: Secondary | ICD-10-CM | POA: Diagnosis not present

## 2022-04-29 DIAGNOSIS — D509 Iron deficiency anemia, unspecified: Secondary | ICD-10-CM | POA: Diagnosis not present

## 2022-05-01 DIAGNOSIS — D631 Anemia in chronic kidney disease: Secondary | ICD-10-CM | POA: Diagnosis not present

## 2022-05-01 DIAGNOSIS — D509 Iron deficiency anemia, unspecified: Secondary | ICD-10-CM | POA: Diagnosis not present

## 2022-05-01 DIAGNOSIS — Z992 Dependence on renal dialysis: Secondary | ICD-10-CM | POA: Diagnosis not present

## 2022-05-01 DIAGNOSIS — N2581 Secondary hyperparathyroidism of renal origin: Secondary | ICD-10-CM | POA: Diagnosis not present

## 2022-05-01 DIAGNOSIS — N186 End stage renal disease: Secondary | ICD-10-CM | POA: Diagnosis not present

## 2022-05-01 DIAGNOSIS — N250 Renal osteodystrophy: Secondary | ICD-10-CM | POA: Diagnosis not present

## 2022-05-02 DIAGNOSIS — I872 Venous insufficiency (chronic) (peripheral): Secondary | ICD-10-CM | POA: Diagnosis not present

## 2022-05-02 DIAGNOSIS — Z6841 Body Mass Index (BMI) 40.0 and over, adult: Secondary | ICD-10-CM | POA: Diagnosis not present

## 2022-05-03 DIAGNOSIS — D509 Iron deficiency anemia, unspecified: Secondary | ICD-10-CM | POA: Diagnosis not present

## 2022-05-03 DIAGNOSIS — N2581 Secondary hyperparathyroidism of renal origin: Secondary | ICD-10-CM | POA: Diagnosis not present

## 2022-05-03 DIAGNOSIS — N250 Renal osteodystrophy: Secondary | ICD-10-CM | POA: Diagnosis not present

## 2022-05-03 DIAGNOSIS — N186 End stage renal disease: Secondary | ICD-10-CM | POA: Diagnosis not present

## 2022-05-03 DIAGNOSIS — Z992 Dependence on renal dialysis: Secondary | ICD-10-CM | POA: Diagnosis not present

## 2022-05-03 DIAGNOSIS — D631 Anemia in chronic kidney disease: Secondary | ICD-10-CM | POA: Diagnosis not present

## 2022-05-06 DIAGNOSIS — N2581 Secondary hyperparathyroidism of renal origin: Secondary | ICD-10-CM | POA: Diagnosis not present

## 2022-05-06 DIAGNOSIS — N250 Renal osteodystrophy: Secondary | ICD-10-CM | POA: Diagnosis not present

## 2022-05-06 DIAGNOSIS — D509 Iron deficiency anemia, unspecified: Secondary | ICD-10-CM | POA: Diagnosis not present

## 2022-05-06 DIAGNOSIS — D631 Anemia in chronic kidney disease: Secondary | ICD-10-CM | POA: Diagnosis not present

## 2022-05-06 DIAGNOSIS — N186 End stage renal disease: Secondary | ICD-10-CM | POA: Diagnosis not present

## 2022-05-06 DIAGNOSIS — Z992 Dependence on renal dialysis: Secondary | ICD-10-CM | POA: Diagnosis not present

## 2022-05-08 DIAGNOSIS — Z992 Dependence on renal dialysis: Secondary | ICD-10-CM | POA: Diagnosis not present

## 2022-05-08 DIAGNOSIS — N250 Renal osteodystrophy: Secondary | ICD-10-CM | POA: Diagnosis not present

## 2022-05-08 DIAGNOSIS — D631 Anemia in chronic kidney disease: Secondary | ICD-10-CM | POA: Diagnosis not present

## 2022-05-08 DIAGNOSIS — D509 Iron deficiency anemia, unspecified: Secondary | ICD-10-CM | POA: Diagnosis not present

## 2022-05-08 DIAGNOSIS — N2581 Secondary hyperparathyroidism of renal origin: Secondary | ICD-10-CM | POA: Diagnosis not present

## 2022-05-08 DIAGNOSIS — N186 End stage renal disease: Secondary | ICD-10-CM | POA: Diagnosis not present

## 2022-05-10 DIAGNOSIS — D509 Iron deficiency anemia, unspecified: Secondary | ICD-10-CM | POA: Diagnosis not present

## 2022-05-10 DIAGNOSIS — N186 End stage renal disease: Secondary | ICD-10-CM | POA: Diagnosis not present

## 2022-05-10 DIAGNOSIS — N2581 Secondary hyperparathyroidism of renal origin: Secondary | ICD-10-CM | POA: Diagnosis not present

## 2022-05-10 DIAGNOSIS — D631 Anemia in chronic kidney disease: Secondary | ICD-10-CM | POA: Diagnosis not present

## 2022-05-10 DIAGNOSIS — Z992 Dependence on renal dialysis: Secondary | ICD-10-CM | POA: Diagnosis not present

## 2022-05-10 DIAGNOSIS — N250 Renal osteodystrophy: Secondary | ICD-10-CM | POA: Diagnosis not present

## 2022-05-13 DIAGNOSIS — Z992 Dependence on renal dialysis: Secondary | ICD-10-CM | POA: Diagnosis not present

## 2022-05-13 DIAGNOSIS — N250 Renal osteodystrophy: Secondary | ICD-10-CM | POA: Diagnosis not present

## 2022-05-13 DIAGNOSIS — N186 End stage renal disease: Secondary | ICD-10-CM | POA: Diagnosis not present

## 2022-05-13 DIAGNOSIS — D631 Anemia in chronic kidney disease: Secondary | ICD-10-CM | POA: Diagnosis not present

## 2022-05-13 DIAGNOSIS — D509 Iron deficiency anemia, unspecified: Secondary | ICD-10-CM | POA: Diagnosis not present

## 2022-05-13 DIAGNOSIS — N2581 Secondary hyperparathyroidism of renal origin: Secondary | ICD-10-CM | POA: Diagnosis not present

## 2022-05-15 DIAGNOSIS — N186 End stage renal disease: Secondary | ICD-10-CM | POA: Diagnosis not present

## 2022-05-15 DIAGNOSIS — Z992 Dependence on renal dialysis: Secondary | ICD-10-CM | POA: Diagnosis not present

## 2022-05-15 DIAGNOSIS — D509 Iron deficiency anemia, unspecified: Secondary | ICD-10-CM | POA: Diagnosis not present

## 2022-05-15 DIAGNOSIS — N250 Renal osteodystrophy: Secondary | ICD-10-CM | POA: Diagnosis not present

## 2022-05-15 DIAGNOSIS — D631 Anemia in chronic kidney disease: Secondary | ICD-10-CM | POA: Diagnosis not present

## 2022-05-15 DIAGNOSIS — N2581 Secondary hyperparathyroidism of renal origin: Secondary | ICD-10-CM | POA: Diagnosis not present

## 2022-05-17 DIAGNOSIS — N186 End stage renal disease: Secondary | ICD-10-CM | POA: Diagnosis not present

## 2022-05-17 DIAGNOSIS — D631 Anemia in chronic kidney disease: Secondary | ICD-10-CM | POA: Diagnosis not present

## 2022-05-17 DIAGNOSIS — D509 Iron deficiency anemia, unspecified: Secondary | ICD-10-CM | POA: Diagnosis not present

## 2022-05-17 DIAGNOSIS — Z992 Dependence on renal dialysis: Secondary | ICD-10-CM | POA: Diagnosis not present

## 2022-05-17 DIAGNOSIS — N2581 Secondary hyperparathyroidism of renal origin: Secondary | ICD-10-CM | POA: Diagnosis not present

## 2022-05-17 DIAGNOSIS — N250 Renal osteodystrophy: Secondary | ICD-10-CM | POA: Diagnosis not present

## 2022-05-19 DIAGNOSIS — N2581 Secondary hyperparathyroidism of renal origin: Secondary | ICD-10-CM | POA: Diagnosis not present

## 2022-05-19 DIAGNOSIS — N250 Renal osteodystrophy: Secondary | ICD-10-CM | POA: Diagnosis not present

## 2022-05-19 DIAGNOSIS — D631 Anemia in chronic kidney disease: Secondary | ICD-10-CM | POA: Diagnosis not present

## 2022-05-19 DIAGNOSIS — D509 Iron deficiency anemia, unspecified: Secondary | ICD-10-CM | POA: Diagnosis not present

## 2022-05-19 DIAGNOSIS — N186 End stage renal disease: Secondary | ICD-10-CM | POA: Diagnosis not present

## 2022-05-19 DIAGNOSIS — Z992 Dependence on renal dialysis: Secondary | ICD-10-CM | POA: Diagnosis not present

## 2022-05-21 DIAGNOSIS — D509 Iron deficiency anemia, unspecified: Secondary | ICD-10-CM | POA: Diagnosis not present

## 2022-05-21 DIAGNOSIS — N186 End stage renal disease: Secondary | ICD-10-CM | POA: Diagnosis not present

## 2022-05-21 DIAGNOSIS — N250 Renal osteodystrophy: Secondary | ICD-10-CM | POA: Diagnosis not present

## 2022-05-21 DIAGNOSIS — D631 Anemia in chronic kidney disease: Secondary | ICD-10-CM | POA: Diagnosis not present

## 2022-05-21 DIAGNOSIS — N2581 Secondary hyperparathyroidism of renal origin: Secondary | ICD-10-CM | POA: Diagnosis not present

## 2022-05-21 DIAGNOSIS — Z992 Dependence on renal dialysis: Secondary | ICD-10-CM | POA: Diagnosis not present

## 2022-05-24 DIAGNOSIS — N250 Renal osteodystrophy: Secondary | ICD-10-CM | POA: Diagnosis not present

## 2022-05-24 DIAGNOSIS — N186 End stage renal disease: Secondary | ICD-10-CM | POA: Diagnosis not present

## 2022-05-24 DIAGNOSIS — N2581 Secondary hyperparathyroidism of renal origin: Secondary | ICD-10-CM | POA: Diagnosis not present

## 2022-05-24 DIAGNOSIS — D631 Anemia in chronic kidney disease: Secondary | ICD-10-CM | POA: Diagnosis not present

## 2022-05-24 DIAGNOSIS — Z992 Dependence on renal dialysis: Secondary | ICD-10-CM | POA: Diagnosis not present

## 2022-05-24 DIAGNOSIS — D509 Iron deficiency anemia, unspecified: Secondary | ICD-10-CM | POA: Diagnosis not present

## 2022-05-27 DIAGNOSIS — N186 End stage renal disease: Secondary | ICD-10-CM | POA: Diagnosis not present

## 2022-05-27 DIAGNOSIS — Z992 Dependence on renal dialysis: Secondary | ICD-10-CM | POA: Diagnosis not present

## 2022-05-27 DIAGNOSIS — D631 Anemia in chronic kidney disease: Secondary | ICD-10-CM | POA: Diagnosis not present

## 2022-05-27 DIAGNOSIS — N250 Renal osteodystrophy: Secondary | ICD-10-CM | POA: Diagnosis not present

## 2022-05-27 DIAGNOSIS — D509 Iron deficiency anemia, unspecified: Secondary | ICD-10-CM | POA: Diagnosis not present

## 2022-05-27 DIAGNOSIS — N2581 Secondary hyperparathyroidism of renal origin: Secondary | ICD-10-CM | POA: Diagnosis not present

## 2022-05-29 DIAGNOSIS — N250 Renal osteodystrophy: Secondary | ICD-10-CM | POA: Diagnosis not present

## 2022-05-29 DIAGNOSIS — D631 Anemia in chronic kidney disease: Secondary | ICD-10-CM | POA: Diagnosis not present

## 2022-05-29 DIAGNOSIS — Z992 Dependence on renal dialysis: Secondary | ICD-10-CM | POA: Diagnosis not present

## 2022-05-29 DIAGNOSIS — D509 Iron deficiency anemia, unspecified: Secondary | ICD-10-CM | POA: Diagnosis not present

## 2022-05-29 DIAGNOSIS — N186 End stage renal disease: Secondary | ICD-10-CM | POA: Diagnosis not present

## 2022-05-29 DIAGNOSIS — N2581 Secondary hyperparathyroidism of renal origin: Secondary | ICD-10-CM | POA: Diagnosis not present

## 2022-05-31 DIAGNOSIS — N186 End stage renal disease: Secondary | ICD-10-CM | POA: Diagnosis not present

## 2022-05-31 DIAGNOSIS — N250 Renal osteodystrophy: Secondary | ICD-10-CM | POA: Diagnosis not present

## 2022-05-31 DIAGNOSIS — D631 Anemia in chronic kidney disease: Secondary | ICD-10-CM | POA: Diagnosis not present

## 2022-05-31 DIAGNOSIS — Z992 Dependence on renal dialysis: Secondary | ICD-10-CM | POA: Diagnosis not present

## 2022-05-31 DIAGNOSIS — D509 Iron deficiency anemia, unspecified: Secondary | ICD-10-CM | POA: Diagnosis not present

## 2022-05-31 DIAGNOSIS — N2581 Secondary hyperparathyroidism of renal origin: Secondary | ICD-10-CM | POA: Diagnosis not present

## 2022-06-03 DIAGNOSIS — Z992 Dependence on renal dialysis: Secondary | ICD-10-CM | POA: Diagnosis not present

## 2022-06-03 DIAGNOSIS — D631 Anemia in chronic kidney disease: Secondary | ICD-10-CM | POA: Diagnosis not present

## 2022-06-03 DIAGNOSIS — N250 Renal osteodystrophy: Secondary | ICD-10-CM | POA: Diagnosis not present

## 2022-06-03 DIAGNOSIS — N2581 Secondary hyperparathyroidism of renal origin: Secondary | ICD-10-CM | POA: Diagnosis not present

## 2022-06-03 DIAGNOSIS — N186 End stage renal disease: Secondary | ICD-10-CM | POA: Diagnosis not present

## 2022-06-03 DIAGNOSIS — D509 Iron deficiency anemia, unspecified: Secondary | ICD-10-CM | POA: Diagnosis not present

## 2022-06-05 DIAGNOSIS — Z992 Dependence on renal dialysis: Secondary | ICD-10-CM | POA: Diagnosis not present

## 2022-06-05 DIAGNOSIS — D509 Iron deficiency anemia, unspecified: Secondary | ICD-10-CM | POA: Diagnosis not present

## 2022-06-05 DIAGNOSIS — N250 Renal osteodystrophy: Secondary | ICD-10-CM | POA: Diagnosis not present

## 2022-06-05 DIAGNOSIS — N2581 Secondary hyperparathyroidism of renal origin: Secondary | ICD-10-CM | POA: Diagnosis not present

## 2022-06-05 DIAGNOSIS — N186 End stage renal disease: Secondary | ICD-10-CM | POA: Diagnosis not present

## 2022-06-05 DIAGNOSIS — D631 Anemia in chronic kidney disease: Secondary | ICD-10-CM | POA: Diagnosis not present

## 2022-06-07 DIAGNOSIS — Z992 Dependence on renal dialysis: Secondary | ICD-10-CM | POA: Diagnosis not present

## 2022-06-07 DIAGNOSIS — N186 End stage renal disease: Secondary | ICD-10-CM | POA: Diagnosis not present

## 2022-06-07 DIAGNOSIS — D631 Anemia in chronic kidney disease: Secondary | ICD-10-CM | POA: Diagnosis not present

## 2022-06-07 DIAGNOSIS — D509 Iron deficiency anemia, unspecified: Secondary | ICD-10-CM | POA: Diagnosis not present

## 2022-06-07 DIAGNOSIS — N250 Renal osteodystrophy: Secondary | ICD-10-CM | POA: Diagnosis not present

## 2022-06-07 DIAGNOSIS — N2581 Secondary hyperparathyroidism of renal origin: Secondary | ICD-10-CM | POA: Diagnosis not present

## 2022-06-10 DIAGNOSIS — D509 Iron deficiency anemia, unspecified: Secondary | ICD-10-CM | POA: Diagnosis not present

## 2022-06-10 DIAGNOSIS — D631 Anemia in chronic kidney disease: Secondary | ICD-10-CM | POA: Diagnosis not present

## 2022-06-10 DIAGNOSIS — N2581 Secondary hyperparathyroidism of renal origin: Secondary | ICD-10-CM | POA: Diagnosis not present

## 2022-06-10 DIAGNOSIS — N186 End stage renal disease: Secondary | ICD-10-CM | POA: Diagnosis not present

## 2022-06-10 DIAGNOSIS — N250 Renal osteodystrophy: Secondary | ICD-10-CM | POA: Diagnosis not present

## 2022-06-10 DIAGNOSIS — Z992 Dependence on renal dialysis: Secondary | ICD-10-CM | POA: Diagnosis not present

## 2022-06-12 DIAGNOSIS — N250 Renal osteodystrophy: Secondary | ICD-10-CM | POA: Diagnosis not present

## 2022-06-12 DIAGNOSIS — D631 Anemia in chronic kidney disease: Secondary | ICD-10-CM | POA: Diagnosis not present

## 2022-06-12 DIAGNOSIS — D509 Iron deficiency anemia, unspecified: Secondary | ICD-10-CM | POA: Diagnosis not present

## 2022-06-12 DIAGNOSIS — N186 End stage renal disease: Secondary | ICD-10-CM | POA: Diagnosis not present

## 2022-06-12 DIAGNOSIS — N2581 Secondary hyperparathyroidism of renal origin: Secondary | ICD-10-CM | POA: Diagnosis not present

## 2022-06-12 DIAGNOSIS — Z992 Dependence on renal dialysis: Secondary | ICD-10-CM | POA: Diagnosis not present

## 2022-06-14 DIAGNOSIS — D509 Iron deficiency anemia, unspecified: Secondary | ICD-10-CM | POA: Diagnosis not present

## 2022-06-14 DIAGNOSIS — N250 Renal osteodystrophy: Secondary | ICD-10-CM | POA: Diagnosis not present

## 2022-06-14 DIAGNOSIS — N2581 Secondary hyperparathyroidism of renal origin: Secondary | ICD-10-CM | POA: Diagnosis not present

## 2022-06-14 DIAGNOSIS — N186 End stage renal disease: Secondary | ICD-10-CM | POA: Diagnosis not present

## 2022-06-14 DIAGNOSIS — Z992 Dependence on renal dialysis: Secondary | ICD-10-CM | POA: Diagnosis not present

## 2022-06-14 DIAGNOSIS — D631 Anemia in chronic kidney disease: Secondary | ICD-10-CM | POA: Diagnosis not present

## 2022-06-16 DIAGNOSIS — R2241 Localized swelling, mass and lump, right lower limb: Secondary | ICD-10-CM | POA: Diagnosis not present

## 2022-06-16 DIAGNOSIS — M79604 Pain in right leg: Secondary | ICD-10-CM | POA: Diagnosis not present

## 2022-06-17 DIAGNOSIS — N250 Renal osteodystrophy: Secondary | ICD-10-CM | POA: Diagnosis not present

## 2022-06-17 DIAGNOSIS — N186 End stage renal disease: Secondary | ICD-10-CM | POA: Diagnosis not present

## 2022-06-17 DIAGNOSIS — D631 Anemia in chronic kidney disease: Secondary | ICD-10-CM | POA: Diagnosis not present

## 2022-06-17 DIAGNOSIS — Z992 Dependence on renal dialysis: Secondary | ICD-10-CM | POA: Diagnosis not present

## 2022-06-17 DIAGNOSIS — N2581 Secondary hyperparathyroidism of renal origin: Secondary | ICD-10-CM | POA: Diagnosis not present

## 2022-06-17 DIAGNOSIS — D509 Iron deficiency anemia, unspecified: Secondary | ICD-10-CM | POA: Diagnosis not present

## 2022-06-19 DIAGNOSIS — N250 Renal osteodystrophy: Secondary | ICD-10-CM | POA: Diagnosis not present

## 2022-06-19 DIAGNOSIS — D509 Iron deficiency anemia, unspecified: Secondary | ICD-10-CM | POA: Diagnosis not present

## 2022-06-19 DIAGNOSIS — N2581 Secondary hyperparathyroidism of renal origin: Secondary | ICD-10-CM | POA: Diagnosis not present

## 2022-06-19 DIAGNOSIS — N186 End stage renal disease: Secondary | ICD-10-CM | POA: Diagnosis not present

## 2022-06-19 DIAGNOSIS — Z992 Dependence on renal dialysis: Secondary | ICD-10-CM | POA: Diagnosis not present

## 2022-06-19 DIAGNOSIS — D631 Anemia in chronic kidney disease: Secondary | ICD-10-CM | POA: Diagnosis not present

## 2022-06-21 DIAGNOSIS — N2581 Secondary hyperparathyroidism of renal origin: Secondary | ICD-10-CM | POA: Diagnosis not present

## 2022-06-21 DIAGNOSIS — N186 End stage renal disease: Secondary | ICD-10-CM | POA: Diagnosis not present

## 2022-06-21 DIAGNOSIS — D509 Iron deficiency anemia, unspecified: Secondary | ICD-10-CM | POA: Diagnosis not present

## 2022-06-21 DIAGNOSIS — N250 Renal osteodystrophy: Secondary | ICD-10-CM | POA: Diagnosis not present

## 2022-06-21 DIAGNOSIS — Z992 Dependence on renal dialysis: Secondary | ICD-10-CM | POA: Diagnosis not present

## 2022-06-21 DIAGNOSIS — D631 Anemia in chronic kidney disease: Secondary | ICD-10-CM | POA: Diagnosis not present

## 2022-06-24 DIAGNOSIS — N250 Renal osteodystrophy: Secondary | ICD-10-CM | POA: Diagnosis not present

## 2022-06-24 DIAGNOSIS — N186 End stage renal disease: Secondary | ICD-10-CM | POA: Diagnosis not present

## 2022-06-24 DIAGNOSIS — Z992 Dependence on renal dialysis: Secondary | ICD-10-CM | POA: Diagnosis not present

## 2022-06-24 DIAGNOSIS — D631 Anemia in chronic kidney disease: Secondary | ICD-10-CM | POA: Diagnosis not present

## 2022-06-24 DIAGNOSIS — D509 Iron deficiency anemia, unspecified: Secondary | ICD-10-CM | POA: Diagnosis not present

## 2022-06-24 DIAGNOSIS — N2581 Secondary hyperparathyroidism of renal origin: Secondary | ICD-10-CM | POA: Diagnosis not present

## 2022-06-26 DIAGNOSIS — M25562 Pain in left knee: Secondary | ICD-10-CM | POA: Diagnosis not present

## 2022-06-26 DIAGNOSIS — G8929 Other chronic pain: Secondary | ICD-10-CM | POA: Diagnosis not present

## 2022-06-26 DIAGNOSIS — M869 Osteomyelitis, unspecified: Secondary | ICD-10-CM | POA: Diagnosis not present

## 2022-06-26 DIAGNOSIS — N250 Renal osteodystrophy: Secondary | ICD-10-CM | POA: Diagnosis not present

## 2022-06-26 DIAGNOSIS — N261 Atrophy of kidney (terminal): Secondary | ICD-10-CM | POA: Diagnosis not present

## 2022-06-27 DIAGNOSIS — N186 End stage renal disease: Secondary | ICD-10-CM | POA: Diagnosis not present

## 2022-06-27 DIAGNOSIS — N2581 Secondary hyperparathyroidism of renal origin: Secondary | ICD-10-CM | POA: Diagnosis not present

## 2022-06-27 DIAGNOSIS — D509 Iron deficiency anemia, unspecified: Secondary | ICD-10-CM | POA: Diagnosis not present

## 2022-06-27 DIAGNOSIS — D631 Anemia in chronic kidney disease: Secondary | ICD-10-CM | POA: Diagnosis not present

## 2022-06-27 DIAGNOSIS — N250 Renal osteodystrophy: Secondary | ICD-10-CM | POA: Diagnosis not present

## 2022-06-27 DIAGNOSIS — Z992 Dependence on renal dialysis: Secondary | ICD-10-CM | POA: Diagnosis not present

## 2022-06-28 DIAGNOSIS — Z992 Dependence on renal dialysis: Secondary | ICD-10-CM | POA: Diagnosis not present

## 2022-06-28 DIAGNOSIS — D631 Anemia in chronic kidney disease: Secondary | ICD-10-CM | POA: Diagnosis not present

## 2022-06-28 DIAGNOSIS — N186 End stage renal disease: Secondary | ICD-10-CM | POA: Diagnosis not present

## 2022-06-28 DIAGNOSIS — D509 Iron deficiency anemia, unspecified: Secondary | ICD-10-CM | POA: Diagnosis not present

## 2022-06-28 DIAGNOSIS — N2581 Secondary hyperparathyroidism of renal origin: Secondary | ICD-10-CM | POA: Diagnosis not present

## 2022-06-28 DIAGNOSIS — N250 Renal osteodystrophy: Secondary | ICD-10-CM | POA: Diagnosis not present

## 2022-06-29 DIAGNOSIS — Z992 Dependence on renal dialysis: Secondary | ICD-10-CM | POA: Diagnosis not present

## 2022-06-29 DIAGNOSIS — N186 End stage renal disease: Secondary | ICD-10-CM | POA: Diagnosis not present

## 2022-07-01 DIAGNOSIS — D631 Anemia in chronic kidney disease: Secondary | ICD-10-CM | POA: Diagnosis not present

## 2022-07-01 DIAGNOSIS — N186 End stage renal disease: Secondary | ICD-10-CM | POA: Diagnosis not present

## 2022-07-01 DIAGNOSIS — N2581 Secondary hyperparathyroidism of renal origin: Secondary | ICD-10-CM | POA: Diagnosis not present

## 2022-07-01 DIAGNOSIS — D509 Iron deficiency anemia, unspecified: Secondary | ICD-10-CM | POA: Diagnosis not present

## 2022-07-01 DIAGNOSIS — Z992 Dependence on renal dialysis: Secondary | ICD-10-CM | POA: Diagnosis not present

## 2022-07-01 DIAGNOSIS — N250 Renal osteodystrophy: Secondary | ICD-10-CM | POA: Diagnosis not present

## 2022-07-03 DIAGNOSIS — N2581 Secondary hyperparathyroidism of renal origin: Secondary | ICD-10-CM | POA: Diagnosis not present

## 2022-07-03 DIAGNOSIS — Z992 Dependence on renal dialysis: Secondary | ICD-10-CM | POA: Diagnosis not present

## 2022-07-03 DIAGNOSIS — D631 Anemia in chronic kidney disease: Secondary | ICD-10-CM | POA: Diagnosis not present

## 2022-07-03 DIAGNOSIS — N186 End stage renal disease: Secondary | ICD-10-CM | POA: Diagnosis not present

## 2022-07-03 DIAGNOSIS — N250 Renal osteodystrophy: Secondary | ICD-10-CM | POA: Diagnosis not present

## 2022-07-03 DIAGNOSIS — D509 Iron deficiency anemia, unspecified: Secondary | ICD-10-CM | POA: Diagnosis not present

## 2022-07-05 DIAGNOSIS — N186 End stage renal disease: Secondary | ICD-10-CM | POA: Diagnosis not present

## 2022-07-05 DIAGNOSIS — N250 Renal osteodystrophy: Secondary | ICD-10-CM | POA: Diagnosis not present

## 2022-07-05 DIAGNOSIS — D509 Iron deficiency anemia, unspecified: Secondary | ICD-10-CM | POA: Diagnosis not present

## 2022-07-05 DIAGNOSIS — D631 Anemia in chronic kidney disease: Secondary | ICD-10-CM | POA: Diagnosis not present

## 2022-07-05 DIAGNOSIS — Z992 Dependence on renal dialysis: Secondary | ICD-10-CM | POA: Diagnosis not present

## 2022-07-05 DIAGNOSIS — N2581 Secondary hyperparathyroidism of renal origin: Secondary | ICD-10-CM | POA: Diagnosis not present

## 2022-07-08 DIAGNOSIS — D631 Anemia in chronic kidney disease: Secondary | ICD-10-CM | POA: Diagnosis not present

## 2022-07-08 DIAGNOSIS — Z992 Dependence on renal dialysis: Secondary | ICD-10-CM | POA: Diagnosis not present

## 2022-07-08 DIAGNOSIS — N186 End stage renal disease: Secondary | ICD-10-CM | POA: Diagnosis not present

## 2022-07-08 DIAGNOSIS — N2581 Secondary hyperparathyroidism of renal origin: Secondary | ICD-10-CM | POA: Diagnosis not present

## 2022-07-08 DIAGNOSIS — D509 Iron deficiency anemia, unspecified: Secondary | ICD-10-CM | POA: Diagnosis not present

## 2022-07-08 DIAGNOSIS — N250 Renal osteodystrophy: Secondary | ICD-10-CM | POA: Diagnosis not present

## 2022-07-10 DIAGNOSIS — D631 Anemia in chronic kidney disease: Secondary | ICD-10-CM | POA: Diagnosis not present

## 2022-07-10 DIAGNOSIS — Z992 Dependence on renal dialysis: Secondary | ICD-10-CM | POA: Diagnosis not present

## 2022-07-10 DIAGNOSIS — D509 Iron deficiency anemia, unspecified: Secondary | ICD-10-CM | POA: Diagnosis not present

## 2022-07-10 DIAGNOSIS — N2581 Secondary hyperparathyroidism of renal origin: Secondary | ICD-10-CM | POA: Diagnosis not present

## 2022-07-10 DIAGNOSIS — N250 Renal osteodystrophy: Secondary | ICD-10-CM | POA: Diagnosis not present

## 2022-07-10 DIAGNOSIS — N186 End stage renal disease: Secondary | ICD-10-CM | POA: Diagnosis not present

## 2022-07-12 DIAGNOSIS — N2581 Secondary hyperparathyroidism of renal origin: Secondary | ICD-10-CM | POA: Diagnosis not present

## 2022-07-12 DIAGNOSIS — N250 Renal osteodystrophy: Secondary | ICD-10-CM | POA: Diagnosis not present

## 2022-07-12 DIAGNOSIS — N186 End stage renal disease: Secondary | ICD-10-CM | POA: Diagnosis not present

## 2022-07-12 DIAGNOSIS — D631 Anemia in chronic kidney disease: Secondary | ICD-10-CM | POA: Diagnosis not present

## 2022-07-12 DIAGNOSIS — Z992 Dependence on renal dialysis: Secondary | ICD-10-CM | POA: Diagnosis not present

## 2022-07-12 DIAGNOSIS — D509 Iron deficiency anemia, unspecified: Secondary | ICD-10-CM | POA: Diagnosis not present

## 2022-07-15 DIAGNOSIS — D509 Iron deficiency anemia, unspecified: Secondary | ICD-10-CM | POA: Diagnosis not present

## 2022-07-15 DIAGNOSIS — N186 End stage renal disease: Secondary | ICD-10-CM | POA: Diagnosis not present

## 2022-07-15 DIAGNOSIS — N2581 Secondary hyperparathyroidism of renal origin: Secondary | ICD-10-CM | POA: Diagnosis not present

## 2022-07-15 DIAGNOSIS — N250 Renal osteodystrophy: Secondary | ICD-10-CM | POA: Diagnosis not present

## 2022-07-15 DIAGNOSIS — Z992 Dependence on renal dialysis: Secondary | ICD-10-CM | POA: Diagnosis not present

## 2022-07-15 DIAGNOSIS — D631 Anemia in chronic kidney disease: Secondary | ICD-10-CM | POA: Diagnosis not present

## 2022-07-17 DIAGNOSIS — N250 Renal osteodystrophy: Secondary | ICD-10-CM | POA: Diagnosis not present

## 2022-07-17 DIAGNOSIS — N186 End stage renal disease: Secondary | ICD-10-CM | POA: Diagnosis not present

## 2022-07-17 DIAGNOSIS — D509 Iron deficiency anemia, unspecified: Secondary | ICD-10-CM | POA: Diagnosis not present

## 2022-07-17 DIAGNOSIS — N2581 Secondary hyperparathyroidism of renal origin: Secondary | ICD-10-CM | POA: Diagnosis not present

## 2022-07-17 DIAGNOSIS — D631 Anemia in chronic kidney disease: Secondary | ICD-10-CM | POA: Diagnosis not present

## 2022-07-17 DIAGNOSIS — Z992 Dependence on renal dialysis: Secondary | ICD-10-CM | POA: Diagnosis not present

## 2022-07-19 DIAGNOSIS — D631 Anemia in chronic kidney disease: Secondary | ICD-10-CM | POA: Diagnosis not present

## 2022-07-19 DIAGNOSIS — N2581 Secondary hyperparathyroidism of renal origin: Secondary | ICD-10-CM | POA: Diagnosis not present

## 2022-07-19 DIAGNOSIS — N250 Renal osteodystrophy: Secondary | ICD-10-CM | POA: Diagnosis not present

## 2022-07-19 DIAGNOSIS — Z992 Dependence on renal dialysis: Secondary | ICD-10-CM | POA: Diagnosis not present

## 2022-07-19 DIAGNOSIS — D509 Iron deficiency anemia, unspecified: Secondary | ICD-10-CM | POA: Diagnosis not present

## 2022-07-19 DIAGNOSIS — N186 End stage renal disease: Secondary | ICD-10-CM | POA: Diagnosis not present

## 2022-07-22 DIAGNOSIS — D509 Iron deficiency anemia, unspecified: Secondary | ICD-10-CM | POA: Diagnosis not present

## 2022-07-22 DIAGNOSIS — N250 Renal osteodystrophy: Secondary | ICD-10-CM | POA: Diagnosis not present

## 2022-07-22 DIAGNOSIS — N2581 Secondary hyperparathyroidism of renal origin: Secondary | ICD-10-CM | POA: Diagnosis not present

## 2022-07-22 DIAGNOSIS — D631 Anemia in chronic kidney disease: Secondary | ICD-10-CM | POA: Diagnosis not present

## 2022-07-22 DIAGNOSIS — N186 End stage renal disease: Secondary | ICD-10-CM | POA: Diagnosis not present

## 2022-07-22 DIAGNOSIS — Z992 Dependence on renal dialysis: Secondary | ICD-10-CM | POA: Diagnosis not present

## 2022-07-24 DIAGNOSIS — Z992 Dependence on renal dialysis: Secondary | ICD-10-CM | POA: Diagnosis not present

## 2022-07-24 DIAGNOSIS — N186 End stage renal disease: Secondary | ICD-10-CM | POA: Diagnosis not present

## 2022-07-24 DIAGNOSIS — N250 Renal osteodystrophy: Secondary | ICD-10-CM | POA: Diagnosis not present

## 2022-07-24 DIAGNOSIS — D631 Anemia in chronic kidney disease: Secondary | ICD-10-CM | POA: Diagnosis not present

## 2022-07-24 DIAGNOSIS — D509 Iron deficiency anemia, unspecified: Secondary | ICD-10-CM | POA: Diagnosis not present

## 2022-07-24 DIAGNOSIS — N2581 Secondary hyperparathyroidism of renal origin: Secondary | ICD-10-CM | POA: Diagnosis not present

## 2022-07-26 DIAGNOSIS — D509 Iron deficiency anemia, unspecified: Secondary | ICD-10-CM | POA: Diagnosis not present

## 2022-07-26 DIAGNOSIS — D631 Anemia in chronic kidney disease: Secondary | ICD-10-CM | POA: Diagnosis not present

## 2022-07-26 DIAGNOSIS — N250 Renal osteodystrophy: Secondary | ICD-10-CM | POA: Diagnosis not present

## 2022-07-26 DIAGNOSIS — N2581 Secondary hyperparathyroidism of renal origin: Secondary | ICD-10-CM | POA: Diagnosis not present

## 2022-07-26 DIAGNOSIS — N186 End stage renal disease: Secondary | ICD-10-CM | POA: Diagnosis not present

## 2022-07-26 DIAGNOSIS — Z992 Dependence on renal dialysis: Secondary | ICD-10-CM | POA: Diagnosis not present

## 2022-07-30 DIAGNOSIS — M5410 Radiculopathy, site unspecified: Secondary | ICD-10-CM | POA: Diagnosis not present

## 2022-07-30 DIAGNOSIS — Z992 Dependence on renal dialysis: Secondary | ICD-10-CM | POA: Diagnosis not present

## 2022-07-30 DIAGNOSIS — Z6841 Body Mass Index (BMI) 40.0 and over, adult: Secondary | ICD-10-CM | POA: Diagnosis not present

## 2022-07-30 DIAGNOSIS — N186 End stage renal disease: Secondary | ICD-10-CM | POA: Diagnosis not present

## 2022-07-31 DIAGNOSIS — N186 End stage renal disease: Secondary | ICD-10-CM | POA: Diagnosis not present

## 2022-07-31 DIAGNOSIS — N250 Renal osteodystrophy: Secondary | ICD-10-CM | POA: Diagnosis not present

## 2022-07-31 DIAGNOSIS — N2581 Secondary hyperparathyroidism of renal origin: Secondary | ICD-10-CM | POA: Diagnosis not present

## 2022-07-31 DIAGNOSIS — Z992 Dependence on renal dialysis: Secondary | ICD-10-CM | POA: Diagnosis not present

## 2022-07-31 DIAGNOSIS — D509 Iron deficiency anemia, unspecified: Secondary | ICD-10-CM | POA: Diagnosis not present

## 2022-07-31 DIAGNOSIS — D631 Anemia in chronic kidney disease: Secondary | ICD-10-CM | POA: Diagnosis not present

## 2022-08-02 DIAGNOSIS — D631 Anemia in chronic kidney disease: Secondary | ICD-10-CM | POA: Diagnosis not present

## 2022-08-02 DIAGNOSIS — N2581 Secondary hyperparathyroidism of renal origin: Secondary | ICD-10-CM | POA: Diagnosis not present

## 2022-08-02 DIAGNOSIS — Z992 Dependence on renal dialysis: Secondary | ICD-10-CM | POA: Diagnosis not present

## 2022-08-02 DIAGNOSIS — N250 Renal osteodystrophy: Secondary | ICD-10-CM | POA: Diagnosis not present

## 2022-08-02 DIAGNOSIS — D509 Iron deficiency anemia, unspecified: Secondary | ICD-10-CM | POA: Diagnosis not present

## 2022-08-02 DIAGNOSIS — N186 End stage renal disease: Secondary | ICD-10-CM | POA: Diagnosis not present

## 2022-08-04 DIAGNOSIS — M5134 Other intervertebral disc degeneration, thoracic region: Secondary | ICD-10-CM | POA: Diagnosis not present

## 2022-08-04 DIAGNOSIS — M5136 Other intervertebral disc degeneration, lumbar region: Secondary | ICD-10-CM | POA: Diagnosis not present

## 2022-08-04 DIAGNOSIS — R7881 Bacteremia: Secondary | ICD-10-CM | POA: Diagnosis present

## 2022-08-04 DIAGNOSIS — J9811 Atelectasis: Secondary | ICD-10-CM | POA: Diagnosis not present

## 2022-08-04 DIAGNOSIS — E875 Hyperkalemia: Secondary | ICD-10-CM | POA: Diagnosis present

## 2022-08-04 DIAGNOSIS — M549 Dorsalgia, unspecified: Secondary | ICD-10-CM | POA: Diagnosis present

## 2022-08-04 DIAGNOSIS — Z6841 Body Mass Index (BMI) 40.0 and over, adult: Secondary | ICD-10-CM | POA: Diagnosis not present

## 2022-08-04 DIAGNOSIS — M1A9XX0 Chronic gout, unspecified, without tophus (tophi): Secondary | ICD-10-CM | POA: Diagnosis present

## 2022-08-04 DIAGNOSIS — G2581 Restless legs syndrome: Secondary | ICD-10-CM | POA: Diagnosis present

## 2022-08-04 DIAGNOSIS — M545 Low back pain, unspecified: Secondary | ICD-10-CM | POA: Diagnosis not present

## 2022-08-04 DIAGNOSIS — N261 Atrophy of kidney (terminal): Secondary | ICD-10-CM | POA: Diagnosis not present

## 2022-08-04 DIAGNOSIS — M1712 Unilateral primary osteoarthritis, left knee: Secondary | ICD-10-CM | POA: Diagnosis present

## 2022-08-04 DIAGNOSIS — I120 Hypertensive chronic kidney disease with stage 5 chronic kidney disease or end stage renal disease: Secondary | ICD-10-CM | POA: Diagnosis present

## 2022-08-04 DIAGNOSIS — Z79899 Other long term (current) drug therapy: Secondary | ICD-10-CM | POA: Diagnosis not present

## 2022-08-04 DIAGNOSIS — M4322 Fusion of spine, cervical region: Secondary | ICD-10-CM | POA: Diagnosis not present

## 2022-08-04 DIAGNOSIS — Z79891 Long term (current) use of opiate analgesic: Secondary | ICD-10-CM | POA: Diagnosis not present

## 2022-08-04 DIAGNOSIS — Z791 Long term (current) use of non-steroidal anti-inflammatories (NSAID): Secondary | ICD-10-CM | POA: Diagnosis not present

## 2022-08-04 DIAGNOSIS — D631 Anemia in chronic kidney disease: Secondary | ICD-10-CM | POA: Diagnosis present

## 2022-08-04 DIAGNOSIS — I517 Cardiomegaly: Secondary | ICD-10-CM | POA: Diagnosis not present

## 2022-08-04 DIAGNOSIS — I10 Essential (primary) hypertension: Secondary | ICD-10-CM | POA: Diagnosis not present

## 2022-08-04 DIAGNOSIS — M4854XA Collapsed vertebra, not elsewhere classified, thoracic region, initial encounter for fracture: Secondary | ICD-10-CM | POA: Diagnosis not present

## 2022-08-04 DIAGNOSIS — M47812 Spondylosis without myelopathy or radiculopathy, cervical region: Secondary | ICD-10-CM | POA: Diagnosis not present

## 2022-08-04 DIAGNOSIS — N186 End stage renal disease: Secondary | ICD-10-CM | POA: Diagnosis present

## 2022-08-04 DIAGNOSIS — N250 Renal osteodystrophy: Secondary | ICD-10-CM | POA: Diagnosis not present

## 2022-08-04 DIAGNOSIS — K219 Gastro-esophageal reflux disease without esophagitis: Secondary | ICD-10-CM | POA: Diagnosis present

## 2022-08-04 DIAGNOSIS — R200 Anesthesia of skin: Secondary | ICD-10-CM | POA: Diagnosis not present

## 2022-08-04 DIAGNOSIS — Z86711 Personal history of pulmonary embolism: Secondary | ICD-10-CM | POA: Diagnosis not present

## 2022-08-04 DIAGNOSIS — B957 Other staphylococcus as the cause of diseases classified elsewhere: Secondary | ICD-10-CM | POA: Diagnosis present

## 2022-08-04 DIAGNOSIS — Z992 Dependence on renal dialysis: Secondary | ICD-10-CM | POA: Diagnosis not present

## 2022-08-04 DIAGNOSIS — S199XXA Unspecified injury of neck, initial encounter: Secondary | ICD-10-CM | POA: Diagnosis not present

## 2022-08-05 DIAGNOSIS — M4854XA Collapsed vertebra, not elsewhere classified, thoracic region, initial encounter for fracture: Secondary | ICD-10-CM | POA: Diagnosis not present

## 2022-08-05 DIAGNOSIS — N261 Atrophy of kidney (terminal): Secondary | ICD-10-CM | POA: Diagnosis not present

## 2022-08-05 DIAGNOSIS — D631 Anemia in chronic kidney disease: Secondary | ICD-10-CM | POA: Diagnosis present

## 2022-08-05 DIAGNOSIS — G2581 Restless legs syndrome: Secondary | ICD-10-CM | POA: Diagnosis present

## 2022-08-05 DIAGNOSIS — M1A9XX0 Chronic gout, unspecified, without tophus (tophi): Secondary | ICD-10-CM | POA: Diagnosis present

## 2022-08-05 DIAGNOSIS — R7881 Bacteremia: Secondary | ICD-10-CM | POA: Diagnosis present

## 2022-08-05 DIAGNOSIS — Z79891 Long term (current) use of opiate analgesic: Secondary | ICD-10-CM | POA: Diagnosis not present

## 2022-08-05 DIAGNOSIS — M545 Low back pain, unspecified: Secondary | ICD-10-CM | POA: Diagnosis not present

## 2022-08-05 DIAGNOSIS — Z79899 Other long term (current) drug therapy: Secondary | ICD-10-CM | POA: Diagnosis not present

## 2022-08-05 DIAGNOSIS — M47812 Spondylosis without myelopathy or radiculopathy, cervical region: Secondary | ICD-10-CM | POA: Diagnosis not present

## 2022-08-05 DIAGNOSIS — M1712 Unilateral primary osteoarthritis, left knee: Secondary | ICD-10-CM | POA: Diagnosis present

## 2022-08-05 DIAGNOSIS — M4322 Fusion of spine, cervical region: Secondary | ICD-10-CM | POA: Diagnosis not present

## 2022-08-05 DIAGNOSIS — M549 Dorsalgia, unspecified: Secondary | ICD-10-CM | POA: Diagnosis present

## 2022-08-05 DIAGNOSIS — B957 Other staphylococcus as the cause of diseases classified elsewhere: Secondary | ICD-10-CM | POA: Diagnosis present

## 2022-08-05 DIAGNOSIS — R200 Anesthesia of skin: Secondary | ICD-10-CM | POA: Diagnosis not present

## 2022-08-05 DIAGNOSIS — I120 Hypertensive chronic kidney disease with stage 5 chronic kidney disease or end stage renal disease: Secondary | ICD-10-CM | POA: Diagnosis present

## 2022-08-05 DIAGNOSIS — I10 Essential (primary) hypertension: Secondary | ICD-10-CM | POA: Diagnosis not present

## 2022-08-05 DIAGNOSIS — Z6841 Body Mass Index (BMI) 40.0 and over, adult: Secondary | ICD-10-CM | POA: Diagnosis not present

## 2022-08-05 DIAGNOSIS — K219 Gastro-esophageal reflux disease without esophagitis: Secondary | ICD-10-CM | POA: Diagnosis present

## 2022-08-05 DIAGNOSIS — Z86711 Personal history of pulmonary embolism: Secondary | ICD-10-CM | POA: Diagnosis not present

## 2022-08-05 DIAGNOSIS — E875 Hyperkalemia: Secondary | ICD-10-CM | POA: Diagnosis present

## 2022-08-05 DIAGNOSIS — N250 Renal osteodystrophy: Secondary | ICD-10-CM | POA: Diagnosis not present

## 2022-08-05 DIAGNOSIS — N186 End stage renal disease: Secondary | ICD-10-CM | POA: Diagnosis present

## 2022-08-05 DIAGNOSIS — Z791 Long term (current) use of non-steroidal anti-inflammatories (NSAID): Secondary | ICD-10-CM | POA: Diagnosis not present

## 2022-08-05 DIAGNOSIS — S199XXA Unspecified injury of neck, initial encounter: Secondary | ICD-10-CM | POA: Diagnosis not present

## 2022-08-05 DIAGNOSIS — Z992 Dependence on renal dialysis: Secondary | ICD-10-CM | POA: Diagnosis not present

## 2022-08-05 DIAGNOSIS — M5136 Other intervertebral disc degeneration, lumbar region: Secondary | ICD-10-CM | POA: Diagnosis not present

## 2022-08-05 DIAGNOSIS — M5134 Other intervertebral disc degeneration, thoracic region: Secondary | ICD-10-CM | POA: Diagnosis not present

## 2022-08-06 DIAGNOSIS — M549 Dorsalgia, unspecified: Secondary | ICD-10-CM | POA: Diagnosis not present

## 2022-08-06 DIAGNOSIS — N261 Atrophy of kidney (terminal): Secondary | ICD-10-CM | POA: Diagnosis not present

## 2022-08-06 DIAGNOSIS — Z992 Dependence on renal dialysis: Secondary | ICD-10-CM | POA: Diagnosis not present

## 2022-08-06 DIAGNOSIS — M545 Low back pain, unspecified: Secondary | ICD-10-CM | POA: Diagnosis not present

## 2022-08-06 DIAGNOSIS — N250 Renal osteodystrophy: Secondary | ICD-10-CM | POA: Diagnosis not present

## 2022-08-06 DIAGNOSIS — N186 End stage renal disease: Secondary | ICD-10-CM | POA: Diagnosis not present

## 2022-08-06 DIAGNOSIS — R7881 Bacteremia: Secondary | ICD-10-CM | POA: Diagnosis not present

## 2022-08-06 DIAGNOSIS — I10 Essential (primary) hypertension: Secondary | ICD-10-CM | POA: Diagnosis not present

## 2022-08-06 DIAGNOSIS — M5136 Other intervertebral disc degeneration, lumbar region: Secondary | ICD-10-CM | POA: Diagnosis not present

## 2022-08-06 DIAGNOSIS — B957 Other staphylococcus as the cause of diseases classified elsewhere: Secondary | ICD-10-CM | POA: Diagnosis not present

## 2022-08-06 DIAGNOSIS — E875 Hyperkalemia: Secondary | ICD-10-CM | POA: Diagnosis not present

## 2022-08-07 DIAGNOSIS — N250 Renal osteodystrophy: Secondary | ICD-10-CM | POA: Diagnosis not present

## 2022-08-07 DIAGNOSIS — R7881 Bacteremia: Secondary | ICD-10-CM | POA: Diagnosis not present

## 2022-08-07 DIAGNOSIS — M549 Dorsalgia, unspecified: Secondary | ICD-10-CM | POA: Diagnosis not present

## 2022-08-07 DIAGNOSIS — Z992 Dependence on renal dialysis: Secondary | ICD-10-CM | POA: Diagnosis not present

## 2022-08-07 DIAGNOSIS — N186 End stage renal disease: Secondary | ICD-10-CM | POA: Diagnosis not present

## 2022-08-07 DIAGNOSIS — B957 Other staphylococcus as the cause of diseases classified elsewhere: Secondary | ICD-10-CM | POA: Diagnosis not present

## 2022-08-07 DIAGNOSIS — I10 Essential (primary) hypertension: Secondary | ICD-10-CM | POA: Diagnosis not present

## 2022-08-07 DIAGNOSIS — E875 Hyperkalemia: Secondary | ICD-10-CM | POA: Diagnosis not present

## 2022-08-08 DIAGNOSIS — M549 Dorsalgia, unspecified: Secondary | ICD-10-CM | POA: Diagnosis not present

## 2022-08-08 DIAGNOSIS — N250 Renal osteodystrophy: Secondary | ICD-10-CM | POA: Diagnosis not present

## 2022-08-08 DIAGNOSIS — E875 Hyperkalemia: Secondary | ICD-10-CM | POA: Diagnosis not present

## 2022-08-08 DIAGNOSIS — I10 Essential (primary) hypertension: Secondary | ICD-10-CM | POA: Diagnosis not present

## 2022-08-08 DIAGNOSIS — B957 Other staphylococcus as the cause of diseases classified elsewhere: Secondary | ICD-10-CM | POA: Diagnosis not present

## 2022-08-08 DIAGNOSIS — S199XXA Unspecified injury of neck, initial encounter: Secondary | ICD-10-CM | POA: Diagnosis not present

## 2022-08-08 DIAGNOSIS — M4854XA Collapsed vertebra, not elsewhere classified, thoracic region, initial encounter for fracture: Secondary | ICD-10-CM | POA: Diagnosis not present

## 2022-08-08 DIAGNOSIS — M5134 Other intervertebral disc degeneration, thoracic region: Secondary | ICD-10-CM | POA: Diagnosis not present

## 2022-08-08 DIAGNOSIS — R200 Anesthesia of skin: Secondary | ICD-10-CM | POA: Diagnosis not present

## 2022-08-08 DIAGNOSIS — N186 End stage renal disease: Secondary | ICD-10-CM | POA: Diagnosis not present

## 2022-08-08 DIAGNOSIS — M47812 Spondylosis without myelopathy or radiculopathy, cervical region: Secondary | ICD-10-CM | POA: Diagnosis not present

## 2022-08-08 DIAGNOSIS — M4322 Fusion of spine, cervical region: Secondary | ICD-10-CM | POA: Diagnosis not present

## 2022-08-08 DIAGNOSIS — R7881 Bacteremia: Secondary | ICD-10-CM | POA: Diagnosis not present

## 2022-08-09 DIAGNOSIS — Z992 Dependence on renal dialysis: Secondary | ICD-10-CM | POA: Diagnosis not present

## 2022-08-09 DIAGNOSIS — M549 Dorsalgia, unspecified: Secondary | ICD-10-CM | POA: Diagnosis not present

## 2022-08-09 DIAGNOSIS — I10 Essential (primary) hypertension: Secondary | ICD-10-CM | POA: Diagnosis not present

## 2022-08-09 DIAGNOSIS — E875 Hyperkalemia: Secondary | ICD-10-CM | POA: Diagnosis not present

## 2022-08-09 DIAGNOSIS — B957 Other staphylococcus as the cause of diseases classified elsewhere: Secondary | ICD-10-CM | POA: Diagnosis not present

## 2022-08-09 DIAGNOSIS — N186 End stage renal disease: Secondary | ICD-10-CM | POA: Diagnosis not present

## 2022-08-09 DIAGNOSIS — N250 Renal osteodystrophy: Secondary | ICD-10-CM | POA: Diagnosis not present

## 2022-08-09 DIAGNOSIS — R7881 Bacteremia: Secondary | ICD-10-CM | POA: Diagnosis not present

## 2022-08-10 DIAGNOSIS — M549 Dorsalgia, unspecified: Secondary | ICD-10-CM | POA: Diagnosis not present

## 2022-08-10 DIAGNOSIS — I10 Essential (primary) hypertension: Secondary | ICD-10-CM | POA: Diagnosis not present

## 2022-08-10 DIAGNOSIS — K219 Gastro-esophageal reflux disease without esophagitis: Secondary | ICD-10-CM | POA: Diagnosis not present

## 2022-08-10 DIAGNOSIS — R531 Weakness: Secondary | ICD-10-CM | POA: Diagnosis not present

## 2022-08-10 DIAGNOSIS — R0902 Hypoxemia: Secondary | ICD-10-CM | POA: Diagnosis not present

## 2022-08-10 DIAGNOSIS — M545 Low back pain, unspecified: Secondary | ICD-10-CM | POA: Diagnosis not present

## 2022-08-11 DIAGNOSIS — Z7689 Persons encountering health services in other specified circumstances: Secondary | ICD-10-CM | POA: Diagnosis not present

## 2022-08-11 DIAGNOSIS — M549 Dorsalgia, unspecified: Secondary | ICD-10-CM | POA: Diagnosis not present

## 2022-08-11 DIAGNOSIS — Z9889 Other specified postprocedural states: Secondary | ICD-10-CM | POA: Diagnosis not present

## 2022-08-12 DIAGNOSIS — G8929 Other chronic pain: Secondary | ICD-10-CM | POA: Diagnosis not present

## 2022-08-12 DIAGNOSIS — N250 Renal osteodystrophy: Secondary | ICD-10-CM | POA: Diagnosis not present

## 2022-08-12 DIAGNOSIS — N186 End stage renal disease: Secondary | ICD-10-CM | POA: Diagnosis not present

## 2022-08-12 DIAGNOSIS — R7881 Bacteremia: Secondary | ICD-10-CM | POA: Diagnosis not present

## 2022-08-12 DIAGNOSIS — M545 Low back pain, unspecified: Secondary | ICD-10-CM | POA: Diagnosis not present

## 2022-08-12 DIAGNOSIS — Z9981 Dependence on supplemental oxygen: Secondary | ICD-10-CM | POA: Diagnosis not present

## 2022-08-12 DIAGNOSIS — I120 Hypertensive chronic kidney disease with stage 5 chronic kidney disease or end stage renal disease: Secondary | ICD-10-CM | POA: Diagnosis not present

## 2022-08-12 DIAGNOSIS — Z86711 Personal history of pulmonary embolism: Secondary | ICD-10-CM | POA: Diagnosis not present

## 2022-08-12 DIAGNOSIS — Z79899 Other long term (current) drug therapy: Secondary | ICD-10-CM | POA: Diagnosis not present

## 2022-08-12 DIAGNOSIS — Z6841 Body Mass Index (BMI) 40.0 and over, adult: Secondary | ICD-10-CM | POA: Diagnosis not present

## 2022-08-12 DIAGNOSIS — D631 Anemia in chronic kidney disease: Secondary | ICD-10-CM | POA: Diagnosis not present

## 2022-08-12 DIAGNOSIS — M1712 Unilateral primary osteoarthritis, left knee: Secondary | ICD-10-CM | POA: Diagnosis not present

## 2022-08-12 DIAGNOSIS — B957 Other staphylococcus as the cause of diseases classified elsewhere: Secondary | ICD-10-CM | POA: Diagnosis not present

## 2022-08-12 DIAGNOSIS — G2581 Restless legs syndrome: Secondary | ICD-10-CM | POA: Diagnosis not present

## 2022-08-12 DIAGNOSIS — E669 Obesity, unspecified: Secondary | ICD-10-CM | POA: Diagnosis not present

## 2022-08-12 DIAGNOSIS — Z992 Dependence on renal dialysis: Secondary | ICD-10-CM | POA: Diagnosis not present

## 2022-08-12 DIAGNOSIS — E785 Hyperlipidemia, unspecified: Secondary | ICD-10-CM | POA: Diagnosis not present

## 2022-08-12 DIAGNOSIS — M1A9XX0 Chronic gout, unspecified, without tophus (tophi): Secondary | ICD-10-CM | POA: Diagnosis not present

## 2022-08-12 DIAGNOSIS — K219 Gastro-esophageal reflux disease without esophagitis: Secondary | ICD-10-CM | POA: Diagnosis not present

## 2022-08-12 DIAGNOSIS — Z9089 Acquired absence of other organs: Secondary | ICD-10-CM | POA: Diagnosis not present

## 2022-08-13 DIAGNOSIS — K5903 Drug induced constipation: Secondary | ICD-10-CM | POA: Diagnosis not present

## 2022-08-13 DIAGNOSIS — T426X5A Adverse effect of other antiepileptic and sedative-hypnotic drugs, initial encounter: Secondary | ICD-10-CM | POA: Diagnosis present

## 2022-08-13 DIAGNOSIS — M549 Dorsalgia, unspecified: Secondary | ICD-10-CM | POA: Diagnosis not present

## 2022-08-13 DIAGNOSIS — J9611 Chronic respiratory failure with hypoxia: Secondary | ICD-10-CM | POA: Diagnosis present

## 2022-08-13 DIAGNOSIS — I120 Hypertensive chronic kidney disease with stage 5 chronic kidney disease or end stage renal disease: Secondary | ICD-10-CM | POA: Diagnosis present

## 2022-08-13 DIAGNOSIS — K219 Gastro-esophageal reflux disease without esophagitis: Secondary | ICD-10-CM | POA: Diagnosis present

## 2022-08-13 DIAGNOSIS — R531 Weakness: Secondary | ICD-10-CM | POA: Diagnosis not present

## 2022-08-13 DIAGNOSIS — D631 Anemia in chronic kidney disease: Secondary | ICD-10-CM | POA: Diagnosis present

## 2022-08-13 DIAGNOSIS — E875 Hyperkalemia: Secondary | ICD-10-CM | POA: Diagnosis present

## 2022-08-13 DIAGNOSIS — Z7189 Other specified counseling: Secondary | ICD-10-CM | POA: Diagnosis not present

## 2022-08-13 DIAGNOSIS — G629 Polyneuropathy, unspecified: Secondary | ICD-10-CM | POA: Diagnosis present

## 2022-08-13 DIAGNOSIS — I10 Essential (primary) hypertension: Secondary | ICD-10-CM | POA: Diagnosis not present

## 2022-08-13 DIAGNOSIS — Z9981 Dependence on supplemental oxygen: Secondary | ICD-10-CM | POA: Diagnosis not present

## 2022-08-13 DIAGNOSIS — M545 Low back pain, unspecified: Secondary | ICD-10-CM | POA: Diagnosis not present

## 2022-08-13 DIAGNOSIS — N186 End stage renal disease: Secondary | ICD-10-CM | POA: Diagnosis present

## 2022-08-13 DIAGNOSIS — N250 Renal osteodystrophy: Secondary | ICD-10-CM | POA: Diagnosis present

## 2022-08-13 DIAGNOSIS — Z515 Encounter for palliative care: Secondary | ICD-10-CM | POA: Diagnosis not present

## 2022-08-13 DIAGNOSIS — Z86711 Personal history of pulmonary embolism: Secondary | ICD-10-CM | POA: Diagnosis not present

## 2022-08-13 DIAGNOSIS — M5459 Other low back pain: Secondary | ICD-10-CM | POA: Diagnosis present

## 2022-08-13 DIAGNOSIS — G253 Myoclonus: Secondary | ICD-10-CM | POA: Diagnosis present

## 2022-08-13 DIAGNOSIS — Z992 Dependence on renal dialysis: Secondary | ICD-10-CM | POA: Diagnosis not present

## 2022-08-13 DIAGNOSIS — M5414 Radiculopathy, thoracic region: Secondary | ICD-10-CM | POA: Diagnosis not present

## 2022-08-14 DIAGNOSIS — M5459 Other low back pain: Secondary | ICD-10-CM | POA: Diagnosis present

## 2022-08-14 DIAGNOSIS — G253 Myoclonus: Secondary | ICD-10-CM | POA: Diagnosis present

## 2022-08-14 DIAGNOSIS — G629 Polyneuropathy, unspecified: Secondary | ICD-10-CM | POA: Diagnosis present

## 2022-08-14 DIAGNOSIS — I120 Hypertensive chronic kidney disease with stage 5 chronic kidney disease or end stage renal disease: Secondary | ICD-10-CM | POA: Diagnosis present

## 2022-08-14 DIAGNOSIS — E875 Hyperkalemia: Secondary | ICD-10-CM | POA: Diagnosis present

## 2022-08-14 DIAGNOSIS — T426X5A Adverse effect of other antiepileptic and sedative-hypnotic drugs, initial encounter: Secondary | ICD-10-CM | POA: Diagnosis present

## 2022-08-14 DIAGNOSIS — Z515 Encounter for palliative care: Secondary | ICD-10-CM | POA: Diagnosis not present

## 2022-08-14 DIAGNOSIS — Z86711 Personal history of pulmonary embolism: Secondary | ICD-10-CM | POA: Diagnosis not present

## 2022-08-14 DIAGNOSIS — K5903 Drug induced constipation: Secondary | ICD-10-CM | POA: Diagnosis present

## 2022-08-14 DIAGNOSIS — M549 Dorsalgia, unspecified: Secondary | ICD-10-CM | POA: Diagnosis not present

## 2022-08-14 DIAGNOSIS — J9611 Chronic respiratory failure with hypoxia: Secondary | ICD-10-CM | POA: Diagnosis present

## 2022-08-14 DIAGNOSIS — N250 Renal osteodystrophy: Secondary | ICD-10-CM | POA: Diagnosis present

## 2022-08-14 DIAGNOSIS — N186 End stage renal disease: Secondary | ICD-10-CM | POA: Diagnosis present

## 2022-08-14 DIAGNOSIS — Z992 Dependence on renal dialysis: Secondary | ICD-10-CM | POA: Diagnosis not present

## 2022-08-14 DIAGNOSIS — Z9981 Dependence on supplemental oxygen: Secondary | ICD-10-CM | POA: Diagnosis not present

## 2022-08-14 DIAGNOSIS — K219 Gastro-esophageal reflux disease without esophagitis: Secondary | ICD-10-CM | POA: Diagnosis present

## 2022-08-14 DIAGNOSIS — Z7189 Other specified counseling: Secondary | ICD-10-CM | POA: Diagnosis not present

## 2022-08-14 DIAGNOSIS — D631 Anemia in chronic kidney disease: Secondary | ICD-10-CM | POA: Diagnosis present

## 2022-08-15 DIAGNOSIS — N186 End stage renal disease: Secondary | ICD-10-CM | POA: Diagnosis not present

## 2022-08-15 DIAGNOSIS — M549 Dorsalgia, unspecified: Secondary | ICD-10-CM | POA: Diagnosis not present

## 2022-08-15 DIAGNOSIS — Z992 Dependence on renal dialysis: Secondary | ICD-10-CM | POA: Diagnosis not present

## 2022-08-15 DIAGNOSIS — K5903 Drug induced constipation: Secondary | ICD-10-CM | POA: Diagnosis not present

## 2022-08-19 DIAGNOSIS — M1990 Unspecified osteoarthritis, unspecified site: Secondary | ICD-10-CM | POA: Diagnosis not present

## 2022-08-19 DIAGNOSIS — D631 Anemia in chronic kidney disease: Secondary | ICD-10-CM | POA: Diagnosis not present

## 2022-08-19 DIAGNOSIS — N19 Unspecified kidney failure: Secondary | ICD-10-CM | POA: Diagnosis not present

## 2022-08-19 DIAGNOSIS — Z8719 Personal history of other diseases of the digestive system: Secondary | ICD-10-CM | POA: Diagnosis not present

## 2022-08-19 DIAGNOSIS — A419 Sepsis, unspecified organism: Secondary | ICD-10-CM | POA: Diagnosis not present

## 2022-08-19 DIAGNOSIS — R9431 Abnormal electrocardiogram [ECG] [EKG]: Secondary | ICD-10-CM | POA: Diagnosis not present

## 2022-08-19 DIAGNOSIS — I509 Heart failure, unspecified: Secondary | ICD-10-CM | POA: Diagnosis not present

## 2022-08-19 DIAGNOSIS — B9620 Unspecified Escherichia coli [E. coli] as the cause of diseases classified elsewhere: Secondary | ICD-10-CM | POA: Diagnosis not present

## 2022-08-19 DIAGNOSIS — Z792 Long term (current) use of antibiotics: Secondary | ICD-10-CM | POA: Diagnosis not present

## 2022-08-19 DIAGNOSIS — Z79891 Long term (current) use of opiate analgesic: Secondary | ICD-10-CM | POA: Diagnosis not present

## 2022-08-19 DIAGNOSIS — R000 Tachycardia, unspecified: Secondary | ICD-10-CM | POA: Diagnosis not present

## 2022-08-19 DIAGNOSIS — M109 Gout, unspecified: Secondary | ICD-10-CM | POA: Diagnosis not present

## 2022-08-19 DIAGNOSIS — M545 Low back pain, unspecified: Secondary | ICD-10-CM | POA: Diagnosis not present

## 2022-08-19 DIAGNOSIS — G8929 Other chronic pain: Secondary | ICD-10-CM | POA: Diagnosis not present

## 2022-08-19 DIAGNOSIS — R509 Fever, unspecified: Secondary | ICD-10-CM | POA: Diagnosis not present

## 2022-08-19 DIAGNOSIS — Z888 Allergy status to other drugs, medicaments and biological substances status: Secondary | ICD-10-CM | POA: Diagnosis not present

## 2022-08-19 DIAGNOSIS — N390 Urinary tract infection, site not specified: Secondary | ICD-10-CM | POA: Diagnosis not present

## 2022-08-19 DIAGNOSIS — E875 Hyperkalemia: Secondary | ICD-10-CM | POA: Diagnosis not present

## 2022-08-19 DIAGNOSIS — Z9981 Dependence on supplemental oxygen: Secondary | ICD-10-CM | POA: Diagnosis not present

## 2022-08-19 DIAGNOSIS — Z79899 Other long term (current) drug therapy: Secondary | ICD-10-CM | POA: Diagnosis not present

## 2022-08-19 DIAGNOSIS — Z6841 Body Mass Index (BMI) 40.0 and over, adult: Secondary | ICD-10-CM | POA: Diagnosis not present

## 2022-08-19 DIAGNOSIS — K219 Gastro-esophageal reflux disease without esophagitis: Secondary | ICD-10-CM | POA: Diagnosis not present

## 2022-08-19 DIAGNOSIS — Z881 Allergy status to other antibiotic agents status: Secondary | ICD-10-CM | POA: Diagnosis not present

## 2022-08-19 DIAGNOSIS — J984 Other disorders of lung: Secondary | ICD-10-CM | POA: Diagnosis not present

## 2022-08-19 DIAGNOSIS — Z8739 Personal history of other diseases of the musculoskeletal system and connective tissue: Secondary | ICD-10-CM | POA: Diagnosis not present

## 2022-08-19 DIAGNOSIS — Z992 Dependence on renal dialysis: Secondary | ICD-10-CM | POA: Diagnosis not present

## 2022-08-19 DIAGNOSIS — R059 Cough, unspecified: Secondary | ICD-10-CM | POA: Diagnosis not present

## 2022-08-19 DIAGNOSIS — I132 Hypertensive heart and chronic kidney disease with heart failure and with stage 5 chronic kidney disease, or end stage renal disease: Secondary | ICD-10-CM | POA: Diagnosis not present

## 2022-08-19 DIAGNOSIS — N186 End stage renal disease: Secondary | ICD-10-CM | POA: Diagnosis not present

## 2022-08-19 DIAGNOSIS — F419 Anxiety disorder, unspecified: Secondary | ICD-10-CM | POA: Diagnosis not present

## 2022-08-20 DIAGNOSIS — I509 Heart failure, unspecified: Secondary | ICD-10-CM | POA: Diagnosis not present

## 2022-08-23 DIAGNOSIS — D631 Anemia in chronic kidney disease: Secondary | ICD-10-CM | POA: Diagnosis not present

## 2022-08-23 DIAGNOSIS — N2581 Secondary hyperparathyroidism of renal origin: Secondary | ICD-10-CM | POA: Diagnosis not present

## 2022-08-23 DIAGNOSIS — D509 Iron deficiency anemia, unspecified: Secondary | ICD-10-CM | POA: Diagnosis not present

## 2022-08-23 DIAGNOSIS — Z992 Dependence on renal dialysis: Secondary | ICD-10-CM | POA: Diagnosis not present

## 2022-08-23 DIAGNOSIS — N186 End stage renal disease: Secondary | ICD-10-CM | POA: Diagnosis not present

## 2022-08-23 DIAGNOSIS — N250 Renal osteodystrophy: Secondary | ICD-10-CM | POA: Diagnosis not present

## 2022-08-26 DIAGNOSIS — N250 Renal osteodystrophy: Secondary | ICD-10-CM | POA: Diagnosis not present

## 2022-08-26 DIAGNOSIS — D631 Anemia in chronic kidney disease: Secondary | ICD-10-CM | POA: Diagnosis not present

## 2022-08-26 DIAGNOSIS — N186 End stage renal disease: Secondary | ICD-10-CM | POA: Diagnosis not present

## 2022-08-26 DIAGNOSIS — Z992 Dependence on renal dialysis: Secondary | ICD-10-CM | POA: Diagnosis not present

## 2022-08-26 DIAGNOSIS — N2581 Secondary hyperparathyroidism of renal origin: Secondary | ICD-10-CM | POA: Diagnosis not present

## 2022-08-26 DIAGNOSIS — D509 Iron deficiency anemia, unspecified: Secondary | ICD-10-CM | POA: Diagnosis not present

## 2022-08-28 DIAGNOSIS — N250 Renal osteodystrophy: Secondary | ICD-10-CM | POA: Diagnosis not present

## 2022-08-28 DIAGNOSIS — Z992 Dependence on renal dialysis: Secondary | ICD-10-CM | POA: Diagnosis not present

## 2022-08-28 DIAGNOSIS — N186 End stage renal disease: Secondary | ICD-10-CM | POA: Diagnosis not present

## 2022-08-28 DIAGNOSIS — D509 Iron deficiency anemia, unspecified: Secondary | ICD-10-CM | POA: Diagnosis not present

## 2022-08-28 DIAGNOSIS — D631 Anemia in chronic kidney disease: Secondary | ICD-10-CM | POA: Diagnosis not present

## 2022-08-28 DIAGNOSIS — N2581 Secondary hyperparathyroidism of renal origin: Secondary | ICD-10-CM | POA: Diagnosis not present

## 2022-08-29 DIAGNOSIS — Z789 Other specified health status: Secondary | ICD-10-CM | POA: Diagnosis not present

## 2022-08-29 DIAGNOSIS — Z1339 Encounter for screening examination for other mental health and behavioral disorders: Secondary | ICD-10-CM | POA: Diagnosis not present

## 2022-08-29 DIAGNOSIS — Z7689 Persons encountering health services in other specified circumstances: Secondary | ICD-10-CM | POA: Diagnosis not present

## 2022-08-29 DIAGNOSIS — Z136 Encounter for screening for cardiovascular disorders: Secondary | ICD-10-CM | POA: Diagnosis not present

## 2022-08-29 DIAGNOSIS — N390 Urinary tract infection, site not specified: Secondary | ICD-10-CM | POA: Diagnosis not present

## 2022-08-29 DIAGNOSIS — Z0000 Encounter for general adult medical examination without abnormal findings: Secondary | ICD-10-CM | POA: Diagnosis not present

## 2022-08-29 DIAGNOSIS — Z139 Encounter for screening, unspecified: Secondary | ICD-10-CM | POA: Diagnosis not present

## 2022-08-29 DIAGNOSIS — Z992 Dependence on renal dialysis: Secondary | ICD-10-CM | POA: Diagnosis not present

## 2022-08-29 DIAGNOSIS — Z1389 Encounter for screening for other disorder: Secondary | ICD-10-CM | POA: Diagnosis not present

## 2022-08-29 DIAGNOSIS — A419 Sepsis, unspecified organism: Secondary | ICD-10-CM | POA: Diagnosis not present

## 2022-08-29 DIAGNOSIS — Z1331 Encounter for screening for depression: Secondary | ICD-10-CM | POA: Diagnosis not present

## 2022-08-30 DIAGNOSIS — N186 End stage renal disease: Secondary | ICD-10-CM | POA: Diagnosis not present

## 2022-08-30 DIAGNOSIS — N2581 Secondary hyperparathyroidism of renal origin: Secondary | ICD-10-CM | POA: Diagnosis not present

## 2022-08-30 DIAGNOSIS — D509 Iron deficiency anemia, unspecified: Secondary | ICD-10-CM | POA: Diagnosis not present

## 2022-08-30 DIAGNOSIS — N250 Renal osteodystrophy: Secondary | ICD-10-CM | POA: Diagnosis not present

## 2022-08-30 DIAGNOSIS — Z992 Dependence on renal dialysis: Secondary | ICD-10-CM | POA: Diagnosis not present

## 2022-08-30 DIAGNOSIS — D631 Anemia in chronic kidney disease: Secondary | ICD-10-CM | POA: Diagnosis not present

## 2022-09-01 DIAGNOSIS — M549 Dorsalgia, unspecified: Secondary | ICD-10-CM | POA: Diagnosis not present

## 2022-09-01 DIAGNOSIS — G8929 Other chronic pain: Secondary | ICD-10-CM | POA: Diagnosis not present

## 2022-09-01 DIAGNOSIS — D631 Anemia in chronic kidney disease: Secondary | ICD-10-CM | POA: Diagnosis not present

## 2022-09-01 DIAGNOSIS — N390 Urinary tract infection, site not specified: Secondary | ICD-10-CM | POA: Diagnosis not present

## 2022-09-01 DIAGNOSIS — Z79891 Long term (current) use of opiate analgesic: Secondary | ICD-10-CM | POA: Diagnosis not present

## 2022-09-01 DIAGNOSIS — I071 Rheumatic tricuspid insufficiency: Secondary | ICD-10-CM | POA: Diagnosis not present

## 2022-09-01 DIAGNOSIS — Z9181 History of falling: Secondary | ICD-10-CM | POA: Diagnosis not present

## 2022-09-01 DIAGNOSIS — M6281 Muscle weakness (generalized): Secondary | ICD-10-CM | POA: Diagnosis not present

## 2022-09-01 DIAGNOSIS — E875 Hyperkalemia: Secondary | ICD-10-CM | POA: Diagnosis not present

## 2022-09-01 DIAGNOSIS — E785 Hyperlipidemia, unspecified: Secondary | ICD-10-CM | POA: Diagnosis not present

## 2022-09-01 DIAGNOSIS — Z992 Dependence on renal dialysis: Secondary | ICD-10-CM | POA: Diagnosis not present

## 2022-09-01 DIAGNOSIS — Z79899 Other long term (current) drug therapy: Secondary | ICD-10-CM | POA: Diagnosis not present

## 2022-09-01 DIAGNOSIS — K5790 Diverticulosis of intestine, part unspecified, without perforation or abscess without bleeding: Secondary | ICD-10-CM | POA: Diagnosis not present

## 2022-09-01 DIAGNOSIS — Z9981 Dependence on supplemental oxygen: Secondary | ICD-10-CM | POA: Diagnosis not present

## 2022-09-01 DIAGNOSIS — A4151 Sepsis due to Escherichia coli [E. coli]: Secondary | ICD-10-CM | POA: Diagnosis not present

## 2022-09-01 DIAGNOSIS — N186 End stage renal disease: Secondary | ICD-10-CM | POA: Diagnosis not present

## 2022-09-01 DIAGNOSIS — M109 Gout, unspecified: Secondary | ICD-10-CM | POA: Diagnosis not present

## 2022-09-01 DIAGNOSIS — B9620 Unspecified Escherichia coli [E. coli] as the cause of diseases classified elsewhere: Secondary | ICD-10-CM | POA: Diagnosis not present

## 2022-09-01 DIAGNOSIS — J811 Chronic pulmonary edema: Secondary | ICD-10-CM | POA: Diagnosis not present

## 2022-09-01 DIAGNOSIS — I120 Hypertensive chronic kidney disease with stage 5 chronic kidney disease or end stage renal disease: Secondary | ICD-10-CM | POA: Diagnosis not present

## 2022-09-03 DIAGNOSIS — B9620 Unspecified Escherichia coli [E. coli] as the cause of diseases classified elsewhere: Secondary | ICD-10-CM | POA: Diagnosis not present

## 2022-09-03 DIAGNOSIS — I120 Hypertensive chronic kidney disease with stage 5 chronic kidney disease or end stage renal disease: Secondary | ICD-10-CM | POA: Diagnosis not present

## 2022-09-03 DIAGNOSIS — M549 Dorsalgia, unspecified: Secondary | ICD-10-CM | POA: Diagnosis not present

## 2022-09-03 DIAGNOSIS — A4151 Sepsis due to Escherichia coli [E. coli]: Secondary | ICD-10-CM | POA: Diagnosis not present

## 2022-09-03 DIAGNOSIS — N390 Urinary tract infection, site not specified: Secondary | ICD-10-CM | POA: Diagnosis not present

## 2022-09-03 DIAGNOSIS — G8929 Other chronic pain: Secondary | ICD-10-CM | POA: Diagnosis not present

## 2022-09-04 DIAGNOSIS — N2581 Secondary hyperparathyroidism of renal origin: Secondary | ICD-10-CM | POA: Diagnosis not present

## 2022-09-04 DIAGNOSIS — N186 End stage renal disease: Secondary | ICD-10-CM | POA: Diagnosis not present

## 2022-09-04 DIAGNOSIS — Z992 Dependence on renal dialysis: Secondary | ICD-10-CM | POA: Diagnosis not present

## 2022-09-04 DIAGNOSIS — D509 Iron deficiency anemia, unspecified: Secondary | ICD-10-CM | POA: Diagnosis not present

## 2022-09-04 DIAGNOSIS — D631 Anemia in chronic kidney disease: Secondary | ICD-10-CM | POA: Diagnosis not present

## 2022-09-04 DIAGNOSIS — N250 Renal osteodystrophy: Secondary | ICD-10-CM | POA: Diagnosis not present

## 2022-09-05 DIAGNOSIS — G8929 Other chronic pain: Secondary | ICD-10-CM | POA: Diagnosis not present

## 2022-09-05 DIAGNOSIS — M549 Dorsalgia, unspecified: Secondary | ICD-10-CM | POA: Diagnosis not present

## 2022-09-05 DIAGNOSIS — B9620 Unspecified Escherichia coli [E. coli] as the cause of diseases classified elsewhere: Secondary | ICD-10-CM | POA: Diagnosis not present

## 2022-09-05 DIAGNOSIS — I120 Hypertensive chronic kidney disease with stage 5 chronic kidney disease or end stage renal disease: Secondary | ICD-10-CM | POA: Diagnosis not present

## 2022-09-05 DIAGNOSIS — A4151 Sepsis due to Escherichia coli [E. coli]: Secondary | ICD-10-CM | POA: Diagnosis not present

## 2022-09-05 DIAGNOSIS — N390 Urinary tract infection, site not specified: Secondary | ICD-10-CM | POA: Diagnosis not present

## 2022-09-06 DIAGNOSIS — D509 Iron deficiency anemia, unspecified: Secondary | ICD-10-CM | POA: Diagnosis not present

## 2022-09-06 DIAGNOSIS — N2581 Secondary hyperparathyroidism of renal origin: Secondary | ICD-10-CM | POA: Diagnosis not present

## 2022-09-06 DIAGNOSIS — Z992 Dependence on renal dialysis: Secondary | ICD-10-CM | POA: Diagnosis not present

## 2022-09-06 DIAGNOSIS — N186 End stage renal disease: Secondary | ICD-10-CM | POA: Diagnosis not present

## 2022-09-06 DIAGNOSIS — N250 Renal osteodystrophy: Secondary | ICD-10-CM | POA: Diagnosis not present

## 2022-09-06 DIAGNOSIS — D631 Anemia in chronic kidney disease: Secondary | ICD-10-CM | POA: Diagnosis not present

## 2022-09-08 DIAGNOSIS — M549 Dorsalgia, unspecified: Secondary | ICD-10-CM | POA: Diagnosis not present

## 2022-09-08 DIAGNOSIS — G8929 Other chronic pain: Secondary | ICD-10-CM | POA: Diagnosis not present

## 2022-09-08 DIAGNOSIS — A4151 Sepsis due to Escherichia coli [E. coli]: Secondary | ICD-10-CM | POA: Diagnosis not present

## 2022-09-08 DIAGNOSIS — N390 Urinary tract infection, site not specified: Secondary | ICD-10-CM | POA: Diagnosis not present

## 2022-09-08 DIAGNOSIS — I120 Hypertensive chronic kidney disease with stage 5 chronic kidney disease or end stage renal disease: Secondary | ICD-10-CM | POA: Diagnosis not present

## 2022-09-08 DIAGNOSIS — B9620 Unspecified Escherichia coli [E. coli] as the cause of diseases classified elsewhere: Secondary | ICD-10-CM | POA: Diagnosis not present

## 2022-09-10 DIAGNOSIS — D631 Anemia in chronic kidney disease: Secondary | ICD-10-CM | POA: Diagnosis not present

## 2022-09-10 DIAGNOSIS — Z992 Dependence on renal dialysis: Secondary | ICD-10-CM | POA: Diagnosis not present

## 2022-09-10 DIAGNOSIS — D509 Iron deficiency anemia, unspecified: Secondary | ICD-10-CM | POA: Diagnosis not present

## 2022-09-10 DIAGNOSIS — N250 Renal osteodystrophy: Secondary | ICD-10-CM | POA: Diagnosis not present

## 2022-09-10 DIAGNOSIS — N2581 Secondary hyperparathyroidism of renal origin: Secondary | ICD-10-CM | POA: Diagnosis not present

## 2022-09-10 DIAGNOSIS — N186 End stage renal disease: Secondary | ICD-10-CM | POA: Diagnosis not present

## 2022-09-12 DIAGNOSIS — B9620 Unspecified Escherichia coli [E. coli] as the cause of diseases classified elsewhere: Secondary | ICD-10-CM | POA: Diagnosis not present

## 2022-09-12 DIAGNOSIS — M549 Dorsalgia, unspecified: Secondary | ICD-10-CM | POA: Diagnosis not present

## 2022-09-12 DIAGNOSIS — G8929 Other chronic pain: Secondary | ICD-10-CM | POA: Diagnosis not present

## 2022-09-12 DIAGNOSIS — N390 Urinary tract infection, site not specified: Secondary | ICD-10-CM | POA: Diagnosis not present

## 2022-09-12 DIAGNOSIS — I120 Hypertensive chronic kidney disease with stage 5 chronic kidney disease or end stage renal disease: Secondary | ICD-10-CM | POA: Diagnosis not present

## 2022-09-12 DIAGNOSIS — A4151 Sepsis due to Escherichia coli [E. coli]: Secondary | ICD-10-CM | POA: Diagnosis not present

## 2022-09-13 DIAGNOSIS — N2581 Secondary hyperparathyroidism of renal origin: Secondary | ICD-10-CM | POA: Diagnosis not present

## 2022-09-13 DIAGNOSIS — D631 Anemia in chronic kidney disease: Secondary | ICD-10-CM | POA: Diagnosis not present

## 2022-09-13 DIAGNOSIS — N186 End stage renal disease: Secondary | ICD-10-CM | POA: Diagnosis not present

## 2022-09-13 DIAGNOSIS — D509 Iron deficiency anemia, unspecified: Secondary | ICD-10-CM | POA: Diagnosis not present

## 2022-09-13 DIAGNOSIS — Z992 Dependence on renal dialysis: Secondary | ICD-10-CM | POA: Diagnosis not present

## 2022-09-13 DIAGNOSIS — N250 Renal osteodystrophy: Secondary | ICD-10-CM | POA: Diagnosis not present

## 2022-09-15 DIAGNOSIS — J811 Chronic pulmonary edema: Secondary | ICD-10-CM | POA: Diagnosis not present

## 2022-09-15 DIAGNOSIS — R0602 Shortness of breath: Secondary | ICD-10-CM | POA: Diagnosis not present

## 2022-09-17 DIAGNOSIS — I120 Hypertensive chronic kidney disease with stage 5 chronic kidney disease or end stage renal disease: Secondary | ICD-10-CM | POA: Diagnosis not present

## 2022-09-17 DIAGNOSIS — A4151 Sepsis due to Escherichia coli [E. coli]: Secondary | ICD-10-CM | POA: Diagnosis not present

## 2022-09-17 DIAGNOSIS — N390 Urinary tract infection, site not specified: Secondary | ICD-10-CM | POA: Diagnosis not present

## 2022-09-17 DIAGNOSIS — B9620 Unspecified Escherichia coli [E. coli] as the cause of diseases classified elsewhere: Secondary | ICD-10-CM | POA: Diagnosis not present

## 2022-09-17 DIAGNOSIS — M549 Dorsalgia, unspecified: Secondary | ICD-10-CM | POA: Diagnosis not present

## 2022-09-17 DIAGNOSIS — G8929 Other chronic pain: Secondary | ICD-10-CM | POA: Diagnosis not present

## 2022-09-18 DIAGNOSIS — G8929 Other chronic pain: Secondary | ICD-10-CM | POA: Diagnosis not present

## 2022-09-18 DIAGNOSIS — Z992 Dependence on renal dialysis: Secondary | ICD-10-CM | POA: Diagnosis not present

## 2022-09-18 DIAGNOSIS — J811 Chronic pulmonary edema: Secondary | ICD-10-CM | POA: Diagnosis not present

## 2022-09-18 DIAGNOSIS — R0603 Acute respiratory distress: Secondary | ICD-10-CM | POA: Diagnosis not present

## 2022-09-18 DIAGNOSIS — Z6841 Body Mass Index (BMI) 40.0 and over, adult: Secondary | ICD-10-CM | POA: Diagnosis not present

## 2022-09-18 DIAGNOSIS — R079 Chest pain, unspecified: Secondary | ICD-10-CM | POA: Diagnosis not present

## 2022-09-18 DIAGNOSIS — J9601 Acute respiratory failure with hypoxia: Secondary | ICD-10-CM | POA: Diagnosis not present

## 2022-09-18 DIAGNOSIS — R1012 Left upper quadrant pain: Secondary | ICD-10-CM | POA: Diagnosis not present

## 2022-09-18 DIAGNOSIS — M546 Pain in thoracic spine: Secondary | ICD-10-CM | POA: Diagnosis not present

## 2022-09-18 DIAGNOSIS — Z79899 Other long term (current) drug therapy: Secondary | ICD-10-CM | POA: Diagnosis not present

## 2022-09-18 DIAGNOSIS — I132 Hypertensive heart and chronic kidney disease with heart failure and with stage 5 chronic kidney disease, or end stage renal disease: Secondary | ICD-10-CM | POA: Diagnosis not present

## 2022-09-18 DIAGNOSIS — I5031 Acute diastolic (congestive) heart failure: Secondary | ICD-10-CM | POA: Diagnosis not present

## 2022-09-18 DIAGNOSIS — R918 Other nonspecific abnormal finding of lung field: Secondary | ICD-10-CM | POA: Diagnosis not present

## 2022-09-18 DIAGNOSIS — M109 Gout, unspecified: Secondary | ICD-10-CM | POA: Diagnosis not present

## 2022-09-18 DIAGNOSIS — N186 End stage renal disease: Secondary | ICD-10-CM | POA: Diagnosis not present

## 2022-09-18 DIAGNOSIS — I509 Heart failure, unspecified: Secondary | ICD-10-CM | POA: Diagnosis not present

## 2022-09-18 DIAGNOSIS — Z9981 Dependence on supplemental oxygen: Secondary | ICD-10-CM | POA: Diagnosis not present

## 2022-09-18 DIAGNOSIS — D631 Anemia in chronic kidney disease: Secondary | ICD-10-CM | POA: Diagnosis not present

## 2022-09-18 DIAGNOSIS — E8770 Fluid overload, unspecified: Secondary | ICD-10-CM | POA: Diagnosis not present

## 2022-09-18 DIAGNOSIS — J810 Acute pulmonary edema: Secondary | ICD-10-CM | POA: Diagnosis not present

## 2022-09-18 DIAGNOSIS — I517 Cardiomegaly: Secondary | ICD-10-CM | POA: Diagnosis not present

## 2022-09-18 DIAGNOSIS — J9621 Acute and chronic respiratory failure with hypoxia: Secondary | ICD-10-CM | POA: Diagnosis not present

## 2022-09-18 DIAGNOSIS — E875 Hyperkalemia: Secondary | ICD-10-CM | POA: Diagnosis not present

## 2022-09-18 DIAGNOSIS — I3481 Nonrheumatic mitral (valve) annulus calcification: Secondary | ICD-10-CM | POA: Diagnosis not present

## 2022-09-18 DIAGNOSIS — I160 Hypertensive urgency: Secondary | ICD-10-CM | POA: Diagnosis not present

## 2022-09-18 DIAGNOSIS — Z86711 Personal history of pulmonary embolism: Secondary | ICD-10-CM | POA: Diagnosis not present

## 2022-09-18 DIAGNOSIS — K219 Gastro-esophageal reflux disease without esophagitis: Secondary | ICD-10-CM | POA: Diagnosis not present

## 2022-09-23 DIAGNOSIS — Z03818 Encounter for observation for suspected exposure to other biological agents ruled out: Secondary | ICD-10-CM | POA: Diagnosis not present

## 2022-09-24 DIAGNOSIS — B9620 Unspecified Escherichia coli [E. coli] as the cause of diseases classified elsewhere: Secondary | ICD-10-CM | POA: Diagnosis not present

## 2022-09-24 DIAGNOSIS — M549 Dorsalgia, unspecified: Secondary | ICD-10-CM | POA: Diagnosis not present

## 2022-09-24 DIAGNOSIS — N390 Urinary tract infection, site not specified: Secondary | ICD-10-CM | POA: Diagnosis not present

## 2022-09-24 DIAGNOSIS — A4151 Sepsis due to Escherichia coli [E. coli]: Secondary | ICD-10-CM | POA: Diagnosis not present

## 2022-09-24 DIAGNOSIS — I120 Hypertensive chronic kidney disease with stage 5 chronic kidney disease or end stage renal disease: Secondary | ICD-10-CM | POA: Diagnosis not present

## 2022-09-24 DIAGNOSIS — G8929 Other chronic pain: Secondary | ICD-10-CM | POA: Diagnosis not present

## 2022-09-25 DIAGNOSIS — I5030 Unspecified diastolic (congestive) heart failure: Secondary | ICD-10-CM | POA: Diagnosis not present

## 2022-09-25 DIAGNOSIS — N250 Renal osteodystrophy: Secondary | ICD-10-CM | POA: Diagnosis not present

## 2022-09-25 DIAGNOSIS — N186 End stage renal disease: Secondary | ICD-10-CM | POA: Diagnosis not present

## 2022-09-25 DIAGNOSIS — D631 Anemia in chronic kidney disease: Secondary | ICD-10-CM | POA: Diagnosis not present

## 2022-09-25 DIAGNOSIS — Z6841 Body Mass Index (BMI) 40.0 and over, adult: Secondary | ICD-10-CM | POA: Diagnosis not present

## 2022-09-25 DIAGNOSIS — D509 Iron deficiency anemia, unspecified: Secondary | ICD-10-CM | POA: Diagnosis not present

## 2022-09-25 DIAGNOSIS — Z7689 Persons encountering health services in other specified circumstances: Secondary | ICD-10-CM | POA: Diagnosis not present

## 2022-09-25 DIAGNOSIS — Z992 Dependence on renal dialysis: Secondary | ICD-10-CM | POA: Diagnosis not present

## 2022-09-25 DIAGNOSIS — N2581 Secondary hyperparathyroidism of renal origin: Secondary | ICD-10-CM | POA: Diagnosis not present

## 2022-09-27 DIAGNOSIS — D509 Iron deficiency anemia, unspecified: Secondary | ICD-10-CM | POA: Diagnosis not present

## 2022-09-27 DIAGNOSIS — D631 Anemia in chronic kidney disease: Secondary | ICD-10-CM | POA: Diagnosis not present

## 2022-09-27 DIAGNOSIS — N2581 Secondary hyperparathyroidism of renal origin: Secondary | ICD-10-CM | POA: Diagnosis not present

## 2022-09-27 DIAGNOSIS — N250 Renal osteodystrophy: Secondary | ICD-10-CM | POA: Diagnosis not present

## 2022-09-27 DIAGNOSIS — Z992 Dependence on renal dialysis: Secondary | ICD-10-CM | POA: Diagnosis not present

## 2022-09-27 DIAGNOSIS — N186 End stage renal disease: Secondary | ICD-10-CM | POA: Diagnosis not present

## 2022-09-29 DIAGNOSIS — N390 Urinary tract infection, site not specified: Secondary | ICD-10-CM | POA: Diagnosis not present

## 2022-09-29 DIAGNOSIS — B9620 Unspecified Escherichia coli [E. coli] as the cause of diseases classified elsewhere: Secondary | ICD-10-CM | POA: Diagnosis not present

## 2022-09-29 DIAGNOSIS — M549 Dorsalgia, unspecified: Secondary | ICD-10-CM | POA: Diagnosis not present

## 2022-09-29 DIAGNOSIS — I120 Hypertensive chronic kidney disease with stage 5 chronic kidney disease or end stage renal disease: Secondary | ICD-10-CM | POA: Diagnosis not present

## 2022-09-29 DIAGNOSIS — J9610 Chronic respiratory failure, unspecified whether with hypoxia or hypercapnia: Secondary | ICD-10-CM | POA: Diagnosis not present

## 2022-09-29 DIAGNOSIS — G8929 Other chronic pain: Secondary | ICD-10-CM | POA: Diagnosis not present

## 2022-09-29 DIAGNOSIS — A4151 Sepsis due to Escherichia coli [E. coli]: Secondary | ICD-10-CM | POA: Diagnosis not present

## 2022-09-29 DIAGNOSIS — N186 End stage renal disease: Secondary | ICD-10-CM | POA: Diagnosis not present

## 2022-09-30 DIAGNOSIS — N186 End stage renal disease: Secondary | ICD-10-CM | POA: Diagnosis not present

## 2022-09-30 DIAGNOSIS — N250 Renal osteodystrophy: Secondary | ICD-10-CM | POA: Diagnosis not present

## 2022-09-30 DIAGNOSIS — D631 Anemia in chronic kidney disease: Secondary | ICD-10-CM | POA: Diagnosis not present

## 2022-09-30 DIAGNOSIS — Z992 Dependence on renal dialysis: Secondary | ICD-10-CM | POA: Diagnosis not present

## 2022-10-01 DIAGNOSIS — Z9181 History of falling: Secondary | ICD-10-CM | POA: Diagnosis not present

## 2022-10-01 DIAGNOSIS — E785 Hyperlipidemia, unspecified: Secondary | ICD-10-CM | POA: Diagnosis not present

## 2022-10-01 DIAGNOSIS — Z79891 Long term (current) use of opiate analgesic: Secondary | ICD-10-CM | POA: Diagnosis not present

## 2022-10-01 DIAGNOSIS — Z87440 Personal history of urinary (tract) infections: Secondary | ICD-10-CM | POA: Diagnosis not present

## 2022-10-01 DIAGNOSIS — J811 Chronic pulmonary edema: Secondary | ICD-10-CM | POA: Diagnosis not present

## 2022-10-01 DIAGNOSIS — M109 Gout, unspecified: Secondary | ICD-10-CM | POA: Diagnosis not present

## 2022-10-01 DIAGNOSIS — Z992 Dependence on renal dialysis: Secondary | ICD-10-CM | POA: Diagnosis not present

## 2022-10-01 DIAGNOSIS — M549 Dorsalgia, unspecified: Secondary | ICD-10-CM | POA: Diagnosis not present

## 2022-10-01 DIAGNOSIS — N186 End stage renal disease: Secondary | ICD-10-CM | POA: Diagnosis not present

## 2022-10-01 DIAGNOSIS — I132 Hypertensive heart and chronic kidney disease with heart failure and with stage 5 chronic kidney disease, or end stage renal disease: Secondary | ICD-10-CM | POA: Diagnosis not present

## 2022-10-01 DIAGNOSIS — D631 Anemia in chronic kidney disease: Secondary | ICD-10-CM | POA: Diagnosis not present

## 2022-10-01 DIAGNOSIS — Z79899 Other long term (current) drug therapy: Secondary | ICD-10-CM | POA: Diagnosis not present

## 2022-10-01 DIAGNOSIS — K5790 Diverticulosis of intestine, part unspecified, without perforation or abscess without bleeding: Secondary | ICD-10-CM | POA: Diagnosis not present

## 2022-10-01 DIAGNOSIS — I071 Rheumatic tricuspid insufficiency: Secondary | ICD-10-CM | POA: Diagnosis not present

## 2022-10-01 DIAGNOSIS — E875 Hyperkalemia: Secondary | ICD-10-CM | POA: Diagnosis not present

## 2022-10-01 DIAGNOSIS — M6281 Muscle weakness (generalized): Secondary | ICD-10-CM | POA: Diagnosis not present

## 2022-10-01 DIAGNOSIS — J9621 Acute and chronic respiratory failure with hypoxia: Secondary | ICD-10-CM | POA: Diagnosis not present

## 2022-10-01 DIAGNOSIS — Z9981 Dependence on supplemental oxygen: Secondary | ICD-10-CM | POA: Diagnosis not present

## 2022-10-01 DIAGNOSIS — G8929 Other chronic pain: Secondary | ICD-10-CM | POA: Diagnosis not present

## 2022-10-01 DIAGNOSIS — I5031 Acute diastolic (congestive) heart failure: Secondary | ICD-10-CM | POA: Diagnosis not present

## 2022-10-02 DIAGNOSIS — Z992 Dependence on renal dialysis: Secondary | ICD-10-CM | POA: Diagnosis not present

## 2022-10-02 DIAGNOSIS — N250 Renal osteodystrophy: Secondary | ICD-10-CM | POA: Diagnosis not present

## 2022-10-02 DIAGNOSIS — D631 Anemia in chronic kidney disease: Secondary | ICD-10-CM | POA: Diagnosis not present

## 2022-10-02 DIAGNOSIS — N186 End stage renal disease: Secondary | ICD-10-CM | POA: Diagnosis not present

## 2022-10-03 DIAGNOSIS — I132 Hypertensive heart and chronic kidney disease with heart failure and with stage 5 chronic kidney disease, or end stage renal disease: Secondary | ICD-10-CM | POA: Diagnosis not present

## 2022-10-03 DIAGNOSIS — N186 End stage renal disease: Secondary | ICD-10-CM | POA: Diagnosis not present

## 2022-10-03 DIAGNOSIS — J9621 Acute and chronic respiratory failure with hypoxia: Secondary | ICD-10-CM | POA: Diagnosis not present

## 2022-10-03 DIAGNOSIS — G8929 Other chronic pain: Secondary | ICD-10-CM | POA: Diagnosis not present

## 2022-10-03 DIAGNOSIS — I5031 Acute diastolic (congestive) heart failure: Secondary | ICD-10-CM | POA: Diagnosis not present

## 2022-10-03 DIAGNOSIS — D631 Anemia in chronic kidney disease: Secondary | ICD-10-CM | POA: Diagnosis not present

## 2022-10-04 DIAGNOSIS — Z992 Dependence on renal dialysis: Secondary | ICD-10-CM | POA: Diagnosis not present

## 2022-10-04 DIAGNOSIS — N250 Renal osteodystrophy: Secondary | ICD-10-CM | POA: Diagnosis not present

## 2022-10-04 DIAGNOSIS — N186 End stage renal disease: Secondary | ICD-10-CM | POA: Diagnosis not present

## 2022-10-04 DIAGNOSIS — D631 Anemia in chronic kidney disease: Secondary | ICD-10-CM | POA: Diagnosis not present

## 2022-10-08 DIAGNOSIS — G8929 Other chronic pain: Secondary | ICD-10-CM | POA: Diagnosis not present

## 2022-10-08 DIAGNOSIS — N186 End stage renal disease: Secondary | ICD-10-CM | POA: Diagnosis not present

## 2022-10-08 DIAGNOSIS — I132 Hypertensive heart and chronic kidney disease with heart failure and with stage 5 chronic kidney disease, or end stage renal disease: Secondary | ICD-10-CM | POA: Diagnosis not present

## 2022-10-08 DIAGNOSIS — N250 Renal osteodystrophy: Secondary | ICD-10-CM | POA: Diagnosis not present

## 2022-10-08 DIAGNOSIS — J9621 Acute and chronic respiratory failure with hypoxia: Secondary | ICD-10-CM | POA: Diagnosis not present

## 2022-10-08 DIAGNOSIS — D631 Anemia in chronic kidney disease: Secondary | ICD-10-CM | POA: Diagnosis not present

## 2022-10-08 DIAGNOSIS — I5031 Acute diastolic (congestive) heart failure: Secondary | ICD-10-CM | POA: Diagnosis not present

## 2022-10-08 DIAGNOSIS — Z992 Dependence on renal dialysis: Secondary | ICD-10-CM | POA: Diagnosis not present

## 2022-10-09 DIAGNOSIS — N186 End stage renal disease: Secondary | ICD-10-CM | POA: Diagnosis not present

## 2022-10-09 DIAGNOSIS — N250 Renal osteodystrophy: Secondary | ICD-10-CM | POA: Diagnosis not present

## 2022-10-09 DIAGNOSIS — Z992 Dependence on renal dialysis: Secondary | ICD-10-CM | POA: Diagnosis not present

## 2022-10-09 DIAGNOSIS — D631 Anemia in chronic kidney disease: Secondary | ICD-10-CM | POA: Diagnosis not present

## 2022-10-10 DIAGNOSIS — N186 End stage renal disease: Secondary | ICD-10-CM | POA: Diagnosis not present

## 2022-10-10 DIAGNOSIS — J9621 Acute and chronic respiratory failure with hypoxia: Secondary | ICD-10-CM | POA: Diagnosis not present

## 2022-10-10 DIAGNOSIS — I5031 Acute diastolic (congestive) heart failure: Secondary | ICD-10-CM | POA: Diagnosis not present

## 2022-10-10 DIAGNOSIS — G8929 Other chronic pain: Secondary | ICD-10-CM | POA: Diagnosis not present

## 2022-10-10 DIAGNOSIS — I132 Hypertensive heart and chronic kidney disease with heart failure and with stage 5 chronic kidney disease, or end stage renal disease: Secondary | ICD-10-CM | POA: Diagnosis not present

## 2022-10-10 DIAGNOSIS — D631 Anemia in chronic kidney disease: Secondary | ICD-10-CM | POA: Diagnosis not present

## 2022-10-13 DIAGNOSIS — Z91199 Patient's noncompliance with other medical treatment and regimen due to unspecified reason: Secondary | ICD-10-CM | POA: Diagnosis not present

## 2022-10-13 DIAGNOSIS — K219 Gastro-esophageal reflux disease without esophagitis: Secondary | ICD-10-CM | POA: Diagnosis not present

## 2022-10-13 DIAGNOSIS — E872 Acidosis, unspecified: Secondary | ICD-10-CM | POA: Diagnosis present

## 2022-10-13 DIAGNOSIS — M4624 Osteomyelitis of vertebra, thoracic region: Secondary | ICD-10-CM | POA: Diagnosis not present

## 2022-10-13 DIAGNOSIS — I517 Cardiomegaly: Secondary | ICD-10-CM | POA: Diagnosis not present

## 2022-10-13 DIAGNOSIS — M549 Dorsalgia, unspecified: Secondary | ICD-10-CM | POA: Diagnosis not present

## 2022-10-13 DIAGNOSIS — Z91158 Patient's noncompliance with renal dialysis for other reason: Secondary | ICD-10-CM | POA: Diagnosis not present

## 2022-10-13 DIAGNOSIS — R1010 Upper abdominal pain, unspecified: Secondary | ICD-10-CM | POA: Diagnosis not present

## 2022-10-13 DIAGNOSIS — E875 Hyperkalemia: Secondary | ICD-10-CM | POA: Diagnosis present

## 2022-10-13 DIAGNOSIS — I120 Hypertensive chronic kidney disease with stage 5 chronic kidney disease or end stage renal disease: Secondary | ICD-10-CM | POA: Diagnosis not present

## 2022-10-13 DIAGNOSIS — Z992 Dependence on renal dialysis: Secondary | ICD-10-CM | POA: Diagnosis not present

## 2022-10-13 DIAGNOSIS — I5033 Acute on chronic diastolic (congestive) heart failure: Secondary | ICD-10-CM | POA: Diagnosis present

## 2022-10-13 DIAGNOSIS — R918 Other nonspecific abnormal finding of lung field: Secondary | ICD-10-CM | POA: Diagnosis not present

## 2022-10-13 DIAGNOSIS — A429 Actinomycosis, unspecified: Secondary | ICD-10-CM | POA: Diagnosis present

## 2022-10-13 DIAGNOSIS — I5032 Chronic diastolic (congestive) heart failure: Secondary | ICD-10-CM | POA: Diagnosis not present

## 2022-10-13 DIAGNOSIS — M546 Pain in thoracic spine: Secondary | ICD-10-CM | POA: Diagnosis not present

## 2022-10-13 DIAGNOSIS — J9621 Acute and chronic respiratory failure with hypoxia: Secondary | ICD-10-CM | POA: Diagnosis present

## 2022-10-13 DIAGNOSIS — I10 Essential (primary) hypertension: Secondary | ICD-10-CM | POA: Diagnosis not present

## 2022-10-13 DIAGNOSIS — M1990 Unspecified osteoarthritis, unspecified site: Secondary | ICD-10-CM | POA: Diagnosis not present

## 2022-10-13 DIAGNOSIS — J811 Chronic pulmonary edema: Secondary | ICD-10-CM | POA: Diagnosis not present

## 2022-10-13 DIAGNOSIS — I509 Heart failure, unspecified: Secondary | ICD-10-CM | POA: Diagnosis not present

## 2022-10-13 DIAGNOSIS — M1A9XX0 Chronic gout, unspecified, without tophus (tophi): Secondary | ICD-10-CM | POA: Diagnosis not present

## 2022-10-13 DIAGNOSIS — R7881 Bacteremia: Secondary | ICD-10-CM | POA: Diagnosis present

## 2022-10-13 DIAGNOSIS — I132 Hypertensive heart and chronic kidney disease with heart failure and with stage 5 chronic kidney disease, or end stage renal disease: Secondary | ICD-10-CM | POA: Diagnosis present

## 2022-10-13 DIAGNOSIS — Z9981 Dependence on supplemental oxygen: Secondary | ICD-10-CM | POA: Diagnosis not present

## 2022-10-13 DIAGNOSIS — Z96652 Presence of left artificial knee joint: Secondary | ICD-10-CM | POA: Diagnosis not present

## 2022-10-13 DIAGNOSIS — K5903 Drug induced constipation: Secondary | ICD-10-CM | POA: Diagnosis present

## 2022-10-13 DIAGNOSIS — Z6841 Body Mass Index (BMI) 40.0 and over, adult: Secondary | ICD-10-CM | POA: Diagnosis not present

## 2022-10-13 DIAGNOSIS — M47816 Spondylosis without myelopathy or radiculopathy, lumbar region: Secondary | ICD-10-CM | POA: Diagnosis not present

## 2022-10-13 DIAGNOSIS — Z86711 Personal history of pulmonary embolism: Secondary | ICD-10-CM | POA: Diagnosis not present

## 2022-10-13 DIAGNOSIS — G9341 Metabolic encephalopathy: Secondary | ICD-10-CM | POA: Diagnosis present

## 2022-10-13 DIAGNOSIS — G2581 Restless legs syndrome: Secondary | ICD-10-CM | POA: Diagnosis not present

## 2022-10-13 DIAGNOSIS — F32A Depression, unspecified: Secondary | ICD-10-CM | POA: Diagnosis not present

## 2022-10-13 DIAGNOSIS — J181 Lobar pneumonia, unspecified organism: Secondary | ICD-10-CM | POA: Diagnosis not present

## 2022-10-13 DIAGNOSIS — M4644 Discitis, unspecified, thoracic region: Secondary | ICD-10-CM | POA: Diagnosis present

## 2022-10-13 DIAGNOSIS — R0902 Hypoxemia: Secondary | ICD-10-CM | POA: Diagnosis not present

## 2022-10-13 DIAGNOSIS — R4182 Altered mental status, unspecified: Secondary | ICD-10-CM | POA: Diagnosis not present

## 2022-10-13 DIAGNOSIS — M4626 Osteomyelitis of vertebra, lumbar region: Secondary | ICD-10-CM | POA: Diagnosis not present

## 2022-10-13 DIAGNOSIS — Z743 Need for continuous supervision: Secondary | ICD-10-CM | POA: Diagnosis not present

## 2022-10-13 DIAGNOSIS — G8929 Other chronic pain: Secondary | ICD-10-CM | POA: Diagnosis not present

## 2022-10-13 DIAGNOSIS — M5459 Other low back pain: Secondary | ICD-10-CM | POA: Diagnosis not present

## 2022-10-13 DIAGNOSIS — J189 Pneumonia, unspecified organism: Secondary | ICD-10-CM | POA: Diagnosis present

## 2022-10-13 DIAGNOSIS — D631 Anemia in chronic kidney disease: Secondary | ICD-10-CM | POA: Diagnosis not present

## 2022-10-13 DIAGNOSIS — R109 Unspecified abdominal pain: Secondary | ICD-10-CM | POA: Diagnosis not present

## 2022-10-13 DIAGNOSIS — N186 End stage renal disease: Secondary | ICD-10-CM | POA: Diagnosis present

## 2022-10-13 DIAGNOSIS — J9611 Chronic respiratory failure with hypoxia: Secondary | ICD-10-CM | POA: Diagnosis not present

## 2022-10-14 DIAGNOSIS — I132 Hypertensive heart and chronic kidney disease with heart failure and with stage 5 chronic kidney disease, or end stage renal disease: Secondary | ICD-10-CM | POA: Diagnosis not present

## 2022-10-14 DIAGNOSIS — I517 Cardiomegaly: Secondary | ICD-10-CM | POA: Diagnosis not present

## 2022-10-14 DIAGNOSIS — F32A Depression, unspecified: Secondary | ICD-10-CM | POA: Diagnosis not present

## 2022-10-14 DIAGNOSIS — M4626 Osteomyelitis of vertebra, lumbar region: Secondary | ICD-10-CM | POA: Diagnosis not present

## 2022-10-14 DIAGNOSIS — Z91199 Patient's noncompliance with other medical treatment and regimen due to unspecified reason: Secondary | ICD-10-CM | POA: Diagnosis not present

## 2022-10-14 DIAGNOSIS — M1A9XX0 Chronic gout, unspecified, without tophus (tophi): Secondary | ICD-10-CM | POA: Diagnosis not present

## 2022-10-14 DIAGNOSIS — M1990 Unspecified osteoarthritis, unspecified site: Secondary | ICD-10-CM | POA: Diagnosis not present

## 2022-10-14 DIAGNOSIS — J9611 Chronic respiratory failure with hypoxia: Secondary | ICD-10-CM | POA: Diagnosis not present

## 2022-10-14 DIAGNOSIS — Z96652 Presence of left artificial knee joint: Secondary | ICD-10-CM | POA: Diagnosis not present

## 2022-10-14 DIAGNOSIS — J189 Pneumonia, unspecified organism: Secondary | ICD-10-CM | POA: Diagnosis present

## 2022-10-14 DIAGNOSIS — R109 Unspecified abdominal pain: Secondary | ICD-10-CM | POA: Diagnosis not present

## 2022-10-14 DIAGNOSIS — I509 Heart failure, unspecified: Secondary | ICD-10-CM | POA: Diagnosis not present

## 2022-10-14 DIAGNOSIS — G2581 Restless legs syndrome: Secondary | ICD-10-CM | POA: Diagnosis not present

## 2022-10-14 DIAGNOSIS — G9341 Metabolic encephalopathy: Secondary | ICD-10-CM | POA: Diagnosis present

## 2022-10-14 DIAGNOSIS — M4624 Osteomyelitis of vertebra, thoracic region: Secondary | ICD-10-CM | POA: Diagnosis not present

## 2022-10-14 DIAGNOSIS — M546 Pain in thoracic spine: Secondary | ICD-10-CM | POA: Diagnosis not present

## 2022-10-14 DIAGNOSIS — N186 End stage renal disease: Secondary | ICD-10-CM | POA: Diagnosis not present

## 2022-10-14 DIAGNOSIS — M5459 Other low back pain: Secondary | ICD-10-CM | POA: Diagnosis not present

## 2022-10-14 DIAGNOSIS — E872 Acidosis, unspecified: Secondary | ICD-10-CM | POA: Diagnosis present

## 2022-10-14 DIAGNOSIS — A429 Actinomycosis, unspecified: Secondary | ICD-10-CM | POA: Diagnosis not present

## 2022-10-14 DIAGNOSIS — R7881 Bacteremia: Secondary | ICD-10-CM | POA: Diagnosis not present

## 2022-10-14 DIAGNOSIS — R918 Other nonspecific abnormal finding of lung field: Secondary | ICD-10-CM | POA: Diagnosis not present

## 2022-10-14 DIAGNOSIS — D631 Anemia in chronic kidney disease: Secondary | ICD-10-CM | POA: Diagnosis not present

## 2022-10-14 DIAGNOSIS — Z992 Dependence on renal dialysis: Secondary | ICD-10-CM | POA: Diagnosis not present

## 2022-10-14 DIAGNOSIS — G8929 Other chronic pain: Secondary | ICD-10-CM | POA: Diagnosis not present

## 2022-10-14 DIAGNOSIS — R1010 Upper abdominal pain, unspecified: Secondary | ICD-10-CM | POA: Diagnosis not present

## 2022-10-14 DIAGNOSIS — J181 Lobar pneumonia, unspecified organism: Secondary | ICD-10-CM | POA: Diagnosis not present

## 2022-10-14 DIAGNOSIS — Z91158 Patient's noncompliance with renal dialysis for other reason: Secondary | ICD-10-CM | POA: Diagnosis not present

## 2022-10-14 DIAGNOSIS — Z743 Need for continuous supervision: Secondary | ICD-10-CM | POA: Diagnosis not present

## 2022-10-14 DIAGNOSIS — J9621 Acute and chronic respiratory failure with hypoxia: Secondary | ICD-10-CM | POA: Diagnosis present

## 2022-10-14 DIAGNOSIS — K5903 Drug induced constipation: Secondary | ICD-10-CM | POA: Diagnosis not present

## 2022-10-14 DIAGNOSIS — I5033 Acute on chronic diastolic (congestive) heart failure: Secondary | ICD-10-CM | POA: Diagnosis present

## 2022-10-14 DIAGNOSIS — R4182 Altered mental status, unspecified: Secondary | ICD-10-CM | POA: Diagnosis not present

## 2022-10-14 DIAGNOSIS — I5032 Chronic diastolic (congestive) heart failure: Secondary | ICD-10-CM | POA: Diagnosis not present

## 2022-10-14 DIAGNOSIS — R0902 Hypoxemia: Secondary | ICD-10-CM | POA: Diagnosis not present

## 2022-10-14 DIAGNOSIS — Z6841 Body Mass Index (BMI) 40.0 and over, adult: Secondary | ICD-10-CM | POA: Diagnosis not present

## 2022-10-14 DIAGNOSIS — K219 Gastro-esophageal reflux disease without esophagitis: Secondary | ICD-10-CM | POA: Diagnosis not present

## 2022-10-14 DIAGNOSIS — I10 Essential (primary) hypertension: Secondary | ICD-10-CM | POA: Diagnosis not present

## 2022-10-14 DIAGNOSIS — J811 Chronic pulmonary edema: Secondary | ICD-10-CM | POA: Diagnosis not present

## 2022-10-14 DIAGNOSIS — M47816 Spondylosis without myelopathy or radiculopathy, lumbar region: Secondary | ICD-10-CM | POA: Diagnosis not present

## 2022-10-14 DIAGNOSIS — I120 Hypertensive chronic kidney disease with stage 5 chronic kidney disease or end stage renal disease: Secondary | ICD-10-CM | POA: Diagnosis not present

## 2022-10-14 DIAGNOSIS — Z9981 Dependence on supplemental oxygen: Secondary | ICD-10-CM | POA: Diagnosis not present

## 2022-10-14 DIAGNOSIS — E875 Hyperkalemia: Secondary | ICD-10-CM | POA: Diagnosis not present

## 2022-10-14 DIAGNOSIS — M4644 Discitis, unspecified, thoracic region: Secondary | ICD-10-CM | POA: Diagnosis not present

## 2022-10-14 DIAGNOSIS — M549 Dorsalgia, unspecified: Secondary | ICD-10-CM | POA: Diagnosis not present

## 2022-10-14 DIAGNOSIS — Z86711 Personal history of pulmonary embolism: Secondary | ICD-10-CM | POA: Diagnosis not present

## 2022-10-15 DIAGNOSIS — N186 End stage renal disease: Secondary | ICD-10-CM | POA: Diagnosis not present

## 2022-10-15 DIAGNOSIS — Z992 Dependence on renal dialysis: Secondary | ICD-10-CM | POA: Diagnosis not present

## 2022-10-22 DIAGNOSIS — G8929 Other chronic pain: Secondary | ICD-10-CM | POA: Diagnosis present

## 2022-10-22 DIAGNOSIS — E785 Hyperlipidemia, unspecified: Secondary | ICD-10-CM | POA: Diagnosis present

## 2022-10-22 DIAGNOSIS — Z86711 Personal history of pulmonary embolism: Secondary | ICD-10-CM | POA: Diagnosis not present

## 2022-10-22 DIAGNOSIS — R918 Other nonspecific abnormal finding of lung field: Secondary | ICD-10-CM | POA: Diagnosis present

## 2022-10-22 DIAGNOSIS — Z981 Arthrodesis status: Secondary | ICD-10-CM | POA: Diagnosis not present

## 2022-10-22 DIAGNOSIS — D509 Iron deficiency anemia, unspecified: Secondary | ICD-10-CM | POA: Diagnosis not present

## 2022-10-22 DIAGNOSIS — K5903 Drug induced constipation: Secondary | ICD-10-CM | POA: Diagnosis not present

## 2022-10-22 DIAGNOSIS — I132 Hypertensive heart and chronic kidney disease with heart failure and with stage 5 chronic kidney disease, or end stage renal disease: Secondary | ICD-10-CM | POA: Diagnosis not present

## 2022-10-22 DIAGNOSIS — M1990 Unspecified osteoarthritis, unspecified site: Secondary | ICD-10-CM | POA: Diagnosis not present

## 2022-10-22 DIAGNOSIS — Z743 Need for continuous supervision: Secondary | ICD-10-CM | POA: Diagnosis not present

## 2022-10-22 DIAGNOSIS — I5032 Chronic diastolic (congestive) heart failure: Secondary | ICD-10-CM | POA: Diagnosis not present

## 2022-10-22 DIAGNOSIS — Z992 Dependence on renal dialysis: Secondary | ICD-10-CM | POA: Diagnosis not present

## 2022-10-22 DIAGNOSIS — M4802 Spinal stenosis, cervical region: Secondary | ICD-10-CM | POA: Diagnosis present

## 2022-10-22 DIAGNOSIS — I10 Essential (primary) hypertension: Secondary | ICD-10-CM | POA: Diagnosis not present

## 2022-10-22 DIAGNOSIS — J9611 Chronic respiratory failure with hypoxia: Secondary | ICD-10-CM | POA: Diagnosis not present

## 2022-10-22 DIAGNOSIS — M5459 Other low back pain: Secondary | ICD-10-CM | POA: Diagnosis not present

## 2022-10-22 DIAGNOSIS — K219 Gastro-esophageal reflux disease without esophagitis: Secondary | ICD-10-CM | POA: Diagnosis present

## 2022-10-22 DIAGNOSIS — N250 Renal osteodystrophy: Secondary | ICD-10-CM | POA: Diagnosis not present

## 2022-10-22 DIAGNOSIS — M1A9XX0 Chronic gout, unspecified, without tophus (tophi): Secondary | ICD-10-CM | POA: Diagnosis not present

## 2022-10-22 DIAGNOSIS — M5127 Other intervertebral disc displacement, lumbosacral region: Secondary | ICD-10-CM | POA: Diagnosis not present

## 2022-10-22 DIAGNOSIS — I120 Hypertensive chronic kidney disease with stage 5 chronic kidney disease or end stage renal disease: Secondary | ICD-10-CM | POA: Diagnosis present

## 2022-10-22 DIAGNOSIS — F411 Generalized anxiety disorder: Secondary | ICD-10-CM | POA: Diagnosis not present

## 2022-10-22 DIAGNOSIS — M4644 Discitis, unspecified, thoracic region: Secondary | ICD-10-CM | POA: Diagnosis not present

## 2022-10-22 DIAGNOSIS — M545 Low back pain, unspecified: Secondary | ICD-10-CM | POA: Diagnosis not present

## 2022-10-22 DIAGNOSIS — F32A Depression, unspecified: Secondary | ICD-10-CM | POA: Diagnosis not present

## 2022-10-22 DIAGNOSIS — G894 Chronic pain syndrome: Secondary | ICD-10-CM | POA: Diagnosis not present

## 2022-10-22 DIAGNOSIS — M4624 Osteomyelitis of vertebra, thoracic region: Secondary | ICD-10-CM | POA: Diagnosis not present

## 2022-10-22 DIAGNOSIS — J181 Lobar pneumonia, unspecified organism: Secondary | ICD-10-CM | POA: Diagnosis not present

## 2022-10-22 DIAGNOSIS — R7881 Bacteremia: Secondary | ICD-10-CM | POA: Diagnosis not present

## 2022-10-22 DIAGNOSIS — Z9981 Dependence on supplemental oxygen: Secondary | ICD-10-CM | POA: Diagnosis not present

## 2022-10-22 DIAGNOSIS — Z1389 Encounter for screening for other disorder: Secondary | ICD-10-CM | POA: Diagnosis not present

## 2022-10-22 DIAGNOSIS — R1010 Upper abdominal pain, unspecified: Secondary | ICD-10-CM | POA: Diagnosis not present

## 2022-10-22 DIAGNOSIS — Z792 Long term (current) use of antibiotics: Secondary | ICD-10-CM | POA: Diagnosis not present

## 2022-10-22 DIAGNOSIS — Z79899 Other long term (current) drug therapy: Secondary | ICD-10-CM | POA: Diagnosis not present

## 2022-10-22 DIAGNOSIS — A429 Actinomycosis, unspecified: Secondary | ICD-10-CM | POA: Diagnosis not present

## 2022-10-22 DIAGNOSIS — F419 Anxiety disorder, unspecified: Secondary | ICD-10-CM | POA: Diagnosis present

## 2022-10-22 DIAGNOSIS — E875 Hyperkalemia: Secondary | ICD-10-CM | POA: Diagnosis present

## 2022-10-22 DIAGNOSIS — M109 Gout, unspecified: Secondary | ICD-10-CM | POA: Diagnosis present

## 2022-10-22 DIAGNOSIS — Z96652 Presence of left artificial knee joint: Secondary | ICD-10-CM | POA: Diagnosis not present

## 2022-10-22 DIAGNOSIS — D631 Anemia in chronic kidney disease: Secondary | ICD-10-CM | POA: Diagnosis not present

## 2022-10-22 DIAGNOSIS — N186 End stage renal disease: Secondary | ICD-10-CM | POA: Diagnosis not present

## 2022-10-22 DIAGNOSIS — M546 Pain in thoracic spine: Secondary | ICD-10-CM | POA: Diagnosis not present

## 2022-10-22 DIAGNOSIS — J9610 Chronic respiratory failure, unspecified whether with hypoxia or hypercapnia: Secondary | ICD-10-CM | POA: Diagnosis not present

## 2022-10-22 DIAGNOSIS — M549 Dorsalgia, unspecified: Secondary | ICD-10-CM | POA: Diagnosis not present

## 2022-10-22 DIAGNOSIS — G2581 Restless legs syndrome: Secondary | ICD-10-CM | POA: Diagnosis not present

## 2022-10-23 DIAGNOSIS — N186 End stage renal disease: Secondary | ICD-10-CM | POA: Diagnosis not present

## 2022-10-23 DIAGNOSIS — N250 Renal osteodystrophy: Secondary | ICD-10-CM | POA: Diagnosis not present

## 2022-10-23 DIAGNOSIS — D631 Anemia in chronic kidney disease: Secondary | ICD-10-CM | POA: Diagnosis not present

## 2022-10-23 DIAGNOSIS — Z992 Dependence on renal dialysis: Secondary | ICD-10-CM | POA: Diagnosis not present

## 2022-10-25 DIAGNOSIS — Z992 Dependence on renal dialysis: Secondary | ICD-10-CM | POA: Diagnosis not present

## 2022-10-25 DIAGNOSIS — N250 Renal osteodystrophy: Secondary | ICD-10-CM | POA: Diagnosis not present

## 2022-10-25 DIAGNOSIS — D631 Anemia in chronic kidney disease: Secondary | ICD-10-CM | POA: Diagnosis not present

## 2022-10-25 DIAGNOSIS — N186 End stage renal disease: Secondary | ICD-10-CM | POA: Diagnosis not present

## 2022-10-27 DIAGNOSIS — M4624 Osteomyelitis of vertebra, thoracic region: Secondary | ICD-10-CM | POA: Diagnosis not present

## 2022-10-27 DIAGNOSIS — F32A Depression, unspecified: Secondary | ICD-10-CM | POA: Diagnosis not present

## 2022-10-27 DIAGNOSIS — M1A9XX0 Chronic gout, unspecified, without tophus (tophi): Secondary | ICD-10-CM | POA: Diagnosis not present

## 2022-10-27 DIAGNOSIS — D631 Anemia in chronic kidney disease: Secondary | ICD-10-CM | POA: Diagnosis not present

## 2022-10-27 DIAGNOSIS — I132 Hypertensive heart and chronic kidney disease with heart failure and with stage 5 chronic kidney disease, or end stage renal disease: Secondary | ICD-10-CM | POA: Diagnosis not present

## 2022-10-27 DIAGNOSIS — G2581 Restless legs syndrome: Secondary | ICD-10-CM | POA: Diagnosis not present

## 2022-10-27 DIAGNOSIS — I5032 Chronic diastolic (congestive) heart failure: Secondary | ICD-10-CM | POA: Diagnosis not present

## 2022-10-28 DIAGNOSIS — Z992 Dependence on renal dialysis: Secondary | ICD-10-CM | POA: Diagnosis not present

## 2022-10-28 DIAGNOSIS — F32A Depression, unspecified: Secondary | ICD-10-CM | POA: Diagnosis not present

## 2022-10-28 DIAGNOSIS — N186 End stage renal disease: Secondary | ICD-10-CM | POA: Diagnosis not present

## 2022-10-28 DIAGNOSIS — D631 Anemia in chronic kidney disease: Secondary | ICD-10-CM | POA: Diagnosis not present

## 2022-10-28 DIAGNOSIS — F411 Generalized anxiety disorder: Secondary | ICD-10-CM | POA: Diagnosis not present

## 2022-10-28 DIAGNOSIS — N250 Renal osteodystrophy: Secondary | ICD-10-CM | POA: Diagnosis not present

## 2022-10-29 DIAGNOSIS — J9610 Chronic respiratory failure, unspecified whether with hypoxia or hypercapnia: Secondary | ICD-10-CM | POA: Diagnosis not present

## 2022-10-29 DIAGNOSIS — N186 End stage renal disease: Secondary | ICD-10-CM | POA: Diagnosis not present

## 2022-10-29 DIAGNOSIS — I10 Essential (primary) hypertension: Secondary | ICD-10-CM | POA: Diagnosis not present

## 2022-10-30 DIAGNOSIS — D631 Anemia in chronic kidney disease: Secondary | ICD-10-CM | POA: Diagnosis not present

## 2022-10-30 DIAGNOSIS — N186 End stage renal disease: Secondary | ICD-10-CM | POA: Diagnosis not present

## 2022-10-30 DIAGNOSIS — D509 Iron deficiency anemia, unspecified: Secondary | ICD-10-CM | POA: Diagnosis not present

## 2022-10-30 DIAGNOSIS — Z992 Dependence on renal dialysis: Secondary | ICD-10-CM | POA: Diagnosis not present

## 2022-10-30 DIAGNOSIS — N250 Renal osteodystrophy: Secondary | ICD-10-CM | POA: Diagnosis not present

## 2022-11-01 DIAGNOSIS — N250 Renal osteodystrophy: Secondary | ICD-10-CM | POA: Diagnosis not present

## 2022-11-01 DIAGNOSIS — Z992 Dependence on renal dialysis: Secondary | ICD-10-CM | POA: Diagnosis not present

## 2022-11-01 DIAGNOSIS — N186 End stage renal disease: Secondary | ICD-10-CM | POA: Diagnosis not present

## 2022-11-01 DIAGNOSIS — D631 Anemia in chronic kidney disease: Secondary | ICD-10-CM | POA: Diagnosis not present

## 2022-11-01 DIAGNOSIS — D509 Iron deficiency anemia, unspecified: Secondary | ICD-10-CM | POA: Diagnosis not present

## 2022-11-04 DIAGNOSIS — F32A Depression, unspecified: Secondary | ICD-10-CM | POA: Diagnosis not present

## 2022-11-04 DIAGNOSIS — N250 Renal osteodystrophy: Secondary | ICD-10-CM | POA: Diagnosis not present

## 2022-11-04 DIAGNOSIS — Z992 Dependence on renal dialysis: Secondary | ICD-10-CM | POA: Diagnosis not present

## 2022-11-04 DIAGNOSIS — D509 Iron deficiency anemia, unspecified: Secondary | ICD-10-CM | POA: Diagnosis not present

## 2022-11-04 DIAGNOSIS — I132 Hypertensive heart and chronic kidney disease with heart failure and with stage 5 chronic kidney disease, or end stage renal disease: Secondary | ICD-10-CM | POA: Diagnosis not present

## 2022-11-04 DIAGNOSIS — M4624 Osteomyelitis of vertebra, thoracic region: Secondary | ICD-10-CM | POA: Diagnosis not present

## 2022-11-04 DIAGNOSIS — N186 End stage renal disease: Secondary | ICD-10-CM | POA: Diagnosis not present

## 2022-11-04 DIAGNOSIS — I5032 Chronic diastolic (congestive) heart failure: Secondary | ICD-10-CM | POA: Diagnosis not present

## 2022-11-04 DIAGNOSIS — G894 Chronic pain syndrome: Secondary | ICD-10-CM | POA: Diagnosis not present

## 2022-11-04 DIAGNOSIS — D631 Anemia in chronic kidney disease: Secondary | ICD-10-CM | POA: Diagnosis not present

## 2022-11-06 DIAGNOSIS — M4802 Spinal stenosis, cervical region: Secondary | ICD-10-CM | POA: Diagnosis present

## 2022-11-06 DIAGNOSIS — Z6841 Body Mass Index (BMI) 40.0 and over, adult: Secondary | ICD-10-CM | POA: Diagnosis not present

## 2022-11-06 DIAGNOSIS — Z8674 Personal history of sudden cardiac arrest: Secondary | ICD-10-CM | POA: Diagnosis not present

## 2022-11-06 DIAGNOSIS — J9602 Acute respiratory failure with hypercapnia: Secondary | ICD-10-CM | POA: Diagnosis not present

## 2022-11-06 DIAGNOSIS — Z8249 Family history of ischemic heart disease and other diseases of the circulatory system: Secondary | ICD-10-CM | POA: Diagnosis not present

## 2022-11-06 DIAGNOSIS — Z792 Long term (current) use of antibiotics: Secondary | ICD-10-CM | POA: Diagnosis not present

## 2022-11-06 DIAGNOSIS — E785 Hyperlipidemia, unspecified: Secondary | ICD-10-CM | POA: Diagnosis present

## 2022-11-06 DIAGNOSIS — Z83438 Family history of other disorder of lipoprotein metabolism and other lipidemia: Secondary | ICD-10-CM | POA: Diagnosis not present

## 2022-11-06 DIAGNOSIS — G9341 Metabolic encephalopathy: Secondary | ICD-10-CM | POA: Diagnosis not present

## 2022-11-06 DIAGNOSIS — M545 Low back pain, unspecified: Secondary | ICD-10-CM | POA: Diagnosis not present

## 2022-11-06 DIAGNOSIS — G2581 Restless legs syndrome: Secondary | ICD-10-CM | POA: Diagnosis present

## 2022-11-06 DIAGNOSIS — G894 Chronic pain syndrome: Secondary | ICD-10-CM | POA: Diagnosis not present

## 2022-11-06 DIAGNOSIS — I129 Hypertensive chronic kidney disease with stage 1 through stage 4 chronic kidney disease, or unspecified chronic kidney disease: Secondary | ICD-10-CM | POA: Diagnosis not present

## 2022-11-06 DIAGNOSIS — K219 Gastro-esophageal reflux disease without esophagitis: Secondary | ICD-10-CM | POA: Diagnosis not present

## 2022-11-06 DIAGNOSIS — N186 End stage renal disease: Secondary | ICD-10-CM | POA: Diagnosis present

## 2022-11-06 DIAGNOSIS — M5031 Other cervical disc degeneration,  high cervical region: Secondary | ICD-10-CM | POA: Diagnosis not present

## 2022-11-06 DIAGNOSIS — G9340 Encephalopathy, unspecified: Secondary | ICD-10-CM | POA: Diagnosis not present

## 2022-11-06 DIAGNOSIS — M21371 Foot drop, right foot: Secondary | ICD-10-CM | POA: Diagnosis not present

## 2022-11-06 DIAGNOSIS — M4312 Spondylolisthesis, cervical region: Secondary | ICD-10-CM | POA: Diagnosis not present

## 2022-11-06 DIAGNOSIS — E8770 Fluid overload, unspecified: Secondary | ICD-10-CM | POA: Diagnosis not present

## 2022-11-06 DIAGNOSIS — Z981 Arthrodesis status: Secondary | ICD-10-CM | POA: Diagnosis not present

## 2022-11-06 DIAGNOSIS — R918 Other nonspecific abnormal finding of lung field: Secondary | ICD-10-CM | POA: Diagnosis not present

## 2022-11-06 DIAGNOSIS — Z828 Family history of other disabilities and chronic diseases leading to disablement, not elsewhere classified: Secondary | ICD-10-CM | POA: Diagnosis not present

## 2022-11-06 DIAGNOSIS — R079 Chest pain, unspecified: Secondary | ICD-10-CM | POA: Diagnosis not present

## 2022-11-06 DIAGNOSIS — Z86711 Personal history of pulmonary embolism: Secondary | ICD-10-CM | POA: Diagnosis not present

## 2022-11-06 DIAGNOSIS — D509 Iron deficiency anemia, unspecified: Secondary | ICD-10-CM | POA: Diagnosis not present

## 2022-11-06 DIAGNOSIS — A420 Pulmonary actinomycosis: Secondary | ICD-10-CM | POA: Diagnosis not present

## 2022-11-06 DIAGNOSIS — M549 Dorsalgia, unspecified: Secondary | ICD-10-CM | POA: Diagnosis not present

## 2022-11-06 DIAGNOSIS — M546 Pain in thoracic spine: Secondary | ICD-10-CM | POA: Diagnosis not present

## 2022-11-06 DIAGNOSIS — R52 Pain, unspecified: Secondary | ICD-10-CM | POA: Diagnosis not present

## 2022-11-06 DIAGNOSIS — G8929 Other chronic pain: Secondary | ICD-10-CM | POA: Diagnosis not present

## 2022-11-06 DIAGNOSIS — M109 Gout, unspecified: Secondary | ICD-10-CM | POA: Diagnosis present

## 2022-11-06 DIAGNOSIS — Z992 Dependence on renal dialysis: Secondary | ICD-10-CM | POA: Diagnosis not present

## 2022-11-06 DIAGNOSIS — N250 Renal osteodystrophy: Secondary | ICD-10-CM | POA: Diagnosis not present

## 2022-11-06 DIAGNOSIS — M79604 Pain in right leg: Secondary | ICD-10-CM | POA: Diagnosis not present

## 2022-11-06 DIAGNOSIS — R200 Anesthesia of skin: Secondary | ICD-10-CM | POA: Diagnosis not present

## 2022-11-06 DIAGNOSIS — E876 Hypokalemia: Secondary | ICD-10-CM | POA: Diagnosis not present

## 2022-11-06 DIAGNOSIS — Z8619 Personal history of other infectious and parasitic diseases: Secondary | ICD-10-CM | POA: Diagnosis not present

## 2022-11-06 DIAGNOSIS — G4700 Insomnia, unspecified: Secondary | ICD-10-CM | POA: Diagnosis not present

## 2022-11-06 DIAGNOSIS — E662 Morbid (severe) obesity with alveolar hypoventilation: Secondary | ICD-10-CM | POA: Diagnosis not present

## 2022-11-06 DIAGNOSIS — E872 Acidosis, unspecified: Secondary | ICD-10-CM | POA: Diagnosis not present

## 2022-11-06 DIAGNOSIS — M4324 Fusion of spine, thoracic region: Secondary | ICD-10-CM | POA: Diagnosis not present

## 2022-11-06 DIAGNOSIS — M5137 Other intervertebral disc degeneration, lumbosacral region: Secondary | ICD-10-CM | POA: Diagnosis not present

## 2022-11-06 DIAGNOSIS — I120 Hypertensive chronic kidney disease with stage 5 chronic kidney disease or end stage renal disease: Secondary | ICD-10-CM | POA: Diagnosis present

## 2022-11-06 DIAGNOSIS — M4803 Spinal stenosis, cervicothoracic region: Secondary | ICD-10-CM | POA: Diagnosis not present

## 2022-11-06 DIAGNOSIS — M62838 Other muscle spasm: Secondary | ICD-10-CM | POA: Diagnosis not present

## 2022-11-06 DIAGNOSIS — Z79899 Other long term (current) drug therapy: Secondary | ICD-10-CM | POA: Diagnosis not present

## 2022-11-06 DIAGNOSIS — M542 Cervicalgia: Secondary | ICD-10-CM | POA: Diagnosis not present

## 2022-11-06 DIAGNOSIS — D631 Anemia in chronic kidney disease: Secondary | ICD-10-CM | POA: Diagnosis present

## 2022-11-06 DIAGNOSIS — Z1389 Encounter for screening for other disorder: Secondary | ICD-10-CM | POA: Diagnosis not present

## 2022-11-06 DIAGNOSIS — M5127 Other intervertebral disc displacement, lumbosacral region: Secondary | ICD-10-CM | POA: Diagnosis not present

## 2022-11-06 DIAGNOSIS — F419 Anxiety disorder, unspecified: Secondary | ICD-10-CM | POA: Diagnosis present

## 2022-11-06 DIAGNOSIS — M5459 Other low back pain: Secondary | ICD-10-CM | POA: Diagnosis not present

## 2022-11-06 DIAGNOSIS — R0602 Shortness of breath: Secondary | ICD-10-CM | POA: Diagnosis not present

## 2022-11-06 DIAGNOSIS — I10 Essential (primary) hypertension: Secondary | ICD-10-CM | POA: Diagnosis not present

## 2022-11-06 DIAGNOSIS — E871 Hypo-osmolality and hyponatremia: Secondary | ICD-10-CM | POA: Diagnosis not present

## 2022-11-06 DIAGNOSIS — E038 Other specified hypothyroidism: Secondary | ICD-10-CM | POA: Diagnosis not present

## 2022-11-06 DIAGNOSIS — M4644 Discitis, unspecified, thoracic region: Secondary | ICD-10-CM | POA: Diagnosis not present

## 2022-11-06 DIAGNOSIS — E875 Hyperkalemia: Secondary | ICD-10-CM | POA: Diagnosis present

## 2022-11-06 DIAGNOSIS — J9621 Acute and chronic respiratory failure with hypoxia: Secondary | ICD-10-CM | POA: Diagnosis not present

## 2022-11-07 ENCOUNTER — Telehealth: Payer: Self-pay

## 2022-11-07 DIAGNOSIS — M542 Cervicalgia: Secondary | ICD-10-CM | POA: Diagnosis not present

## 2022-11-07 DIAGNOSIS — J9602 Acute respiratory failure with hypercapnia: Secondary | ICD-10-CM | POA: Diagnosis not present

## 2022-11-07 DIAGNOSIS — E876 Hypokalemia: Secondary | ICD-10-CM | POA: Diagnosis not present

## 2022-11-07 DIAGNOSIS — M4802 Spinal stenosis, cervical region: Secondary | ICD-10-CM | POA: Diagnosis present

## 2022-11-07 DIAGNOSIS — G8929 Other chronic pain: Secondary | ICD-10-CM | POA: Diagnosis present

## 2022-11-07 DIAGNOSIS — I129 Hypertensive chronic kidney disease with stage 1 through stage 4 chronic kidney disease, or unspecified chronic kidney disease: Secondary | ICD-10-CM | POA: Diagnosis not present

## 2022-11-07 DIAGNOSIS — M62838 Other muscle spasm: Secondary | ICD-10-CM | POA: Diagnosis not present

## 2022-11-07 DIAGNOSIS — G9340 Encephalopathy, unspecified: Secondary | ICD-10-CM | POA: Diagnosis not present

## 2022-11-07 DIAGNOSIS — Z992 Dependence on renal dialysis: Secondary | ICD-10-CM | POA: Diagnosis not present

## 2022-11-07 DIAGNOSIS — Z8619 Personal history of other infectious and parasitic diseases: Secondary | ICD-10-CM | POA: Diagnosis not present

## 2022-11-07 DIAGNOSIS — R079 Chest pain, unspecified: Secondary | ICD-10-CM | POA: Diagnosis not present

## 2022-11-07 DIAGNOSIS — M4312 Spondylolisthesis, cervical region: Secondary | ICD-10-CM | POA: Diagnosis not present

## 2022-11-07 DIAGNOSIS — E8770 Fluid overload, unspecified: Secondary | ICD-10-CM | POA: Diagnosis not present

## 2022-11-07 DIAGNOSIS — M109 Gout, unspecified: Secondary | ICD-10-CM | POA: Diagnosis present

## 2022-11-07 DIAGNOSIS — M4324 Fusion of spine, thoracic region: Secondary | ICD-10-CM | POA: Diagnosis not present

## 2022-11-07 DIAGNOSIS — M79604 Pain in right leg: Secondary | ICD-10-CM | POA: Diagnosis not present

## 2022-11-07 DIAGNOSIS — M5459 Other low back pain: Secondary | ICD-10-CM | POA: Diagnosis not present

## 2022-11-07 DIAGNOSIS — K219 Gastro-esophageal reflux disease without esophagitis: Secondary | ICD-10-CM | POA: Diagnosis present

## 2022-11-07 DIAGNOSIS — N186 End stage renal disease: Secondary | ICD-10-CM | POA: Diagnosis present

## 2022-11-07 DIAGNOSIS — D631 Anemia in chronic kidney disease: Secondary | ICD-10-CM | POA: Diagnosis present

## 2022-11-07 DIAGNOSIS — M4644 Discitis, unspecified, thoracic region: Secondary | ICD-10-CM | POA: Diagnosis not present

## 2022-11-07 DIAGNOSIS — G4700 Insomnia, unspecified: Secondary | ICD-10-CM | POA: Diagnosis not present

## 2022-11-07 DIAGNOSIS — R918 Other nonspecific abnormal finding of lung field: Secondary | ICD-10-CM | POA: Diagnosis present

## 2022-11-07 DIAGNOSIS — M549 Dorsalgia, unspecified: Secondary | ICD-10-CM | POA: Diagnosis not present

## 2022-11-07 DIAGNOSIS — R52 Pain, unspecified: Secondary | ICD-10-CM | POA: Diagnosis not present

## 2022-11-07 DIAGNOSIS — E875 Hyperkalemia: Secondary | ICD-10-CM | POA: Diagnosis present

## 2022-11-07 DIAGNOSIS — E785 Hyperlipidemia, unspecified: Secondary | ICD-10-CM | POA: Diagnosis present

## 2022-11-07 DIAGNOSIS — R200 Anesthesia of skin: Secondary | ICD-10-CM | POA: Diagnosis not present

## 2022-11-07 DIAGNOSIS — E038 Other specified hypothyroidism: Secondary | ICD-10-CM | POA: Diagnosis not present

## 2022-11-07 DIAGNOSIS — G2581 Restless legs syndrome: Secondary | ICD-10-CM | POA: Diagnosis present

## 2022-11-07 DIAGNOSIS — Z981 Arthrodesis status: Secondary | ICD-10-CM | POA: Diagnosis not present

## 2022-11-07 DIAGNOSIS — I120 Hypertensive chronic kidney disease with stage 5 chronic kidney disease or end stage renal disease: Secondary | ICD-10-CM | POA: Diagnosis present

## 2022-11-07 DIAGNOSIS — Z79899 Other long term (current) drug therapy: Secondary | ICD-10-CM | POA: Diagnosis not present

## 2022-11-07 DIAGNOSIS — M546 Pain in thoracic spine: Secondary | ICD-10-CM | POA: Diagnosis not present

## 2022-11-07 DIAGNOSIS — F419 Anxiety disorder, unspecified: Secondary | ICD-10-CM | POA: Diagnosis present

## 2022-11-07 DIAGNOSIS — M4803 Spinal stenosis, cervicothoracic region: Secondary | ICD-10-CM | POA: Diagnosis not present

## 2022-11-07 DIAGNOSIS — A420 Pulmonary actinomycosis: Secondary | ICD-10-CM | POA: Diagnosis not present

## 2022-11-07 DIAGNOSIS — Z792 Long term (current) use of antibiotics: Secondary | ICD-10-CM | POA: Diagnosis not present

## 2022-11-07 DIAGNOSIS — M545 Low back pain, unspecified: Secondary | ICD-10-CM | POA: Diagnosis not present

## 2022-11-07 DIAGNOSIS — I10 Essential (primary) hypertension: Secondary | ICD-10-CM | POA: Diagnosis not present

## 2022-11-07 DIAGNOSIS — E871 Hypo-osmolality and hyponatremia: Secondary | ICD-10-CM | POA: Diagnosis not present

## 2022-11-07 DIAGNOSIS — R0602 Shortness of breath: Secondary | ICD-10-CM | POA: Diagnosis not present

## 2022-11-07 NOTE — Patient Outreach (Signed)
Received a referral from Baruch Gouty for home care needs for Ms. Christine Horn.  [facility] arranges for this service, Ms. Callihan is needing to have an office visit with them, she has missed the last 3.     Iverson Alamin, Donivan Scull [facility] Care Management Assistant [facility] Care Management [phone]

## 2022-11-11 DIAGNOSIS — Z7401 Bed confinement status: Secondary | ICD-10-CM | POA: Diagnosis not present

## 2022-11-11 DIAGNOSIS — E872 Acidosis, unspecified: Secondary | ICD-10-CM | POA: Diagnosis not present

## 2022-11-11 DIAGNOSIS — E871 Hypo-osmolality and hyponatremia: Secondary | ICD-10-CM | POA: Diagnosis not present

## 2022-11-11 DIAGNOSIS — M5031 Other cervical disc degeneration,  high cervical region: Secondary | ICD-10-CM | POA: Diagnosis not present

## 2022-11-11 DIAGNOSIS — Z992 Dependence on renal dialysis: Secondary | ICD-10-CM | POA: Diagnosis not present

## 2022-11-11 DIAGNOSIS — I10 Essential (primary) hypertension: Secondary | ICD-10-CM | POA: Diagnosis not present

## 2022-11-11 DIAGNOSIS — J9621 Acute and chronic respiratory failure with hypoxia: Secondary | ICD-10-CM | POA: Diagnosis not present

## 2022-11-11 DIAGNOSIS — G2581 Restless legs syndrome: Secondary | ICD-10-CM | POA: Diagnosis not present

## 2022-11-11 DIAGNOSIS — D631 Anemia in chronic kidney disease: Secondary | ICD-10-CM | POA: Diagnosis not present

## 2022-11-11 DIAGNOSIS — A429 Actinomycosis, unspecified: Secondary | ICD-10-CM | POA: Diagnosis not present

## 2022-11-11 DIAGNOSIS — M4800 Spinal stenosis, site unspecified: Secondary | ICD-10-CM | POA: Diagnosis not present

## 2022-11-11 DIAGNOSIS — G9341 Metabolic encephalopathy: Secondary | ICD-10-CM | POA: Diagnosis not present

## 2022-11-11 DIAGNOSIS — J9602 Acute respiratory failure with hypercapnia: Secondary | ICD-10-CM | POA: Diagnosis not present

## 2022-11-11 DIAGNOSIS — I272 Pulmonary hypertension, unspecified: Secondary | ICD-10-CM | POA: Diagnosis not present

## 2022-11-11 DIAGNOSIS — R937 Abnormal findings on diagnostic imaging of other parts of musculoskeletal system: Secondary | ICD-10-CM | POA: Diagnosis not present

## 2022-11-11 DIAGNOSIS — Z86711 Personal history of pulmonary embolism: Secondary | ICD-10-CM | POA: Diagnosis not present

## 2022-11-11 DIAGNOSIS — M21371 Foot drop, right foot: Secondary | ICD-10-CM | POA: Diagnosis not present

## 2022-11-11 DIAGNOSIS — R4182 Altered mental status, unspecified: Secondary | ICD-10-CM | POA: Diagnosis not present

## 2022-11-11 DIAGNOSIS — E8770 Fluid overload, unspecified: Secondary | ICD-10-CM | POA: Diagnosis not present

## 2022-11-11 DIAGNOSIS — Z9889 Other specified postprocedural states: Secondary | ICD-10-CM | POA: Diagnosis not present

## 2022-11-11 DIAGNOSIS — Z79899 Other long term (current) drug therapy: Secondary | ICD-10-CM | POA: Diagnosis not present

## 2022-11-11 DIAGNOSIS — R0902 Hypoxemia: Secondary | ICD-10-CM | POA: Diagnosis not present

## 2022-11-11 DIAGNOSIS — K219 Gastro-esophageal reflux disease without esophagitis: Secondary | ICD-10-CM | POA: Diagnosis not present

## 2022-11-11 DIAGNOSIS — M5137 Other intervertebral disc degeneration, lumbosacral region: Secondary | ICD-10-CM | POA: Diagnosis not present

## 2022-11-11 DIAGNOSIS — Z6841 Body Mass Index (BMI) 40.0 and over, adult: Secondary | ICD-10-CM | POA: Diagnosis not present

## 2022-11-11 DIAGNOSIS — M109 Gout, unspecified: Secondary | ICD-10-CM | POA: Diagnosis not present

## 2022-11-11 DIAGNOSIS — N186 End stage renal disease: Secondary | ICD-10-CM | POA: Diagnosis not present

## 2022-11-11 DIAGNOSIS — Z8249 Family history of ischemic heart disease and other diseases of the circulatory system: Secondary | ICD-10-CM | POA: Diagnosis not present

## 2022-11-11 DIAGNOSIS — I120 Hypertensive chronic kidney disease with stage 5 chronic kidney disease or end stage renal disease: Secondary | ICD-10-CM | POA: Diagnosis not present

## 2022-11-11 DIAGNOSIS — E038 Other specified hypothyroidism: Secondary | ICD-10-CM | POA: Diagnosis not present

## 2022-11-11 DIAGNOSIS — G8929 Other chronic pain: Secondary | ICD-10-CM | POA: Diagnosis not present

## 2022-11-11 DIAGNOSIS — M4644 Discitis, unspecified, thoracic region: Secondary | ICD-10-CM | POA: Diagnosis not present

## 2022-11-11 DIAGNOSIS — E875 Hyperkalemia: Secondary | ICD-10-CM | POA: Diagnosis not present

## 2022-11-11 DIAGNOSIS — Z83438 Family history of other disorder of lipoprotein metabolism and other lipidemia: Secondary | ICD-10-CM | POA: Diagnosis not present

## 2022-11-11 DIAGNOSIS — G459 Transient cerebral ischemic attack, unspecified: Secondary | ICD-10-CM | POA: Diagnosis not present

## 2022-11-11 DIAGNOSIS — I081 Rheumatic disorders of both mitral and tricuspid valves: Secondary | ICD-10-CM | POA: Diagnosis not present

## 2022-11-11 DIAGNOSIS — Z8674 Personal history of sudden cardiac arrest: Secondary | ICD-10-CM | POA: Diagnosis not present

## 2022-11-11 DIAGNOSIS — Z981 Arthrodesis status: Secondary | ICD-10-CM | POA: Diagnosis not present

## 2022-11-11 DIAGNOSIS — E662 Morbid (severe) obesity with alveolar hypoventilation: Secondary | ICD-10-CM | POA: Diagnosis not present

## 2022-11-11 DIAGNOSIS — M4802 Spinal stenosis, cervical region: Secondary | ICD-10-CM | POA: Diagnosis not present

## 2022-11-11 DIAGNOSIS — G9340 Encephalopathy, unspecified: Secondary | ICD-10-CM | POA: Diagnosis not present

## 2022-11-11 DIAGNOSIS — E876 Hypokalemia: Secondary | ICD-10-CM | POA: Diagnosis not present

## 2022-11-11 DIAGNOSIS — Z828 Family history of other disabilities and chronic diseases leading to disablement, not elsewhere classified: Secondary | ICD-10-CM | POA: Diagnosis not present

## 2022-11-11 DIAGNOSIS — M549 Dorsalgia, unspecified: Secondary | ICD-10-CM | POA: Diagnosis not present

## 2022-11-12 DIAGNOSIS — Z992 Dependence on renal dialysis: Secondary | ICD-10-CM | POA: Diagnosis not present

## 2022-11-12 DIAGNOSIS — M4802 Spinal stenosis, cervical region: Secondary | ICD-10-CM | POA: Diagnosis not present

## 2022-11-12 DIAGNOSIS — E8770 Fluid overload, unspecified: Secondary | ICD-10-CM | POA: Diagnosis not present

## 2022-11-12 DIAGNOSIS — G459 Transient cerebral ischemic attack, unspecified: Secondary | ICD-10-CM | POA: Diagnosis not present

## 2022-11-12 DIAGNOSIS — M4644 Discitis, unspecified, thoracic region: Secondary | ICD-10-CM | POA: Diagnosis not present

## 2022-11-12 DIAGNOSIS — A429 Actinomycosis, unspecified: Secondary | ICD-10-CM | POA: Diagnosis not present

## 2022-11-12 DIAGNOSIS — N186 End stage renal disease: Secondary | ICD-10-CM | POA: Diagnosis not present

## 2022-11-12 DIAGNOSIS — Z9889 Other specified postprocedural states: Secondary | ICD-10-CM | POA: Diagnosis not present

## 2022-11-12 DIAGNOSIS — R937 Abnormal findings on diagnostic imaging of other parts of musculoskeletal system: Secondary | ICD-10-CM | POA: Diagnosis not present

## 2022-11-12 DIAGNOSIS — D631 Anemia in chronic kidney disease: Secondary | ICD-10-CM | POA: Diagnosis not present

## 2022-11-12 DIAGNOSIS — M4800 Spinal stenosis, site unspecified: Secondary | ICD-10-CM | POA: Diagnosis not present

## 2022-11-12 DIAGNOSIS — R4182 Altered mental status, unspecified: Secondary | ICD-10-CM | POA: Diagnosis not present

## 2022-11-12 DIAGNOSIS — I272 Pulmonary hypertension, unspecified: Secondary | ICD-10-CM | POA: Diagnosis not present

## 2022-11-12 DIAGNOSIS — I081 Rheumatic disorders of both mitral and tricuspid valves: Secondary | ICD-10-CM | POA: Diagnosis not present

## 2022-11-12 DIAGNOSIS — E038 Other specified hypothyroidism: Secondary | ICD-10-CM | POA: Diagnosis not present

## 2022-11-12 DIAGNOSIS — J9602 Acute respiratory failure with hypercapnia: Secondary | ICD-10-CM | POA: Diagnosis not present

## 2022-11-12 DIAGNOSIS — G9340 Encephalopathy, unspecified: Secondary | ICD-10-CM | POA: Diagnosis not present

## 2022-11-12 DIAGNOSIS — I120 Hypertensive chronic kidney disease with stage 5 chronic kidney disease or end stage renal disease: Secondary | ICD-10-CM | POA: Diagnosis not present

## 2022-11-12 DIAGNOSIS — E875 Hyperkalemia: Secondary | ICD-10-CM | POA: Diagnosis not present

## 2022-11-12 DIAGNOSIS — I10 Essential (primary) hypertension: Secondary | ICD-10-CM | POA: Diagnosis not present

## 2022-11-13 DIAGNOSIS — I120 Hypertensive chronic kidney disease with stage 5 chronic kidney disease or end stage renal disease: Secondary | ICD-10-CM | POA: Diagnosis not present

## 2022-11-13 DIAGNOSIS — N186 End stage renal disease: Secondary | ICD-10-CM | POA: Diagnosis not present

## 2022-11-13 DIAGNOSIS — E038 Other specified hypothyroidism: Secondary | ICD-10-CM | POA: Diagnosis not present

## 2022-11-13 DIAGNOSIS — E871 Hypo-osmolality and hyponatremia: Secondary | ICD-10-CM | POA: Diagnosis not present

## 2022-11-13 DIAGNOSIS — D631 Anemia in chronic kidney disease: Secondary | ICD-10-CM | POA: Diagnosis not present

## 2022-11-13 DIAGNOSIS — E8770 Fluid overload, unspecified: Secondary | ICD-10-CM | POA: Diagnosis not present

## 2022-11-13 DIAGNOSIS — Z981 Arthrodesis status: Secondary | ICD-10-CM | POA: Diagnosis not present

## 2022-11-13 DIAGNOSIS — M4802 Spinal stenosis, cervical region: Secondary | ICD-10-CM | POA: Diagnosis not present

## 2022-11-13 DIAGNOSIS — G459 Transient cerebral ischemic attack, unspecified: Secondary | ICD-10-CM | POA: Diagnosis not present

## 2022-11-13 DIAGNOSIS — M4644 Discitis, unspecified, thoracic region: Secondary | ICD-10-CM | POA: Diagnosis not present

## 2022-11-13 DIAGNOSIS — Z992 Dependence on renal dialysis: Secondary | ICD-10-CM | POA: Diagnosis not present

## 2022-11-14 DIAGNOSIS — D631 Anemia in chronic kidney disease: Secondary | ICD-10-CM | POA: Diagnosis not present

## 2022-11-14 DIAGNOSIS — E871 Hypo-osmolality and hyponatremia: Secondary | ICD-10-CM | POA: Diagnosis not present

## 2022-11-14 DIAGNOSIS — M4802 Spinal stenosis, cervical region: Secondary | ICD-10-CM | POA: Diagnosis not present

## 2022-11-14 DIAGNOSIS — N186 End stage renal disease: Secondary | ICD-10-CM | POA: Diagnosis not present

## 2022-11-14 DIAGNOSIS — I120 Hypertensive chronic kidney disease with stage 5 chronic kidney disease or end stage renal disease: Secondary | ICD-10-CM | POA: Diagnosis not present

## 2022-11-14 DIAGNOSIS — E038 Other specified hypothyroidism: Secondary | ICD-10-CM | POA: Diagnosis not present

## 2022-11-14 DIAGNOSIS — Z992 Dependence on renal dialysis: Secondary | ICD-10-CM | POA: Diagnosis not present

## 2022-11-15 DIAGNOSIS — M4802 Spinal stenosis, cervical region: Secondary | ICD-10-CM | POA: Diagnosis not present

## 2022-11-16 DIAGNOSIS — M4802 Spinal stenosis, cervical region: Secondary | ICD-10-CM | POA: Diagnosis not present

## 2022-11-17 DIAGNOSIS — M4802 Spinal stenosis, cervical region: Secondary | ICD-10-CM | POA: Diagnosis not present

## 2022-11-17 DIAGNOSIS — Z992 Dependence on renal dialysis: Secondary | ICD-10-CM | POA: Diagnosis not present

## 2022-11-17 DIAGNOSIS — E038 Other specified hypothyroidism: Secondary | ICD-10-CM | POA: Diagnosis not present

## 2022-11-17 DIAGNOSIS — N186 End stage renal disease: Secondary | ICD-10-CM | POA: Diagnosis not present

## 2022-11-17 DIAGNOSIS — E871 Hypo-osmolality and hyponatremia: Secondary | ICD-10-CM | POA: Diagnosis not present

## 2022-11-17 DIAGNOSIS — M4644 Discitis, unspecified, thoracic region: Secondary | ICD-10-CM | POA: Diagnosis not present

## 2022-11-17 DIAGNOSIS — I120 Hypertensive chronic kidney disease with stage 5 chronic kidney disease or end stage renal disease: Secondary | ICD-10-CM | POA: Diagnosis not present

## 2022-11-17 DIAGNOSIS — D631 Anemia in chronic kidney disease: Secondary | ICD-10-CM | POA: Diagnosis not present

## 2022-11-17 DIAGNOSIS — E876 Hypokalemia: Secondary | ICD-10-CM | POA: Diagnosis not present

## 2022-11-18 DIAGNOSIS — M4802 Spinal stenosis, cervical region: Secondary | ICD-10-CM | POA: Diagnosis not present

## 2022-11-18 DIAGNOSIS — G459 Transient cerebral ischemic attack, unspecified: Secondary | ICD-10-CM | POA: Diagnosis not present

## 2022-11-19 DIAGNOSIS — M4802 Spinal stenosis, cervical region: Secondary | ICD-10-CM | POA: Diagnosis not present

## 2022-11-19 DIAGNOSIS — G459 Transient cerebral ischemic attack, unspecified: Secondary | ICD-10-CM | POA: Diagnosis not present

## 2022-11-21 DIAGNOSIS — D631 Anemia in chronic kidney disease: Secondary | ICD-10-CM | POA: Diagnosis not present

## 2022-11-21 DIAGNOSIS — G8929 Other chronic pain: Secondary | ICD-10-CM | POA: Diagnosis not present

## 2022-11-21 DIAGNOSIS — A429 Actinomycosis, unspecified: Secondary | ICD-10-CM | POA: Diagnosis not present

## 2022-11-21 DIAGNOSIS — Z9049 Acquired absence of other specified parts of digestive tract: Secondary | ICD-10-CM | POA: Diagnosis not present

## 2022-11-21 DIAGNOSIS — Z9981 Dependence on supplemental oxygen: Secondary | ICD-10-CM | POA: Diagnosis not present

## 2022-11-21 DIAGNOSIS — I071 Rheumatic tricuspid insufficiency: Secondary | ICD-10-CM | POA: Diagnosis not present

## 2022-11-21 DIAGNOSIS — N250 Renal osteodystrophy: Secondary | ICD-10-CM | POA: Diagnosis not present

## 2022-11-21 DIAGNOSIS — Z86711 Personal history of pulmonary embolism: Secondary | ICD-10-CM | POA: Diagnosis not present

## 2022-11-21 DIAGNOSIS — M1A9XX0 Chronic gout, unspecified, without tophus (tophi): Secondary | ICD-10-CM | POA: Diagnosis not present

## 2022-11-21 DIAGNOSIS — I132 Hypertensive heart and chronic kidney disease with heart failure and with stage 5 chronic kidney disease, or end stage renal disease: Secondary | ICD-10-CM | POA: Diagnosis not present

## 2022-11-21 DIAGNOSIS — M1990 Unspecified osteoarthritis, unspecified site: Secondary | ICD-10-CM | POA: Diagnosis not present

## 2022-11-21 DIAGNOSIS — G2581 Restless legs syndrome: Secondary | ICD-10-CM | POA: Diagnosis not present

## 2022-11-21 DIAGNOSIS — M5136 Other intervertebral disc degeneration, lumbar region: Secondary | ICD-10-CM | POA: Diagnosis not present

## 2022-11-21 DIAGNOSIS — I5033 Acute on chronic diastolic (congestive) heart failure: Secondary | ICD-10-CM | POA: Diagnosis not present

## 2022-11-21 DIAGNOSIS — J9621 Acute and chronic respiratory failure with hypoxia: Secondary | ICD-10-CM | POA: Diagnosis not present

## 2022-11-21 DIAGNOSIS — K5900 Constipation, unspecified: Secondary | ICD-10-CM | POA: Diagnosis not present

## 2022-11-21 DIAGNOSIS — K219 Gastro-esophageal reflux disease without esophagitis: Secondary | ICD-10-CM | POA: Diagnosis not present

## 2022-11-21 DIAGNOSIS — J181 Lobar pneumonia, unspecified organism: Secondary | ICD-10-CM | POA: Diagnosis not present

## 2022-11-21 DIAGNOSIS — F32A Depression, unspecified: Secondary | ICD-10-CM | POA: Diagnosis not present

## 2022-11-21 DIAGNOSIS — S22009D Unspecified fracture of unspecified thoracic vertebra, subsequent encounter for fracture with routine healing: Secondary | ICD-10-CM | POA: Diagnosis not present

## 2022-11-21 DIAGNOSIS — K5790 Diverticulosis of intestine, part unspecified, without perforation or abscess without bleeding: Secondary | ICD-10-CM | POA: Diagnosis not present

## 2022-11-21 DIAGNOSIS — N186 End stage renal disease: Secondary | ICD-10-CM | POA: Diagnosis not present

## 2022-11-21 DIAGNOSIS — Z79899 Other long term (current) drug therapy: Secondary | ICD-10-CM | POA: Diagnosis not present

## 2022-11-21 DIAGNOSIS — M4802 Spinal stenosis, cervical region: Secondary | ICD-10-CM | POA: Diagnosis not present

## 2022-11-24 DIAGNOSIS — M6281 Muscle weakness (generalized): Secondary | ICD-10-CM | POA: Diagnosis not present

## 2022-11-24 DIAGNOSIS — I517 Cardiomegaly: Secondary | ICD-10-CM | POA: Diagnosis not present

## 2022-11-24 DIAGNOSIS — J189 Pneumonia, unspecified organism: Secondary | ICD-10-CM | POA: Diagnosis not present

## 2022-11-24 DIAGNOSIS — Z95828 Presence of other vascular implants and grafts: Secondary | ICD-10-CM | POA: Diagnosis not present

## 2022-11-24 DIAGNOSIS — R410 Disorientation, unspecified: Secondary | ICD-10-CM | POA: Diagnosis not present

## 2022-11-24 DIAGNOSIS — Z6841 Body Mass Index (BMI) 40.0 and over, adult: Secondary | ICD-10-CM | POA: Diagnosis not present

## 2022-11-24 DIAGNOSIS — R4182 Altered mental status, unspecified: Secondary | ICD-10-CM | POA: Diagnosis not present

## 2022-11-24 DIAGNOSIS — R109 Unspecified abdominal pain: Secondary | ICD-10-CM | POA: Diagnosis not present

## 2022-11-24 DIAGNOSIS — M4644 Discitis, unspecified, thoracic region: Secondary | ICD-10-CM | POA: Diagnosis not present

## 2022-11-24 DIAGNOSIS — M1A9XX0 Chronic gout, unspecified, without tophus (tophi): Secondary | ICD-10-CM | POA: Diagnosis not present

## 2022-11-24 DIAGNOSIS — R531 Weakness: Secondary | ICD-10-CM | POA: Diagnosis not present

## 2022-11-24 DIAGNOSIS — I959 Hypotension, unspecified: Secondary | ICD-10-CM | POA: Diagnosis not present

## 2022-11-24 DIAGNOSIS — Z91158 Patient's noncompliance with renal dialysis for other reason: Secondary | ICD-10-CM | POA: Diagnosis not present

## 2022-11-24 DIAGNOSIS — R918 Other nonspecific abnormal finding of lung field: Secondary | ICD-10-CM | POA: Diagnosis not present

## 2022-11-24 DIAGNOSIS — G4489 Other headache syndrome: Secondary | ICD-10-CM | POA: Diagnosis not present

## 2022-11-24 DIAGNOSIS — F419 Anxiety disorder, unspecified: Secondary | ICD-10-CM | POA: Diagnosis not present

## 2022-11-24 DIAGNOSIS — Z981 Arthrodesis status: Secondary | ICD-10-CM | POA: Diagnosis not present

## 2022-11-24 DIAGNOSIS — D631 Anemia in chronic kidney disease: Secondary | ICD-10-CM | POA: Diagnosis not present

## 2022-11-24 DIAGNOSIS — M1990 Unspecified osteoarthritis, unspecified site: Secondary | ICD-10-CM | POA: Diagnosis not present

## 2022-11-24 DIAGNOSIS — Z7401 Bed confinement status: Secondary | ICD-10-CM | POA: Diagnosis not present

## 2022-11-24 DIAGNOSIS — Z992 Dependence on renal dialysis: Secondary | ICD-10-CM | POA: Diagnosis not present

## 2022-11-24 DIAGNOSIS — R262 Difficulty in walking, not elsewhere classified: Secondary | ICD-10-CM | POA: Diagnosis not present

## 2022-11-24 DIAGNOSIS — Z86711 Personal history of pulmonary embolism: Secondary | ICD-10-CM | POA: Diagnosis not present

## 2022-11-24 DIAGNOSIS — N185 Chronic kidney disease, stage 5: Secondary | ICD-10-CM | POA: Diagnosis not present

## 2022-11-24 DIAGNOSIS — M869 Osteomyelitis, unspecified: Secondary | ICD-10-CM | POA: Diagnosis not present

## 2022-11-24 DIAGNOSIS — M4624 Osteomyelitis of vertebra, thoracic region: Secondary | ICD-10-CM | POA: Diagnosis not present

## 2022-11-24 DIAGNOSIS — Z9981 Dependence on supplemental oxygen: Secondary | ICD-10-CM | POA: Diagnosis not present

## 2022-11-24 DIAGNOSIS — S22008D Other fracture of unspecified thoracic vertebra, subsequent encounter for fracture with routine healing: Secondary | ICD-10-CM | POA: Diagnosis not present

## 2022-11-24 DIAGNOSIS — R0902 Hypoxemia: Secondary | ICD-10-CM | POA: Diagnosis not present

## 2022-11-24 DIAGNOSIS — F331 Major depressive disorder, recurrent, moderate: Secondary | ICD-10-CM | POA: Diagnosis not present

## 2022-11-24 DIAGNOSIS — M549 Dorsalgia, unspecified: Secondary | ICD-10-CM | POA: Diagnosis not present

## 2022-11-24 DIAGNOSIS — E785 Hyperlipidemia, unspecified: Secondary | ICD-10-CM | POA: Diagnosis not present

## 2022-11-24 DIAGNOSIS — E875 Hyperkalemia: Secondary | ICD-10-CM | POA: Diagnosis not present

## 2022-11-24 DIAGNOSIS — Z9049 Acquired absence of other specified parts of digestive tract: Secondary | ICD-10-CM | POA: Diagnosis not present

## 2022-11-24 DIAGNOSIS — N186 End stage renal disease: Secondary | ICD-10-CM | POA: Diagnosis not present

## 2022-11-24 DIAGNOSIS — M4802 Spinal stenosis, cervical region: Secondary | ICD-10-CM | POA: Diagnosis not present

## 2022-11-24 DIAGNOSIS — R400 Somnolence: Secondary | ICD-10-CM | POA: Diagnosis not present

## 2022-11-24 DIAGNOSIS — M109 Gout, unspecified: Secondary | ICD-10-CM | POA: Diagnosis not present

## 2022-11-24 DIAGNOSIS — J9610 Chronic respiratory failure, unspecified whether with hypoxia or hypercapnia: Secondary | ICD-10-CM | POA: Diagnosis not present

## 2022-11-24 DIAGNOSIS — K219 Gastro-esophageal reflux disease without esophagitis: Secondary | ICD-10-CM | POA: Diagnosis not present

## 2022-11-24 DIAGNOSIS — I120 Hypertensive chronic kidney disease with stage 5 chronic kidney disease or end stage renal disease: Secondary | ICD-10-CM | POA: Diagnosis not present

## 2022-11-24 DIAGNOSIS — Z888 Allergy status to other drugs, medicaments and biological substances status: Secondary | ICD-10-CM | POA: Diagnosis not present

## 2022-11-24 DIAGNOSIS — Z881 Allergy status to other antibiotic agents status: Secondary | ICD-10-CM | POA: Diagnosis not present

## 2022-11-24 DIAGNOSIS — I4430 Unspecified atrioventricular block: Secondary | ICD-10-CM | POA: Diagnosis not present

## 2022-11-24 DIAGNOSIS — M1712 Unilateral primary osteoarthritis, left knee: Secondary | ICD-10-CM | POA: Diagnosis not present

## 2022-11-24 DIAGNOSIS — M4804 Spinal stenosis, thoracic region: Secondary | ICD-10-CM | POA: Diagnosis not present

## 2022-11-24 DIAGNOSIS — T8463XA Infection and inflammatory reaction due to internal fixation device of spine, initial encounter: Secondary | ICD-10-CM | POA: Diagnosis not present

## 2022-11-24 DIAGNOSIS — G2581 Restless legs syndrome: Secondary | ICD-10-CM | POA: Diagnosis not present

## 2022-11-24 DIAGNOSIS — F321 Major depressive disorder, single episode, moderate: Secondary | ICD-10-CM | POA: Diagnosis not present

## 2022-11-24 DIAGNOSIS — T84226A Displacement of internal fixation device of vertebrae, initial encounter: Secondary | ICD-10-CM | POA: Diagnosis not present

## 2022-11-24 DIAGNOSIS — R609 Edema, unspecified: Secondary | ICD-10-CM | POA: Diagnosis not present

## 2022-11-24 DIAGNOSIS — I10 Essential (primary) hypertension: Secondary | ICD-10-CM | POA: Diagnosis not present

## 2022-11-25 DIAGNOSIS — M549 Dorsalgia, unspecified: Secondary | ICD-10-CM | POA: Diagnosis not present

## 2022-11-25 DIAGNOSIS — M4644 Discitis, unspecified, thoracic region: Secondary | ICD-10-CM | POA: Diagnosis not present

## 2022-11-25 DIAGNOSIS — J9610 Chronic respiratory failure, unspecified whether with hypoxia or hypercapnia: Secondary | ICD-10-CM | POA: Diagnosis not present

## 2022-11-25 DIAGNOSIS — E875 Hyperkalemia: Secondary | ICD-10-CM | POA: Diagnosis not present

## 2022-11-25 DIAGNOSIS — N186 End stage renal disease: Secondary | ICD-10-CM | POA: Diagnosis not present

## 2022-11-25 DIAGNOSIS — Z992 Dependence on renal dialysis: Secondary | ICD-10-CM | POA: Diagnosis not present

## 2022-11-25 DIAGNOSIS — K219 Gastro-esophageal reflux disease without esophagitis: Secondary | ICD-10-CM | POA: Diagnosis not present

## 2022-11-25 DIAGNOSIS — I10 Essential (primary) hypertension: Secondary | ICD-10-CM | POA: Diagnosis not present

## 2022-11-26 DIAGNOSIS — M869 Osteomyelitis, unspecified: Secondary | ICD-10-CM | POA: Diagnosis not present

## 2022-11-26 DIAGNOSIS — N186 End stage renal disease: Secondary | ICD-10-CM | POA: Diagnosis not present

## 2022-11-26 DIAGNOSIS — E875 Hyperkalemia: Secondary | ICD-10-CM | POA: Diagnosis not present

## 2022-11-26 DIAGNOSIS — Z992 Dependence on renal dialysis: Secondary | ICD-10-CM | POA: Diagnosis not present

## 2022-11-26 DIAGNOSIS — I120 Hypertensive chronic kidney disease with stage 5 chronic kidney disease or end stage renal disease: Secondary | ICD-10-CM | POA: Diagnosis not present

## 2022-11-26 DIAGNOSIS — Z981 Arthrodesis status: Secondary | ICD-10-CM | POA: Diagnosis not present

## 2022-11-26 DIAGNOSIS — M4804 Spinal stenosis, thoracic region: Secondary | ICD-10-CM | POA: Diagnosis not present

## 2022-11-26 DIAGNOSIS — M549 Dorsalgia, unspecified: Secondary | ICD-10-CM | POA: Diagnosis not present

## 2022-11-26 DIAGNOSIS — M4644 Discitis, unspecified, thoracic region: Secondary | ICD-10-CM | POA: Diagnosis not present

## 2022-11-26 DIAGNOSIS — R609 Edema, unspecified: Secondary | ICD-10-CM | POA: Diagnosis not present

## 2022-11-27 DIAGNOSIS — M549 Dorsalgia, unspecified: Secondary | ICD-10-CM | POA: Diagnosis not present

## 2022-11-27 DIAGNOSIS — M4644 Discitis, unspecified, thoracic region: Secondary | ICD-10-CM | POA: Diagnosis not present

## 2022-11-27 DIAGNOSIS — Z992 Dependence on renal dialysis: Secondary | ICD-10-CM | POA: Diagnosis not present

## 2022-11-27 DIAGNOSIS — E875 Hyperkalemia: Secondary | ICD-10-CM | POA: Diagnosis not present

## 2022-11-27 DIAGNOSIS — N186 End stage renal disease: Secondary | ICD-10-CM | POA: Diagnosis not present

## 2022-11-27 DIAGNOSIS — I120 Hypertensive chronic kidney disease with stage 5 chronic kidney disease or end stage renal disease: Secondary | ICD-10-CM | POA: Diagnosis not present

## 2022-11-28 DIAGNOSIS — E875 Hyperkalemia: Secondary | ICD-10-CM | POA: Diagnosis not present

## 2022-11-28 DIAGNOSIS — M4644 Discitis, unspecified, thoracic region: Secondary | ICD-10-CM | POA: Diagnosis not present

## 2022-11-28 DIAGNOSIS — M549 Dorsalgia, unspecified: Secondary | ICD-10-CM | POA: Diagnosis not present

## 2022-11-28 DIAGNOSIS — Z992 Dependence on renal dialysis: Secondary | ICD-10-CM | POA: Diagnosis not present

## 2022-11-28 DIAGNOSIS — I120 Hypertensive chronic kidney disease with stage 5 chronic kidney disease or end stage renal disease: Secondary | ICD-10-CM | POA: Diagnosis not present

## 2022-11-28 DIAGNOSIS — G2581 Restless legs syndrome: Secondary | ICD-10-CM | POA: Diagnosis not present

## 2022-11-28 DIAGNOSIS — N186 End stage renal disease: Secondary | ICD-10-CM | POA: Diagnosis not present

## 2022-11-29 DIAGNOSIS — J9610 Chronic respiratory failure, unspecified whether with hypoxia or hypercapnia: Secondary | ICD-10-CM | POA: Diagnosis not present

## 2022-11-29 DIAGNOSIS — M549 Dorsalgia, unspecified: Secondary | ICD-10-CM | POA: Diagnosis not present

## 2022-11-29 DIAGNOSIS — M4644 Discitis, unspecified, thoracic region: Secondary | ICD-10-CM | POA: Diagnosis not present

## 2022-11-29 DIAGNOSIS — Z992 Dependence on renal dialysis: Secondary | ICD-10-CM | POA: Diagnosis not present

## 2022-11-29 DIAGNOSIS — N186 End stage renal disease: Secondary | ICD-10-CM | POA: Diagnosis not present

## 2022-11-29 DIAGNOSIS — I10 Essential (primary) hypertension: Secondary | ICD-10-CM | POA: Diagnosis not present

## 2022-11-30 DIAGNOSIS — Z992 Dependence on renal dialysis: Secondary | ICD-10-CM | POA: Diagnosis not present

## 2022-11-30 DIAGNOSIS — M549 Dorsalgia, unspecified: Secondary | ICD-10-CM | POA: Diagnosis not present

## 2022-11-30 DIAGNOSIS — M4644 Discitis, unspecified, thoracic region: Secondary | ICD-10-CM | POA: Diagnosis not present

## 2022-11-30 DIAGNOSIS — N186 End stage renal disease: Secondary | ICD-10-CM | POA: Diagnosis not present

## 2022-12-01 DIAGNOSIS — Z992 Dependence on renal dialysis: Secondary | ICD-10-CM | POA: Diagnosis not present

## 2022-12-01 DIAGNOSIS — M4644 Discitis, unspecified, thoracic region: Secondary | ICD-10-CM | POA: Diagnosis not present

## 2022-12-01 DIAGNOSIS — N186 End stage renal disease: Secondary | ICD-10-CM | POA: Diagnosis not present

## 2022-12-01 DIAGNOSIS — M549 Dorsalgia, unspecified: Secondary | ICD-10-CM | POA: Diagnosis not present

## 2022-12-02 DIAGNOSIS — N186 End stage renal disease: Secondary | ICD-10-CM | POA: Diagnosis not present

## 2022-12-02 DIAGNOSIS — M549 Dorsalgia, unspecified: Secondary | ICD-10-CM | POA: Diagnosis not present

## 2022-12-02 DIAGNOSIS — M4644 Discitis, unspecified, thoracic region: Secondary | ICD-10-CM | POA: Diagnosis not present

## 2022-12-02 DIAGNOSIS — Z992 Dependence on renal dialysis: Secondary | ICD-10-CM | POA: Diagnosis not present

## 2022-12-03 DIAGNOSIS — M4644 Discitis, unspecified, thoracic region: Secondary | ICD-10-CM | POA: Diagnosis not present

## 2022-12-03 DIAGNOSIS — N186 End stage renal disease: Secondary | ICD-10-CM | POA: Diagnosis not present

## 2022-12-03 DIAGNOSIS — Z992 Dependence on renal dialysis: Secondary | ICD-10-CM | POA: Diagnosis not present

## 2022-12-04 DIAGNOSIS — Z992 Dependence on renal dialysis: Secondary | ICD-10-CM | POA: Diagnosis not present

## 2022-12-04 DIAGNOSIS — N186 End stage renal disease: Secondary | ICD-10-CM | POA: Diagnosis not present

## 2022-12-05 DIAGNOSIS — N186 End stage renal disease: Secondary | ICD-10-CM | POA: Diagnosis not present

## 2022-12-05 DIAGNOSIS — Z992 Dependence on renal dialysis: Secondary | ICD-10-CM | POA: Diagnosis not present

## 2022-12-06 DIAGNOSIS — Z992 Dependence on renal dialysis: Secondary | ICD-10-CM | POA: Diagnosis not present

## 2022-12-06 DIAGNOSIS — N186 End stage renal disease: Secondary | ICD-10-CM | POA: Diagnosis not present

## 2022-12-07 DIAGNOSIS — Z992 Dependence on renal dialysis: Secondary | ICD-10-CM | POA: Diagnosis not present

## 2022-12-07 DIAGNOSIS — N186 End stage renal disease: Secondary | ICD-10-CM | POA: Diagnosis not present

## 2022-12-08 DIAGNOSIS — F419 Anxiety disorder, unspecified: Secondary | ICD-10-CM | POA: Diagnosis not present

## 2022-12-08 DIAGNOSIS — F321 Major depressive disorder, single episode, moderate: Secondary | ICD-10-CM | POA: Diagnosis not present

## 2022-12-09 DIAGNOSIS — M4804 Spinal stenosis, thoracic region: Secondary | ICD-10-CM | POA: Diagnosis not present

## 2022-12-09 DIAGNOSIS — M4802 Spinal stenosis, cervical region: Secondary | ICD-10-CM | POA: Diagnosis not present

## 2022-12-10 DIAGNOSIS — D509 Iron deficiency anemia, unspecified: Secondary | ICD-10-CM | POA: Diagnosis not present

## 2022-12-10 DIAGNOSIS — I129 Hypertensive chronic kidney disease with stage 1 through stage 4 chronic kidney disease, or unspecified chronic kidney disease: Secondary | ICD-10-CM | POA: Diagnosis not present

## 2022-12-10 DIAGNOSIS — J9601 Acute respiratory failure with hypoxia: Secondary | ICD-10-CM | POA: Diagnosis not present

## 2022-12-10 DIAGNOSIS — R29898 Other symptoms and signs involving the musculoskeletal system: Secondary | ICD-10-CM | POA: Diagnosis not present

## 2022-12-10 DIAGNOSIS — F32A Depression, unspecified: Secondary | ICD-10-CM | POA: Diagnosis not present

## 2022-12-10 DIAGNOSIS — I10 Essential (primary) hypertension: Secondary | ICD-10-CM | POA: Diagnosis not present

## 2022-12-10 DIAGNOSIS — F430 Acute stress reaction: Secondary | ICD-10-CM | POA: Diagnosis not present

## 2022-12-10 DIAGNOSIS — Z7401 Bed confinement status: Secondary | ICD-10-CM | POA: Diagnosis not present

## 2022-12-10 DIAGNOSIS — G2581 Restless legs syndrome: Secondary | ICD-10-CM | POA: Diagnosis not present

## 2022-12-10 DIAGNOSIS — K219 Gastro-esophageal reflux disease without esophagitis: Secondary | ICD-10-CM | POA: Diagnosis not present

## 2022-12-10 DIAGNOSIS — R410 Disorientation, unspecified: Secondary | ICD-10-CM | POA: Diagnosis not present

## 2022-12-10 DIAGNOSIS — M4644 Discitis, unspecified, thoracic region: Secondary | ICD-10-CM | POA: Diagnosis not present

## 2022-12-10 DIAGNOSIS — N250 Renal osteodystrophy: Secondary | ICD-10-CM | POA: Diagnosis not present

## 2022-12-10 DIAGNOSIS — J9612 Chronic respiratory failure with hypercapnia: Secondary | ICD-10-CM | POA: Diagnosis not present

## 2022-12-10 DIAGNOSIS — N186 End stage renal disease: Secondary | ICD-10-CM | POA: Diagnosis not present

## 2022-12-10 DIAGNOSIS — Z992 Dependence on renal dialysis: Secondary | ICD-10-CM | POA: Diagnosis not present

## 2022-12-10 DIAGNOSIS — E785 Hyperlipidemia, unspecified: Secondary | ICD-10-CM | POA: Diagnosis not present

## 2022-12-10 DIAGNOSIS — S22008D Other fracture of unspecified thoracic vertebra, subsequent encounter for fracture with routine healing: Secondary | ICD-10-CM | POA: Diagnosis not present

## 2022-12-10 DIAGNOSIS — R262 Difficulty in walking, not elsewhere classified: Secondary | ICD-10-CM | POA: Diagnosis not present

## 2022-12-10 DIAGNOSIS — M6281 Muscle weakness (generalized): Secondary | ICD-10-CM | POA: Diagnosis not present

## 2022-12-10 DIAGNOSIS — J9610 Chronic respiratory failure, unspecified whether with hypoxia or hypercapnia: Secondary | ICD-10-CM | POA: Diagnosis not present

## 2022-12-10 DIAGNOSIS — N185 Chronic kidney disease, stage 5: Secondary | ICD-10-CM | POA: Diagnosis not present

## 2022-12-10 DIAGNOSIS — Z86711 Personal history of pulmonary embolism: Secondary | ICD-10-CM | POA: Diagnosis not present

## 2022-12-10 DIAGNOSIS — M4624 Osteomyelitis of vertebra, thoracic region: Secondary | ICD-10-CM | POA: Diagnosis not present

## 2022-12-10 DIAGNOSIS — M1712 Unilateral primary osteoarthritis, left knee: Secondary | ICD-10-CM | POA: Diagnosis not present

## 2022-12-10 DIAGNOSIS — R400 Somnolence: Secondary | ICD-10-CM | POA: Diagnosis not present

## 2022-12-10 DIAGNOSIS — I959 Hypotension, unspecified: Secondary | ICD-10-CM | POA: Diagnosis not present

## 2022-12-10 DIAGNOSIS — Z6841 Body Mass Index (BMI) 40.0 and over, adult: Secondary | ICD-10-CM | POA: Diagnosis not present

## 2022-12-10 DIAGNOSIS — J189 Pneumonia, unspecified organism: Secondary | ICD-10-CM | POA: Diagnosis not present

## 2022-12-10 DIAGNOSIS — D631 Anemia in chronic kidney disease: Secondary | ICD-10-CM | POA: Diagnosis not present

## 2022-12-10 DIAGNOSIS — I132 Hypertensive heart and chronic kidney disease with heart failure and with stage 5 chronic kidney disease, or end stage renal disease: Secondary | ICD-10-CM | POA: Diagnosis not present

## 2022-12-10 DIAGNOSIS — M1A9XX0 Chronic gout, unspecified, without tophus (tophi): Secondary | ICD-10-CM | POA: Diagnosis not present

## 2022-12-10 DIAGNOSIS — M549 Dorsalgia, unspecified: Secondary | ICD-10-CM | POA: Diagnosis not present

## 2022-12-11 DIAGNOSIS — Z992 Dependence on renal dialysis: Secondary | ICD-10-CM | POA: Diagnosis not present

## 2022-12-11 DIAGNOSIS — I132 Hypertensive heart and chronic kidney disease with heart failure and with stage 5 chronic kidney disease, or end stage renal disease: Secondary | ICD-10-CM | POA: Diagnosis not present

## 2022-12-11 DIAGNOSIS — J9601 Acute respiratory failure with hypoxia: Secondary | ICD-10-CM | POA: Diagnosis not present

## 2022-12-11 DIAGNOSIS — D509 Iron deficiency anemia, unspecified: Secondary | ICD-10-CM | POA: Diagnosis not present

## 2022-12-11 DIAGNOSIS — Z86711 Personal history of pulmonary embolism: Secondary | ICD-10-CM | POA: Diagnosis not present

## 2022-12-11 DIAGNOSIS — D631 Anemia in chronic kidney disease: Secondary | ICD-10-CM | POA: Diagnosis not present

## 2022-12-11 DIAGNOSIS — K219 Gastro-esophageal reflux disease without esophagitis: Secondary | ICD-10-CM | POA: Diagnosis not present

## 2022-12-11 DIAGNOSIS — N250 Renal osteodystrophy: Secondary | ICD-10-CM | POA: Diagnosis not present

## 2022-12-11 DIAGNOSIS — J9612 Chronic respiratory failure with hypercapnia: Secondary | ICD-10-CM | POA: Diagnosis not present

## 2022-12-11 DIAGNOSIS — M4644 Discitis, unspecified, thoracic region: Secondary | ICD-10-CM | POA: Diagnosis not present

## 2022-12-11 DIAGNOSIS — N186 End stage renal disease: Secondary | ICD-10-CM | POA: Diagnosis not present

## 2022-12-12 DIAGNOSIS — M4644 Discitis, unspecified, thoracic region: Secondary | ICD-10-CM | POA: Diagnosis not present

## 2022-12-12 DIAGNOSIS — M549 Dorsalgia, unspecified: Secondary | ICD-10-CM | POA: Diagnosis not present

## 2022-12-12 DIAGNOSIS — F32A Depression, unspecified: Secondary | ICD-10-CM | POA: Diagnosis not present

## 2022-12-12 DIAGNOSIS — M4624 Osteomyelitis of vertebra, thoracic region: Secondary | ICD-10-CM | POA: Diagnosis not present

## 2022-12-13 DIAGNOSIS — D631 Anemia in chronic kidney disease: Secondary | ICD-10-CM | POA: Diagnosis not present

## 2022-12-13 DIAGNOSIS — D509 Iron deficiency anemia, unspecified: Secondary | ICD-10-CM | POA: Diagnosis not present

## 2022-12-13 DIAGNOSIS — Z992 Dependence on renal dialysis: Secondary | ICD-10-CM | POA: Diagnosis not present

## 2022-12-13 DIAGNOSIS — N186 End stage renal disease: Secondary | ICD-10-CM | POA: Diagnosis not present

## 2022-12-13 DIAGNOSIS — N250 Renal osteodystrophy: Secondary | ICD-10-CM | POA: Diagnosis not present

## 2022-12-15 DIAGNOSIS — M549 Dorsalgia, unspecified: Secondary | ICD-10-CM | POA: Diagnosis not present

## 2022-12-15 DIAGNOSIS — F32A Depression, unspecified: Secondary | ICD-10-CM | POA: Diagnosis not present

## 2022-12-15 DIAGNOSIS — K219 Gastro-esophageal reflux disease without esophagitis: Secondary | ICD-10-CM | POA: Diagnosis not present

## 2022-12-15 DIAGNOSIS — M4644 Discitis, unspecified, thoracic region: Secondary | ICD-10-CM | POA: Diagnosis not present

## 2022-12-15 DIAGNOSIS — N186 End stage renal disease: Secondary | ICD-10-CM | POA: Diagnosis not present

## 2022-12-15 DIAGNOSIS — I132 Hypertensive heart and chronic kidney disease with heart failure and with stage 5 chronic kidney disease, or end stage renal disease: Secondary | ICD-10-CM | POA: Diagnosis not present

## 2022-12-15 DIAGNOSIS — M4624 Osteomyelitis of vertebra, thoracic region: Secondary | ICD-10-CM | POA: Diagnosis not present

## 2022-12-15 DIAGNOSIS — J9612 Chronic respiratory failure with hypercapnia: Secondary | ICD-10-CM | POA: Diagnosis not present

## 2022-12-16 DIAGNOSIS — I129 Hypertensive chronic kidney disease with stage 1 through stage 4 chronic kidney disease, or unspecified chronic kidney disease: Secondary | ICD-10-CM | POA: Diagnosis not present

## 2022-12-16 DIAGNOSIS — N250 Renal osteodystrophy: Secondary | ICD-10-CM | POA: Diagnosis not present

## 2022-12-16 DIAGNOSIS — N186 End stage renal disease: Secondary | ICD-10-CM | POA: Diagnosis not present

## 2022-12-16 DIAGNOSIS — D509 Iron deficiency anemia, unspecified: Secondary | ICD-10-CM | POA: Diagnosis not present

## 2022-12-16 DIAGNOSIS — D631 Anemia in chronic kidney disease: Secondary | ICD-10-CM | POA: Diagnosis not present

## 2022-12-16 DIAGNOSIS — Z992 Dependence on renal dialysis: Secondary | ICD-10-CM | POA: Diagnosis not present

## 2022-12-16 DIAGNOSIS — F32A Depression, unspecified: Secondary | ICD-10-CM | POA: Diagnosis not present

## 2022-12-18 DIAGNOSIS — M1A9XX0 Chronic gout, unspecified, without tophus (tophi): Secondary | ICD-10-CM | POA: Diagnosis not present

## 2022-12-18 DIAGNOSIS — S22008D Other fracture of unspecified thoracic vertebra, subsequent encounter for fracture with routine healing: Secondary | ICD-10-CM | POA: Diagnosis not present

## 2022-12-18 DIAGNOSIS — G2581 Restless legs syndrome: Secondary | ICD-10-CM | POA: Diagnosis not present

## 2022-12-18 DIAGNOSIS — Z6841 Body Mass Index (BMI) 40.0 and over, adult: Secondary | ICD-10-CM | POA: Diagnosis not present

## 2022-12-18 DIAGNOSIS — E785 Hyperlipidemia, unspecified: Secondary | ICD-10-CM | POA: Diagnosis not present

## 2022-12-18 DIAGNOSIS — M4624 Osteomyelitis of vertebra, thoracic region: Secondary | ICD-10-CM | POA: Diagnosis not present

## 2022-12-18 DIAGNOSIS — M4644 Discitis, unspecified, thoracic region: Secondary | ICD-10-CM | POA: Diagnosis not present

## 2022-12-18 DIAGNOSIS — J189 Pneumonia, unspecified organism: Secondary | ICD-10-CM | POA: Diagnosis not present

## 2022-12-18 DIAGNOSIS — F32A Depression, unspecified: Secondary | ICD-10-CM | POA: Diagnosis not present

## 2022-12-18 DIAGNOSIS — Z992 Dependence on renal dialysis: Secondary | ICD-10-CM | POA: Diagnosis not present

## 2022-12-18 DIAGNOSIS — N186 End stage renal disease: Secondary | ICD-10-CM | POA: Diagnosis not present

## 2022-12-18 DIAGNOSIS — J9612 Chronic respiratory failure with hypercapnia: Secondary | ICD-10-CM | POA: Diagnosis not present

## 2022-12-18 DIAGNOSIS — D631 Anemia in chronic kidney disease: Secondary | ICD-10-CM | POA: Diagnosis not present

## 2022-12-18 DIAGNOSIS — Z86711 Personal history of pulmonary embolism: Secondary | ICD-10-CM | POA: Diagnosis not present

## 2022-12-18 DIAGNOSIS — K219 Gastro-esophageal reflux disease without esophagitis: Secondary | ICD-10-CM | POA: Diagnosis not present

## 2022-12-18 DIAGNOSIS — I132 Hypertensive heart and chronic kidney disease with heart failure and with stage 5 chronic kidney disease, or end stage renal disease: Secondary | ICD-10-CM | POA: Diagnosis not present

## 2022-12-19 DIAGNOSIS — R531 Weakness: Secondary | ICD-10-CM | POA: Diagnosis present

## 2022-12-19 DIAGNOSIS — Z8673 Personal history of transient ischemic attack (TIA), and cerebral infarction without residual deficits: Secondary | ICD-10-CM | POA: Diagnosis not present

## 2022-12-19 DIAGNOSIS — J189 Pneumonia, unspecified organism: Secondary | ICD-10-CM | POA: Diagnosis not present

## 2022-12-19 DIAGNOSIS — T8463XA Infection and inflammatory reaction due to internal fixation device of spine, initial encounter: Secondary | ICD-10-CM | POA: Diagnosis present

## 2022-12-19 DIAGNOSIS — Z980 Intestinal bypass and anastomosis status: Secondary | ICD-10-CM | POA: Diagnosis not present

## 2022-12-19 DIAGNOSIS — E785 Hyperlipidemia, unspecified: Secondary | ICD-10-CM | POA: Diagnosis present

## 2022-12-19 DIAGNOSIS — G9340 Encephalopathy, unspecified: Secondary | ICD-10-CM | POA: Diagnosis present

## 2022-12-19 DIAGNOSIS — M4624 Osteomyelitis of vertebra, thoracic region: Secondary | ICD-10-CM | POA: Diagnosis not present

## 2022-12-19 DIAGNOSIS — I132 Hypertensive heart and chronic kidney disease with heart failure and with stage 5 chronic kidney disease, or end stage renal disease: Secondary | ICD-10-CM | POA: Diagnosis present

## 2022-12-19 DIAGNOSIS — N186 End stage renal disease: Secondary | ICD-10-CM | POA: Diagnosis present

## 2022-12-19 DIAGNOSIS — I10 Essential (primary) hypertension: Secondary | ICD-10-CM | POA: Diagnosis not present

## 2022-12-19 DIAGNOSIS — Z9981 Dependence on supplemental oxygen: Secondary | ICD-10-CM | POA: Diagnosis not present

## 2022-12-19 DIAGNOSIS — R0689 Other abnormalities of breathing: Secondary | ICD-10-CM | POA: Diagnosis not present

## 2022-12-19 DIAGNOSIS — K219 Gastro-esophageal reflux disease without esophagitis: Secondary | ICD-10-CM | POA: Diagnosis present

## 2022-12-19 DIAGNOSIS — S22008D Other fracture of unspecified thoracic vertebra, subsequent encounter for fracture with routine healing: Secondary | ICD-10-CM | POA: Diagnosis not present

## 2022-12-19 DIAGNOSIS — I120 Hypertensive chronic kidney disease with stage 5 chronic kidney disease or end stage renal disease: Secondary | ICD-10-CM | POA: Diagnosis not present

## 2022-12-19 DIAGNOSIS — N390 Urinary tract infection, site not specified: Secondary | ICD-10-CM | POA: Diagnosis not present

## 2022-12-19 DIAGNOSIS — Z7401 Bed confinement status: Secondary | ICD-10-CM | POA: Diagnosis not present

## 2022-12-19 DIAGNOSIS — I672 Cerebral atherosclerosis: Secondary | ICD-10-CM | POA: Diagnosis not present

## 2022-12-19 DIAGNOSIS — Z6841 Body Mass Index (BMI) 40.0 and over, adult: Secondary | ICD-10-CM | POA: Diagnosis not present

## 2022-12-19 DIAGNOSIS — D631 Anemia in chronic kidney disease: Secondary | ICD-10-CM | POA: Diagnosis not present

## 2022-12-19 DIAGNOSIS — Z79899 Other long term (current) drug therapy: Secondary | ICD-10-CM | POA: Diagnosis not present

## 2022-12-19 DIAGNOSIS — M109 Gout, unspecified: Secondary | ICD-10-CM | POA: Diagnosis present

## 2022-12-19 DIAGNOSIS — M4644 Discitis, unspecified, thoracic region: Secondary | ICD-10-CM | POA: Diagnosis not present

## 2022-12-19 DIAGNOSIS — N250 Renal osteodystrophy: Secondary | ICD-10-CM | POA: Diagnosis present

## 2022-12-19 DIAGNOSIS — R55 Syncope and collapse: Secondary | ICD-10-CM | POA: Diagnosis not present

## 2022-12-19 DIAGNOSIS — R4182 Altered mental status, unspecified: Secondary | ICD-10-CM | POA: Diagnosis not present

## 2022-12-19 DIAGNOSIS — I953 Hypotension of hemodialysis: Secondary | ICD-10-CM | POA: Diagnosis present

## 2022-12-19 DIAGNOSIS — R29898 Other symptoms and signs involving the musculoskeletal system: Secondary | ICD-10-CM | POA: Diagnosis not present

## 2022-12-19 DIAGNOSIS — J9612 Chronic respiratory failure with hypercapnia: Secondary | ICD-10-CM | POA: Diagnosis not present

## 2022-12-19 DIAGNOSIS — Z992 Dependence on renal dialysis: Secondary | ICD-10-CM | POA: Diagnosis not present

## 2022-12-19 DIAGNOSIS — R202 Paresthesia of skin: Secondary | ICD-10-CM | POA: Diagnosis not present

## 2022-12-19 DIAGNOSIS — J9610 Chronic respiratory failure, unspecified whether with hypoxia or hypercapnia: Secondary | ICD-10-CM | POA: Diagnosis present

## 2022-12-19 DIAGNOSIS — I5030 Unspecified diastolic (congestive) heart failure: Secondary | ICD-10-CM | POA: Diagnosis present

## 2022-12-19 DIAGNOSIS — M549 Dorsalgia, unspecified: Secondary | ICD-10-CM | POA: Diagnosis not present

## 2022-12-20 DIAGNOSIS — J9610 Chronic respiratory failure, unspecified whether with hypoxia or hypercapnia: Secondary | ICD-10-CM | POA: Diagnosis not present

## 2022-12-20 DIAGNOSIS — G9340 Encephalopathy, unspecified: Secondary | ICD-10-CM | POA: Diagnosis not present

## 2022-12-20 DIAGNOSIS — I10 Essential (primary) hypertension: Secondary | ICD-10-CM | POA: Diagnosis not present

## 2022-12-20 DIAGNOSIS — M4644 Discitis, unspecified, thoracic region: Secondary | ICD-10-CM | POA: Diagnosis not present

## 2022-12-20 DIAGNOSIS — N186 End stage renal disease: Secondary | ICD-10-CM | POA: Diagnosis not present

## 2022-12-20 DIAGNOSIS — Z6841 Body Mass Index (BMI) 40.0 and over, adult: Secondary | ICD-10-CM | POA: Diagnosis not present

## 2022-12-20 DIAGNOSIS — R55 Syncope and collapse: Secondary | ICD-10-CM | POA: Diagnosis not present

## 2022-12-20 DIAGNOSIS — K219 Gastro-esophageal reflux disease without esophagitis: Secondary | ICD-10-CM | POA: Diagnosis not present

## 2022-12-21 DIAGNOSIS — Z6841 Body Mass Index (BMI) 40.0 and over, adult: Secondary | ICD-10-CM | POA: Diagnosis not present

## 2022-12-21 DIAGNOSIS — M4644 Discitis, unspecified, thoracic region: Secondary | ICD-10-CM | POA: Diagnosis not present

## 2022-12-21 DIAGNOSIS — N186 End stage renal disease: Secondary | ICD-10-CM | POA: Diagnosis not present

## 2022-12-21 DIAGNOSIS — R55 Syncope and collapse: Secondary | ICD-10-CM | POA: Diagnosis not present

## 2022-12-21 DIAGNOSIS — I10 Essential (primary) hypertension: Secondary | ICD-10-CM | POA: Diagnosis not present

## 2022-12-21 DIAGNOSIS — G9340 Encephalopathy, unspecified: Secondary | ICD-10-CM | POA: Diagnosis not present

## 2022-12-21 DIAGNOSIS — J9610 Chronic respiratory failure, unspecified whether with hypoxia or hypercapnia: Secondary | ICD-10-CM | POA: Diagnosis not present

## 2022-12-21 DIAGNOSIS — K219 Gastro-esophageal reflux disease without esophagitis: Secondary | ICD-10-CM | POA: Diagnosis not present

## 2022-12-22 DIAGNOSIS — R531 Weakness: Secondary | ICD-10-CM | POA: Diagnosis present

## 2022-12-22 DIAGNOSIS — K219 Gastro-esophageal reflux disease without esophagitis: Secondary | ICD-10-CM | POA: Diagnosis present

## 2022-12-22 DIAGNOSIS — D631 Anemia in chronic kidney disease: Secondary | ICD-10-CM | POA: Diagnosis present

## 2022-12-22 DIAGNOSIS — I953 Hypotension of hemodialysis: Secondary | ICD-10-CM | POA: Diagnosis present

## 2022-12-22 DIAGNOSIS — Z8673 Personal history of transient ischemic attack (TIA), and cerebral infarction without residual deficits: Secondary | ICD-10-CM | POA: Diagnosis not present

## 2022-12-22 DIAGNOSIS — N186 End stage renal disease: Secondary | ICD-10-CM | POA: Diagnosis present

## 2022-12-22 DIAGNOSIS — G9340 Encephalopathy, unspecified: Secondary | ICD-10-CM | POA: Diagnosis present

## 2022-12-22 DIAGNOSIS — Z9981 Dependence on supplemental oxygen: Secondary | ICD-10-CM | POA: Diagnosis not present

## 2022-12-22 DIAGNOSIS — Z980 Intestinal bypass and anastomosis status: Secondary | ICD-10-CM | POA: Diagnosis not present

## 2022-12-22 DIAGNOSIS — R55 Syncope and collapse: Secondary | ICD-10-CM | POA: Diagnosis not present

## 2022-12-22 DIAGNOSIS — Z992 Dependence on renal dialysis: Secondary | ICD-10-CM | POA: Diagnosis not present

## 2022-12-22 DIAGNOSIS — M109 Gout, unspecified: Secondary | ICD-10-CM | POA: Diagnosis present

## 2022-12-22 DIAGNOSIS — Z7401 Bed confinement status: Secondary | ICD-10-CM | POA: Diagnosis not present

## 2022-12-22 DIAGNOSIS — M4644 Discitis, unspecified, thoracic region: Secondary | ICD-10-CM | POA: Diagnosis present

## 2022-12-22 DIAGNOSIS — J9610 Chronic respiratory failure, unspecified whether with hypoxia or hypercapnia: Secondary | ICD-10-CM | POA: Diagnosis present

## 2022-12-22 DIAGNOSIS — N250 Renal osteodystrophy: Secondary | ICD-10-CM | POA: Diagnosis present

## 2022-12-22 DIAGNOSIS — Z79899 Other long term (current) drug therapy: Secondary | ICD-10-CM | POA: Diagnosis not present

## 2022-12-22 DIAGNOSIS — E785 Hyperlipidemia, unspecified: Secondary | ICD-10-CM | POA: Diagnosis present

## 2022-12-22 DIAGNOSIS — I5030 Unspecified diastolic (congestive) heart failure: Secondary | ICD-10-CM | POA: Diagnosis present

## 2022-12-22 DIAGNOSIS — R29898 Other symptoms and signs involving the musculoskeletal system: Secondary | ICD-10-CM | POA: Diagnosis not present

## 2022-12-22 DIAGNOSIS — I120 Hypertensive chronic kidney disease with stage 5 chronic kidney disease or end stage renal disease: Secondary | ICD-10-CM | POA: Diagnosis not present

## 2022-12-22 DIAGNOSIS — I132 Hypertensive heart and chronic kidney disease with heart failure and with stage 5 chronic kidney disease, or end stage renal disease: Secondary | ICD-10-CM | POA: Diagnosis present

## 2022-12-22 DIAGNOSIS — T8463XA Infection and inflammatory reaction due to internal fixation device of spine, initial encounter: Secondary | ICD-10-CM | POA: Diagnosis present

## 2022-12-22 DIAGNOSIS — Z6841 Body Mass Index (BMI) 40.0 and over, adult: Secondary | ICD-10-CM | POA: Diagnosis not present

## 2022-12-28 DIAGNOSIS — Z992 Dependence on renal dialysis: Secondary | ICD-10-CM | POA: Diagnosis not present

## 2022-12-28 DIAGNOSIS — N186 End stage renal disease: Secondary | ICD-10-CM | POA: Diagnosis not present

## 2022-12-29 DIAGNOSIS — J9612 Chronic respiratory failure with hypercapnia: Secondary | ICD-10-CM | POA: Diagnosis not present

## 2022-12-29 DIAGNOSIS — I132 Hypertensive heart and chronic kidney disease with heart failure and with stage 5 chronic kidney disease, or end stage renal disease: Secondary | ICD-10-CM | POA: Diagnosis not present

## 2022-12-29 DIAGNOSIS — J189 Pneumonia, unspecified organism: Secondary | ICD-10-CM | POA: Diagnosis not present

## 2022-12-29 DIAGNOSIS — M4644 Discitis, unspecified, thoracic region: Secondary | ICD-10-CM | POA: Diagnosis not present

## 2022-12-29 DIAGNOSIS — M4624 Osteomyelitis of vertebra, thoracic region: Secondary | ICD-10-CM | POA: Diagnosis not present

## 2022-12-29 DIAGNOSIS — S22008D Other fracture of unspecified thoracic vertebra, subsequent encounter for fracture with routine healing: Secondary | ICD-10-CM | POA: Diagnosis not present

## 2022-12-30 DIAGNOSIS — Z992 Dependence on renal dialysis: Secondary | ICD-10-CM | POA: Diagnosis not present

## 2022-12-30 DIAGNOSIS — N250 Renal osteodystrophy: Secondary | ICD-10-CM | POA: Diagnosis not present

## 2022-12-30 DIAGNOSIS — D631 Anemia in chronic kidney disease: Secondary | ICD-10-CM | POA: Diagnosis not present

## 2022-12-30 DIAGNOSIS — N186 End stage renal disease: Secondary | ICD-10-CM | POA: Diagnosis not present

## 2022-12-30 DIAGNOSIS — D509 Iron deficiency anemia, unspecified: Secondary | ICD-10-CM | POA: Diagnosis not present

## 2022-12-31 DIAGNOSIS — M4644 Discitis, unspecified, thoracic region: Secondary | ICD-10-CM | POA: Diagnosis not present

## 2022-12-31 DIAGNOSIS — J9612 Chronic respiratory failure with hypercapnia: Secondary | ICD-10-CM | POA: Diagnosis not present

## 2022-12-31 DIAGNOSIS — S22008D Other fracture of unspecified thoracic vertebra, subsequent encounter for fracture with routine healing: Secondary | ICD-10-CM | POA: Diagnosis not present

## 2022-12-31 DIAGNOSIS — J189 Pneumonia, unspecified organism: Secondary | ICD-10-CM | POA: Diagnosis not present

## 2022-12-31 DIAGNOSIS — I132 Hypertensive heart and chronic kidney disease with heart failure and with stage 5 chronic kidney disease, or end stage renal disease: Secondary | ICD-10-CM | POA: Diagnosis not present

## 2022-12-31 DIAGNOSIS — M4624 Osteomyelitis of vertebra, thoracic region: Secondary | ICD-10-CM | POA: Diagnosis not present

## 2023-01-03 DIAGNOSIS — N186 End stage renal disease: Secondary | ICD-10-CM | POA: Diagnosis not present

## 2023-01-03 DIAGNOSIS — D631 Anemia in chronic kidney disease: Secondary | ICD-10-CM | POA: Diagnosis not present

## 2023-01-03 DIAGNOSIS — N250 Renal osteodystrophy: Secondary | ICD-10-CM | POA: Diagnosis not present

## 2023-01-03 DIAGNOSIS — D509 Iron deficiency anemia, unspecified: Secondary | ICD-10-CM | POA: Diagnosis not present

## 2023-01-03 DIAGNOSIS — Z992 Dependence on renal dialysis: Secondary | ICD-10-CM | POA: Diagnosis not present

## 2023-01-05 DIAGNOSIS — J189 Pneumonia, unspecified organism: Secondary | ICD-10-CM | POA: Diagnosis not present

## 2023-01-05 DIAGNOSIS — J9612 Chronic respiratory failure with hypercapnia: Secondary | ICD-10-CM | POA: Diagnosis not present

## 2023-01-05 DIAGNOSIS — M4624 Osteomyelitis of vertebra, thoracic region: Secondary | ICD-10-CM | POA: Diagnosis not present

## 2023-01-05 DIAGNOSIS — M4644 Discitis, unspecified, thoracic region: Secondary | ICD-10-CM | POA: Diagnosis not present

## 2023-01-05 DIAGNOSIS — I132 Hypertensive heart and chronic kidney disease with heart failure and with stage 5 chronic kidney disease, or end stage renal disease: Secondary | ICD-10-CM | POA: Diagnosis not present

## 2023-01-05 DIAGNOSIS — Z86711 Personal history of pulmonary embolism: Secondary | ICD-10-CM | POA: Diagnosis not present

## 2023-01-05 DIAGNOSIS — S22008D Other fracture of unspecified thoracic vertebra, subsequent encounter for fracture with routine healing: Secondary | ICD-10-CM | POA: Diagnosis not present

## 2023-01-05 DIAGNOSIS — Z792 Long term (current) use of antibiotics: Secondary | ICD-10-CM | POA: Diagnosis not present

## 2023-01-06 DIAGNOSIS — D509 Iron deficiency anemia, unspecified: Secondary | ICD-10-CM | POA: Diagnosis not present

## 2023-01-06 DIAGNOSIS — Z992 Dependence on renal dialysis: Secondary | ICD-10-CM | POA: Diagnosis not present

## 2023-01-06 DIAGNOSIS — N250 Renal osteodystrophy: Secondary | ICD-10-CM | POA: Diagnosis not present

## 2023-01-06 DIAGNOSIS — D631 Anemia in chronic kidney disease: Secondary | ICD-10-CM | POA: Diagnosis not present

## 2023-01-06 DIAGNOSIS — N186 End stage renal disease: Secondary | ICD-10-CM | POA: Diagnosis not present

## 2023-01-08 DIAGNOSIS — J189 Pneumonia, unspecified organism: Secondary | ICD-10-CM | POA: Diagnosis not present

## 2023-01-08 DIAGNOSIS — M4644 Discitis, unspecified, thoracic region: Secondary | ICD-10-CM | POA: Diagnosis not present

## 2023-01-08 DIAGNOSIS — S22008D Other fracture of unspecified thoracic vertebra, subsequent encounter for fracture with routine healing: Secondary | ICD-10-CM | POA: Diagnosis not present

## 2023-01-08 DIAGNOSIS — I132 Hypertensive heart and chronic kidney disease with heart failure and with stage 5 chronic kidney disease, or end stage renal disease: Secondary | ICD-10-CM | POA: Diagnosis not present

## 2023-01-08 DIAGNOSIS — J9612 Chronic respiratory failure with hypercapnia: Secondary | ICD-10-CM | POA: Diagnosis not present

## 2023-01-08 DIAGNOSIS — M4624 Osteomyelitis of vertebra, thoracic region: Secondary | ICD-10-CM | POA: Diagnosis not present

## 2023-01-10 DIAGNOSIS — Z992 Dependence on renal dialysis: Secondary | ICD-10-CM | POA: Diagnosis not present

## 2023-01-10 DIAGNOSIS — D631 Anemia in chronic kidney disease: Secondary | ICD-10-CM | POA: Diagnosis not present

## 2023-01-10 DIAGNOSIS — N250 Renal osteodystrophy: Secondary | ICD-10-CM | POA: Diagnosis not present

## 2023-01-10 DIAGNOSIS — D509 Iron deficiency anemia, unspecified: Secondary | ICD-10-CM | POA: Diagnosis not present

## 2023-01-10 DIAGNOSIS — N186 End stage renal disease: Secondary | ICD-10-CM | POA: Diagnosis not present

## 2023-01-12 DIAGNOSIS — J189 Pneumonia, unspecified organism: Secondary | ICD-10-CM | POA: Diagnosis not present

## 2023-01-12 DIAGNOSIS — J9612 Chronic respiratory failure with hypercapnia: Secondary | ICD-10-CM | POA: Diagnosis not present

## 2023-01-12 DIAGNOSIS — M4624 Osteomyelitis of vertebra, thoracic region: Secondary | ICD-10-CM | POA: Diagnosis not present

## 2023-01-12 DIAGNOSIS — Z79899 Other long term (current) drug therapy: Secondary | ICD-10-CM | POA: Diagnosis not present

## 2023-01-12 DIAGNOSIS — S22008D Other fracture of unspecified thoracic vertebra, subsequent encounter for fracture with routine healing: Secondary | ICD-10-CM | POA: Diagnosis not present

## 2023-01-12 DIAGNOSIS — I132 Hypertensive heart and chronic kidney disease with heart failure and with stage 5 chronic kidney disease, or end stage renal disease: Secondary | ICD-10-CM | POA: Diagnosis not present

## 2023-01-12 DIAGNOSIS — M4644 Discitis, unspecified, thoracic region: Secondary | ICD-10-CM | POA: Diagnosis not present

## 2023-01-13 DIAGNOSIS — D509 Iron deficiency anemia, unspecified: Secondary | ICD-10-CM | POA: Diagnosis not present

## 2023-01-13 DIAGNOSIS — Z992 Dependence on renal dialysis: Secondary | ICD-10-CM | POA: Diagnosis not present

## 2023-01-13 DIAGNOSIS — D631 Anemia in chronic kidney disease: Secondary | ICD-10-CM | POA: Diagnosis not present

## 2023-01-13 DIAGNOSIS — N186 End stage renal disease: Secondary | ICD-10-CM | POA: Diagnosis not present

## 2023-01-13 DIAGNOSIS — N250 Renal osteodystrophy: Secondary | ICD-10-CM | POA: Diagnosis not present

## 2023-01-14 DIAGNOSIS — R29898 Other symptoms and signs involving the musculoskeletal system: Secondary | ICD-10-CM | POA: Diagnosis not present

## 2023-01-14 DIAGNOSIS — M4644 Discitis, unspecified, thoracic region: Secondary | ICD-10-CM | POA: Diagnosis not present

## 2023-01-14 DIAGNOSIS — N186 End stage renal disease: Secondary | ICD-10-CM | POA: Diagnosis not present

## 2023-01-14 DIAGNOSIS — T8463XD Infection and inflammatory reaction due to internal fixation device of spine, subsequent encounter: Secondary | ICD-10-CM | POA: Diagnosis not present

## 2023-01-14 DIAGNOSIS — M549 Dorsalgia, unspecified: Secondary | ICD-10-CM | POA: Diagnosis not present

## 2023-01-14 DIAGNOSIS — G8929 Other chronic pain: Secondary | ICD-10-CM | POA: Diagnosis not present

## 2023-01-14 DIAGNOSIS — M546 Pain in thoracic spine: Secondary | ICD-10-CM | POA: Diagnosis not present

## 2023-01-14 DIAGNOSIS — Z792 Long term (current) use of antibiotics: Secondary | ICD-10-CM | POA: Diagnosis not present

## 2023-01-15 DIAGNOSIS — N186 End stage renal disease: Secondary | ICD-10-CM | POA: Diagnosis not present

## 2023-01-15 DIAGNOSIS — M4644 Discitis, unspecified, thoracic region: Secondary | ICD-10-CM | POA: Diagnosis not present

## 2023-01-15 DIAGNOSIS — Z992 Dependence on renal dialysis: Secondary | ICD-10-CM | POA: Diagnosis not present

## 2023-01-17 DIAGNOSIS — N250 Renal osteodystrophy: Secondary | ICD-10-CM | POA: Diagnosis not present

## 2023-01-17 DIAGNOSIS — D509 Iron deficiency anemia, unspecified: Secondary | ICD-10-CM | POA: Diagnosis not present

## 2023-01-17 DIAGNOSIS — D631 Anemia in chronic kidney disease: Secondary | ICD-10-CM | POA: Diagnosis not present

## 2023-01-17 DIAGNOSIS — Z86711 Personal history of pulmonary embolism: Secondary | ICD-10-CM | POA: Diagnosis not present

## 2023-01-17 DIAGNOSIS — K219 Gastro-esophageal reflux disease without esophagitis: Secondary | ICD-10-CM | POA: Diagnosis not present

## 2023-01-17 DIAGNOSIS — J189 Pneumonia, unspecified organism: Secondary | ICD-10-CM | POA: Diagnosis not present

## 2023-01-17 DIAGNOSIS — M4644 Discitis, unspecified, thoracic region: Secondary | ICD-10-CM | POA: Diagnosis not present

## 2023-01-17 DIAGNOSIS — Z6841 Body Mass Index (BMI) 40.0 and over, adult: Secondary | ICD-10-CM | POA: Diagnosis not present

## 2023-01-17 DIAGNOSIS — M1A9XX0 Chronic gout, unspecified, without tophus (tophi): Secondary | ICD-10-CM | POA: Diagnosis not present

## 2023-01-17 DIAGNOSIS — G2581 Restless legs syndrome: Secondary | ICD-10-CM | POA: Diagnosis not present

## 2023-01-17 DIAGNOSIS — N186 End stage renal disease: Secondary | ICD-10-CM | POA: Diagnosis not present

## 2023-01-17 DIAGNOSIS — S22008D Other fracture of unspecified thoracic vertebra, subsequent encounter for fracture with routine healing: Secondary | ICD-10-CM | POA: Diagnosis not present

## 2023-01-17 DIAGNOSIS — E785 Hyperlipidemia, unspecified: Secondary | ICD-10-CM | POA: Diagnosis not present

## 2023-01-17 DIAGNOSIS — F32A Depression, unspecified: Secondary | ICD-10-CM | POA: Diagnosis not present

## 2023-01-17 DIAGNOSIS — I132 Hypertensive heart and chronic kidney disease with heart failure and with stage 5 chronic kidney disease, or end stage renal disease: Secondary | ICD-10-CM | POA: Diagnosis not present

## 2023-01-17 DIAGNOSIS — M4624 Osteomyelitis of vertebra, thoracic region: Secondary | ICD-10-CM | POA: Diagnosis not present

## 2023-01-17 DIAGNOSIS — J9612 Chronic respiratory failure with hypercapnia: Secondary | ICD-10-CM | POA: Diagnosis not present

## 2023-01-17 DIAGNOSIS — Z992 Dependence on renal dialysis: Secondary | ICD-10-CM | POA: Diagnosis not present

## 2023-01-19 DIAGNOSIS — I132 Hypertensive heart and chronic kidney disease with heart failure and with stage 5 chronic kidney disease, or end stage renal disease: Secondary | ICD-10-CM | POA: Diagnosis not present

## 2023-01-19 DIAGNOSIS — S22008D Other fracture of unspecified thoracic vertebra, subsequent encounter for fracture with routine healing: Secondary | ICD-10-CM | POA: Diagnosis not present

## 2023-01-19 DIAGNOSIS — J189 Pneumonia, unspecified organism: Secondary | ICD-10-CM | POA: Diagnosis not present

## 2023-01-19 DIAGNOSIS — M4644 Discitis, unspecified, thoracic region: Secondary | ICD-10-CM | POA: Diagnosis not present

## 2023-01-19 DIAGNOSIS — M4624 Osteomyelitis of vertebra, thoracic region: Secondary | ICD-10-CM | POA: Diagnosis not present

## 2023-01-19 DIAGNOSIS — J9612 Chronic respiratory failure with hypercapnia: Secondary | ICD-10-CM | POA: Diagnosis not present

## 2023-01-20 DIAGNOSIS — R0789 Other chest pain: Secondary | ICD-10-CM | POA: Diagnosis not present

## 2023-01-20 DIAGNOSIS — Z743 Need for continuous supervision: Secondary | ICD-10-CM | POA: Diagnosis not present

## 2023-01-20 DIAGNOSIS — N189 Chronic kidney disease, unspecified: Secondary | ICD-10-CM | POA: Diagnosis not present

## 2023-01-20 DIAGNOSIS — R0602 Shortness of breath: Secondary | ICD-10-CM | POA: Diagnosis not present

## 2023-01-20 DIAGNOSIS — J189 Pneumonia, unspecified organism: Secondary | ICD-10-CM | POA: Diagnosis not present

## 2023-01-20 DIAGNOSIS — D631 Anemia in chronic kidney disease: Secondary | ICD-10-CM | POA: Diagnosis not present

## 2023-01-20 DIAGNOSIS — Z992 Dependence on renal dialysis: Secondary | ICD-10-CM | POA: Diagnosis not present

## 2023-01-20 DIAGNOSIS — I509 Heart failure, unspecified: Secondary | ICD-10-CM | POA: Diagnosis not present

## 2023-01-20 DIAGNOSIS — J811 Chronic pulmonary edema: Secondary | ICD-10-CM | POA: Diagnosis not present

## 2023-01-20 DIAGNOSIS — J9611 Chronic respiratory failure with hypoxia: Secondary | ICD-10-CM | POA: Diagnosis not present

## 2023-01-20 DIAGNOSIS — R0989 Other specified symptoms and signs involving the circulatory and respiratory systems: Secondary | ICD-10-CM | POA: Diagnosis not present

## 2023-01-20 DIAGNOSIS — G2581 Restless legs syndrome: Secondary | ICD-10-CM | POA: Diagnosis not present

## 2023-01-20 DIAGNOSIS — E875 Hyperkalemia: Secondary | ICD-10-CM | POA: Diagnosis not present

## 2023-01-20 DIAGNOSIS — I132 Hypertensive heart and chronic kidney disease with heart failure and with stage 5 chronic kidney disease, or end stage renal disease: Secondary | ICD-10-CM | POA: Diagnosis not present

## 2023-01-20 DIAGNOSIS — I160 Hypertensive urgency: Secondary | ICD-10-CM | POA: Diagnosis not present

## 2023-01-20 DIAGNOSIS — M1A9XX0 Chronic gout, unspecified, without tophus (tophi): Secondary | ICD-10-CM | POA: Diagnosis not present

## 2023-01-20 DIAGNOSIS — I517 Cardiomegaly: Secondary | ICD-10-CM | POA: Diagnosis not present

## 2023-01-20 DIAGNOSIS — R531 Weakness: Secondary | ICD-10-CM | POA: Diagnosis not present

## 2023-01-20 DIAGNOSIS — N186 End stage renal disease: Secondary | ICD-10-CM | POA: Diagnosis not present

## 2023-01-20 DIAGNOSIS — E785 Hyperlipidemia, unspecified: Secondary | ICD-10-CM | POA: Diagnosis not present

## 2023-01-20 DIAGNOSIS — K219 Gastro-esophageal reflux disease without esophagitis: Secondary | ICD-10-CM | POA: Diagnosis not present

## 2023-01-20 DIAGNOSIS — I10 Essential (primary) hypertension: Secondary | ICD-10-CM | POA: Diagnosis not present

## 2023-01-21 ENCOUNTER — Other Ambulatory Visit: Payer: Self-pay

## 2023-01-21 DIAGNOSIS — I10 Essential (primary) hypertension: Secondary | ICD-10-CM

## 2023-01-22 ENCOUNTER — Telehealth: Payer: Self-pay | Admitting: *Deleted

## 2023-01-22 NOTE — Progress Notes (Signed)
  Care Coordination  Outreach Note  01/22/2023 Name: Christine Horn MRN: [id number] DOB: 17-Dec-1965   Care Coordination Outreach Attempts: An unsuccessful telephone outreach was attempted today to offer the patient information about available care coordination services.  Follow Up Plan:  Additional outreach attempts will be made to offer the patient care coordination information and services.   Encounter Outcome:  No Answer  Burman Nieves, CCMA Care Coordination Care Guide Direct Dial: [phone]

## 2023-01-23 NOTE — Progress Notes (Signed)
  Care Coordination   Note   01/23/2023 Name: Soyini Spickerman MRN: [id number] DOB: 02/09/1966  Alnora Libert is a 57 y.o. year old female who sees [name], [name], MD for primary care. I reached out to [name] by phone today to offer care coordination services.  Ms. Romans was given information about Care Coordination services today including:   The Care Coordination services include support from the care team which includes your Nurse Coordinator, Clinical Social Worker, or Pharmacist.  The Care Coordination team is here to help remove barriers to the health concerns and goals most important to you. Care Coordination services are voluntary, and the patient may decline or stop services at any time by request to their care team member.   Care Coordination Consent Status: Patient agreed to services and verbal consent obtained.   Follow up plan:  Home Visit appointment with care team member scheduled for:  01/28/2023  Encounter Outcome:  Pt. Scheduled from referral   Burman Nieves, [facility] Care Coordination Care Guide Direct Dial: [phone]

## 2023-01-24 DIAGNOSIS — D509 Iron deficiency anemia, unspecified: Secondary | ICD-10-CM | POA: Diagnosis not present

## 2023-01-24 DIAGNOSIS — N250 Renal osteodystrophy: Secondary | ICD-10-CM | POA: Diagnosis not present

## 2023-01-24 DIAGNOSIS — Z992 Dependence on renal dialysis: Secondary | ICD-10-CM | POA: Diagnosis not present

## 2023-01-24 DIAGNOSIS — N186 End stage renal disease: Secondary | ICD-10-CM | POA: Diagnosis not present

## 2023-01-24 DIAGNOSIS — D631 Anemia in chronic kidney disease: Secondary | ICD-10-CM | POA: Diagnosis not present

## 2023-01-26 DIAGNOSIS — I517 Cardiomegaly: Secondary | ICD-10-CM | POA: Diagnosis not present

## 2023-01-26 DIAGNOSIS — Z992 Dependence on renal dialysis: Secondary | ICD-10-CM | POA: Diagnosis not present

## 2023-01-26 DIAGNOSIS — J189 Pneumonia, unspecified organism: Secondary | ICD-10-CM | POA: Diagnosis not present

## 2023-01-26 DIAGNOSIS — M549 Dorsalgia, unspecified: Secondary | ICD-10-CM | POA: Diagnosis not present

## 2023-01-26 DIAGNOSIS — I120 Hypertensive chronic kidney disease with stage 5 chronic kidney disease or end stage renal disease: Secondary | ICD-10-CM | POA: Diagnosis not present

## 2023-01-26 DIAGNOSIS — R918 Other nonspecific abnormal finding of lung field: Secondary | ICD-10-CM | POA: Diagnosis not present

## 2023-01-26 DIAGNOSIS — R0989 Other specified symptoms and signs involving the circulatory and respiratory systems: Secondary | ICD-10-CM | POA: Diagnosis not present

## 2023-01-26 DIAGNOSIS — R531 Weakness: Secondary | ICD-10-CM | POA: Diagnosis not present

## 2023-01-26 DIAGNOSIS — I10 Essential (primary) hypertension: Secondary | ICD-10-CM | POA: Diagnosis not present

## 2023-01-26 DIAGNOSIS — R109 Unspecified abdominal pain: Secondary | ICD-10-CM | POA: Diagnosis not present

## 2023-01-26 DIAGNOSIS — R000 Tachycardia, unspecified: Secondary | ICD-10-CM | POA: Diagnosis not present

## 2023-01-26 DIAGNOSIS — Z7401 Bed confinement status: Secondary | ICD-10-CM | POA: Diagnosis not present

## 2023-01-26 DIAGNOSIS — S20212A Contusion of left front wall of thorax, initial encounter: Secondary | ICD-10-CM | POA: Diagnosis not present

## 2023-01-26 DIAGNOSIS — W19XXXA Unspecified fall, initial encounter: Secondary | ICD-10-CM | POA: Diagnosis not present

## 2023-01-26 DIAGNOSIS — N186 End stage renal disease: Secondary | ICD-10-CM | POA: Diagnosis not present

## 2023-01-26 DIAGNOSIS — Z981 Arthrodesis status: Secondary | ICD-10-CM | POA: Diagnosis not present

## 2023-01-26 DIAGNOSIS — J811 Chronic pulmonary edema: Secondary | ICD-10-CM | POA: Diagnosis not present

## 2023-01-27 DIAGNOSIS — J189 Pneumonia, unspecified organism: Secondary | ICD-10-CM | POA: Diagnosis not present

## 2023-01-27 DIAGNOSIS — M4644 Discitis, unspecified, thoracic region: Secondary | ICD-10-CM | POA: Diagnosis not present

## 2023-01-27 DIAGNOSIS — S22008D Other fracture of unspecified thoracic vertebra, subsequent encounter for fracture with routine healing: Secondary | ICD-10-CM | POA: Diagnosis not present

## 2023-01-27 DIAGNOSIS — J9612 Chronic respiratory failure with hypercapnia: Secondary | ICD-10-CM | POA: Diagnosis not present

## 2023-01-27 DIAGNOSIS — M4624 Osteomyelitis of vertebra, thoracic region: Secondary | ICD-10-CM | POA: Diagnosis not present

## 2023-01-27 DIAGNOSIS — I132 Hypertensive heart and chronic kidney disease with heart failure and with stage 5 chronic kidney disease, or end stage renal disease: Secondary | ICD-10-CM | POA: Diagnosis not present

## 2023-01-28 ENCOUNTER — Ambulatory Visit: Payer: Self-pay

## 2023-01-28 ENCOUNTER — Encounter: Payer: Self-pay | Admitting: *Deleted

## 2023-01-28 ENCOUNTER — Ambulatory Visit: Payer: Self-pay | Admitting: *Deleted

## 2023-01-28 DIAGNOSIS — N186 End stage renal disease: Secondary | ICD-10-CM | POA: Diagnosis not present

## 2023-01-28 DIAGNOSIS — Z992 Dependence on renal dialysis: Secondary | ICD-10-CM | POA: Diagnosis not present

## 2023-01-28 NOTE — Patient Outreach (Signed)
Care Coordination   Home Visit Note   01/28/2023 Name: Christine Horn MRN: [id number] DOB: [birth year]  Christine Horn is a 57 y.o. year old female who sees [name], [name], MD for primary care. I visited  [name] in their home today.  What matters to the patients health and wellness today?  Pt has become homebound/immobile over the past 3 months due to pain in back and acute onset paralysis of bilateral legs. She is isolated/depressed,frustrated with the pain and immobility (was driving herself a few months ago). She is struggling to get to her HS 3 days per week as it is a 45 minute drive and she has to use wheelchair Christine Horn (paying $200 each day with the help of family/friends but cannot continue with this expense). Pt admits to depression and is open to trying an anti-depressant.    Goals Addressed             This Visit's Progress    Need help with resources and caregiver support and depression       Activities and task to complete in order to accomplish goals.   Go online to [email or web address].[place]OV as discussed or go to Department of Social Services to apply for [employer] and [employer] Stamps Call your BCBS/Kidney Foundation covered insurance provider for more information about your Enhanced Benefits (?transportation, caregiver support,etc  Consider using [employer] for local transports  Call 988 as discussed for 24/7 crisis line to speak with counselor Start / continue relaxed breathing 3 times daily Continue with compliance of taking medication prescribed by Doctor Review private pay home care options provided and discussed         SDOH assessments and interventions completed:  Yes  SDOH Interventions Today    Flowsheet Row Most Recent Value  SDOH Interventions   Housing Interventions Intervention Not Indicated  Transportation Interventions Patient Resources (Friends/Family), Other (Comment)  [Pt has family assisting with cost of wheelchair Christine Horn transport to/from HD on T/TH/Sat in  [place] which has been costing $200 each way which they cannot afford.]  Alcohol Usage Interventions Intervention Not Indicated (Score <7)  Financial Strain Interventions Other (Comment)  [CSW to research Medicaid eligbility]  Physical Activity Interventions Intervention Not Indicated, Other (Comments)  [Pt has been unable to bear weight due to feeling paralyzed for last 46months]  Stress Interventions Provide Counseling, Other (Comment)  [request medication from PCP]  Social Connections Interventions Other (Comment)        Care Coordination Interventions:  Yes, provided  Interventions Today    Flowsheet Row Most Recent Value  Chronic Disease   Chronic disease during today's visit Chronic Kidney Disease/End Stage Renal Disease (ESRD), Other  [depression]  General Interventions   General Interventions Discussed/Reviewed General Interventions Discussed, General Interventions Reviewed, Durable Medical Equipment (DME), [employer], Doctor Visits, Communication with, Level of Care  [Discussed possible community resource support: RCATS, Medicaid, RCS, etc]  Horticulturist, commercial (DME) Oxygen  Level of Care Personal Care Services, Skilled Nursing Facility, Public librarian Medicaid  [Suggested they apply online for Medicaid asap- based on what pt has been told she may be eligible for Medicaid based on her  income and out of pocket medical expenses]  Exercise Interventions   Exercise Discussed/Reviewed Exercise Discussed  [Pt receiving HHPT (once weekly) and indicates it is very limited d/t pain and her paralyzed legs]  Education Interventions   Education Provided Provided Education  Provided Verbal Education On Mental Health/Coping with Illness, Applications, Medication, [employer], [employer]  [  Feels isolated/frustrated and tired of the pain.May be eligible for Medicaid which would offer transportation, PCS,etc.]  Applications Medicaid  [Suggested they apply  online for Medicaid asap- based on what pt has been told she may be eligible for Medicaid based on her  income and out of pocket medical expenses]  Mental Health Interventions   Mental Health Discussed/Reviewed --  [Pt advised to call 988 for 24/7 mental health counselor support and 911 for any emergent needs]  Safety Interventions   Safety Discussed/Reviewed Home Safety  [Pt is home alone some and acknowledges the safety risk- she has extended family who call and check in while husband  is at work. Discussed benefits of Life Alert type button.]  Home Safety Refer for community resources  Advanced Directive Interventions   Advanced Directives Discussed/Reviewed Advanced Directives Discussed, Provided resource for acquiring and filling out documents  [Pt would like to complete this and we will mail packet to her for completion and  signing in front of a Notary.]       Follow up plan: Follow up call scheduled for 02/02/23    Encounter Outcome:  Pt. Visit Completed

## 2023-01-28 NOTE — Patient Outreach (Signed)
Care Coordination   Initial Visit Note   01/28/2023 Name: Christine Horn MRN: [id number] DOB: 04-19-1966  Christine Horn is a 57 y.o. year old female who sees Christine Horn, Christine Coffee, MD for primary care. I visited  Christine Horn in their home today. Joint visit with Christine Horn social worker, Christine Horn                            What matters to the patients health and wellness today?  Patient reports she has severe back pain that affects her going to dialysis.  Reports that 3-4 months ago she became paralyzed and unable to walk. Prior to this she was diving herself to dialysis and able to self care. Now patient is in a Horn bed, uses a lift to go from bed to recliner.  Patient reports that change in mobility and back pain has not evaluated.  Patient reports poor quality of life, depression and severe pain. Patient reports that she goes to dialysis in [place] 45 minutes away and wants to go to a place in [place] that is 20 minutes away. Patient reports that she has to pay out of pocket for transportation  200.00 per day ; 3 days per week [employer].     Reports she is picked up at 5am for chair time at 615 am and finishes treatment at 1015 am.  Patient reports that she takes ibuprofen and tylenol for pain prior to going to dialysis.  Reports that when she misses dialysis that it is because of pain 80% of the time.  Reports she is not able to go to the pain clinic due to transportation and lack of mobility.  Currently active with home health nurse, OT from [facility].  Reports being depressed. Reports she was on Cymbalta at rehab but no RX when discharged.  Patient currently on oxygen due to Covid pneumonia 3 years ago and scarring of her lungs.   Patient concerns:  no diagnosis for reason of paralysis, back pain, muscle spasms, need a  caregiver in the home, needs affordable transportation, wants to change to a closer dialysis center ( in [place]).  Husband states that he can not provide  the care patient needs.    Goals Addressed             This Visit's Progress    I am having terrible pain.       Interventions Today    Flowsheet Row Most Recent Value  Chronic Disease   Chronic disease during today's visit Other, Chronic Kidney Disease/End Stage Renal Disease (ESRD)  [pain]  General Interventions   General Interventions Discussed/Reviewed General Interventions Discussed, Labs, Durable Medical Equipment (DME), Doctor Visits, Communication with, Level of Care  [placed call to MD office.  Spoke with Christine Horn]  Doctor Visits Discussed/Reviewed Doctor Visits Discussed  Durable Medical Equipment (DME) Oxygen  Communication with PCP/Specialists  Level of Care Personal Care Services, Skilled Nursing Facility  [Discussed with patient during home visit]  Education Interventions   Education Provided Provided Education  Provided Verbal Education On Exercise, Medication, When to see the doctor  Mental Health Interventions   Mental Health Discussed/Reviewed Depression  Nutrition Interventions   Nutrition Discussed/Reviewed Nutrition Discussed  Pharmacy Interventions   Pharmacy Dicussed/Reviewed Medications and their functions, Pharmacy Topics Reviewed  Safety Interventions   Safety Discussed/Reviewed Fall Risk, Home Safety  Advanced Directive Interventions   Advanced Directives Discussed/Reviewed Advanced Directives Discussed  Call to MD office and spoke with Christine Horn briefly due to poor reception after home visit. Email to Dr. Abner Greenspan with concerns for barrier and to inquire about direction.   Rowe Pavy, RN, BSN, CEN [facility] Coordinator [phone]         SDOH assessments and interventions completed:  Yes     Care Coordination Interventions:  Yes, provided   Follow up plan:  to be determined.     Encounter Outcome:  Pt. Visit Completed   Rowe Pavy, RN, BSN, CEN [facility] Coordinator [phone]

## 2023-01-28 NOTE — Patient Instructions (Signed)
Visit Information  Thank you for taking time to visit with me today. Please don't hesitate to contact me if I can be of assistance to you.   Following are the goals we discussed today:   Goals Addressed             This Visit's Progress    Need help with resources and caregiver support and depression       Activities and task to complete in order to accomplish goals.   Go online to [email or web address] as discussed or go to Department of Social Services to apply for [employer] and Food Stamps Call your BCBS/Kidney Foundation covered insurance provider for more information about your Enhanced Benefits (?transportation, caregiver support,etc  Consider using [employer] for local transports  Call 988 as discussed for 24/7 crisis line to speak with counselor Start / continue relaxed breathing 3 times daily Continue with compliance of taking medication prescribed by Doctor Review private pay home care options provided and discussed         Our next appointment is by telephone on 02/02/23  Please call the care guide team at [phone] if you need to cancel or reschedule your appointment.   If you are experiencing a Mental Health or Behavioral Health Crisis or need someone to talk to, please call the Suicide and Crisis Lifeline: 988 call 911   The patient verbalized understanding of instructions, educational materials, and care plan provided today and DECLINED offer to receive copy of patient instructions, educational materials, and care plan.   Telephone follow up appointment with care management team member scheduled for: 02/02/23  Reece Levy, MSW, LCSW Clinical Social Worker [facility] [phone]

## 2023-01-29 ENCOUNTER — Telehealth: Payer: Self-pay

## 2023-01-29 DIAGNOSIS — I10 Essential (primary) hypertension: Secondary | ICD-10-CM | POA: Diagnosis not present

## 2023-01-29 DIAGNOSIS — J9612 Chronic respiratory failure with hypercapnia: Secondary | ICD-10-CM | POA: Diagnosis not present

## 2023-01-29 DIAGNOSIS — N186 End stage renal disease: Secondary | ICD-10-CM | POA: Diagnosis not present

## 2023-01-29 DIAGNOSIS — J9610 Chronic respiratory failure, unspecified whether with hypoxia or hypercapnia: Secondary | ICD-10-CM | POA: Diagnosis not present

## 2023-01-29 DIAGNOSIS — S22008D Other fracture of unspecified thoracic vertebra, subsequent encounter for fracture with routine healing: Secondary | ICD-10-CM | POA: Diagnosis not present

## 2023-01-29 DIAGNOSIS — D509 Iron deficiency anemia, unspecified: Secondary | ICD-10-CM | POA: Diagnosis not present

## 2023-01-29 DIAGNOSIS — J189 Pneumonia, unspecified organism: Secondary | ICD-10-CM | POA: Diagnosis not present

## 2023-01-29 DIAGNOSIS — M4644 Discitis, unspecified, thoracic region: Secondary | ICD-10-CM | POA: Diagnosis not present

## 2023-01-29 DIAGNOSIS — D631 Anemia in chronic kidney disease: Secondary | ICD-10-CM | POA: Diagnosis not present

## 2023-01-29 DIAGNOSIS — Z992 Dependence on renal dialysis: Secondary | ICD-10-CM | POA: Diagnosis not present

## 2023-01-29 DIAGNOSIS — I132 Hypertensive heart and chronic kidney disease with heart failure and with stage 5 chronic kidney disease, or end stage renal disease: Secondary | ICD-10-CM | POA: Diagnosis not present

## 2023-01-29 DIAGNOSIS — M4624 Osteomyelitis of vertebra, thoracic region: Secondary | ICD-10-CM | POA: Diagnosis not present

## 2023-01-29 NOTE — Patient Outreach (Signed)
  Care Coordination   Follow Up Visit Note   01/29/2023 Name: Tyshawn Mcelderry MRN: [id number] DOB: 02/16/1966  Luther Mosher is a 57 y.o. year old female who sees [name], [name], MD for primary care. I  placed call to MD office and left a message for Hilda Lias   What matters to the patients health and wellness today?  Placed call to MD and left a message for office  manager to call back.       SDOH assessments and interventions completed:  No     Care Coordination Interventions:  Yes, provided   Follow up plan:  awaiting a call back.     Encounter Outcome:  Pt. Visit Completed   Rowe Pavy, RN, BSN, CEN [facility] Coordinator [phone]

## 2023-02-02 ENCOUNTER — Telehealth: Payer: Self-pay

## 2023-02-02 ENCOUNTER — Ambulatory Visit: Payer: Self-pay | Admitting: *Deleted

## 2023-02-02 DIAGNOSIS — M4624 Osteomyelitis of vertebra, thoracic region: Secondary | ICD-10-CM | POA: Diagnosis not present

## 2023-02-02 DIAGNOSIS — S22008D Other fracture of unspecified thoracic vertebra, subsequent encounter for fracture with routine healing: Secondary | ICD-10-CM | POA: Diagnosis not present

## 2023-02-02 DIAGNOSIS — I132 Hypertensive heart and chronic kidney disease with heart failure and with stage 5 chronic kidney disease, or end stage renal disease: Secondary | ICD-10-CM | POA: Diagnosis not present

## 2023-02-02 DIAGNOSIS — M4644 Discitis, unspecified, thoracic region: Secondary | ICD-10-CM | POA: Diagnosis not present

## 2023-02-02 DIAGNOSIS — J9612 Chronic respiratory failure with hypercapnia: Secondary | ICD-10-CM | POA: Diagnosis not present

## 2023-02-02 DIAGNOSIS — J189 Pneumonia, unspecified organism: Secondary | ICD-10-CM | POA: Diagnosis not present

## 2023-02-02 NOTE — Patient Instructions (Signed)
Visit Information  Thank you for taking time to visit with me today. Please don't hesitate to contact me if I can be of assistance to you.   Following are the goals we discussed today:   Goals Addressed             This Visit's Progress    Need help with resources and caregiver support and depression       Activities and task to complete in order to accomplish goals.   Go online to [email or web address] as discussed or go to Department of Social Services to apply for [employer] and Food Stamps Call your BCBS/Kidney Foundation covered insurance provider for more information about your Enhanced Benefits (?transportation, caregiver support,etc  Consider using [employer]ATS for local transports  Call 988 as discussed for 24/7 crisis line to speak with counselor Start / continue relaxed breathing 3 times daily Continue with compliance of taking medication prescribed by Doctor Review private pay home care options provided and discussed I will work with PCP to complete paperwork to begin SNF rehab search         Our next appointment is by telephone on 02/04/23   Please call the care guide team at [phone] if you need to cancel or reschedule your appointment.   If you are experiencing a Mental Health or Behavioral Health Crisis or need someone to talk to, please call the Suicide and Crisis Lifeline: 988 call 911   The patient verbalized understanding of instructions, educational materials, and care plan provided today and DECLINED offer to receive copy of patient instructions, educational materials, and care plan.   Telephone follow up appointment with care management team member scheduled for:02/04/23  Reece Levy, MSW, LCSW Clinical Social Worker [facility] [phone]

## 2023-02-02 NOTE — Telephone Encounter (Signed)
Transition Care Management Follow-up Telephone Call Date of discharge and from where: 01/26/2023 [facility] How have you been since you were released from the hospital? Patient is still in a lot of pain. Any questions or concerns? No  Items Reviewed: Did the pt receive and understand the discharge instructions provided? Yes  Medications obtained and verified? Yes  Other? No  Any new allergies since your discharge? No  Dietary orders reviewed? Yes Do you have support at home? Yes   Follow up appointments reviewed:  PCP Hospital f/u appt confirmed?  Patient is being assisted by [facility] Care Coordination referred by her PCP.  Scheduled to see Reece Levy, LCSW on 01/28/2023 @ televisit. Specialist Hospital f/u appt confirmed? No  Scheduled to see  on  @ . Are transportation arrangements needed? No  If their condition worsens, is the pt aware to call PCP or go to the Emergency Dept.? Yes Was the patient provided with contact information for the PCP's office or ED? Yes Was to pt encouraged to call back with questions or concerns? Yes   Shar[facility]  [facility] Population Health Community Resource Care Guide   ??[email or web address]  ?? [phone]   Website: [email or web address]  [email or web address]

## 2023-02-02 NOTE — Patient Outreach (Signed)
  Care Coordination   Follow Up Visit Note   02/02/2023 Name: Christine Horn MRN: [id number] DOB: 1965/10/12  Christine Horn is a 57 y.o. year old female who sees [name], [name], MD for primary care. I spoke with  [name] by phone today.  What matters to the patients health and wellness today? Overall need for help with self care, rehab, pain etc     Goals Addressed             This Visit's Progress    Need help with resources and caregiver support and depression       Activities and task to complete in order to accomplish goals.   Go online to [email or web address] as discussed or go to Department of Social Services to apply for [employer] and [employer] Stamps Call your BCBS/Kidney Foundation covered insurance provider for more information about your Enhanced Benefits (?transportation, caregiver support,etc  Consider using [employer]ATS for local transports  Call 988 as discussed for 24/7 crisis line to speak with counselor Start / continue relaxed breathing 3 times daily Continue with compliance of taking medication prescribed by Doctor Review private pay home care options provided and discussed I will work with PCP to complete paperwork to begin SNF rehab search         SDOH assessments and interventions completed:  Yes     Care Coordination Interventions:  Yes, provided  Interventions Today    Flowsheet Row Most Recent Value  Chronic Disease   Chronic disease during today's visit Chronic Kidney Disease/End Stage Renal Disease (ESRD), Other  [chronic pain. depression]  General Interventions   General Interventions Discussed/Reviewed [employer], Communication with, Level of Care  [CSW discussed possible SNF placement for rehabilitation purposes. CSW contacted HHPT who indicates they are seeing her once weekly and are limited (space/equipment wise). Pt does agree to SNF rehab]  Level of Care Skilled Nursing Facility  Applications [place], [place]  [Pt and husband have started the  online Medicaid application. CSW will work with PCP for [employer] completion-]  Exercise Interventions   Exercise Discussed/Reviewed Exercise Discussed  [Pt receiving HHPT 1x week currently]  Education Interventions   Provided Verbal Education On Mental Health/Coping with Illness  [Pt tearful on phone- stated pain/nausea]  Applications [place], [place]  [Pt and husband have started the online Medicaid application. CSW will work with PCP for [facility] completion-]  Mental Health Interventions   Mental Health Discussed/Reviewed Coping Strategies       Follow up plan: Follow up call scheduled for 02/04/23    Encounter Outcome:  Pt. Visit Completed

## 2023-02-02 NOTE — Patient Outreach (Signed)
  Care Coordination   Follow Up Visit Note   02/02/2023 Name: Kolina Meeuwsen MRN: [id number] DOB: 07/10/1965  Erisha Martinell is a 57 y.o. year old female who sees [name], [name], MD for primary care. I spoke with  [name] by phone today.  What matters to the patients health and wellness today?  Placed call to patient today to inquire about pending pain clinic appointment.  Patient reports that she goes to [facility] beside of Dr. Yetta Flock office.  Reports home health nurse made appointment for 02/26/2023.  Patient went to the ED recently due to pain. Patient reports that she has 2 oxycodone left, pregablin, phenergan and tylenol. Home health nurse  with nurse during this call.    Goals Addressed             This Visit's Progress    I am having terrible pain.       Interventions Today    Flowsheet Row Most Recent Value  Chronic Disease   Chronic disease during today's visit Other, Chronic Kidney Disease/End Stage Renal Disease (ESRD)  [pain]  General Interventions   General Interventions Discussed/Reviewed General Interventions Reviewed  Doctor Visits Discussed/Reviewed Doctor Visits Discussed  Communication with Social Work, PCP/Specialists  [placed call to the pain center to inquire about sooner appointment.]  Education Interventions   Education Provided Provided Education  [reviewed pain control options with patient. Marland Kitchen]      Appointment secured for patient in 3 days at 1:30   02/05/2023.  Patient informed and she will call transportation. If patient has a transportation problem she stated she would call me back.         SDOH assessments and interventions completed:  No     Care Coordination Interventions:  Yes, provided   Follow up plan: Follow up call scheduled for 02/09/2023    Encounter Outcome:  Pt. Visit Completed   Rowe Pavy, RN, BSN, CEN [facility] Coordinator [phone]

## 2023-02-03 DIAGNOSIS — D509 Iron deficiency anemia, unspecified: Secondary | ICD-10-CM | POA: Diagnosis not present

## 2023-02-03 DIAGNOSIS — Z992 Dependence on renal dialysis: Secondary | ICD-10-CM | POA: Diagnosis not present

## 2023-02-03 DIAGNOSIS — D631 Anemia in chronic kidney disease: Secondary | ICD-10-CM | POA: Diagnosis not present

## 2023-02-03 DIAGNOSIS — N186 End stage renal disease: Secondary | ICD-10-CM | POA: Diagnosis not present

## 2023-02-04 ENCOUNTER — Ambulatory Visit: Payer: Self-pay | Admitting: *Deleted

## 2023-02-04 DIAGNOSIS — M4644 Discitis, unspecified, thoracic region: Secondary | ICD-10-CM | POA: Diagnosis not present

## 2023-02-04 DIAGNOSIS — I132 Hypertensive heart and chronic kidney disease with heart failure and with stage 5 chronic kidney disease, or end stage renal disease: Secondary | ICD-10-CM | POA: Diagnosis not present

## 2023-02-04 DIAGNOSIS — M4624 Osteomyelitis of vertebra, thoracic region: Secondary | ICD-10-CM | POA: Diagnosis not present

## 2023-02-04 DIAGNOSIS — S22008D Other fracture of unspecified thoracic vertebra, subsequent encounter for fracture with routine healing: Secondary | ICD-10-CM | POA: Diagnosis not present

## 2023-02-04 DIAGNOSIS — J9612 Chronic respiratory failure with hypercapnia: Secondary | ICD-10-CM | POA: Diagnosis not present

## 2023-02-04 DIAGNOSIS — J189 Pneumonia, unspecified organism: Secondary | ICD-10-CM | POA: Diagnosis not present

## 2023-02-05 NOTE — Patient Instructions (Signed)
Visit Information  Thank you for taking time to visit with me today. Please don't hesitate to contact me if I can be of assistance to you.   Following are the goals we discussed today:   Goals Addressed             This Visit's Progress    Need help with resources and caregiver support and depression       Activities and task to complete in order to accomplish goals.   Go online to EPASS.GOV as discussed or go to Department of Social Services to apply for [employer] and Food Stamps Call your BCBS/Kidney Foundation covered insurance provider for more information about your Enhanced Benefits (?transportation, caregiver support,etc  Consider using [employer] for local transports  Call 988 as discussed for 24/7 crisis line to speak with counselor Start / continue relaxed breathing 3 times daily Continue with compliance of taking medication prescribed by Doctor Review private pay home care options provided and discussed I will work with PCP to complete paperwork to begin SNF rehab search         Our next appointment is by telephone on 02/09/23   Please call the care guide team at [phone] if you need to cancel or reschedule your appointment.   If you are experiencing a Mental Health or Behavioral Health Crisis or need someone to talk to, please call the Suicide and Crisis Lifeline: 988 call 911   The patient verbalized understanding of instructions, educational materials, and care plan provided today and DECLINED offer to receive copy of patient instructions, educational materials, and care plan.   Telephone follow up appointment with care management team member scheduled for:02/09/23  Reece Levy, MSW, LCSW Clinical Social Worker [facility] [phone]

## 2023-02-05 NOTE — Patient Outreach (Signed)
  Care Coordination   Follow Up Visit Note  Late Entry 02/05/2023 Name: Christine Horn MRN: [id number] DOB: [birth year]  Christine Horn is a 57 y.o. year old female who sees [name], [name], MD for primary care. I spoke with  Christine Horn by phone today.  What matters to the patients health and wellness today?  Pt called CSW to ask if she could be placed at SNF rehab in [place], [place].    Goals Addressed             This Visit's Progress    Need help with resources and caregiver support and depression       Activities and task to complete in order to accomplish goals.   Go online to [email or web address] as discussed or go to Department of Social Services to apply for [employer] Stamps Call your BC[employer] covered insurance provider for more information about your Enhanced Benefits (?transportation, caregiver support,etc  Consider using [employer] for local transports  Call 988 as discussed for 24/7 crisis line to speak with counselor Start / continue relaxed breathing 3 times daily Continue with compliance of taking medication prescribed by Doctor Review private pay home care options provided and discussed I will work with PCP to complete paperwork to begin SNF rehab search         SDOH assessments and interventions completed:  Yes     Care Coordination Interventions:  Yes, provided  Interventions Today    Flowsheet Row Most Recent Value  Chronic Disease   Chronic disease during today's visit Chronic Kidney Disease/End Stage Renal Disease (ESRD)  General Interventions   General Interventions Discussed/Reviewed Level of Care, [facility] called asking if she can go to SNF rehab in [place] [facility] where her mother and other family live- advised pt will have to check insurance coverage and options]  Communication with PCP/Specialists  [[facility] and indicates plans to see if can be done virtually- encouraged pt to not miss this  appointment if at all possible]  Level of Care Skilled Nursing Facility  [Pt is eligible to use the SNF waiver for [facility] eligible SNF's which is limited in pt's county/vicinity. Also, will have to get SNF to accept HD pt and she will have to have her HD clinic changed to be close to the SNF where she resides....]  Applications Medicaid  [Husband to complete Medicaid app and advise CSW]  Education Interventions   Applications Medicaid  [Husband to complete Medicaid app and advise CSW]  Mental Health Interventions   Mental Health Discussed/Reviewed Coping Strategies       Follow up plan: Follow up call scheduled for 02/09/23    Encounter Outcome:  Pt. Visit Completed

## 2023-02-06 DIAGNOSIS — I959 Hypotension, unspecified: Secondary | ICD-10-CM | POA: Diagnosis not present

## 2023-02-06 DIAGNOSIS — K219 Gastro-esophageal reflux disease without esophagitis: Secondary | ICD-10-CM | POA: Diagnosis not present

## 2023-02-06 DIAGNOSIS — J9690 Respiratory failure, unspecified, unspecified whether with hypoxia or hypercapnia: Secondary | ICD-10-CM | POA: Diagnosis not present

## 2023-02-06 DIAGNOSIS — R069 Unspecified abnormalities of breathing: Secondary | ICD-10-CM | POA: Diagnosis not present

## 2023-02-06 DIAGNOSIS — K921 Melena: Secondary | ICD-10-CM | POA: Diagnosis not present

## 2023-02-06 DIAGNOSIS — Z741 Need for assistance with personal care: Secondary | ICD-10-CM | POA: Diagnosis not present

## 2023-02-06 DIAGNOSIS — M109 Gout, unspecified: Secondary | ICD-10-CM | POA: Diagnosis not present

## 2023-02-06 DIAGNOSIS — D649 Anemia, unspecified: Secondary | ICD-10-CM | POA: Diagnosis not present

## 2023-02-06 DIAGNOSIS — M4854XA Collapsed vertebra, not elsewhere classified, thoracic region, initial encounter for fracture: Secondary | ICD-10-CM | POA: Diagnosis not present

## 2023-02-06 DIAGNOSIS — Z9981 Dependence on supplemental oxygen: Secondary | ICD-10-CM | POA: Diagnosis not present

## 2023-02-06 DIAGNOSIS — M546 Pain in thoracic spine: Secondary | ICD-10-CM | POA: Diagnosis not present

## 2023-02-06 DIAGNOSIS — K625 Hemorrhage of anus and rectum: Secondary | ICD-10-CM | POA: Diagnosis not present

## 2023-02-06 DIAGNOSIS — I129 Hypertensive chronic kidney disease with stage 1 through stage 4 chronic kidney disease, or unspecified chronic kidney disease: Secondary | ICD-10-CM | POA: Diagnosis not present

## 2023-02-06 DIAGNOSIS — Z79899 Other long term (current) drug therapy: Secondary | ICD-10-CM | POA: Diagnosis not present

## 2023-02-06 DIAGNOSIS — J90 Pleural effusion, not elsewhere classified: Secondary | ICD-10-CM | POA: Diagnosis not present

## 2023-02-06 DIAGNOSIS — G4700 Insomnia, unspecified: Secondary | ICD-10-CM | POA: Diagnosis not present

## 2023-02-06 DIAGNOSIS — G2581 Restless legs syndrome: Secondary | ICD-10-CM | POA: Diagnosis not present

## 2023-02-06 DIAGNOSIS — I953 Hypotension of hemodialysis: Secondary | ICD-10-CM | POA: Diagnosis not present

## 2023-02-06 DIAGNOSIS — G9341 Metabolic encephalopathy: Secondary | ICD-10-CM | POA: Diagnosis not present

## 2023-02-06 DIAGNOSIS — R41841 Cognitive communication deficit: Secondary | ICD-10-CM | POA: Diagnosis not present

## 2023-02-06 DIAGNOSIS — J811 Chronic pulmonary edema: Secondary | ICD-10-CM | POA: Diagnosis not present

## 2023-02-06 DIAGNOSIS — F32A Depression, unspecified: Secondary | ICD-10-CM | POA: Diagnosis not present

## 2023-02-06 DIAGNOSIS — L8943 Pressure ulcer of contiguous site of back, buttock and hip, stage 3: Secondary | ICD-10-CM | POA: Diagnosis not present

## 2023-02-06 DIAGNOSIS — M62838 Other muscle spasm: Secondary | ICD-10-CM | POA: Diagnosis not present

## 2023-02-06 DIAGNOSIS — R278 Other lack of coordination: Secondary | ICD-10-CM | POA: Diagnosis not present

## 2023-02-06 DIAGNOSIS — E8729 Other acidosis: Secondary | ICD-10-CM | POA: Diagnosis not present

## 2023-02-06 DIAGNOSIS — Z515 Encounter for palliative care: Secondary | ICD-10-CM | POA: Diagnosis not present

## 2023-02-06 DIAGNOSIS — A0472 Enterocolitis due to Clostridium difficile, not specified as recurrent: Secondary | ICD-10-CM | POA: Diagnosis not present

## 2023-02-06 DIAGNOSIS — R109 Unspecified abdominal pain: Secondary | ICD-10-CM | POA: Diagnosis not present

## 2023-02-06 DIAGNOSIS — J9621 Acute and chronic respiratory failure with hypoxia: Secondary | ICD-10-CM | POA: Diagnosis not present

## 2023-02-06 DIAGNOSIS — M545 Low back pain, unspecified: Secondary | ICD-10-CM | POA: Diagnosis not present

## 2023-02-06 DIAGNOSIS — F339 Major depressive disorder, recurrent, unspecified: Secondary | ICD-10-CM | POA: Diagnosis not present

## 2023-02-06 DIAGNOSIS — M4644 Discitis, unspecified, thoracic region: Secondary | ICD-10-CM | POA: Diagnosis not present

## 2023-02-06 DIAGNOSIS — M4807 Spinal stenosis, lumbosacral region: Secondary | ICD-10-CM | POA: Diagnosis not present

## 2023-02-06 DIAGNOSIS — I517 Cardiomegaly: Secondary | ICD-10-CM | POA: Diagnosis not present

## 2023-02-06 DIAGNOSIS — M6281 Muscle weakness (generalized): Secondary | ICD-10-CM | POA: Diagnosis not present

## 2023-02-06 DIAGNOSIS — E875 Hyperkalemia: Secondary | ICD-10-CM | POA: Diagnosis not present

## 2023-02-06 DIAGNOSIS — S22069A Unspecified fracture of T7-T8 vertebra, initial encounter for closed fracture: Secondary | ICD-10-CM | POA: Diagnosis not present

## 2023-02-06 DIAGNOSIS — D62 Acute posthemorrhagic anemia: Secondary | ICD-10-CM | POA: Diagnosis not present

## 2023-02-06 DIAGNOSIS — J9622 Acute and chronic respiratory failure with hypercapnia: Secondary | ICD-10-CM | POA: Diagnosis not present

## 2023-02-06 DIAGNOSIS — K626 Ulcer of anus and rectum: Secondary | ICD-10-CM | POA: Diagnosis not present

## 2023-02-06 DIAGNOSIS — D128 Benign neoplasm of rectum: Secondary | ICD-10-CM | POA: Diagnosis not present

## 2023-02-06 DIAGNOSIS — Z888 Allergy status to other drugs, medicaments and biological substances status: Secondary | ICD-10-CM | POA: Diagnosis not present

## 2023-02-06 DIAGNOSIS — R918 Other nonspecific abnormal finding of lung field: Secondary | ICD-10-CM | POA: Diagnosis not present

## 2023-02-06 DIAGNOSIS — K266 Chronic or unspecified duodenal ulcer with both hemorrhage and perforation: Secondary | ICD-10-CM | POA: Diagnosis not present

## 2023-02-06 DIAGNOSIS — K922 Gastrointestinal hemorrhage, unspecified: Secondary | ICD-10-CM | POA: Diagnosis not present

## 2023-02-06 DIAGNOSIS — M549 Dorsalgia, unspecified: Secondary | ICD-10-CM | POA: Diagnosis not present

## 2023-02-06 DIAGNOSIS — L89223 Pressure ulcer of left hip, stage 3: Secondary | ICD-10-CM | POA: Diagnosis not present

## 2023-02-06 DIAGNOSIS — R339 Retention of urine, unspecified: Secondary | ICD-10-CM | POA: Diagnosis not present

## 2023-02-06 DIAGNOSIS — Z9049 Acquired absence of other specified parts of digestive tract: Secondary | ICD-10-CM | POA: Diagnosis not present

## 2023-02-06 DIAGNOSIS — R0989 Other specified symptoms and signs involving the circulatory and respiratory systems: Secondary | ICD-10-CM | POA: Diagnosis not present

## 2023-02-06 DIAGNOSIS — R1011 Right upper quadrant pain: Secondary | ICD-10-CM | POA: Diagnosis not present

## 2023-02-06 DIAGNOSIS — S22060D Wedge compression fracture of T7-T8 vertebra, subsequent encounter for fracture with routine healing: Secondary | ICD-10-CM | POA: Diagnosis not present

## 2023-02-06 DIAGNOSIS — R0902 Hypoxemia: Secondary | ICD-10-CM | POA: Diagnosis not present

## 2023-02-06 DIAGNOSIS — Z7951 Long term (current) use of inhaled steroids: Secondary | ICD-10-CM | POA: Diagnosis not present

## 2023-02-06 DIAGNOSIS — R000 Tachycardia, unspecified: Secondary | ICD-10-CM | POA: Diagnosis not present

## 2023-02-06 DIAGNOSIS — M4856XA Collapsed vertebra, not elsewhere classified, lumbar region, initial encounter for fracture: Secondary | ICD-10-CM | POA: Diagnosis not present

## 2023-02-06 DIAGNOSIS — M4804 Spinal stenosis, thoracic region: Secondary | ICD-10-CM | POA: Diagnosis not present

## 2023-02-06 DIAGNOSIS — R079 Chest pain, unspecified: Secondary | ICD-10-CM | POA: Diagnosis not present

## 2023-02-06 DIAGNOSIS — Z6841 Body Mass Index (BMI) 40.0 and over, adult: Secondary | ICD-10-CM | POA: Diagnosis not present

## 2023-02-06 DIAGNOSIS — R0602 Shortness of breath: Secondary | ICD-10-CM | POA: Diagnosis not present

## 2023-02-06 DIAGNOSIS — R112 Nausea with vomiting, unspecified: Secondary | ICD-10-CM | POA: Diagnosis not present

## 2023-02-06 DIAGNOSIS — R21 Rash and other nonspecific skin eruption: Secondary | ICD-10-CM | POA: Diagnosis not present

## 2023-02-06 DIAGNOSIS — L89323 Pressure ulcer of left buttock, stage 3: Secondary | ICD-10-CM | POA: Diagnosis not present

## 2023-02-06 DIAGNOSIS — R1312 Dysphagia, oropharyngeal phase: Secondary | ICD-10-CM | POA: Diagnosis not present

## 2023-02-06 DIAGNOSIS — Z7189 Other specified counseling: Secondary | ICD-10-CM | POA: Diagnosis not present

## 2023-02-06 DIAGNOSIS — N186 End stage renal disease: Secondary | ICD-10-CM | POA: Diagnosis not present

## 2023-02-06 DIAGNOSIS — I132 Hypertensive heart and chronic kidney disease with heart failure and with stage 5 chronic kidney disease, or end stage renal disease: Secondary | ICD-10-CM | POA: Diagnosis not present

## 2023-02-06 DIAGNOSIS — I10 Essential (primary) hypertension: Secondary | ICD-10-CM | POA: Diagnosis not present

## 2023-02-06 DIAGNOSIS — I5033 Acute on chronic diastolic (congestive) heart failure: Secondary | ICD-10-CM | POA: Diagnosis not present

## 2023-02-06 DIAGNOSIS — M4624 Osteomyelitis of vertebra, thoracic region: Secondary | ICD-10-CM | POA: Diagnosis not present

## 2023-02-06 DIAGNOSIS — K5900 Constipation, unspecified: Secondary | ICD-10-CM | POA: Diagnosis not present

## 2023-02-06 DIAGNOSIS — L89322 Pressure ulcer of left buttock, stage 2: Secondary | ICD-10-CM | POA: Diagnosis not present

## 2023-02-06 DIAGNOSIS — D631 Anemia in chronic kidney disease: Secondary | ICD-10-CM | POA: Diagnosis not present

## 2023-02-06 DIAGNOSIS — R609 Edema, unspecified: Secondary | ICD-10-CM | POA: Diagnosis not present

## 2023-02-06 DIAGNOSIS — J9610 Chronic respiratory failure, unspecified whether with hypoxia or hypercapnia: Secondary | ICD-10-CM | POA: Diagnosis not present

## 2023-02-06 DIAGNOSIS — Z981 Arthrodesis status: Secondary | ICD-10-CM | POA: Diagnosis not present

## 2023-02-06 DIAGNOSIS — Z992 Dependence on renal dialysis: Secondary | ICD-10-CM | POA: Diagnosis not present

## 2023-02-07 DIAGNOSIS — Z992 Dependence on renal dialysis: Secondary | ICD-10-CM | POA: Diagnosis not present

## 2023-02-07 DIAGNOSIS — J9610 Chronic respiratory failure, unspecified whether with hypoxia or hypercapnia: Secondary | ICD-10-CM | POA: Diagnosis not present

## 2023-02-07 DIAGNOSIS — N186 End stage renal disease: Secondary | ICD-10-CM | POA: Diagnosis not present

## 2023-02-07 DIAGNOSIS — J9690 Respiratory failure, unspecified, unspecified whether with hypoxia or hypercapnia: Secondary | ICD-10-CM | POA: Diagnosis not present

## 2023-02-07 DIAGNOSIS — E875 Hyperkalemia: Secondary | ICD-10-CM | POA: Diagnosis not present

## 2023-02-07 DIAGNOSIS — J811 Chronic pulmonary edema: Secondary | ICD-10-CM | POA: Diagnosis not present

## 2023-02-07 DIAGNOSIS — I517 Cardiomegaly: Secondary | ICD-10-CM | POA: Diagnosis not present

## 2023-02-07 DIAGNOSIS — M4644 Discitis, unspecified, thoracic region: Secondary | ICD-10-CM | POA: Diagnosis not present

## 2023-02-07 DIAGNOSIS — R339 Retention of urine, unspecified: Secondary | ICD-10-CM | POA: Diagnosis not present

## 2023-02-07 DIAGNOSIS — S22069A Unspecified fracture of T7-T8 vertebra, initial encounter for closed fracture: Secondary | ICD-10-CM | POA: Diagnosis not present

## 2023-02-07 DIAGNOSIS — E8729 Other acidosis: Secondary | ICD-10-CM | POA: Diagnosis not present

## 2023-02-07 DIAGNOSIS — G9341 Metabolic encephalopathy: Secondary | ICD-10-CM | POA: Diagnosis not present

## 2023-02-07 DIAGNOSIS — Z6841 Body Mass Index (BMI) 40.0 and over, adult: Secondary | ICD-10-CM | POA: Diagnosis not present

## 2023-02-07 DIAGNOSIS — I10 Essential (primary) hypertension: Secondary | ICD-10-CM | POA: Diagnosis not present

## 2023-02-08 DIAGNOSIS — N186 End stage renal disease: Secondary | ICD-10-CM | POA: Diagnosis not present

## 2023-02-08 DIAGNOSIS — E875 Hyperkalemia: Secondary | ICD-10-CM | POA: Diagnosis not present

## 2023-02-08 DIAGNOSIS — Z992 Dependence on renal dialysis: Secondary | ICD-10-CM | POA: Diagnosis not present

## 2023-02-09 ENCOUNTER — Ambulatory Visit: Payer: Self-pay | Admitting: *Deleted

## 2023-02-09 ENCOUNTER — Telehealth: Payer: Self-pay

## 2023-02-09 DIAGNOSIS — Z992 Dependence on renal dialysis: Secondary | ICD-10-CM | POA: Diagnosis not present

## 2023-02-09 DIAGNOSIS — N186 End stage renal disease: Secondary | ICD-10-CM | POA: Diagnosis not present

## 2023-02-09 NOTE — Patient Outreach (Signed)
  Care Coordination   Case discussion  Visit Note   02/09/2023 Name: Janaisa Bieser MRN: [id number] DOB: 28-Apr-1966  Karly Reuss is a 57 y.o. year old female who sees [name], [name], MD for primary care. I  spoke with Reece Levy LCSW.    What matters to the patients health and wellness today?  Reviewed recent admission and transfer to [facility].  Review outside chart. Message sent to care team including Dr. Abner Greenspan to advise of admission and transfer.      SDOH assessments and interventions completed:  No     Care Coordination Interventions:  Yes, provided   Interventions Today    Flowsheet Row Most Recent Value  General Interventions   Communication with PCP/Specialists  [Secured email message sent to Dr, Abner Greenspan and office manager to update on patient's admission and MRI report.]        Follow up plan:  pending discharge    Encounter Outcome:  Pt. Visit Completed    Rowe Pavy, RN, BSN, CEN [facility] Coordinator [phone]

## 2023-02-09 NOTE — Patient Instructions (Signed)
Visit Information  Thank you for taking time to visit with me today. Please don't hesitate to contact me if I can be of assistance to you.   Following are the goals we discussed today:   Goals Addressed             This Visit's Progress    Need help with resources and caregiver support and depression       Activities and task to complete in order to accomplish goals.   Reminder to go online to [email or web address] or go to Department of Social Services to apply for Medicaid and [employer] with hospital staff/discharge planner about need for Skilled facility placement         Our next appointment is by telephone on 02/11/23  Please call the care guide team at [phone] if you need to cancel or reschedule your appointment.   If you are experiencing a Mental Health or Behavioral Health Crisis or need someone to talk to, please call the Suicide and Crisis Lifeline: 988 call 911   The patient verbalized understanding of instructions, educational materials, and care plan provided today and DECLINED offer to receive copy of patient instructions, educational materials, and care plan.   Telephone follow up appointment with care management team member scheduled for: 02/11/23  Reece Levy, MSW, LCSW Clinical Social Worker [facility] [phone]

## 2023-02-10 DIAGNOSIS — Z992 Dependence on renal dialysis: Secondary | ICD-10-CM | POA: Diagnosis not present

## 2023-02-10 DIAGNOSIS — N186 End stage renal disease: Secondary | ICD-10-CM | POA: Diagnosis not present

## 2023-02-11 DIAGNOSIS — Z981 Arthrodesis status: Secondary | ICD-10-CM | POA: Diagnosis not present

## 2023-02-11 DIAGNOSIS — M4804 Spinal stenosis, thoracic region: Secondary | ICD-10-CM | POA: Diagnosis not present

## 2023-02-11 DIAGNOSIS — M546 Pain in thoracic spine: Secondary | ICD-10-CM | POA: Diagnosis not present

## 2023-02-11 DIAGNOSIS — S22069A Unspecified fracture of T7-T8 vertebra, initial encounter for closed fracture: Secondary | ICD-10-CM | POA: Diagnosis not present

## 2023-02-11 DIAGNOSIS — N186 End stage renal disease: Secondary | ICD-10-CM | POA: Diagnosis not present

## 2023-02-11 DIAGNOSIS — Z992 Dependence on renal dialysis: Secondary | ICD-10-CM | POA: Diagnosis not present

## 2023-02-16 ENCOUNTER — Ambulatory Visit: Payer: Self-pay | Admitting: *Deleted

## 2023-02-16 NOTE — Patient Outreach (Signed)
  Care Coordination   Follow Up Visit Note   02/16/2023 Name: Christine Horn MRN: [id number] DOB: Mar 07, 1966  Christine Horn is a 57 y.o. year old female who sees [name], [name], MD for primary care. I  spoke with pt's husband by phone today.  What matters to the patients health and wellness today?  Pt remains at hospital in [facility]- husband indicates they are trying to get some sort of pain regiment to reduce her pain level to hopefully help her to be able to participate better and have less pain; and thus, be able to do SNF rehab.    SDOH assessments and interventions completed:  Yes     Care Coordination Interventions:  Yes, provided   Interventions Today    Flowsheet Row Most Recent Value  General Interventions   Level of Care Applications  [Pt remains at [facility]. Per husband, are getting CAPS paperwork for her to complete for possible options for support at home. Currently she is too much pain to participate in high level (SNF) rehab- hoping for pain management plans.]       Follow up plan: Follow up call scheduled for 02/20/23    Encounter Outcome:  Pt. Visit Completed

## 2023-02-20 ENCOUNTER — Ambulatory Visit: Payer: Self-pay | Admitting: *Deleted

## 2023-02-25 ENCOUNTER — Ambulatory Visit: Payer: Self-pay | Admitting: *Deleted

## 2023-02-25 NOTE — Patient Outreach (Signed)
  Care Coordination   Follow Up Visit Note   02/25/2023 Name: Shewanda Cerf MRN: [id number] DOB: 1966-01-18  Tyresha Badenhop is a 57 y.o. year old female who sees [name], [name], MD for primary care. I spoke with  [name] by phone today.  What matters to the patients health and wellness today?  SNF placement.    Goals Addressed   None     SDOH assessments and interventions completed:  Yes     Care Coordination Interventions:  Yes, provided  Interventions Today    Flowsheet Row Most Recent Value  Chronic Disease   Chronic disease during today's visit Chronic Kidney Disease/End Stage Renal Disease (ESRD)  General Interventions   General Interventions Discussed/Reviewed Level of Care  Level of Care Skilled Nursing Facility, Applications  [Contact made with Amy, Discharge Planner at [facility], today who indicates they are awaiting Medicaid approval prior to SNF move as she has exhausted her Medicare SNF days and will have a $200/day copay at SNF.]  Applications Medicaid  [Per Amy, Discharge Planner at [facility], Medicaid application is complete and their in-house Medicaid Specialist is working to hopefully expedite this asap.]  Education Interventions   Applications Medicaid  [Per Amy, Discharge Planner at [facility], [place] application is complete and their in-house Medicaid Specialist is working to hopefully expedite this asap.]       Follow up plan: Follow up call scheduled for 03/03/23    Encounter Outcome:  Pt. Visit Completed

## 2023-02-25 NOTE — Patient Instructions (Signed)
Visit Information  Thank you for taking time to visit with me today. Please don't hesitate to contact me if I can be of assistance to you.   Following are the goals we discussed today:   Goals Addressed             This Visit's Progress    Need help with resources and caregiver support and depression       Activities and task to complete in order to accomplish goals.   Medicaid application is pending Continue to seek SNF rehab with the assistance of hospital staff/discharge planner         Our next appointment is by telephone on 03/03/23 Please call the care guide team at [phone] if you need to cancel or reschedule your appointment.   If you are experiencing a Mental Health or Behavioral Health Crisis or need someone to talk to, please call the Suicide and Crisis Lifeline: 988 call 911   The patient verbalized understanding of instructions, educational materials, and care plan provided today and DECLINED offer to receive copy of patient instructions, educational materials, and care plan.   Telephone follow up appointment with care management team member scheduled for:03/03/23  Reece Levy, MSW, LCSW Clinical Social Worker [phone]

## 2023-03-01 DIAGNOSIS — J9610 Chronic respiratory failure, unspecified whether with hypoxia or hypercapnia: Secondary | ICD-10-CM | POA: Diagnosis not present

## 2023-03-01 DIAGNOSIS — N186 End stage renal disease: Secondary | ICD-10-CM | POA: Diagnosis not present

## 2023-03-01 DIAGNOSIS — I10 Essential (primary) hypertension: Secondary | ICD-10-CM | POA: Diagnosis not present

## 2023-03-03 ENCOUNTER — Ambulatory Visit: Payer: Self-pay | Admitting: *Deleted

## 2023-03-03 NOTE — Patient Outreach (Addendum)
  Care Coordination   Follow Up Visit Note   03/03/2023 Name: Christine Horn MRN: [id number] DOB: [birth year]  Christine Horn is a 57 y.o. year old female who sees [name], [name], MD for primary care. I  spoke with pt's husband by phone   What matters to the patients health and wellness today?  Pt is still in hospital- awaits SNF rehab. Currently in a procedure to determine internal bleeding.     Goals Addressed             This Visit's Progress    Need help with resources and caregiver support and depression       Activities and task to complete in order to accomplish goals.   Medicaid application is pending Continue to seek SNF rehab and HD site with the assistance of hospital staff/discharge planner         SDOH assessments and interventions completed:  Yes     Care Coordination Interventions:  Yes, provided  Interventions Today    Flowsheet Row Most Recent Value  Chronic Disease   Chronic disease during today's visit Chronic Kidney Disease/End Stage Renal Disease (ESRD), Other  [Husband reports pt now has internal bleeding and is currently in procedure to assess-]  General Interventions   General Interventions Discussed/Reviewed Level of Care  [Pt remains in hospital at [facility]- plan is for SNF rehab]  Applications Medicaid  [Medicaid is pending]  Education Interventions   Applications Medicaid  [Medicaid is pending]  Mental Health Interventions   Mental Health Discussed/Reviewed Coping Strategies, Other  [Spoke with pt's husband- offered him support- acknowledges he is "tired".]       Follow up plan: Follow up call scheduled for 03/10/23    Encounter Outcome:  Pt. Visit Completed

## 2023-03-10 ENCOUNTER — Ambulatory Visit: Payer: Self-pay | Admitting: *Deleted

## 2023-03-11 NOTE — Patient Outreach (Signed)
  Care Coordination   Follow Up Visit Note   03/11/2023 Name: Christine Horn MRN: [id number] DOB: Jul 19, 1965  Christine Horn is a 57 y.o. year old female who sees [name], [name], MD for primary care. I  communicated with [facility] staff.  What matters to the patients health and wellness today?  SNF rehab needed.    Goals Addressed             This Visit's Progress    Need help with resources and caregiver support and depression       Activities and task to complete in order to accomplish goals.   Medicaid application is pending Planning for transfer to [place], [place] SNF rehab and HD arranged as well         SDOH assessments and interventions completed:  Yes     Care Coordination Interventions:  Yes, provided  Interventions Today    Flowsheet Row Most Recent Value  Chronic Disease   Chronic disease during today's visit Chronic Kidney Disease/End Stage Renal Disease (ESRD)  General Interventions   General Interventions Discussed/Reviewed [facility], Level of Care  Level of Care Skilled Nursing Facility  [Per hospital D/C Planner, pt will transfer 03/12/23 to [facility] SNF for rehab and HD has been arranged at [facility], [place], for HD on M/W/F]  Applications Medicaid  [pending]  Education Interventions   Applications Medicaid  [pending]       Follow up plan: Follow up call scheduled for 03/17/23    Encounter Outcome:  Patient Visit Completed

## 2023-03-12 DIAGNOSIS — M8448XA Pathological fracture, other site, initial encounter for fracture: Secondary | ICD-10-CM | POA: Diagnosis present

## 2023-03-12 DIAGNOSIS — I081 Rheumatic disorders of both mitral and tricuspid valves: Secondary | ICD-10-CM | POA: Diagnosis present

## 2023-03-12 DIAGNOSIS — R112 Nausea with vomiting, unspecified: Secondary | ICD-10-CM | POA: Diagnosis not present

## 2023-03-12 DIAGNOSIS — J440 Chronic obstructive pulmonary disease with acute lower respiratory infection: Secondary | ICD-10-CM | POA: Diagnosis present

## 2023-03-12 DIAGNOSIS — R41841 Cognitive communication deficit: Secondary | ICD-10-CM | POA: Diagnosis not present

## 2023-03-12 DIAGNOSIS — R339 Retention of urine, unspecified: Secondary | ICD-10-CM | POA: Diagnosis not present

## 2023-03-12 DIAGNOSIS — N186 End stage renal disease: Secondary | ICD-10-CM | POA: Diagnosis present

## 2023-03-12 DIAGNOSIS — Z9981 Dependence on supplemental oxygen: Secondary | ICD-10-CM | POA: Diagnosis not present

## 2023-03-12 DIAGNOSIS — D631 Anemia in chronic kidney disease: Secondary | ICD-10-CM | POA: Diagnosis not present

## 2023-03-12 DIAGNOSIS — I132 Hypertensive heart and chronic kidney disease with heart failure and with stage 5 chronic kidney disease, or end stage renal disease: Secondary | ICD-10-CM | POA: Diagnosis present

## 2023-03-12 DIAGNOSIS — D72829 Elevated white blood cell count, unspecified: Secondary | ICD-10-CM | POA: Diagnosis not present

## 2023-03-12 DIAGNOSIS — G8220 Paraplegia, unspecified: Secondary | ICD-10-CM | POA: Diagnosis present

## 2023-03-12 DIAGNOSIS — Z992 Dependence on renal dialysis: Secondary | ICD-10-CM | POA: Diagnosis not present

## 2023-03-12 DIAGNOSIS — S22069A Unspecified fracture of T7-T8 vertebra, initial encounter for closed fracture: Secondary | ICD-10-CM | POA: Diagnosis not present

## 2023-03-12 DIAGNOSIS — A0472 Enterocolitis due to Clostridium difficile, not specified as recurrent: Secondary | ICD-10-CM | POA: Diagnosis not present

## 2023-03-12 DIAGNOSIS — N2581 Secondary hyperparathyroidism of renal origin: Secondary | ICD-10-CM | POA: Diagnosis not present

## 2023-03-12 DIAGNOSIS — Z79899 Other long term (current) drug therapy: Secondary | ICD-10-CM | POA: Diagnosis not present

## 2023-03-12 DIAGNOSIS — S22060D Wedge compression fracture of T7-T8 vertebra, subsequent encounter for fracture with routine healing: Secondary | ICD-10-CM | POA: Diagnosis not present

## 2023-03-12 DIAGNOSIS — L89322 Pressure ulcer of left buttock, stage 2: Secondary | ICD-10-CM | POA: Diagnosis not present

## 2023-03-12 DIAGNOSIS — F32A Depression, unspecified: Secondary | ICD-10-CM | POA: Diagnosis not present

## 2023-03-12 DIAGNOSIS — K219 Gastro-esophageal reflux disease without esophagitis: Secondary | ICD-10-CM | POA: Diagnosis present

## 2023-03-12 DIAGNOSIS — Z7189 Other specified counseling: Secondary | ICD-10-CM | POA: Diagnosis not present

## 2023-03-12 DIAGNOSIS — F419 Anxiety disorder, unspecified: Secondary | ICD-10-CM | POA: Diagnosis present

## 2023-03-12 DIAGNOSIS — M4644 Discitis, unspecified, thoracic region: Secondary | ICD-10-CM | POA: Diagnosis not present

## 2023-03-12 DIAGNOSIS — R1312 Dysphagia, oropharyngeal phase: Secondary | ICD-10-CM | POA: Diagnosis not present

## 2023-03-12 DIAGNOSIS — R531 Weakness: Secondary | ICD-10-CM | POA: Diagnosis not present

## 2023-03-12 DIAGNOSIS — Z5982 Transportation insecurity: Secondary | ICD-10-CM | POA: Diagnosis not present

## 2023-03-12 DIAGNOSIS — D509 Iron deficiency anemia, unspecified: Secondary | ICD-10-CM | POA: Diagnosis not present

## 2023-03-12 DIAGNOSIS — J9621 Acute and chronic respiratory failure with hypoxia: Secondary | ICD-10-CM | POA: Diagnosis present

## 2023-03-12 DIAGNOSIS — R509 Fever, unspecified: Secondary | ICD-10-CM | POA: Diagnosis not present

## 2023-03-12 DIAGNOSIS — I5031 Acute diastolic (congestive) heart failure: Secondary | ICD-10-CM | POA: Diagnosis present

## 2023-03-12 DIAGNOSIS — G4733 Obstructive sleep apnea (adult) (pediatric): Secondary | ICD-10-CM | POA: Diagnosis present

## 2023-03-12 DIAGNOSIS — G4700 Insomnia, unspecified: Secondary | ICD-10-CM | POA: Diagnosis not present

## 2023-03-12 DIAGNOSIS — K625 Hemorrhage of anus and rectum: Secondary | ICD-10-CM | POA: Diagnosis not present

## 2023-03-12 DIAGNOSIS — F339 Major depressive disorder, recurrent, unspecified: Secondary | ICD-10-CM | POA: Diagnosis not present

## 2023-03-12 DIAGNOSIS — E8770 Fluid overload, unspecified: Secondary | ICD-10-CM | POA: Diagnosis not present

## 2023-03-12 DIAGNOSIS — I272 Pulmonary hypertension, unspecified: Secondary | ICD-10-CM | POA: Diagnosis present

## 2023-03-12 DIAGNOSIS — R278 Other lack of coordination: Secondary | ICD-10-CM | POA: Diagnosis not present

## 2023-03-12 DIAGNOSIS — L8943 Pressure ulcer of contiguous site of back, buttock and hip, stage 3: Secondary | ICD-10-CM | POA: Diagnosis not present

## 2023-03-12 DIAGNOSIS — M109 Gout, unspecified: Secondary | ICD-10-CM | POA: Diagnosis not present

## 2023-03-12 DIAGNOSIS — G2581 Restless legs syndrome: Secondary | ICD-10-CM | POA: Diagnosis not present

## 2023-03-12 DIAGNOSIS — Z933 Colostomy status: Secondary | ICD-10-CM | POA: Diagnosis not present

## 2023-03-12 DIAGNOSIS — M62838 Other muscle spasm: Secondary | ICD-10-CM | POA: Diagnosis not present

## 2023-03-12 DIAGNOSIS — M6281 Muscle weakness (generalized): Secondary | ICD-10-CM | POA: Diagnosis not present

## 2023-03-12 DIAGNOSIS — I959 Hypotension, unspecified: Secondary | ICD-10-CM | POA: Diagnosis present

## 2023-03-12 DIAGNOSIS — K922 Gastrointestinal hemorrhage, unspecified: Secondary | ICD-10-CM | POA: Diagnosis not present

## 2023-03-12 DIAGNOSIS — J9601 Acute respiratory failure with hypoxia: Secondary | ICD-10-CM | POA: Diagnosis not present

## 2023-03-12 DIAGNOSIS — R4182 Altered mental status, unspecified: Secondary | ICD-10-CM | POA: Diagnosis not present

## 2023-03-12 DIAGNOSIS — M549 Dorsalgia, unspecified: Secondary | ICD-10-CM | POA: Diagnosis not present

## 2023-03-12 DIAGNOSIS — Z741 Need for assistance with personal care: Secondary | ICD-10-CM | POA: Diagnosis not present

## 2023-03-12 DIAGNOSIS — K626 Ulcer of anus and rectum: Secondary | ICD-10-CM | POA: Diagnosis present

## 2023-03-12 DIAGNOSIS — J9622 Acute and chronic respiratory failure with hypercapnia: Secondary | ICD-10-CM | POA: Diagnosis present

## 2023-03-12 DIAGNOSIS — M545 Low back pain, unspecified: Secondary | ICD-10-CM | POA: Diagnosis not present

## 2023-03-12 DIAGNOSIS — J150 Pneumonia due to Klebsiella pneumoniae: Secondary | ICD-10-CM | POA: Diagnosis present

## 2023-03-12 DIAGNOSIS — G9341 Metabolic encephalopathy: Secondary | ICD-10-CM | POA: Diagnosis present

## 2023-03-12 DIAGNOSIS — R410 Disorientation, unspecified: Secondary | ICD-10-CM | POA: Diagnosis not present

## 2023-03-12 DIAGNOSIS — N3000 Acute cystitis without hematuria: Secondary | ICD-10-CM | POA: Diagnosis present

## 2023-03-13 DIAGNOSIS — D631 Anemia in chronic kidney disease: Secondary | ICD-10-CM | POA: Diagnosis not present

## 2023-03-13 DIAGNOSIS — D509 Iron deficiency anemia, unspecified: Secondary | ICD-10-CM | POA: Diagnosis not present

## 2023-03-13 DIAGNOSIS — N186 End stage renal disease: Secondary | ICD-10-CM | POA: Diagnosis not present

## 2023-03-13 DIAGNOSIS — Z992 Dependence on renal dialysis: Secondary | ICD-10-CM | POA: Diagnosis not present

## 2023-03-13 DIAGNOSIS — N2581 Secondary hyperparathyroidism of renal origin: Secondary | ICD-10-CM | POA: Diagnosis not present

## 2023-03-15 DIAGNOSIS — R112 Nausea with vomiting, unspecified: Secondary | ICD-10-CM | POA: Diagnosis not present

## 2023-03-15 DIAGNOSIS — J9622 Acute and chronic respiratory failure with hypercapnia: Secondary | ICD-10-CM | POA: Diagnosis not present

## 2023-03-15 DIAGNOSIS — Z741 Need for assistance with personal care: Secondary | ICD-10-CM | POA: Diagnosis not present

## 2023-03-15 DIAGNOSIS — I081 Rheumatic disorders of both mitral and tricuspid valves: Secondary | ICD-10-CM | POA: Diagnosis not present

## 2023-03-15 DIAGNOSIS — K219 Gastro-esophageal reflux disease without esophagitis: Secondary | ICD-10-CM | POA: Diagnosis not present

## 2023-03-15 DIAGNOSIS — J9601 Acute respiratory failure with hypoxia: Secondary | ICD-10-CM | POA: Diagnosis not present

## 2023-03-15 DIAGNOSIS — D72829 Elevated white blood cell count, unspecified: Secondary | ICD-10-CM | POA: Diagnosis not present

## 2023-03-15 DIAGNOSIS — R509 Fever, unspecified: Secondary | ICD-10-CM | POA: Diagnosis not present

## 2023-03-15 DIAGNOSIS — G2581 Restless legs syndrome: Secondary | ICD-10-CM | POA: Diagnosis not present

## 2023-03-15 DIAGNOSIS — R2689 Other abnormalities of gait and mobility: Secondary | ICD-10-CM | POA: Diagnosis not present

## 2023-03-15 DIAGNOSIS — R339 Retention of urine, unspecified: Secondary | ICD-10-CM | POA: Diagnosis not present

## 2023-03-15 DIAGNOSIS — E8770 Fluid overload, unspecified: Secondary | ICD-10-CM | POA: Diagnosis not present

## 2023-03-15 DIAGNOSIS — Z933 Colostomy status: Secondary | ICD-10-CM | POA: Diagnosis not present

## 2023-03-15 DIAGNOSIS — I272 Pulmonary hypertension, unspecified: Secondary | ICD-10-CM | POA: Diagnosis not present

## 2023-03-15 DIAGNOSIS — I5031 Acute diastolic (congestive) heart failure: Secondary | ICD-10-CM | POA: Diagnosis not present

## 2023-03-15 DIAGNOSIS — K626 Ulcer of anus and rectum: Secondary | ICD-10-CM | POA: Diagnosis not present

## 2023-03-15 DIAGNOSIS — N186 End stage renal disease: Secondary | ICD-10-CM | POA: Diagnosis not present

## 2023-03-15 DIAGNOSIS — R410 Disorientation, unspecified: Secondary | ICD-10-CM | POA: Diagnosis not present

## 2023-03-15 DIAGNOSIS — K625 Hemorrhage of anus and rectum: Secondary | ICD-10-CM | POA: Diagnosis not present

## 2023-03-15 DIAGNOSIS — R1314 Dysphagia, pharyngoesophageal phase: Secondary | ICD-10-CM | POA: Diagnosis not present

## 2023-03-15 DIAGNOSIS — S22060D Wedge compression fracture of T7-T8 vertebra, subsequent encounter for fracture with routine healing: Secondary | ICD-10-CM | POA: Diagnosis not present

## 2023-03-15 DIAGNOSIS — M545 Low back pain, unspecified: Secondary | ICD-10-CM | POA: Diagnosis not present

## 2023-03-15 DIAGNOSIS — I132 Hypertensive heart and chronic kidney disease with heart failure and with stage 5 chronic kidney disease, or end stage renal disease: Secondary | ICD-10-CM | POA: Diagnosis not present

## 2023-03-15 DIAGNOSIS — J9621 Acute and chronic respiratory failure with hypoxia: Secondary | ICD-10-CM | POA: Diagnosis present

## 2023-03-15 DIAGNOSIS — N3000 Acute cystitis without hematuria: Secondary | ICD-10-CM | POA: Diagnosis not present

## 2023-03-15 DIAGNOSIS — M6281 Muscle weakness (generalized): Secondary | ICD-10-CM | POA: Diagnosis not present

## 2023-03-15 DIAGNOSIS — M109 Gout, unspecified: Secondary | ICD-10-CM | POA: Diagnosis not present

## 2023-03-15 DIAGNOSIS — Z992 Dependence on renal dialysis: Secondary | ICD-10-CM | POA: Diagnosis not present

## 2023-03-15 DIAGNOSIS — F339 Major depressive disorder, recurrent, unspecified: Secondary | ICD-10-CM | POA: Diagnosis not present

## 2023-03-15 DIAGNOSIS — R4182 Altered mental status, unspecified: Secondary | ICD-10-CM | POA: Diagnosis not present

## 2023-03-15 DIAGNOSIS — G8220 Paraplegia, unspecified: Secondary | ICD-10-CM | POA: Diagnosis not present

## 2023-03-15 DIAGNOSIS — F419 Anxiety disorder, unspecified: Secondary | ICD-10-CM | POA: Diagnosis not present

## 2023-03-15 DIAGNOSIS — I959 Hypotension, unspecified: Secondary | ICD-10-CM | POA: Diagnosis not present

## 2023-03-15 DIAGNOSIS — G9341 Metabolic encephalopathy: Secondary | ICD-10-CM | POA: Diagnosis not present

## 2023-03-15 DIAGNOSIS — R41841 Cognitive communication deficit: Secondary | ICD-10-CM | POA: Diagnosis not present

## 2023-03-15 DIAGNOSIS — Z79899 Other long term (current) drug therapy: Secondary | ICD-10-CM | POA: Diagnosis not present

## 2023-03-15 DIAGNOSIS — R1312 Dysphagia, oropharyngeal phase: Secondary | ICD-10-CM | POA: Diagnosis not present

## 2023-03-15 DIAGNOSIS — M62838 Other muscle spasm: Secondary | ICD-10-CM | POA: Diagnosis not present

## 2023-03-15 DIAGNOSIS — Z9981 Dependence on supplemental oxygen: Secondary | ICD-10-CM | POA: Diagnosis not present

## 2023-03-15 DIAGNOSIS — J440 Chronic obstructive pulmonary disease with acute lower respiratory infection: Secondary | ICD-10-CM | POA: Diagnosis not present

## 2023-03-15 DIAGNOSIS — L89322 Pressure ulcer of left buttock, stage 2: Secondary | ICD-10-CM | POA: Diagnosis not present

## 2023-03-15 DIAGNOSIS — M4644 Discitis, unspecified, thoracic region: Secondary | ICD-10-CM | POA: Diagnosis not present

## 2023-03-15 DIAGNOSIS — G4733 Obstructive sleep apnea (adult) (pediatric): Secondary | ICD-10-CM | POA: Diagnosis not present

## 2023-03-15 DIAGNOSIS — J150 Pneumonia due to Klebsiella pneumoniae: Secondary | ICD-10-CM | POA: Diagnosis not present

## 2023-03-15 DIAGNOSIS — D631 Anemia in chronic kidney disease: Secondary | ICD-10-CM | POA: Diagnosis not present

## 2023-03-15 DIAGNOSIS — A0472 Enterocolitis due to Clostridium difficile, not specified as recurrent: Secondary | ICD-10-CM | POA: Diagnosis not present

## 2023-03-15 DIAGNOSIS — F32A Depression, unspecified: Secondary | ICD-10-CM | POA: Diagnosis not present

## 2023-03-15 DIAGNOSIS — M8448XA Pathological fracture, other site, initial encounter for fracture: Secondary | ICD-10-CM | POA: Diagnosis not present

## 2023-03-15 DIAGNOSIS — R531 Weakness: Secondary | ICD-10-CM | POA: Diagnosis not present

## 2023-03-15 DIAGNOSIS — G4700 Insomnia, unspecified: Secondary | ICD-10-CM | POA: Diagnosis not present

## 2023-03-15 DIAGNOSIS — Z5982 Transportation insecurity: Secondary | ICD-10-CM | POA: Diagnosis not present

## 2023-03-15 DIAGNOSIS — R278 Other lack of coordination: Secondary | ICD-10-CM | POA: Diagnosis not present

## 2023-03-16 DIAGNOSIS — I959 Hypotension, unspecified: Secondary | ICD-10-CM | POA: Diagnosis not present

## 2023-03-16 DIAGNOSIS — J9601 Acute respiratory failure with hypoxia: Secondary | ICD-10-CM | POA: Diagnosis not present

## 2023-03-17 ENCOUNTER — Ambulatory Visit: Payer: Self-pay | Admitting: *Deleted

## 2023-03-17 DIAGNOSIS — J9621 Acute and chronic respiratory failure with hypoxia: Secondary | ICD-10-CM | POA: Diagnosis not present

## 2023-03-17 DIAGNOSIS — E8770 Fluid overload, unspecified: Secondary | ICD-10-CM | POA: Diagnosis not present

## 2023-03-17 DIAGNOSIS — J9601 Acute respiratory failure with hypoxia: Secondary | ICD-10-CM | POA: Diagnosis not present

## 2023-03-17 DIAGNOSIS — N186 End stage renal disease: Secondary | ICD-10-CM | POA: Diagnosis not present

## 2023-03-17 NOTE — Patient Outreach (Signed)
Care Coordination   Follow Up Visit Note   03/17/2023 Name: Christine Horn MRN: [id number] DOB: Jun 26, 1966  Christine Horn is a 57 y.o. year old female who sees Christine Horn, Christine Coffee, MD for primary care. I spoke with  Christine Horn by phone today.  What matters to the patients health and wellness today?  Pt reports she is back in hospital after being transferred from hospital to SNF rehab .    Goals Addressed   None     SDOH assessments and interventions completed:  Yes     Care Coordination Interventions:  Yes, provided   Follow up plan: Follow up call scheduled for 03/24/23    Encounter Outcome:  Patient Visit Completed

## 2023-03-18 DIAGNOSIS — J9601 Acute respiratory failure with hypoxia: Secondary | ICD-10-CM | POA: Diagnosis not present

## 2023-03-18 DIAGNOSIS — N186 End stage renal disease: Secondary | ICD-10-CM | POA: Diagnosis not present

## 2023-03-19 DIAGNOSIS — J9601 Acute respiratory failure with hypoxia: Secondary | ICD-10-CM | POA: Diagnosis not present

## 2023-03-20 DIAGNOSIS — J9601 Acute respiratory failure with hypoxia: Secondary | ICD-10-CM | POA: Diagnosis not present

## 2023-03-21 DIAGNOSIS — I959 Hypotension, unspecified: Secondary | ICD-10-CM | POA: Diagnosis not present

## 2023-03-21 DIAGNOSIS — J9601 Acute respiratory failure with hypoxia: Secondary | ICD-10-CM | POA: Diagnosis not present

## 2023-03-22 DIAGNOSIS — J9601 Acute respiratory failure with hypoxia: Secondary | ICD-10-CM | POA: Diagnosis not present

## 2023-03-22 DIAGNOSIS — I959 Hypotension, unspecified: Secondary | ICD-10-CM | POA: Diagnosis not present

## 2023-03-23 DIAGNOSIS — I959 Hypotension, unspecified: Secondary | ICD-10-CM | POA: Diagnosis not present

## 2023-03-23 DIAGNOSIS — J9601 Acute respiratory failure with hypoxia: Secondary | ICD-10-CM | POA: Diagnosis not present

## 2023-03-24 ENCOUNTER — Telehealth: Payer: Self-pay | Admitting: *Deleted

## 2023-03-24 ENCOUNTER — Encounter: Payer: Self-pay | Admitting: *Deleted

## 2023-03-24 DIAGNOSIS — J9601 Acute respiratory failure with hypoxia: Secondary | ICD-10-CM | POA: Diagnosis not present

## 2023-03-24 NOTE — Patient Outreach (Signed)
Care Coordination   03/24/2023 Name: Christine Horn MRN: [id number] DOB: 08-23-1965   Care Coordination Outreach Attempts:  An unsuccessful telephone outreach was attempted today to offer the patient information about available care coordination services.  Follow Up Plan:  Additional outreach attempts will be made to offer the patient care coordination information and services.   Encounter Outcome:  No Answer   Care Coordination Interventions:  No, not indicated    Reece Levy, MSW, LCSW Clinical Social Worker [phone]

## 2023-03-25 DIAGNOSIS — R2689 Other abnormalities of gait and mobility: Secondary | ICD-10-CM | POA: Diagnosis not present

## 2023-03-25 DIAGNOSIS — R339 Retention of urine, unspecified: Secondary | ICD-10-CM | POA: Diagnosis not present

## 2023-03-25 DIAGNOSIS — D62 Acute posthemorrhagic anemia: Secondary | ICD-10-CM | POA: Diagnosis present

## 2023-03-25 DIAGNOSIS — Z9049 Acquired absence of other specified parts of digestive tract: Secondary | ICD-10-CM | POA: Diagnosis not present

## 2023-03-25 DIAGNOSIS — M109 Gout, unspecified: Secondary | ICD-10-CM | POA: Diagnosis not present

## 2023-03-25 DIAGNOSIS — K219 Gastro-esophageal reflux disease without esophagitis: Secondary | ICD-10-CM | POA: Diagnosis present

## 2023-03-25 DIAGNOSIS — G629 Polyneuropathy, unspecified: Secondary | ICD-10-CM | POA: Diagnosis present

## 2023-03-25 DIAGNOSIS — Z981 Arthrodesis status: Secondary | ICD-10-CM | POA: Diagnosis not present

## 2023-03-25 DIAGNOSIS — R0902 Hypoxemia: Secondary | ICD-10-CM | POA: Diagnosis not present

## 2023-03-25 DIAGNOSIS — Z741 Need for assistance with personal care: Secondary | ICD-10-CM | POA: Diagnosis not present

## 2023-03-25 DIAGNOSIS — G4700 Insomnia, unspecified: Secondary | ICD-10-CM | POA: Diagnosis not present

## 2023-03-25 DIAGNOSIS — I10 Essential (primary) hypertension: Secondary | ICD-10-CM | POA: Diagnosis not present

## 2023-03-25 DIAGNOSIS — R54 Age-related physical debility: Secondary | ICD-10-CM | POA: Diagnosis present

## 2023-03-25 DIAGNOSIS — R278 Other lack of coordination: Secondary | ICD-10-CM | POA: Diagnosis not present

## 2023-03-25 DIAGNOSIS — F339 Major depressive disorder, recurrent, unspecified: Secondary | ICD-10-CM | POA: Diagnosis not present

## 2023-03-25 DIAGNOSIS — F32A Depression, unspecified: Secondary | ICD-10-CM | POA: Diagnosis not present

## 2023-03-25 DIAGNOSIS — M1A9XX0 Chronic gout, unspecified, without tophus (tophi): Secondary | ICD-10-CM | POA: Diagnosis present

## 2023-03-25 DIAGNOSIS — J9611 Chronic respiratory failure with hypoxia: Secondary | ICD-10-CM | POA: Diagnosis not present

## 2023-03-25 DIAGNOSIS — M4644 Discitis, unspecified, thoracic region: Secondary | ICD-10-CM | POA: Diagnosis not present

## 2023-03-25 DIAGNOSIS — R41841 Cognitive communication deficit: Secondary | ICD-10-CM | POA: Diagnosis not present

## 2023-03-25 DIAGNOSIS — Z96652 Presence of left artificial knee joint: Secondary | ICD-10-CM | POA: Diagnosis present

## 2023-03-25 DIAGNOSIS — M6281 Muscle weakness (generalized): Secondary | ICD-10-CM | POA: Diagnosis not present

## 2023-03-25 DIAGNOSIS — I132 Hypertensive heart and chronic kidney disease with heart failure and with stage 5 chronic kidney disease, or end stage renal disease: Secondary | ICD-10-CM | POA: Diagnosis present

## 2023-03-25 DIAGNOSIS — R1314 Dysphagia, pharyngoesophageal phase: Secondary | ICD-10-CM | POA: Diagnosis not present

## 2023-03-25 DIAGNOSIS — S22060D Wedge compression fracture of T7-T8 vertebra, subsequent encounter for fracture with routine healing: Secondary | ICD-10-CM | POA: Diagnosis not present

## 2023-03-25 DIAGNOSIS — J449 Chronic obstructive pulmonary disease, unspecified: Secondary | ICD-10-CM | POA: Diagnosis present

## 2023-03-25 DIAGNOSIS — Z79899 Other long term (current) drug therapy: Secondary | ICD-10-CM | POA: Diagnosis not present

## 2023-03-25 DIAGNOSIS — G8220 Paraplegia, unspecified: Secondary | ICD-10-CM | POA: Diagnosis present

## 2023-03-25 DIAGNOSIS — K529 Noninfective gastroenteritis and colitis, unspecified: Secondary | ICD-10-CM | POA: Diagnosis not present

## 2023-03-25 DIAGNOSIS — Z888 Allergy status to other drugs, medicaments and biological substances status: Secondary | ICD-10-CM | POA: Diagnosis not present

## 2023-03-25 DIAGNOSIS — L89322 Pressure ulcer of left buttock, stage 2: Secondary | ICD-10-CM | POA: Diagnosis not present

## 2023-03-25 DIAGNOSIS — I120 Hypertensive chronic kidney disease with stage 5 chronic kidney disease or end stage renal disease: Secondary | ICD-10-CM | POA: Diagnosis not present

## 2023-03-25 DIAGNOSIS — Z6836 Body mass index (BMI) 36.0-36.9, adult: Secondary | ICD-10-CM | POA: Diagnosis not present

## 2023-03-25 DIAGNOSIS — G2581 Restless legs syndrome: Secondary | ICD-10-CM | POA: Diagnosis not present

## 2023-03-25 DIAGNOSIS — Z885 Allergy status to narcotic agent status: Secondary | ICD-10-CM | POA: Diagnosis not present

## 2023-03-25 DIAGNOSIS — D509 Iron deficiency anemia, unspecified: Secondary | ICD-10-CM | POA: Diagnosis not present

## 2023-03-25 DIAGNOSIS — J189 Pneumonia, unspecified organism: Secondary | ICD-10-CM | POA: Diagnosis not present

## 2023-03-25 DIAGNOSIS — N186 End stage renal disease: Secondary | ICD-10-CM | POA: Diagnosis not present

## 2023-03-25 DIAGNOSIS — K625 Hemorrhage of anus and rectum: Secondary | ICD-10-CM | POA: Diagnosis not present

## 2023-03-25 DIAGNOSIS — N2581 Secondary hyperparathyroidism of renal origin: Secondary | ICD-10-CM | POA: Diagnosis not present

## 2023-03-25 DIAGNOSIS — Z9981 Dependence on supplemental oxygen: Secondary | ICD-10-CM | POA: Diagnosis not present

## 2023-03-25 DIAGNOSIS — K922 Gastrointestinal hemorrhage, unspecified: Secondary | ICD-10-CM | POA: Diagnosis not present

## 2023-03-25 DIAGNOSIS — D631 Anemia in chronic kidney disease: Secondary | ICD-10-CM | POA: Diagnosis not present

## 2023-03-25 DIAGNOSIS — K760 Fatty (change of) liver, not elsewhere classified: Secondary | ICD-10-CM | POA: Diagnosis not present

## 2023-03-25 DIAGNOSIS — Z992 Dependence on renal dialysis: Secondary | ICD-10-CM | POA: Diagnosis not present

## 2023-03-25 DIAGNOSIS — I5032 Chronic diastolic (congestive) heart failure: Secondary | ICD-10-CM | POA: Diagnosis present

## 2023-03-25 DIAGNOSIS — R112 Nausea with vomiting, unspecified: Secondary | ICD-10-CM | POA: Diagnosis not present

## 2023-03-25 DIAGNOSIS — J9601 Acute respiratory failure with hypoxia: Secondary | ICD-10-CM | POA: Diagnosis not present

## 2023-03-25 DIAGNOSIS — A0472 Enterocolitis due to Clostridium difficile, not specified as recurrent: Secondary | ICD-10-CM | POA: Diagnosis not present

## 2023-03-25 DIAGNOSIS — M545 Low back pain, unspecified: Secondary | ICD-10-CM | POA: Diagnosis not present

## 2023-03-25 DIAGNOSIS — M1009 Idiopathic gout, multiple sites: Secondary | ICD-10-CM | POA: Diagnosis not present

## 2023-03-25 DIAGNOSIS — M62838 Other muscle spasm: Secondary | ICD-10-CM | POA: Diagnosis not present

## 2023-03-25 DIAGNOSIS — A0471 Enterocolitis due to Clostridium difficile, recurrent: Secondary | ICD-10-CM | POA: Diagnosis present

## 2023-03-25 DIAGNOSIS — K626 Ulcer of anus and rectum: Secondary | ICD-10-CM | POA: Diagnosis present

## 2023-03-25 DIAGNOSIS — R1312 Dysphagia, oropharyngeal phase: Secondary | ICD-10-CM | POA: Diagnosis not present

## 2023-03-26 DIAGNOSIS — N186 End stage renal disease: Secondary | ICD-10-CM | POA: Diagnosis not present

## 2023-03-26 DIAGNOSIS — J9611 Chronic respiratory failure with hypoxia: Secondary | ICD-10-CM | POA: Diagnosis not present

## 2023-03-26 DIAGNOSIS — M1009 Idiopathic gout, multiple sites: Secondary | ICD-10-CM | POA: Diagnosis not present

## 2023-03-26 DIAGNOSIS — I10 Essential (primary) hypertension: Secondary | ICD-10-CM | POA: Diagnosis not present

## 2023-03-27 DIAGNOSIS — Z992 Dependence on renal dialysis: Secondary | ICD-10-CM | POA: Diagnosis not present

## 2023-03-27 DIAGNOSIS — N186 End stage renal disease: Secondary | ICD-10-CM | POA: Diagnosis not present

## 2023-03-27 DIAGNOSIS — N2581 Secondary hyperparathyroidism of renal origin: Secondary | ICD-10-CM | POA: Diagnosis not present

## 2023-03-27 DIAGNOSIS — D631 Anemia in chronic kidney disease: Secondary | ICD-10-CM | POA: Diagnosis not present

## 2023-03-27 DIAGNOSIS — D509 Iron deficiency anemia, unspecified: Secondary | ICD-10-CM | POA: Diagnosis not present

## 2023-03-30 DIAGNOSIS — N2581 Secondary hyperparathyroidism of renal origin: Secondary | ICD-10-CM | POA: Diagnosis not present

## 2023-03-30 DIAGNOSIS — Z992 Dependence on renal dialysis: Secondary | ICD-10-CM | POA: Diagnosis not present

## 2023-03-30 DIAGNOSIS — D509 Iron deficiency anemia, unspecified: Secondary | ICD-10-CM | POA: Diagnosis not present

## 2023-03-30 DIAGNOSIS — D631 Anemia in chronic kidney disease: Secondary | ICD-10-CM | POA: Diagnosis not present

## 2023-03-30 DIAGNOSIS — N186 End stage renal disease: Secondary | ICD-10-CM | POA: Diagnosis not present

## 2023-03-31 DIAGNOSIS — J9611 Chronic respiratory failure with hypoxia: Secondary | ICD-10-CM | POA: Diagnosis not present

## 2023-03-31 DIAGNOSIS — N186 End stage renal disease: Secondary | ICD-10-CM | POA: Diagnosis not present

## 2023-03-31 DIAGNOSIS — M545 Low back pain, unspecified: Secondary | ICD-10-CM | POA: Diagnosis not present

## 2023-03-31 DIAGNOSIS — I10 Essential (primary) hypertension: Secondary | ICD-10-CM | POA: Diagnosis not present

## 2023-04-01 ENCOUNTER — Telehealth: Payer: Self-pay | Admitting: *Deleted

## 2023-04-01 ENCOUNTER — Encounter: Payer: Self-pay | Admitting: *Deleted

## 2023-04-01 DIAGNOSIS — D631 Anemia in chronic kidney disease: Secondary | ICD-10-CM | POA: Diagnosis not present

## 2023-04-01 DIAGNOSIS — N186 End stage renal disease: Secondary | ICD-10-CM | POA: Diagnosis not present

## 2023-04-01 DIAGNOSIS — N2581 Secondary hyperparathyroidism of renal origin: Secondary | ICD-10-CM | POA: Diagnosis not present

## 2023-04-01 DIAGNOSIS — Z992 Dependence on renal dialysis: Secondary | ICD-10-CM | POA: Diagnosis not present

## 2023-04-01 DIAGNOSIS — D509 Iron deficiency anemia, unspecified: Secondary | ICD-10-CM | POA: Diagnosis not present

## 2023-04-01 NOTE — Patient Outreach (Signed)
Care Coordination   04/01/2023 Name: Kayna Xie MRN: [id number] DOB: 28-Mar-1966   Care Coordination Outreach Attempts:  An unsuccessful telephone outreach was attempted today to offer the patient information about available care coordination services.  Follow Up Plan:  Additional outreach attempts will be made to offer the patient care coordination information and services.   Encounter Outcome:  No Answer   Care Coordination Interventions:  No, not indicated    Reece Levy, MSW, LCSW Clinical Social Worker [phone]

## 2023-04-02 DIAGNOSIS — J9611 Chronic respiratory failure with hypoxia: Secondary | ICD-10-CM | POA: Diagnosis not present

## 2023-04-02 DIAGNOSIS — G4700 Insomnia, unspecified: Secondary | ICD-10-CM | POA: Diagnosis not present

## 2023-04-02 DIAGNOSIS — N186 End stage renal disease: Secondary | ICD-10-CM | POA: Diagnosis not present

## 2023-04-02 DIAGNOSIS — F339 Major depressive disorder, recurrent, unspecified: Secondary | ICD-10-CM | POA: Diagnosis not present

## 2023-04-02 DIAGNOSIS — I10 Essential (primary) hypertension: Secondary | ICD-10-CM | POA: Diagnosis not present

## 2023-04-02 DIAGNOSIS — M1009 Idiopathic gout, multiple sites: Secondary | ICD-10-CM | POA: Diagnosis not present

## 2023-04-03 DIAGNOSIS — D631 Anemia in chronic kidney disease: Secondary | ICD-10-CM | POA: Diagnosis not present

## 2023-04-03 DIAGNOSIS — N186 End stage renal disease: Secondary | ICD-10-CM | POA: Diagnosis not present

## 2023-04-03 DIAGNOSIS — N2581 Secondary hyperparathyroidism of renal origin: Secondary | ICD-10-CM | POA: Diagnosis not present

## 2023-04-03 DIAGNOSIS — D509 Iron deficiency anemia, unspecified: Secondary | ICD-10-CM | POA: Diagnosis not present

## 2023-04-03 DIAGNOSIS — Z992 Dependence on renal dialysis: Secondary | ICD-10-CM | POA: Diagnosis not present

## 2023-04-06 ENCOUNTER — Ambulatory Visit: Payer: Self-pay | Admitting: *Deleted

## 2023-04-06 DIAGNOSIS — D631 Anemia in chronic kidney disease: Secondary | ICD-10-CM | POA: Diagnosis not present

## 2023-04-06 DIAGNOSIS — N186 End stage renal disease: Secondary | ICD-10-CM | POA: Diagnosis not present

## 2023-04-06 DIAGNOSIS — D509 Iron deficiency anemia, unspecified: Secondary | ICD-10-CM | POA: Diagnosis not present

## 2023-04-06 DIAGNOSIS — Z992 Dependence on renal dialysis: Secondary | ICD-10-CM | POA: Diagnosis not present

## 2023-04-06 DIAGNOSIS — N2581 Secondary hyperparathyroidism of renal origin: Secondary | ICD-10-CM | POA: Diagnosis not present

## 2023-04-06 NOTE — Patient Outreach (Signed)
Care Coordination   Follow Up Visit Note   04/06/2023 Name: Christine Horn MRN: [id number] DOB: 14-Aug-1965  Christine Horn is a 56 y.o. year old female who sees [name], [name], MD for primary care. I  spoke with pt's husband, Christine Horn, by phone today  What matters to the patients health and wellness today?  She has returned to SNF rehab after hospital stay.    Goals Addressed             This Visit's Progress    COMPLETED: Need help with resources and caregiver support and depression       Activities and task to complete in order to accomplish goals.   Medicaid application is approved  Rehab at  [place], [place]- [place] SNF rehab and HD arranged as well         SDOH assessments and interventions completed:  Yes     Care Coordination Interventions:  Yes, provided  Interventions Today    Flowsheet Row Most Recent Value  General Interventions   General Interventions Discussed/Reviewed Level of Care  Level of Care Skilled Nursing Facility, Applications  [Per husband, pt is back at [facility] rehab for SNF care.]  Applications Medicaid  [Per husband, Medicaid was approved.]  Education Interventions   Applications Medicaid  [Per husband, Medicaid was approved.]       Follow up plan: No further intervention required.   Encounter Outcome:  Patient Visit Completed

## 2023-04-08 DIAGNOSIS — N2581 Secondary hyperparathyroidism of renal origin: Secondary | ICD-10-CM | POA: Diagnosis not present

## 2023-04-08 DIAGNOSIS — N186 End stage renal disease: Secondary | ICD-10-CM | POA: Diagnosis not present

## 2023-04-08 DIAGNOSIS — D631 Anemia in chronic kidney disease: Secondary | ICD-10-CM | POA: Diagnosis not present

## 2023-04-08 DIAGNOSIS — Z992 Dependence on renal dialysis: Secondary | ICD-10-CM | POA: Diagnosis not present

## 2023-04-08 DIAGNOSIS — D509 Iron deficiency anemia, unspecified: Secondary | ICD-10-CM | POA: Diagnosis not present

## 2023-04-09 DIAGNOSIS — M1009 Idiopathic gout, multiple sites: Secondary | ICD-10-CM | POA: Diagnosis not present

## 2023-04-09 DIAGNOSIS — J9611 Chronic respiratory failure with hypoxia: Secondary | ICD-10-CM | POA: Diagnosis not present

## 2023-04-09 DIAGNOSIS — I10 Essential (primary) hypertension: Secondary | ICD-10-CM | POA: Diagnosis not present

## 2023-04-09 DIAGNOSIS — N186 End stage renal disease: Secondary | ICD-10-CM | POA: Diagnosis not present

## 2023-04-10 DIAGNOSIS — D631 Anemia in chronic kidney disease: Secondary | ICD-10-CM | POA: Diagnosis not present

## 2023-04-10 DIAGNOSIS — Z992 Dependence on renal dialysis: Secondary | ICD-10-CM | POA: Diagnosis not present

## 2023-04-10 DIAGNOSIS — N2581 Secondary hyperparathyroidism of renal origin: Secondary | ICD-10-CM | POA: Diagnosis not present

## 2023-04-10 DIAGNOSIS — N186 End stage renal disease: Secondary | ICD-10-CM | POA: Diagnosis not present

## 2023-04-10 DIAGNOSIS — D509 Iron deficiency anemia, unspecified: Secondary | ICD-10-CM | POA: Diagnosis not present

## 2023-04-11 DIAGNOSIS — R0902 Hypoxemia: Secondary | ICD-10-CM | POA: Diagnosis not present

## 2023-04-13 DIAGNOSIS — N186 End stage renal disease: Secondary | ICD-10-CM | POA: Diagnosis not present

## 2023-04-13 DIAGNOSIS — Z992 Dependence on renal dialysis: Secondary | ICD-10-CM | POA: Diagnosis not present

## 2023-04-13 DIAGNOSIS — D509 Iron deficiency anemia, unspecified: Secondary | ICD-10-CM | POA: Diagnosis not present

## 2023-04-13 DIAGNOSIS — N2581 Secondary hyperparathyroidism of renal origin: Secondary | ICD-10-CM | POA: Diagnosis not present

## 2023-04-13 DIAGNOSIS — D631 Anemia in chronic kidney disease: Secondary | ICD-10-CM | POA: Diagnosis not present

## 2023-04-15 DIAGNOSIS — D631 Anemia in chronic kidney disease: Secondary | ICD-10-CM | POA: Diagnosis not present

## 2023-04-15 DIAGNOSIS — Z992 Dependence on renal dialysis: Secondary | ICD-10-CM | POA: Diagnosis not present

## 2023-04-15 DIAGNOSIS — N2581 Secondary hyperparathyroidism of renal origin: Secondary | ICD-10-CM | POA: Diagnosis not present

## 2023-04-15 DIAGNOSIS — N186 End stage renal disease: Secondary | ICD-10-CM | POA: Diagnosis not present

## 2023-04-15 DIAGNOSIS — D509 Iron deficiency anemia, unspecified: Secondary | ICD-10-CM | POA: Diagnosis not present

## 2023-04-16 DIAGNOSIS — I10 Essential (primary) hypertension: Secondary | ICD-10-CM | POA: Diagnosis not present

## 2023-04-16 DIAGNOSIS — M1009 Idiopathic gout, multiple sites: Secondary | ICD-10-CM | POA: Diagnosis not present

## 2023-04-16 DIAGNOSIS — N186 End stage renal disease: Secondary | ICD-10-CM | POA: Diagnosis not present

## 2023-04-16 DIAGNOSIS — J9611 Chronic respiratory failure with hypoxia: Secondary | ICD-10-CM | POA: Diagnosis not present

## 2023-04-17 DIAGNOSIS — D509 Iron deficiency anemia, unspecified: Secondary | ICD-10-CM | POA: Diagnosis not present

## 2023-04-17 DIAGNOSIS — N2581 Secondary hyperparathyroidism of renal origin: Secondary | ICD-10-CM | POA: Diagnosis not present

## 2023-04-17 DIAGNOSIS — D631 Anemia in chronic kidney disease: Secondary | ICD-10-CM | POA: Diagnosis not present

## 2023-04-17 DIAGNOSIS — N186 End stage renal disease: Secondary | ICD-10-CM | POA: Diagnosis not present

## 2023-04-17 DIAGNOSIS — Z992 Dependence on renal dialysis: Secondary | ICD-10-CM | POA: Diagnosis not present

## 2023-04-18 DIAGNOSIS — Z9049 Acquired absence of other specified parts of digestive tract: Secondary | ICD-10-CM | POA: Diagnosis not present

## 2023-04-18 DIAGNOSIS — Z6836 Body mass index (BMI) 36.0-36.9, adult: Secondary | ICD-10-CM | POA: Diagnosis not present

## 2023-04-18 DIAGNOSIS — A0472 Enterocolitis due to Clostridium difficile, not specified as recurrent: Secondary | ICD-10-CM | POA: Diagnosis not present

## 2023-04-18 DIAGNOSIS — K626 Ulcer of anus and rectum: Secondary | ICD-10-CM | POA: Diagnosis present

## 2023-04-18 DIAGNOSIS — K219 Gastro-esophageal reflux disease without esophagitis: Secondary | ICD-10-CM | POA: Diagnosis present

## 2023-04-18 DIAGNOSIS — M1A9XX0 Chronic gout, unspecified, without tophus (tophi): Secondary | ICD-10-CM | POA: Diagnosis present

## 2023-04-18 DIAGNOSIS — G629 Polyneuropathy, unspecified: Secondary | ICD-10-CM | POA: Diagnosis present

## 2023-04-18 DIAGNOSIS — J449 Chronic obstructive pulmonary disease, unspecified: Secondary | ICD-10-CM | POA: Diagnosis present

## 2023-04-18 DIAGNOSIS — R41841 Cognitive communication deficit: Secondary | ICD-10-CM | POA: Diagnosis not present

## 2023-04-18 DIAGNOSIS — S22060D Wedge compression fracture of T7-T8 vertebra, subsequent encounter for fracture with routine healing: Secondary | ICD-10-CM | POA: Diagnosis not present

## 2023-04-18 DIAGNOSIS — M109 Gout, unspecified: Secondary | ICD-10-CM | POA: Diagnosis not present

## 2023-04-18 DIAGNOSIS — Z981 Arthrodesis status: Secondary | ICD-10-CM | POA: Diagnosis not present

## 2023-04-18 DIAGNOSIS — K760 Fatty (change of) liver, not elsewhere classified: Secondary | ICD-10-CM | POA: Diagnosis not present

## 2023-04-18 DIAGNOSIS — R278 Other lack of coordination: Secondary | ICD-10-CM | POA: Diagnosis not present

## 2023-04-18 DIAGNOSIS — D62 Acute posthemorrhagic anemia: Secondary | ICD-10-CM | POA: Diagnosis present

## 2023-04-18 DIAGNOSIS — R1312 Dysphagia, oropharyngeal phase: Secondary | ICD-10-CM | POA: Diagnosis not present

## 2023-04-18 DIAGNOSIS — I132 Hypertensive heart and chronic kidney disease with heart failure and with stage 5 chronic kidney disease, or end stage renal disease: Secondary | ICD-10-CM | POA: Diagnosis present

## 2023-04-18 DIAGNOSIS — Z96652 Presence of left artificial knee joint: Secondary | ICD-10-CM | POA: Diagnosis present

## 2023-04-18 DIAGNOSIS — J189 Pneumonia, unspecified organism: Secondary | ICD-10-CM | POA: Diagnosis not present

## 2023-04-18 DIAGNOSIS — I120 Hypertensive chronic kidney disease with stage 5 chronic kidney disease or end stage renal disease: Secondary | ICD-10-CM | POA: Diagnosis not present

## 2023-04-18 DIAGNOSIS — R197 Diarrhea, unspecified: Secondary | ICD-10-CM | POA: Diagnosis not present

## 2023-04-18 DIAGNOSIS — J9611 Chronic respiratory failure with hypoxia: Secondary | ICD-10-CM | POA: Diagnosis present

## 2023-04-18 DIAGNOSIS — R2689 Other abnormalities of gait and mobility: Secondary | ICD-10-CM | POA: Diagnosis not present

## 2023-04-18 DIAGNOSIS — R54 Age-related physical debility: Secondary | ICD-10-CM | POA: Diagnosis present

## 2023-04-18 DIAGNOSIS — M62838 Other muscle spasm: Secondary | ICD-10-CM | POA: Diagnosis not present

## 2023-04-18 DIAGNOSIS — L89322 Pressure ulcer of left buttock, stage 2: Secondary | ICD-10-CM | POA: Diagnosis not present

## 2023-04-18 DIAGNOSIS — G8220 Paraplegia, unspecified: Secondary | ICD-10-CM | POA: Diagnosis not present

## 2023-04-18 DIAGNOSIS — M6281 Muscle weakness (generalized): Secondary | ICD-10-CM | POA: Diagnosis not present

## 2023-04-18 DIAGNOSIS — N186 End stage renal disease: Secondary | ICD-10-CM | POA: Diagnosis present

## 2023-04-18 DIAGNOSIS — Z79899 Other long term (current) drug therapy: Secondary | ICD-10-CM | POA: Diagnosis not present

## 2023-04-18 DIAGNOSIS — R1314 Dysphagia, pharyngoesophageal phase: Secondary | ICD-10-CM | POA: Diagnosis not present

## 2023-04-18 DIAGNOSIS — K625 Hemorrhage of anus and rectum: Secondary | ICD-10-CM | POA: Diagnosis not present

## 2023-04-18 DIAGNOSIS — Z9981 Dependence on supplemental oxygen: Secondary | ICD-10-CM | POA: Diagnosis not present

## 2023-04-18 DIAGNOSIS — Z992 Dependence on renal dialysis: Secondary | ICD-10-CM | POA: Diagnosis not present

## 2023-04-18 DIAGNOSIS — Z885 Allergy status to narcotic agent status: Secondary | ICD-10-CM | POA: Diagnosis not present

## 2023-04-18 DIAGNOSIS — K529 Noninfective gastroenteritis and colitis, unspecified: Secondary | ICD-10-CM | POA: Diagnosis not present

## 2023-04-18 DIAGNOSIS — I5032 Chronic diastolic (congestive) heart failure: Secondary | ICD-10-CM | POA: Diagnosis present

## 2023-04-18 DIAGNOSIS — K922 Gastrointestinal hemorrhage, unspecified: Secondary | ICD-10-CM | POA: Diagnosis not present

## 2023-04-18 DIAGNOSIS — M4644 Discitis, unspecified, thoracic region: Secondary | ICD-10-CM | POA: Diagnosis not present

## 2023-04-18 DIAGNOSIS — A0471 Enterocolitis due to Clostridium difficile, recurrent: Secondary | ICD-10-CM | POA: Diagnosis present

## 2023-04-18 DIAGNOSIS — Z741 Need for assistance with personal care: Secondary | ICD-10-CM | POA: Diagnosis not present

## 2023-04-18 DIAGNOSIS — Z888 Allergy status to other drugs, medicaments and biological substances status: Secondary | ICD-10-CM | POA: Diagnosis not present

## 2023-04-18 DIAGNOSIS — G2581 Restless legs syndrome: Secondary | ICD-10-CM | POA: Diagnosis present

## 2023-04-18 DIAGNOSIS — F339 Major depressive disorder, recurrent, unspecified: Secondary | ICD-10-CM | POA: Diagnosis not present

## 2023-04-19 DIAGNOSIS — R54 Age-related physical debility: Secondary | ICD-10-CM | POA: Diagnosis present

## 2023-04-19 DIAGNOSIS — J449 Chronic obstructive pulmonary disease, unspecified: Secondary | ICD-10-CM | POA: Diagnosis present

## 2023-04-19 DIAGNOSIS — R278 Other lack of coordination: Secondary | ICD-10-CM | POA: Diagnosis not present

## 2023-04-19 DIAGNOSIS — M62838 Other muscle spasm: Secondary | ICD-10-CM | POA: Diagnosis not present

## 2023-04-19 DIAGNOSIS — K626 Ulcer of anus and rectum: Secondary | ICD-10-CM | POA: Diagnosis present

## 2023-04-19 DIAGNOSIS — J189 Pneumonia, unspecified organism: Secondary | ICD-10-CM | POA: Diagnosis not present

## 2023-04-19 DIAGNOSIS — R1312 Dysphagia, oropharyngeal phase: Secondary | ICD-10-CM | POA: Diagnosis not present

## 2023-04-19 DIAGNOSIS — J9611 Chronic respiratory failure with hypoxia: Secondary | ICD-10-CM | POA: Diagnosis present

## 2023-04-19 DIAGNOSIS — Z96652 Presence of left artificial knee joint: Secondary | ICD-10-CM | POA: Diagnosis present

## 2023-04-19 DIAGNOSIS — G8220 Paraplegia, unspecified: Secondary | ICD-10-CM | POA: Diagnosis present

## 2023-04-19 DIAGNOSIS — M4644 Discitis, unspecified, thoracic region: Secondary | ICD-10-CM | POA: Diagnosis not present

## 2023-04-19 DIAGNOSIS — Z9981 Dependence on supplemental oxygen: Secondary | ICD-10-CM | POA: Diagnosis not present

## 2023-04-19 DIAGNOSIS — Z79899 Other long term (current) drug therapy: Secondary | ICD-10-CM | POA: Diagnosis not present

## 2023-04-19 DIAGNOSIS — K529 Noninfective gastroenteritis and colitis, unspecified: Secondary | ICD-10-CM | POA: Diagnosis not present

## 2023-04-19 DIAGNOSIS — R2689 Other abnormalities of gait and mobility: Secondary | ICD-10-CM | POA: Diagnosis not present

## 2023-04-19 DIAGNOSIS — Z741 Need for assistance with personal care: Secondary | ICD-10-CM | POA: Diagnosis not present

## 2023-04-19 DIAGNOSIS — Z885 Allergy status to narcotic agent status: Secondary | ICD-10-CM | POA: Diagnosis not present

## 2023-04-19 DIAGNOSIS — R197 Diarrhea, unspecified: Secondary | ICD-10-CM | POA: Diagnosis not present

## 2023-04-19 DIAGNOSIS — M109 Gout, unspecified: Secondary | ICD-10-CM | POA: Diagnosis not present

## 2023-04-19 DIAGNOSIS — F339 Major depressive disorder, recurrent, unspecified: Secondary | ICD-10-CM | POA: Diagnosis not present

## 2023-04-19 DIAGNOSIS — I132 Hypertensive heart and chronic kidney disease with heart failure and with stage 5 chronic kidney disease, or end stage renal disease: Secondary | ICD-10-CM | POA: Diagnosis present

## 2023-04-19 DIAGNOSIS — Z888 Allergy status to other drugs, medicaments and biological substances status: Secondary | ICD-10-CM | POA: Diagnosis not present

## 2023-04-19 DIAGNOSIS — G629 Polyneuropathy, unspecified: Secondary | ICD-10-CM | POA: Diagnosis present

## 2023-04-19 DIAGNOSIS — N186 End stage renal disease: Secondary | ICD-10-CM | POA: Diagnosis present

## 2023-04-19 DIAGNOSIS — Z6836 Body mass index (BMI) 36.0-36.9, adult: Secondary | ICD-10-CM | POA: Diagnosis not present

## 2023-04-19 DIAGNOSIS — M6281 Muscle weakness (generalized): Secondary | ICD-10-CM | POA: Diagnosis not present

## 2023-04-19 DIAGNOSIS — K219 Gastro-esophageal reflux disease without esophagitis: Secondary | ICD-10-CM | POA: Diagnosis present

## 2023-04-19 DIAGNOSIS — D62 Acute posthemorrhagic anemia: Secondary | ICD-10-CM | POA: Diagnosis present

## 2023-04-19 DIAGNOSIS — G2581 Restless legs syndrome: Secondary | ICD-10-CM | POA: Diagnosis present

## 2023-04-19 DIAGNOSIS — K625 Hemorrhage of anus and rectum: Secondary | ICD-10-CM | POA: Diagnosis not present

## 2023-04-19 DIAGNOSIS — I5032 Chronic diastolic (congestive) heart failure: Secondary | ICD-10-CM | POA: Diagnosis present

## 2023-04-19 DIAGNOSIS — A0471 Enterocolitis due to Clostridium difficile, recurrent: Secondary | ICD-10-CM | POA: Diagnosis present

## 2023-04-19 DIAGNOSIS — S22060D Wedge compression fracture of T7-T8 vertebra, subsequent encounter for fracture with routine healing: Secondary | ICD-10-CM | POA: Diagnosis not present

## 2023-04-19 DIAGNOSIS — Z9049 Acquired absence of other specified parts of digestive tract: Secondary | ICD-10-CM | POA: Diagnosis not present

## 2023-04-19 DIAGNOSIS — K760 Fatty (change of) liver, not elsewhere classified: Secondary | ICD-10-CM | POA: Diagnosis not present

## 2023-04-19 DIAGNOSIS — Z992 Dependence on renal dialysis: Secondary | ICD-10-CM | POA: Diagnosis not present

## 2023-04-19 DIAGNOSIS — L89322 Pressure ulcer of left buttock, stage 2: Secondary | ICD-10-CM | POA: Diagnosis not present

## 2023-04-19 DIAGNOSIS — A0472 Enterocolitis due to Clostridium difficile, not specified as recurrent: Secondary | ICD-10-CM | POA: Diagnosis not present

## 2023-04-19 DIAGNOSIS — Z981 Arthrodesis status: Secondary | ICD-10-CM | POA: Diagnosis not present

## 2023-04-19 DIAGNOSIS — R41841 Cognitive communication deficit: Secondary | ICD-10-CM | POA: Diagnosis not present

## 2023-04-19 DIAGNOSIS — I120 Hypertensive chronic kidney disease with stage 5 chronic kidney disease or end stage renal disease: Secondary | ICD-10-CM | POA: Diagnosis not present

## 2023-04-19 DIAGNOSIS — R1314 Dysphagia, pharyngoesophageal phase: Secondary | ICD-10-CM | POA: Diagnosis not present

## 2023-04-19 DIAGNOSIS — M1A9XX0 Chronic gout, unspecified, without tophus (tophi): Secondary | ICD-10-CM | POA: Diagnosis present

## 2023-04-23 DIAGNOSIS — M4644 Discitis, unspecified, thoracic region: Secondary | ICD-10-CM | POA: Diagnosis not present

## 2023-04-23 DIAGNOSIS — K625 Hemorrhage of anus and rectum: Secondary | ICD-10-CM | POA: Diagnosis not present

## 2023-04-23 DIAGNOSIS — D631 Anemia in chronic kidney disease: Secondary | ICD-10-CM | POA: Diagnosis not present

## 2023-04-23 DIAGNOSIS — M109 Gout, unspecified: Secondary | ICD-10-CM | POA: Diagnosis not present

## 2023-04-23 DIAGNOSIS — N186 End stage renal disease: Secondary | ICD-10-CM | POA: Diagnosis not present

## 2023-04-23 DIAGNOSIS — R1312 Dysphagia, oropharyngeal phase: Secondary | ICD-10-CM | POA: Diagnosis not present

## 2023-04-23 DIAGNOSIS — L89322 Pressure ulcer of left buttock, stage 2: Secondary | ICD-10-CM | POA: Diagnosis not present

## 2023-04-23 DIAGNOSIS — Z992 Dependence on renal dialysis: Secondary | ICD-10-CM | POA: Diagnosis not present

## 2023-04-23 DIAGNOSIS — R278 Other lack of coordination: Secondary | ICD-10-CM | POA: Diagnosis not present

## 2023-04-23 DIAGNOSIS — M62838 Other muscle spasm: Secondary | ICD-10-CM | POA: Diagnosis not present

## 2023-04-23 DIAGNOSIS — A0472 Enterocolitis due to Clostridium difficile, not specified as recurrent: Secondary | ICD-10-CM | POA: Diagnosis not present

## 2023-04-23 DIAGNOSIS — R2689 Other abnormalities of gait and mobility: Secondary | ICD-10-CM | POA: Diagnosis not present

## 2023-04-23 DIAGNOSIS — Z741 Need for assistance with personal care: Secondary | ICD-10-CM | POA: Diagnosis not present

## 2023-04-23 DIAGNOSIS — N2581 Secondary hyperparathyroidism of renal origin: Secondary | ICD-10-CM | POA: Diagnosis not present

## 2023-04-23 DIAGNOSIS — J9611 Chronic respiratory failure with hypoxia: Secondary | ICD-10-CM | POA: Diagnosis not present

## 2023-04-23 DIAGNOSIS — G2581 Restless legs syndrome: Secondary | ICD-10-CM | POA: Diagnosis not present

## 2023-04-23 DIAGNOSIS — R41841 Cognitive communication deficit: Secondary | ICD-10-CM | POA: Diagnosis not present

## 2023-04-23 DIAGNOSIS — M1009 Idiopathic gout, multiple sites: Secondary | ICD-10-CM | POA: Diagnosis not present

## 2023-04-23 DIAGNOSIS — D509 Iron deficiency anemia, unspecified: Secondary | ICD-10-CM | POA: Diagnosis not present

## 2023-04-23 DIAGNOSIS — G4700 Insomnia, unspecified: Secondary | ICD-10-CM | POA: Diagnosis not present

## 2023-04-23 DIAGNOSIS — F339 Major depressive disorder, recurrent, unspecified: Secondary | ICD-10-CM | POA: Diagnosis not present

## 2023-04-23 DIAGNOSIS — M6281 Muscle weakness (generalized): Secondary | ICD-10-CM | POA: Diagnosis not present

## 2023-04-23 DIAGNOSIS — S22060D Wedge compression fracture of T7-T8 vertebra, subsequent encounter for fracture with routine healing: Secondary | ICD-10-CM | POA: Diagnosis not present

## 2023-04-23 DIAGNOSIS — R1314 Dysphagia, pharyngoesophageal phase: Secondary | ICD-10-CM | POA: Diagnosis not present

## 2023-04-23 DIAGNOSIS — I10 Essential (primary) hypertension: Secondary | ICD-10-CM | POA: Diagnosis not present

## 2023-04-24 DIAGNOSIS — Z992 Dependence on renal dialysis: Secondary | ICD-10-CM | POA: Diagnosis not present

## 2023-04-24 DIAGNOSIS — D509 Iron deficiency anemia, unspecified: Secondary | ICD-10-CM | POA: Diagnosis not present

## 2023-04-24 DIAGNOSIS — D631 Anemia in chronic kidney disease: Secondary | ICD-10-CM | POA: Diagnosis not present

## 2023-04-24 DIAGNOSIS — N2581 Secondary hyperparathyroidism of renal origin: Secondary | ICD-10-CM | POA: Diagnosis not present

## 2023-04-24 DIAGNOSIS — N186 End stage renal disease: Secondary | ICD-10-CM | POA: Diagnosis not present

## 2023-04-27 DIAGNOSIS — N2581 Secondary hyperparathyroidism of renal origin: Secondary | ICD-10-CM | POA: Diagnosis not present

## 2023-04-27 DIAGNOSIS — D631 Anemia in chronic kidney disease: Secondary | ICD-10-CM | POA: Diagnosis not present

## 2023-04-27 DIAGNOSIS — Z992 Dependence on renal dialysis: Secondary | ICD-10-CM | POA: Diagnosis not present

## 2023-04-27 DIAGNOSIS — D509 Iron deficiency anemia, unspecified: Secondary | ICD-10-CM | POA: Diagnosis not present

## 2023-04-27 DIAGNOSIS — N186 End stage renal disease: Secondary | ICD-10-CM | POA: Diagnosis not present

## 2023-04-28 DIAGNOSIS — J9611 Chronic respiratory failure with hypoxia: Secondary | ICD-10-CM | POA: Diagnosis not present

## 2023-04-28 DIAGNOSIS — M1009 Idiopathic gout, multiple sites: Secondary | ICD-10-CM | POA: Diagnosis not present

## 2023-04-28 DIAGNOSIS — N186 End stage renal disease: Secondary | ICD-10-CM | POA: Diagnosis not present

## 2023-04-28 DIAGNOSIS — I10 Essential (primary) hypertension: Secondary | ICD-10-CM | POA: Diagnosis not present

## 2023-04-29 DIAGNOSIS — D509 Iron deficiency anemia, unspecified: Secondary | ICD-10-CM | POA: Diagnosis not present

## 2023-04-29 DIAGNOSIS — Z992 Dependence on renal dialysis: Secondary | ICD-10-CM | POA: Diagnosis not present

## 2023-04-29 DIAGNOSIS — D631 Anemia in chronic kidney disease: Secondary | ICD-10-CM | POA: Diagnosis not present

## 2023-04-29 DIAGNOSIS — N2581 Secondary hyperparathyroidism of renal origin: Secondary | ICD-10-CM | POA: Diagnosis not present

## 2023-04-29 DIAGNOSIS — N186 End stage renal disease: Secondary | ICD-10-CM | POA: Diagnosis not present

## 2023-04-30 DIAGNOSIS — N186 End stage renal disease: Secondary | ICD-10-CM | POA: Diagnosis not present

## 2023-04-30 DIAGNOSIS — F339 Major depressive disorder, recurrent, unspecified: Secondary | ICD-10-CM | POA: Diagnosis not present

## 2023-04-30 DIAGNOSIS — G4700 Insomnia, unspecified: Secondary | ICD-10-CM | POA: Diagnosis not present

## 2023-05-01 DIAGNOSIS — N186 End stage renal disease: Secondary | ICD-10-CM | POA: Diagnosis not present

## 2023-05-01 DIAGNOSIS — Z992 Dependence on renal dialysis: Secondary | ICD-10-CM | POA: Diagnosis not present

## 2023-05-01 DIAGNOSIS — D631 Anemia in chronic kidney disease: Secondary | ICD-10-CM | POA: Diagnosis not present

## 2023-05-01 DIAGNOSIS — N2581 Secondary hyperparathyroidism of renal origin: Secondary | ICD-10-CM | POA: Diagnosis not present

## 2023-05-04 DIAGNOSIS — Z992 Dependence on renal dialysis: Secondary | ICD-10-CM | POA: Diagnosis not present

## 2023-05-04 DIAGNOSIS — N2581 Secondary hyperparathyroidism of renal origin: Secondary | ICD-10-CM | POA: Diagnosis not present

## 2023-05-04 DIAGNOSIS — D631 Anemia in chronic kidney disease: Secondary | ICD-10-CM | POA: Diagnosis not present

## 2023-05-04 DIAGNOSIS — N186 End stage renal disease: Secondary | ICD-10-CM | POA: Diagnosis not present

## 2023-05-05 DIAGNOSIS — N186 End stage renal disease: Secondary | ICD-10-CM | POA: Diagnosis not present

## 2023-05-05 DIAGNOSIS — I10 Essential (primary) hypertension: Secondary | ICD-10-CM | POA: Diagnosis not present

## 2023-05-05 DIAGNOSIS — J9611 Chronic respiratory failure with hypoxia: Secondary | ICD-10-CM | POA: Diagnosis not present

## 2023-05-06 DIAGNOSIS — N2581 Secondary hyperparathyroidism of renal origin: Secondary | ICD-10-CM | POA: Diagnosis not present

## 2023-05-06 DIAGNOSIS — Z992 Dependence on renal dialysis: Secondary | ICD-10-CM | POA: Diagnosis not present

## 2023-05-06 DIAGNOSIS — D631 Anemia in chronic kidney disease: Secondary | ICD-10-CM | POA: Diagnosis not present

## 2023-05-06 DIAGNOSIS — N186 End stage renal disease: Secondary | ICD-10-CM | POA: Diagnosis not present

## 2023-05-07 DIAGNOSIS — M1009 Idiopathic gout, multiple sites: Secondary | ICD-10-CM | POA: Diagnosis not present

## 2023-05-07 DIAGNOSIS — N186 End stage renal disease: Secondary | ICD-10-CM | POA: Diagnosis not present

## 2023-05-07 DIAGNOSIS — I10 Essential (primary) hypertension: Secondary | ICD-10-CM | POA: Diagnosis not present

## 2023-05-07 DIAGNOSIS — J9611 Chronic respiratory failure with hypoxia: Secondary | ICD-10-CM | POA: Diagnosis not present

## 2023-05-08 DIAGNOSIS — N186 End stage renal disease: Secondary | ICD-10-CM | POA: Diagnosis not present

## 2023-05-08 DIAGNOSIS — Z992 Dependence on renal dialysis: Secondary | ICD-10-CM | POA: Diagnosis not present

## 2023-05-08 DIAGNOSIS — N2581 Secondary hyperparathyroidism of renal origin: Secondary | ICD-10-CM | POA: Diagnosis not present

## 2023-05-08 DIAGNOSIS — D631 Anemia in chronic kidney disease: Secondary | ICD-10-CM | POA: Diagnosis not present

## 2023-05-11 DIAGNOSIS — D631 Anemia in chronic kidney disease: Secondary | ICD-10-CM | POA: Diagnosis not present

## 2023-05-11 DIAGNOSIS — Z992 Dependence on renal dialysis: Secondary | ICD-10-CM | POA: Diagnosis not present

## 2023-05-11 DIAGNOSIS — N2581 Secondary hyperparathyroidism of renal origin: Secondary | ICD-10-CM | POA: Diagnosis not present

## 2023-05-11 DIAGNOSIS — N186 End stage renal disease: Secondary | ICD-10-CM | POA: Diagnosis not present

## 2023-05-13 DIAGNOSIS — N186 End stage renal disease: Secondary | ICD-10-CM | POA: Diagnosis not present

## 2023-05-13 DIAGNOSIS — D631 Anemia in chronic kidney disease: Secondary | ICD-10-CM | POA: Diagnosis not present

## 2023-05-13 DIAGNOSIS — N2581 Secondary hyperparathyroidism of renal origin: Secondary | ICD-10-CM | POA: Diagnosis not present

## 2023-05-13 DIAGNOSIS — Z992 Dependence on renal dialysis: Secondary | ICD-10-CM | POA: Diagnosis not present

## 2023-05-14 DIAGNOSIS — J9611 Chronic respiratory failure with hypoxia: Secondary | ICD-10-CM | POA: Diagnosis not present

## 2023-05-14 DIAGNOSIS — M1009 Idiopathic gout, multiple sites: Secondary | ICD-10-CM | POA: Diagnosis not present

## 2023-05-14 DIAGNOSIS — N186 End stage renal disease: Secondary | ICD-10-CM | POA: Diagnosis not present

## 2023-05-14 DIAGNOSIS — I10 Essential (primary) hypertension: Secondary | ICD-10-CM | POA: Diagnosis not present

## 2023-05-18 DIAGNOSIS — D631 Anemia in chronic kidney disease: Secondary | ICD-10-CM | POA: Diagnosis not present

## 2023-05-18 DIAGNOSIS — N2581 Secondary hyperparathyroidism of renal origin: Secondary | ICD-10-CM | POA: Diagnosis not present

## 2023-05-18 DIAGNOSIS — Z992 Dependence on renal dialysis: Secondary | ICD-10-CM | POA: Diagnosis not present

## 2023-05-18 DIAGNOSIS — N186 End stage renal disease: Secondary | ICD-10-CM | POA: Diagnosis not present

## 2023-05-20 DIAGNOSIS — Z992 Dependence on renal dialysis: Secondary | ICD-10-CM | POA: Diagnosis not present

## 2023-05-20 DIAGNOSIS — N186 End stage renal disease: Secondary | ICD-10-CM | POA: Diagnosis not present

## 2023-05-20 DIAGNOSIS — N2581 Secondary hyperparathyroidism of renal origin: Secondary | ICD-10-CM | POA: Diagnosis not present

## 2023-05-20 DIAGNOSIS — D631 Anemia in chronic kidney disease: Secondary | ICD-10-CM | POA: Diagnosis not present

## 2023-05-21 DIAGNOSIS — N186 End stage renal disease: Secondary | ICD-10-CM | POA: Diagnosis not present

## 2023-05-21 DIAGNOSIS — G4700 Insomnia, unspecified: Secondary | ICD-10-CM | POA: Diagnosis not present

## 2023-05-21 DIAGNOSIS — I10 Essential (primary) hypertension: Secondary | ICD-10-CM | POA: Diagnosis not present

## 2023-05-21 DIAGNOSIS — F339 Major depressive disorder, recurrent, unspecified: Secondary | ICD-10-CM | POA: Diagnosis not present

## 2023-05-21 DIAGNOSIS — J9611 Chronic respiratory failure with hypoxia: Secondary | ICD-10-CM | POA: Diagnosis not present

## 2023-05-21 DIAGNOSIS — M1009 Idiopathic gout, multiple sites: Secondary | ICD-10-CM | POA: Diagnosis not present

## 2023-05-22 DIAGNOSIS — N186 End stage renal disease: Secondary | ICD-10-CM | POA: Diagnosis not present

## 2023-05-22 DIAGNOSIS — Z992 Dependence on renal dialysis: Secondary | ICD-10-CM | POA: Diagnosis not present

## 2023-05-22 DIAGNOSIS — N2581 Secondary hyperparathyroidism of renal origin: Secondary | ICD-10-CM | POA: Diagnosis not present

## 2023-05-22 DIAGNOSIS — D631 Anemia in chronic kidney disease: Secondary | ICD-10-CM | POA: Diagnosis not present

## 2023-05-25 DIAGNOSIS — N2581 Secondary hyperparathyroidism of renal origin: Secondary | ICD-10-CM | POA: Diagnosis not present

## 2023-05-25 DIAGNOSIS — Z992 Dependence on renal dialysis: Secondary | ICD-10-CM | POA: Diagnosis not present

## 2023-05-25 DIAGNOSIS — D631 Anemia in chronic kidney disease: Secondary | ICD-10-CM | POA: Diagnosis not present

## 2023-05-25 DIAGNOSIS — N186 End stage renal disease: Secondary | ICD-10-CM | POA: Diagnosis not present

## 2023-05-27 DIAGNOSIS — N186 End stage renal disease: Secondary | ICD-10-CM | POA: Diagnosis not present

## 2023-05-27 DIAGNOSIS — N2581 Secondary hyperparathyroidism of renal origin: Secondary | ICD-10-CM | POA: Diagnosis not present

## 2023-05-27 DIAGNOSIS — D631 Anemia in chronic kidney disease: Secondary | ICD-10-CM | POA: Diagnosis not present

## 2023-05-27 DIAGNOSIS — Z992 Dependence on renal dialysis: Secondary | ICD-10-CM | POA: Diagnosis not present

## 2023-05-29 DIAGNOSIS — Z992 Dependence on renal dialysis: Secondary | ICD-10-CM | POA: Diagnosis not present

## 2023-05-29 DIAGNOSIS — N186 End stage renal disease: Secondary | ICD-10-CM | POA: Diagnosis not present

## 2023-05-29 DIAGNOSIS — N2581 Secondary hyperparathyroidism of renal origin: Secondary | ICD-10-CM | POA: Diagnosis not present

## 2023-05-29 DIAGNOSIS — D631 Anemia in chronic kidney disease: Secondary | ICD-10-CM | POA: Diagnosis not present

## 2023-05-30 DIAGNOSIS — N186 End stage renal disease: Secondary | ICD-10-CM | POA: Diagnosis not present

## 2023-06-01 DIAGNOSIS — N186 End stage renal disease: Secondary | ICD-10-CM | POA: Diagnosis not present

## 2023-06-01 DIAGNOSIS — D631 Anemia in chronic kidney disease: Secondary | ICD-10-CM | POA: Diagnosis not present

## 2023-06-01 DIAGNOSIS — N2581 Secondary hyperparathyroidism of renal origin: Secondary | ICD-10-CM | POA: Diagnosis not present

## 2023-06-01 DIAGNOSIS — Z992 Dependence on renal dialysis: Secondary | ICD-10-CM | POA: Diagnosis not present

## 2023-06-02 DIAGNOSIS — S14109A Unspecified injury at unspecified level of cervical spinal cord, initial encounter: Secondary | ICD-10-CM | POA: Diagnosis not present

## 2023-06-02 DIAGNOSIS — I950 Idiopathic hypotension: Secondary | ICD-10-CM | POA: Diagnosis not present

## 2023-06-02 DIAGNOSIS — N186 End stage renal disease: Secondary | ICD-10-CM | POA: Diagnosis present

## 2023-06-02 DIAGNOSIS — Z6841 Body Mass Index (BMI) 40.0 and over, adult: Secondary | ICD-10-CM | POA: Diagnosis not present

## 2023-06-02 DIAGNOSIS — R29818 Other symptoms and signs involving the nervous system: Secondary | ICD-10-CM | POA: Diagnosis not present

## 2023-06-02 DIAGNOSIS — R531 Weakness: Secondary | ICD-10-CM | POA: Diagnosis not present

## 2023-06-02 DIAGNOSIS — N390 Urinary tract infection, site not specified: Secondary | ICD-10-CM | POA: Diagnosis not present

## 2023-06-02 DIAGNOSIS — K760 Fatty (change of) liver, not elsewhere classified: Secondary | ICD-10-CM | POA: Diagnosis not present

## 2023-06-02 DIAGNOSIS — R339 Retention of urine, unspecified: Secondary | ICD-10-CM | POA: Diagnosis present

## 2023-06-02 DIAGNOSIS — A0471 Enterocolitis due to Clostridium difficile, recurrent: Secondary | ICD-10-CM | POA: Diagnosis present

## 2023-06-02 DIAGNOSIS — L89153 Pressure ulcer of sacral region, stage 3: Secondary | ICD-10-CM | POA: Diagnosis not present

## 2023-06-02 DIAGNOSIS — M4624 Osteomyelitis of vertebra, thoracic region: Secondary | ICD-10-CM | POA: Diagnosis not present

## 2023-06-02 DIAGNOSIS — S12350A Other traumatic displaced spondylolisthesis of fourth cervical vertebra, initial encounter for closed fracture: Secondary | ICD-10-CM | POA: Diagnosis not present

## 2023-06-02 DIAGNOSIS — Z6839 Body mass index (BMI) 39.0-39.9, adult: Secondary | ICD-10-CM | POA: Diagnosis not present

## 2023-06-02 DIAGNOSIS — R112 Nausea with vomiting, unspecified: Secondary | ICD-10-CM | POA: Diagnosis not present

## 2023-06-02 DIAGNOSIS — E873 Alkalosis: Secondary | ICD-10-CM | POA: Diagnosis not present

## 2023-06-02 DIAGNOSIS — M4802 Spinal stenosis, cervical region: Secondary | ICD-10-CM | POA: Diagnosis not present

## 2023-06-02 DIAGNOSIS — J9611 Chronic respiratory failure with hypoxia: Secondary | ICD-10-CM | POA: Diagnosis present

## 2023-06-02 DIAGNOSIS — K858 Other acute pancreatitis without necrosis or infection: Secondary | ICD-10-CM | POA: Diagnosis present

## 2023-06-02 DIAGNOSIS — R109 Unspecified abdominal pain: Secondary | ICD-10-CM | POA: Diagnosis not present

## 2023-06-02 DIAGNOSIS — M542 Cervicalgia: Secondary | ICD-10-CM | POA: Diagnosis not present

## 2023-06-02 DIAGNOSIS — R10817 Generalized abdominal tenderness: Secondary | ICD-10-CM | POA: Diagnosis not present

## 2023-06-02 DIAGNOSIS — N320 Bladder-neck obstruction: Secondary | ICD-10-CM | POA: Diagnosis present

## 2023-06-02 DIAGNOSIS — B965 Pseudomonas (aeruginosa) (mallei) (pseudomallei) as the cause of diseases classified elsewhere: Secondary | ICD-10-CM | POA: Diagnosis not present

## 2023-06-02 DIAGNOSIS — N3091 Cystitis, unspecified with hematuria: Secondary | ICD-10-CM | POA: Diagnosis not present

## 2023-06-02 DIAGNOSIS — L8943 Pressure ulcer of contiguous site of back, buttock and hip, stage 3: Secondary | ICD-10-CM | POA: Diagnosis not present

## 2023-06-02 DIAGNOSIS — R0689 Other abnormalities of breathing: Secondary | ICD-10-CM | POA: Diagnosis not present

## 2023-06-02 DIAGNOSIS — Z91158 Patient's noncompliance with renal dialysis for other reason: Secondary | ICD-10-CM | POA: Diagnosis not present

## 2023-06-02 DIAGNOSIS — A414 Sepsis due to anaerobes: Secondary | ICD-10-CM | POA: Diagnosis present

## 2023-06-02 DIAGNOSIS — J9612 Chronic respiratory failure with hypercapnia: Secondary | ICD-10-CM | POA: Diagnosis not present

## 2023-06-02 DIAGNOSIS — R310 Gross hematuria: Secondary | ICD-10-CM | POA: Diagnosis not present

## 2023-06-02 DIAGNOSIS — G2581 Restless legs syndrome: Secondary | ICD-10-CM | POA: Diagnosis not present

## 2023-06-02 DIAGNOSIS — K859 Acute pancreatitis without necrosis or infection, unspecified: Secondary | ICD-10-CM | POA: Diagnosis not present

## 2023-06-02 DIAGNOSIS — D62 Acute posthemorrhagic anemia: Secondary | ICD-10-CM | POA: Diagnosis not present

## 2023-06-02 DIAGNOSIS — Z20822 Contact with and (suspected) exposure to covid-19: Secondary | ICD-10-CM | POA: Diagnosis present

## 2023-06-02 DIAGNOSIS — Z7401 Bed confinement status: Secondary | ICD-10-CM | POA: Diagnosis not present

## 2023-06-02 DIAGNOSIS — G8220 Paraplegia, unspecified: Secondary | ICD-10-CM | POA: Diagnosis present

## 2023-06-02 DIAGNOSIS — G629 Polyneuropathy, unspecified: Secondary | ICD-10-CM | POA: Diagnosis present

## 2023-06-02 DIAGNOSIS — J449 Chronic obstructive pulmonary disease, unspecified: Secondary | ICD-10-CM | POA: Diagnosis not present

## 2023-06-02 DIAGNOSIS — Z992 Dependence on renal dialysis: Secondary | ICD-10-CM | POA: Diagnosis not present

## 2023-06-02 DIAGNOSIS — I5032 Chronic diastolic (congestive) heart failure: Secondary | ICD-10-CM | POA: Diagnosis not present

## 2023-06-02 DIAGNOSIS — I120 Hypertensive chronic kidney disease with stage 5 chronic kidney disease or end stage renal disease: Secondary | ICD-10-CM | POA: Diagnosis present

## 2023-06-02 DIAGNOSIS — E669 Obesity, unspecified: Secondary | ICD-10-CM | POA: Diagnosis not present

## 2023-06-02 DIAGNOSIS — D631 Anemia in chronic kidney disease: Secondary | ICD-10-CM | POA: Diagnosis present

## 2023-06-02 DIAGNOSIS — R6520 Severe sepsis without septic shock: Secondary | ICD-10-CM | POA: Diagnosis present

## 2023-06-02 DIAGNOSIS — I959 Hypotension, unspecified: Secondary | ICD-10-CM | POA: Diagnosis not present

## 2023-06-02 DIAGNOSIS — S12250A Other traumatic displaced spondylolisthesis of third cervical vertebra, initial encounter for closed fracture: Secondary | ICD-10-CM | POA: Diagnosis not present

## 2023-06-02 DIAGNOSIS — R601 Generalized edema: Secondary | ICD-10-CM | POA: Diagnosis not present

## 2023-06-02 DIAGNOSIS — Z1623 Resistance to quinolones and fluoroquinolones: Secondary | ICD-10-CM | POA: Diagnosis not present

## 2023-06-02 DIAGNOSIS — G894 Chronic pain syndrome: Secondary | ICD-10-CM | POA: Diagnosis present

## 2023-06-02 DIAGNOSIS — R197 Diarrhea, unspecified: Secondary | ICD-10-CM | POA: Diagnosis not present

## 2023-06-02 DIAGNOSIS — A0472 Enterocolitis due to Clostridium difficile, not specified as recurrent: Secondary | ICD-10-CM | POA: Diagnosis not present

## 2023-06-02 DIAGNOSIS — N3080 Other cystitis without hematuria: Secondary | ICD-10-CM | POA: Diagnosis not present

## 2023-06-02 DIAGNOSIS — A419 Sepsis, unspecified organism: Secondary | ICD-10-CM | POA: Diagnosis not present

## 2023-06-02 DIAGNOSIS — G992 Myelopathy in diseases classified elsewhere: Secondary | ICD-10-CM | POA: Diagnosis not present

## 2023-06-02 DIAGNOSIS — E785 Hyperlipidemia, unspecified: Secondary | ICD-10-CM | POA: Diagnosis not present

## 2023-06-02 DIAGNOSIS — I132 Hypertensive heart and chronic kidney disease with heart failure and with stage 5 chronic kidney disease, or end stage renal disease: Secondary | ICD-10-CM | POA: Diagnosis not present

## 2023-06-02 DIAGNOSIS — I953 Hypotension of hemodialysis: Secondary | ICD-10-CM | POA: Diagnosis not present

## 2023-06-03 DIAGNOSIS — R601 Generalized edema: Secondary | ICD-10-CM | POA: Diagnosis not present

## 2023-06-03 DIAGNOSIS — Z992 Dependence on renal dialysis: Secondary | ICD-10-CM | POA: Diagnosis not present

## 2023-06-03 DIAGNOSIS — K859 Acute pancreatitis without necrosis or infection, unspecified: Secondary | ICD-10-CM | POA: Diagnosis not present

## 2023-06-03 DIAGNOSIS — R112 Nausea with vomiting, unspecified: Secondary | ICD-10-CM | POA: Diagnosis not present

## 2023-06-03 DIAGNOSIS — S14109A Unspecified injury at unspecified level of cervical spinal cord, initial encounter: Secondary | ICD-10-CM | POA: Diagnosis not present

## 2023-06-03 DIAGNOSIS — L8943 Pressure ulcer of contiguous site of back, buttock and hip, stage 3: Secondary | ICD-10-CM | POA: Diagnosis not present

## 2023-06-03 DIAGNOSIS — R310 Gross hematuria: Secondary | ICD-10-CM | POA: Diagnosis not present

## 2023-06-03 DIAGNOSIS — Z7401 Bed confinement status: Secondary | ICD-10-CM | POA: Diagnosis not present

## 2023-06-03 DIAGNOSIS — J9611 Chronic respiratory failure with hypoxia: Secondary | ICD-10-CM | POA: Diagnosis present

## 2023-06-03 DIAGNOSIS — R6520 Severe sepsis without septic shock: Secondary | ICD-10-CM | POA: Diagnosis present

## 2023-06-03 DIAGNOSIS — A0472 Enterocolitis due to Clostridium difficile, not specified as recurrent: Secondary | ICD-10-CM | POA: Diagnosis not present

## 2023-06-03 DIAGNOSIS — R339 Retention of urine, unspecified: Secondary | ICD-10-CM | POA: Diagnosis present

## 2023-06-03 DIAGNOSIS — Z20822 Contact with and (suspected) exposure to covid-19: Secondary | ICD-10-CM | POA: Diagnosis present

## 2023-06-03 DIAGNOSIS — I959 Hypotension, unspecified: Secondary | ICD-10-CM | POA: Diagnosis not present

## 2023-06-03 DIAGNOSIS — M4624 Osteomyelitis of vertebra, thoracic region: Secondary | ICD-10-CM | POA: Diagnosis not present

## 2023-06-03 DIAGNOSIS — K858 Other acute pancreatitis without necrosis or infection: Secondary | ICD-10-CM | POA: Diagnosis present

## 2023-06-03 DIAGNOSIS — R0689 Other abnormalities of breathing: Secondary | ICD-10-CM | POA: Diagnosis not present

## 2023-06-03 DIAGNOSIS — R109 Unspecified abdominal pain: Secondary | ICD-10-CM | POA: Diagnosis not present

## 2023-06-03 DIAGNOSIS — N320 Bladder-neck obstruction: Secondary | ICD-10-CM | POA: Diagnosis present

## 2023-06-03 DIAGNOSIS — S12350A Other traumatic displaced spondylolisthesis of fourth cervical vertebra, initial encounter for closed fracture: Secondary | ICD-10-CM | POA: Diagnosis not present

## 2023-06-03 DIAGNOSIS — M542 Cervicalgia: Secondary | ICD-10-CM | POA: Diagnosis not present

## 2023-06-03 DIAGNOSIS — S12250A Other traumatic displaced spondylolisthesis of third cervical vertebra, initial encounter for closed fracture: Secondary | ICD-10-CM | POA: Diagnosis not present

## 2023-06-03 DIAGNOSIS — G894 Chronic pain syndrome: Secondary | ICD-10-CM | POA: Diagnosis present

## 2023-06-03 DIAGNOSIS — I120 Hypertensive chronic kidney disease with stage 5 chronic kidney disease or end stage renal disease: Secondary | ICD-10-CM | POA: Diagnosis present

## 2023-06-03 DIAGNOSIS — D631 Anemia in chronic kidney disease: Secondary | ICD-10-CM | POA: Diagnosis present

## 2023-06-03 DIAGNOSIS — A414 Sepsis due to anaerobes: Secondary | ICD-10-CM | POA: Diagnosis present

## 2023-06-03 DIAGNOSIS — E873 Alkalosis: Secondary | ICD-10-CM | POA: Diagnosis not present

## 2023-06-03 DIAGNOSIS — I953 Hypotension of hemodialysis: Secondary | ICD-10-CM | POA: Diagnosis not present

## 2023-06-03 DIAGNOSIS — A419 Sepsis, unspecified organism: Secondary | ICD-10-CM | POA: Diagnosis not present

## 2023-06-03 DIAGNOSIS — N3091 Cystitis, unspecified with hematuria: Secondary | ICD-10-CM | POA: Diagnosis not present

## 2023-06-03 DIAGNOSIS — A0471 Enterocolitis due to Clostridium difficile, recurrent: Secondary | ICD-10-CM | POA: Diagnosis present

## 2023-06-03 DIAGNOSIS — R10817 Generalized abdominal tenderness: Secondary | ICD-10-CM | POA: Diagnosis not present

## 2023-06-03 DIAGNOSIS — G8220 Paraplegia, unspecified: Secondary | ICD-10-CM | POA: Diagnosis present

## 2023-06-03 DIAGNOSIS — R197 Diarrhea, unspecified: Secondary | ICD-10-CM | POA: Diagnosis not present

## 2023-06-03 DIAGNOSIS — D62 Acute posthemorrhagic anemia: Secondary | ICD-10-CM | POA: Diagnosis not present

## 2023-06-03 DIAGNOSIS — G629 Polyneuropathy, unspecified: Secondary | ICD-10-CM | POA: Diagnosis present

## 2023-06-03 DIAGNOSIS — M4802 Spinal stenosis, cervical region: Secondary | ICD-10-CM | POA: Diagnosis not present

## 2023-06-03 DIAGNOSIS — Z91158 Patient's noncompliance with renal dialysis for other reason: Secondary | ICD-10-CM | POA: Diagnosis not present

## 2023-06-03 DIAGNOSIS — R29818 Other symptoms and signs involving the nervous system: Secondary | ICD-10-CM | POA: Diagnosis not present

## 2023-06-03 DIAGNOSIS — N186 End stage renal disease: Secondary | ICD-10-CM | POA: Diagnosis present

## 2023-06-03 DIAGNOSIS — Z6841 Body Mass Index (BMI) 40.0 and over, adult: Secondary | ICD-10-CM | POA: Diagnosis not present

## 2023-06-04 DIAGNOSIS — R197 Diarrhea, unspecified: Secondary | ICD-10-CM | POA: Diagnosis not present

## 2023-06-04 DIAGNOSIS — R112 Nausea with vomiting, unspecified: Secondary | ICD-10-CM | POA: Diagnosis not present

## 2023-06-05 DIAGNOSIS — R197 Diarrhea, unspecified: Secondary | ICD-10-CM | POA: Diagnosis not present

## 2023-06-05 DIAGNOSIS — R112 Nausea with vomiting, unspecified: Secondary | ICD-10-CM | POA: Diagnosis not present

## 2023-06-06 DIAGNOSIS — R310 Gross hematuria: Secondary | ICD-10-CM | POA: Diagnosis not present

## 2023-06-06 DIAGNOSIS — R197 Diarrhea, unspecified: Secondary | ICD-10-CM | POA: Diagnosis not present

## 2023-06-06 DIAGNOSIS — R112 Nausea with vomiting, unspecified: Secondary | ICD-10-CM | POA: Diagnosis not present

## 2023-06-07 DIAGNOSIS — N186 End stage renal disease: Secondary | ICD-10-CM | POA: Diagnosis not present

## 2023-06-07 DIAGNOSIS — A0472 Enterocolitis due to Clostridium difficile, not specified as recurrent: Secondary | ICD-10-CM | POA: Diagnosis not present

## 2023-06-07 DIAGNOSIS — K859 Acute pancreatitis without necrosis or infection, unspecified: Secondary | ICD-10-CM | POA: Diagnosis not present

## 2023-06-07 DIAGNOSIS — Z992 Dependence on renal dialysis: Secondary | ICD-10-CM | POA: Diagnosis not present

## 2023-06-08 DIAGNOSIS — N186 End stage renal disease: Secondary | ICD-10-CM | POA: Diagnosis not present

## 2023-06-08 DIAGNOSIS — A0472 Enterocolitis due to Clostridium difficile, not specified as recurrent: Secondary | ICD-10-CM | POA: Diagnosis not present

## 2023-06-08 DIAGNOSIS — R10817 Generalized abdominal tenderness: Secondary | ICD-10-CM | POA: Diagnosis not present

## 2023-06-08 DIAGNOSIS — Z992 Dependence on renal dialysis: Secondary | ICD-10-CM | POA: Diagnosis not present

## 2023-06-09 DIAGNOSIS — N186 End stage renal disease: Secondary | ICD-10-CM | POA: Diagnosis not present

## 2023-06-09 DIAGNOSIS — A0472 Enterocolitis due to Clostridium difficile, not specified as recurrent: Secondary | ICD-10-CM | POA: Diagnosis not present

## 2023-06-09 DIAGNOSIS — R10817 Generalized abdominal tenderness: Secondary | ICD-10-CM | POA: Diagnosis not present

## 2023-06-09 DIAGNOSIS — Z992 Dependence on renal dialysis: Secondary | ICD-10-CM | POA: Diagnosis not present

## 2023-06-10 DIAGNOSIS — N186 End stage renal disease: Secondary | ICD-10-CM | POA: Diagnosis not present

## 2023-06-10 DIAGNOSIS — R112 Nausea with vomiting, unspecified: Secondary | ICD-10-CM | POA: Diagnosis not present

## 2023-06-10 DIAGNOSIS — R197 Diarrhea, unspecified: Secondary | ICD-10-CM | POA: Diagnosis not present

## 2023-06-13 DIAGNOSIS — R197 Diarrhea, unspecified: Secondary | ICD-10-CM | POA: Diagnosis not present

## 2023-06-13 DIAGNOSIS — L8943 Pressure ulcer of contiguous site of back, buttock and hip, stage 3: Secondary | ICD-10-CM | POA: Diagnosis not present

## 2023-06-13 DIAGNOSIS — M109 Gout, unspecified: Secondary | ICD-10-CM | POA: Diagnosis not present

## 2023-06-13 DIAGNOSIS — Z4682 Encounter for fitting and adjustment of non-vascular catheter: Secondary | ICD-10-CM | POA: Diagnosis not present

## 2023-06-13 DIAGNOSIS — M4712 Other spondylosis with myelopathy, cervical region: Secondary | ICD-10-CM | POA: Diagnosis not present

## 2023-06-13 DIAGNOSIS — S14109A Unspecified injury at unspecified level of cervical spinal cord, initial encounter: Secondary | ICD-10-CM | POA: Diagnosis not present

## 2023-06-13 DIAGNOSIS — A0471 Enterocolitis due to Clostridium difficile, recurrent: Secondary | ICD-10-CM | POA: Diagnosis not present

## 2023-06-13 DIAGNOSIS — I950 Idiopathic hypotension: Secondary | ICD-10-CM | POA: Diagnosis not present

## 2023-06-13 DIAGNOSIS — E669 Obesity, unspecified: Secondary | ICD-10-CM | POA: Diagnosis present

## 2023-06-13 DIAGNOSIS — R200 Anesthesia of skin: Secondary | ICD-10-CM | POA: Diagnosis not present

## 2023-06-13 DIAGNOSIS — D638 Anemia in other chronic diseases classified elsewhere: Secondary | ICD-10-CM | POA: Diagnosis not present

## 2023-06-13 DIAGNOSIS — G9529 Other cord compression: Secondary | ICD-10-CM | POA: Diagnosis not present

## 2023-06-13 DIAGNOSIS — I5032 Chronic diastolic (congestive) heart failure: Secondary | ICD-10-CM | POA: Diagnosis present

## 2023-06-13 DIAGNOSIS — Z1623 Resistance to quinolones and fluoroquinolones: Secondary | ICD-10-CM | POA: Diagnosis not present

## 2023-06-13 DIAGNOSIS — N186 End stage renal disease: Secondary | ICD-10-CM | POA: Diagnosis not present

## 2023-06-13 DIAGNOSIS — Z992 Dependence on renal dialysis: Secondary | ICD-10-CM | POA: Diagnosis not present

## 2023-06-13 DIAGNOSIS — R112 Nausea with vomiting, unspecified: Secondary | ICD-10-CM | POA: Diagnosis not present

## 2023-06-13 DIAGNOSIS — R918 Other nonspecific abnormal finding of lung field: Secondary | ICD-10-CM | POA: Diagnosis not present

## 2023-06-13 DIAGNOSIS — Z6837 Body mass index (BMI) 37.0-37.9, adult: Secondary | ICD-10-CM | POA: Diagnosis not present

## 2023-06-13 DIAGNOSIS — K859 Acute pancreatitis without necrosis or infection, unspecified: Secondary | ICD-10-CM | POA: Diagnosis not present

## 2023-06-13 DIAGNOSIS — N390 Urinary tract infection, site not specified: Secondary | ICD-10-CM | POA: Diagnosis not present

## 2023-06-13 DIAGNOSIS — M4312 Spondylolisthesis, cervical region: Secondary | ICD-10-CM | POA: Diagnosis not present

## 2023-06-13 DIAGNOSIS — J449 Chronic obstructive pulmonary disease, unspecified: Secondary | ICD-10-CM | POA: Diagnosis present

## 2023-06-13 DIAGNOSIS — A0472 Enterocolitis due to Clostridium difficile, not specified as recurrent: Secondary | ICD-10-CM | POA: Diagnosis not present

## 2023-06-13 DIAGNOSIS — Z981 Arthrodesis status: Secondary | ICD-10-CM | POA: Diagnosis not present

## 2023-06-13 DIAGNOSIS — I120 Hypertensive chronic kidney disease with stage 5 chronic kidney disease or end stage renal disease: Secondary | ICD-10-CM | POA: Diagnosis not present

## 2023-06-13 DIAGNOSIS — Z79891 Long term (current) use of opiate analgesic: Secondary | ICD-10-CM | POA: Diagnosis not present

## 2023-06-13 DIAGNOSIS — R202 Paresthesia of skin: Secondary | ICD-10-CM | POA: Diagnosis not present

## 2023-06-13 DIAGNOSIS — I132 Hypertensive heart and chronic kidney disease with heart failure and with stage 5 chronic kidney disease, or end stage renal disease: Secondary | ICD-10-CM | POA: Diagnosis present

## 2023-06-13 DIAGNOSIS — L89153 Pressure ulcer of sacral region, stage 3: Secondary | ICD-10-CM | POA: Diagnosis not present

## 2023-06-13 DIAGNOSIS — E878 Other disorders of electrolyte and fluid balance, not elsewhere classified: Secondary | ICD-10-CM | POA: Diagnosis not present

## 2023-06-13 DIAGNOSIS — B965 Pseudomonas (aeruginosa) (mallei) (pseudomallei) as the cause of diseases classified elsewhere: Secondary | ICD-10-CM | POA: Diagnosis not present

## 2023-06-13 DIAGNOSIS — R0689 Other abnormalities of breathing: Secondary | ICD-10-CM | POA: Diagnosis not present

## 2023-06-13 DIAGNOSIS — G8220 Paraplegia, unspecified: Secondary | ICD-10-CM | POA: Diagnosis not present

## 2023-06-13 DIAGNOSIS — J9612 Chronic respiratory failure with hypercapnia: Secondary | ICD-10-CM | POA: Diagnosis not present

## 2023-06-13 DIAGNOSIS — K760 Fatty (change of) liver, not elsewhere classified: Secondary | ICD-10-CM | POA: Diagnosis present

## 2023-06-13 DIAGNOSIS — G9520 Unspecified cord compression: Secondary | ICD-10-CM | POA: Diagnosis not present

## 2023-06-13 DIAGNOSIS — G992 Myelopathy in diseases classified elsewhere: Secondary | ICD-10-CM | POA: Diagnosis present

## 2023-06-13 DIAGNOSIS — Z743 Need for continuous supervision: Secondary | ICD-10-CM | POA: Diagnosis not present

## 2023-06-13 DIAGNOSIS — X58XXXA Exposure to other specified factors, initial encounter: Secondary | ICD-10-CM | POA: Diagnosis not present

## 2023-06-13 DIAGNOSIS — Z9981 Dependence on supplemental oxygen: Secondary | ICD-10-CM | POA: Diagnosis not present

## 2023-06-13 DIAGNOSIS — M4722 Other spondylosis with radiculopathy, cervical region: Secondary | ICD-10-CM | POA: Diagnosis not present

## 2023-06-13 DIAGNOSIS — M4802 Spinal stenosis, cervical region: Secondary | ICD-10-CM | POA: Diagnosis present

## 2023-06-13 DIAGNOSIS — M50321 Other cervical disc degeneration at C4-C5 level: Secondary | ICD-10-CM | POA: Diagnosis not present

## 2023-06-13 DIAGNOSIS — E785 Hyperlipidemia, unspecified: Secondary | ICD-10-CM | POA: Diagnosis present

## 2023-06-13 DIAGNOSIS — E871 Hypo-osmolality and hyponatremia: Secondary | ICD-10-CM | POA: Diagnosis not present

## 2023-06-13 DIAGNOSIS — M4624 Osteomyelitis of vertebra, thoracic region: Secondary | ICD-10-CM | POA: Diagnosis not present

## 2023-06-13 DIAGNOSIS — Z6839 Body mass index (BMI) 39.0-39.9, adult: Secondary | ICD-10-CM | POA: Diagnosis not present

## 2023-06-13 DIAGNOSIS — S12350A Other traumatic displaced spondylolisthesis of fourth cervical vertebra, initial encounter for closed fracture: Secondary | ICD-10-CM | POA: Diagnosis not present

## 2023-06-13 DIAGNOSIS — D631 Anemia in chronic kidney disease: Secondary | ICD-10-CM | POA: Diagnosis not present

## 2023-06-13 DIAGNOSIS — S12250A Other traumatic displaced spondylolisthesis of third cervical vertebra, initial encounter for closed fracture: Secondary | ICD-10-CM | POA: Diagnosis not present

## 2023-06-13 DIAGNOSIS — S199XXA Unspecified injury of neck, initial encounter: Secondary | ICD-10-CM | POA: Diagnosis not present

## 2023-06-13 DIAGNOSIS — D62 Acute posthemorrhagic anemia: Secondary | ICD-10-CM | POA: Diagnosis not present

## 2023-06-13 DIAGNOSIS — R279 Unspecified lack of coordination: Secondary | ICD-10-CM | POA: Diagnosis not present

## 2023-06-13 DIAGNOSIS — R1310 Dysphagia, unspecified: Secondary | ICD-10-CM | POA: Diagnosis not present

## 2023-06-13 DIAGNOSIS — J9611 Chronic respiratory failure with hypoxia: Secondary | ICD-10-CM | POA: Diagnosis not present

## 2023-06-13 DIAGNOSIS — K219 Gastro-esophageal reflux disease without esophagitis: Secondary | ICD-10-CM | POA: Diagnosis not present

## 2023-06-13 DIAGNOSIS — G2581 Restless legs syndrome: Secondary | ICD-10-CM | POA: Diagnosis present

## 2023-06-13 DIAGNOSIS — G8929 Other chronic pain: Secondary | ICD-10-CM | POA: Diagnosis not present

## 2023-06-14 DIAGNOSIS — G9520 Unspecified cord compression: Secondary | ICD-10-CM | POA: Diagnosis not present

## 2023-06-14 DIAGNOSIS — N186 End stage renal disease: Secondary | ICD-10-CM | POA: Diagnosis not present

## 2023-06-14 DIAGNOSIS — D638 Anemia in other chronic diseases classified elsewhere: Secondary | ICD-10-CM | POA: Diagnosis not present

## 2023-06-14 DIAGNOSIS — G8220 Paraplegia, unspecified: Secondary | ICD-10-CM | POA: Diagnosis not present

## 2023-06-14 DIAGNOSIS — I120 Hypertensive chronic kidney disease with stage 5 chronic kidney disease or end stage renal disease: Secondary | ICD-10-CM | POA: Diagnosis not present

## 2023-06-14 DIAGNOSIS — A0472 Enterocolitis due to Clostridium difficile, not specified as recurrent: Secondary | ICD-10-CM | POA: Diagnosis not present

## 2023-06-14 DIAGNOSIS — Z992 Dependence on renal dialysis: Secondary | ICD-10-CM | POA: Diagnosis not present

## 2023-06-14 DIAGNOSIS — M4802 Spinal stenosis, cervical region: Secondary | ICD-10-CM | POA: Diagnosis not present

## 2023-06-14 DIAGNOSIS — M4624 Osteomyelitis of vertebra, thoracic region: Secondary | ICD-10-CM | POA: Diagnosis not present

## 2023-06-14 DIAGNOSIS — L8943 Pressure ulcer of contiguous site of back, buttock and hip, stage 3: Secondary | ICD-10-CM | POA: Diagnosis not present

## 2023-06-14 DIAGNOSIS — R0689 Other abnormalities of breathing: Secondary | ICD-10-CM | POA: Diagnosis not present

## 2023-06-15 DIAGNOSIS — G9520 Unspecified cord compression: Secondary | ICD-10-CM | POA: Diagnosis not present

## 2023-06-15 DIAGNOSIS — M4802 Spinal stenosis, cervical region: Secondary | ICD-10-CM | POA: Diagnosis not present

## 2023-06-15 DIAGNOSIS — N186 End stage renal disease: Secondary | ICD-10-CM | POA: Diagnosis not present

## 2023-06-15 DIAGNOSIS — R0689 Other abnormalities of breathing: Secondary | ICD-10-CM | POA: Diagnosis not present

## 2023-06-15 DIAGNOSIS — D638 Anemia in other chronic diseases classified elsewhere: Secondary | ICD-10-CM | POA: Diagnosis not present

## 2023-06-15 DIAGNOSIS — A0472 Enterocolitis due to Clostridium difficile, not specified as recurrent: Secondary | ICD-10-CM | POA: Diagnosis not present

## 2023-06-15 DIAGNOSIS — I120 Hypertensive chronic kidney disease with stage 5 chronic kidney disease or end stage renal disease: Secondary | ICD-10-CM | POA: Diagnosis not present

## 2023-06-15 DIAGNOSIS — M4624 Osteomyelitis of vertebra, thoracic region: Secondary | ICD-10-CM | POA: Diagnosis not present

## 2023-06-15 DIAGNOSIS — Z992 Dependence on renal dialysis: Secondary | ICD-10-CM | POA: Diagnosis not present

## 2023-06-15 DIAGNOSIS — L8943 Pressure ulcer of contiguous site of back, buttock and hip, stage 3: Secondary | ICD-10-CM | POA: Diagnosis not present

## 2023-06-15 DIAGNOSIS — G8220 Paraplegia, unspecified: Secondary | ICD-10-CM | POA: Diagnosis not present

## 2023-06-16 DIAGNOSIS — Z992 Dependence on renal dialysis: Secondary | ICD-10-CM | POA: Diagnosis not present

## 2023-06-16 DIAGNOSIS — M4624 Osteomyelitis of vertebra, thoracic region: Secondary | ICD-10-CM | POA: Diagnosis not present

## 2023-06-16 DIAGNOSIS — A0472 Enterocolitis due to Clostridium difficile, not specified as recurrent: Secondary | ICD-10-CM | POA: Diagnosis not present

## 2023-06-16 DIAGNOSIS — M4802 Spinal stenosis, cervical region: Secondary | ICD-10-CM | POA: Diagnosis not present

## 2023-06-16 DIAGNOSIS — R0689 Other abnormalities of breathing: Secondary | ICD-10-CM | POA: Diagnosis not present

## 2023-06-16 DIAGNOSIS — L8943 Pressure ulcer of contiguous site of back, buttock and hip, stage 3: Secondary | ICD-10-CM | POA: Diagnosis not present

## 2023-06-16 DIAGNOSIS — G8220 Paraplegia, unspecified: Secondary | ICD-10-CM | POA: Diagnosis not present

## 2023-06-16 DIAGNOSIS — N186 End stage renal disease: Secondary | ICD-10-CM | POA: Diagnosis not present

## 2023-06-16 DIAGNOSIS — D638 Anemia in other chronic diseases classified elsewhere: Secondary | ICD-10-CM | POA: Diagnosis not present

## 2023-06-17 DIAGNOSIS — L8943 Pressure ulcer of contiguous site of back, buttock and hip, stage 3: Secondary | ICD-10-CM | POA: Diagnosis not present

## 2023-06-17 DIAGNOSIS — M4624 Osteomyelitis of vertebra, thoracic region: Secondary | ICD-10-CM | POA: Diagnosis not present

## 2023-06-17 DIAGNOSIS — M4802 Spinal stenosis, cervical region: Secondary | ICD-10-CM | POA: Diagnosis not present

## 2023-06-17 DIAGNOSIS — A0472 Enterocolitis due to Clostridium difficile, not specified as recurrent: Secondary | ICD-10-CM | POA: Diagnosis not present

## 2023-06-17 DIAGNOSIS — G8220 Paraplegia, unspecified: Secondary | ICD-10-CM | POA: Diagnosis not present

## 2023-06-17 DIAGNOSIS — R0689 Other abnormalities of breathing: Secondary | ICD-10-CM | POA: Diagnosis not present

## 2023-06-17 DIAGNOSIS — Z992 Dependence on renal dialysis: Secondary | ICD-10-CM | POA: Diagnosis not present

## 2023-06-17 DIAGNOSIS — N186 End stage renal disease: Secondary | ICD-10-CM | POA: Diagnosis not present

## 2023-06-17 DIAGNOSIS — D638 Anemia in other chronic diseases classified elsewhere: Secondary | ICD-10-CM | POA: Diagnosis not present

## 2023-06-26 DIAGNOSIS — Z992 Dependence on renal dialysis: Secondary | ICD-10-CM | POA: Diagnosis not present

## 2023-06-26 DIAGNOSIS — N186 End stage renal disease: Secondary | ICD-10-CM | POA: Diagnosis not present

## 2023-06-26 DIAGNOSIS — D631 Anemia in chronic kidney disease: Secondary | ICD-10-CM | POA: Diagnosis not present

## 2023-06-26 DIAGNOSIS — N2581 Secondary hyperparathyroidism of renal origin: Secondary | ICD-10-CM | POA: Diagnosis not present

## 2023-06-29 DIAGNOSIS — Z992 Dependence on renal dialysis: Secondary | ICD-10-CM | POA: Diagnosis not present

## 2023-06-29 DIAGNOSIS — N186 End stage renal disease: Secondary | ICD-10-CM | POA: Diagnosis not present

## 2023-06-29 DIAGNOSIS — N2581 Secondary hyperparathyroidism of renal origin: Secondary | ICD-10-CM | POA: Diagnosis not present

## 2023-06-29 DIAGNOSIS — D631 Anemia in chronic kidney disease: Secondary | ICD-10-CM | POA: Diagnosis not present

## 2023-07-02 DIAGNOSIS — Z992 Dependence on renal dialysis: Secondary | ICD-10-CM | POA: Diagnosis not present

## 2023-07-02 DIAGNOSIS — N2581 Secondary hyperparathyroidism of renal origin: Secondary | ICD-10-CM | POA: Diagnosis not present

## 2023-07-02 DIAGNOSIS — N186 End stage renal disease: Secondary | ICD-10-CM | POA: Diagnosis not present

## 2023-07-02 DIAGNOSIS — D631 Anemia in chronic kidney disease: Secondary | ICD-10-CM | POA: Diagnosis not present

## 2023-07-03 DIAGNOSIS — Z992 Dependence on renal dialysis: Secondary | ICD-10-CM | POA: Diagnosis not present

## 2023-07-03 DIAGNOSIS — N2581 Secondary hyperparathyroidism of renal origin: Secondary | ICD-10-CM | POA: Diagnosis not present

## 2023-07-03 DIAGNOSIS — D631 Anemia in chronic kidney disease: Secondary | ICD-10-CM | POA: Diagnosis not present

## 2023-07-03 DIAGNOSIS — N186 End stage renal disease: Secondary | ICD-10-CM | POA: Diagnosis not present

## 2023-07-06 DIAGNOSIS — N2581 Secondary hyperparathyroidism of renal origin: Secondary | ICD-10-CM | POA: Diagnosis not present

## 2023-07-06 DIAGNOSIS — N186 End stage renal disease: Secondary | ICD-10-CM | POA: Diagnosis not present

## 2023-07-06 DIAGNOSIS — D631 Anemia in chronic kidney disease: Secondary | ICD-10-CM | POA: Diagnosis not present

## 2023-07-06 DIAGNOSIS — Z992 Dependence on renal dialysis: Secondary | ICD-10-CM | POA: Diagnosis not present

## 2023-07-08 DIAGNOSIS — R4182 Altered mental status, unspecified: Secondary | ICD-10-CM | POA: Diagnosis not present

## 2023-07-08 DIAGNOSIS — K760 Fatty (change of) liver, not elsewhere classified: Secondary | ICD-10-CM | POA: Diagnosis not present

## 2023-07-08 DIAGNOSIS — I120 Hypertensive chronic kidney disease with stage 5 chronic kidney disease or end stage renal disease: Secondary | ICD-10-CM | POA: Diagnosis not present

## 2023-07-08 DIAGNOSIS — J810 Acute pulmonary edema: Secondary | ICD-10-CM | POA: Diagnosis not present

## 2023-07-08 DIAGNOSIS — R17 Unspecified jaundice: Secondary | ICD-10-CM | POA: Diagnosis not present

## 2023-07-08 DIAGNOSIS — I959 Hypotension, unspecified: Secondary | ICD-10-CM | POA: Diagnosis not present

## 2023-07-08 DIAGNOSIS — D631 Anemia in chronic kidney disease: Secondary | ICD-10-CM | POA: Diagnosis not present

## 2023-07-08 DIAGNOSIS — R10817 Generalized abdominal tenderness: Secondary | ICD-10-CM | POA: Diagnosis not present

## 2023-07-08 DIAGNOSIS — A419 Sepsis, unspecified organism: Secondary | ICD-10-CM | POA: Diagnosis not present

## 2023-07-08 DIAGNOSIS — N390 Urinary tract infection, site not specified: Secondary | ICD-10-CM | POA: Diagnosis not present

## 2023-07-08 DIAGNOSIS — J9811 Atelectasis: Secondary | ICD-10-CM | POA: Diagnosis not present

## 2023-07-08 DIAGNOSIS — R531 Weakness: Secondary | ICD-10-CM | POA: Diagnosis not present

## 2023-07-08 DIAGNOSIS — N2581 Secondary hyperparathyroidism of renal origin: Secondary | ICD-10-CM | POA: Diagnosis not present

## 2023-07-08 DIAGNOSIS — Z992 Dependence on renal dialysis: Secondary | ICD-10-CM | POA: Diagnosis not present

## 2023-07-08 DIAGNOSIS — N186 End stage renal disease: Secondary | ICD-10-CM | POA: Diagnosis not present

## 2023-07-09 DIAGNOSIS — R17 Unspecified jaundice: Secondary | ICD-10-CM | POA: Diagnosis not present

## 2023-07-09 DIAGNOSIS — J9811 Atelectasis: Secondary | ICD-10-CM | POA: Diagnosis not present

## 2023-07-09 DIAGNOSIS — R059 Cough, unspecified: Secondary | ICD-10-CM | POA: Diagnosis not present

## 2023-07-09 DIAGNOSIS — N1330 Unspecified hydronephrosis: Secondary | ICD-10-CM | POA: Diagnosis not present

## 2023-07-09 DIAGNOSIS — R579 Shock, unspecified: Secondary | ICD-10-CM | POA: Diagnosis not present

## 2023-07-09 DIAGNOSIS — R531 Weakness: Secondary | ICD-10-CM | POA: Diagnosis not present

## 2023-07-09 DIAGNOSIS — K7682 Hepatic encephalopathy: Secondary | ICD-10-CM | POA: Diagnosis not present

## 2023-07-09 DIAGNOSIS — A419 Sepsis, unspecified organism: Secondary | ICD-10-CM | POA: Diagnosis not present

## 2023-07-09 DIAGNOSIS — I10 Essential (primary) hypertension: Secondary | ICD-10-CM | POA: Diagnosis not present

## 2023-07-09 DIAGNOSIS — N3000 Acute cystitis without hematuria: Secondary | ICD-10-CM | POA: Diagnosis not present

## 2023-07-09 DIAGNOSIS — R8281 Pyuria: Secondary | ICD-10-CM | POA: Diagnosis not present

## 2023-07-09 DIAGNOSIS — E861 Hypovolemia: Secondary | ICD-10-CM | POA: Diagnosis not present

## 2023-07-09 DIAGNOSIS — T40601A Poisoning by unspecified narcotics, accidental (unintentional), initial encounter: Secondary | ICD-10-CM | POA: Diagnosis not present

## 2023-07-09 DIAGNOSIS — E7220 Disorder of urea cycle metabolism, unspecified: Secondary | ICD-10-CM | POA: Diagnosis not present

## 2023-07-09 DIAGNOSIS — G9341 Metabolic encephalopathy: Secondary | ICD-10-CM | POA: Diagnosis not present

## 2023-07-09 DIAGNOSIS — R404 Transient alteration of awareness: Secondary | ICD-10-CM | POA: Diagnosis not present

## 2023-07-09 DIAGNOSIS — R4182 Altered mental status, unspecified: Secondary | ICD-10-CM | POA: Diagnosis not present

## 2023-07-09 DIAGNOSIS — N390 Urinary tract infection, site not specified: Secondary | ICD-10-CM | POA: Diagnosis not present

## 2023-07-09 DIAGNOSIS — J810 Acute pulmonary edema: Secondary | ICD-10-CM | POA: Diagnosis not present

## 2023-07-09 DIAGNOSIS — R6520 Severe sepsis without septic shock: Secondary | ICD-10-CM | POA: Diagnosis not present

## 2023-07-09 DIAGNOSIS — I959 Hypotension, unspecified: Secondary | ICD-10-CM | POA: Diagnosis not present

## 2023-07-10 DIAGNOSIS — Z452 Encounter for adjustment and management of vascular access device: Secondary | ICD-10-CM | POA: Diagnosis not present

## 2023-07-10 DIAGNOSIS — A419 Sepsis, unspecified organism: Secondary | ICD-10-CM | POA: Diagnosis not present

## 2023-07-10 DIAGNOSIS — I289 Disease of pulmonary vessels, unspecified: Secondary | ICD-10-CM | POA: Diagnosis not present

## 2023-07-10 DIAGNOSIS — R6520 Severe sepsis without septic shock: Secondary | ICD-10-CM | POA: Diagnosis not present

## 2023-07-10 DIAGNOSIS — I493 Ventricular premature depolarization: Secondary | ICD-10-CM | POA: Diagnosis not present

## 2023-07-10 DIAGNOSIS — Z4659 Encounter for fitting and adjustment of other gastrointestinal appliance and device: Secondary | ICD-10-CM | POA: Diagnosis not present

## 2023-07-11 DIAGNOSIS — I493 Ventricular premature depolarization: Secondary | ICD-10-CM | POA: Diagnosis not present

## 2023-07-11 DIAGNOSIS — E876 Hypokalemia: Secondary | ICD-10-CM | POA: Diagnosis not present

## 2023-07-11 DIAGNOSIS — D539 Nutritional anemia, unspecified: Secondary | ICD-10-CM | POA: Diagnosis not present

## 2023-07-11 DIAGNOSIS — A419 Sepsis, unspecified organism: Secondary | ICD-10-CM | POA: Diagnosis not present

## 2023-07-11 DIAGNOSIS — E7220 Disorder of urea cycle metabolism, unspecified: Secondary | ICD-10-CM | POA: Diagnosis not present

## 2023-07-11 DIAGNOSIS — R6520 Severe sepsis without septic shock: Secondary | ICD-10-CM | POA: Diagnosis not present

## 2023-07-11 DIAGNOSIS — N186 End stage renal disease: Secondary | ICD-10-CM | POA: Diagnosis not present

## 2023-07-12 DIAGNOSIS — K760 Fatty (change of) liver, not elsewhere classified: Secondary | ICD-10-CM | POA: Diagnosis not present

## 2023-07-12 DIAGNOSIS — A419 Sepsis, unspecified organism: Secondary | ICD-10-CM | POA: Diagnosis not present

## 2023-07-12 DIAGNOSIS — E876 Hypokalemia: Secondary | ICD-10-CM | POA: Diagnosis not present

## 2023-07-12 DIAGNOSIS — E7220 Disorder of urea cycle metabolism, unspecified: Secondary | ICD-10-CM | POA: Diagnosis not present

## 2023-07-12 DIAGNOSIS — R188 Other ascites: Secondary | ICD-10-CM | POA: Diagnosis not present

## 2023-07-12 DIAGNOSIS — N186 End stage renal disease: Secondary | ICD-10-CM | POA: Diagnosis not present

## 2023-07-12 DIAGNOSIS — R6520 Severe sepsis without septic shock: Secondary | ICD-10-CM | POA: Diagnosis not present

## 2023-07-13 DIAGNOSIS — N390 Urinary tract infection, site not specified: Secondary | ICD-10-CM | POA: Diagnosis not present

## 2023-07-13 DIAGNOSIS — K7291 Hepatic failure, unspecified with coma: Secondary | ICD-10-CM | POA: Diagnosis not present

## 2023-07-13 DIAGNOSIS — J9612 Chronic respiratory failure with hypercapnia: Secondary | ICD-10-CM | POA: Diagnosis not present

## 2023-07-13 DIAGNOSIS — J9811 Atelectasis: Secondary | ICD-10-CM | POA: Diagnosis not present

## 2023-07-13 DIAGNOSIS — A419 Sepsis, unspecified organism: Secondary | ICD-10-CM | POA: Diagnosis not present

## 2023-07-13 DIAGNOSIS — G928 Other toxic encephalopathy: Secondary | ICD-10-CM | POA: Diagnosis not present

## 2023-07-13 DIAGNOSIS — N186 End stage renal disease: Secondary | ICD-10-CM | POA: Diagnosis not present

## 2023-07-13 DIAGNOSIS — J9611 Chronic respiratory failure with hypoxia: Secondary | ICD-10-CM | POA: Diagnosis not present

## 2023-07-13 DIAGNOSIS — R6520 Severe sepsis without septic shock: Secondary | ICD-10-CM | POA: Diagnosis not present

## 2023-07-14 DIAGNOSIS — I10 Essential (primary) hypertension: Secondary | ICD-10-CM | POA: Diagnosis not present

## 2023-07-14 DIAGNOSIS — A419 Sepsis, unspecified organism: Secondary | ICD-10-CM | POA: Diagnosis not present

## 2023-07-14 DIAGNOSIS — G8220 Paraplegia, unspecified: Secondary | ICD-10-CM | POA: Diagnosis not present

## 2023-07-14 DIAGNOSIS — J9611 Chronic respiratory failure with hypoxia: Secondary | ICD-10-CM | POA: Diagnosis not present

## 2023-07-14 DIAGNOSIS — G9340 Encephalopathy, unspecified: Secondary | ICD-10-CM | POA: Diagnosis not present

## 2023-07-14 DIAGNOSIS — K7291 Hepatic failure, unspecified with coma: Secondary | ICD-10-CM | POA: Diagnosis not present

## 2023-07-14 DIAGNOSIS — S12000S Unspecified displaced fracture of first cervical vertebra, sequela: Secondary | ICD-10-CM | POA: Diagnosis not present

## 2023-07-14 DIAGNOSIS — R6520 Severe sepsis without septic shock: Secondary | ICD-10-CM | POA: Diagnosis not present

## 2023-07-14 DIAGNOSIS — I959 Hypotension, unspecified: Secondary | ICD-10-CM | POA: Diagnosis not present

## 2023-07-14 DIAGNOSIS — E7220 Disorder of urea cycle metabolism, unspecified: Secondary | ICD-10-CM | POA: Diagnosis not present

## 2023-07-14 DIAGNOSIS — R159 Full incontinence of feces: Secondary | ICD-10-CM | POA: Diagnosis not present

## 2023-07-14 DIAGNOSIS — R0600 Dyspnea, unspecified: Secondary | ICD-10-CM | POA: Diagnosis not present

## 2023-07-14 DIAGNOSIS — R630 Anorexia: Secondary | ICD-10-CM | POA: Diagnosis not present

## 2023-07-14 DIAGNOSIS — E785 Hyperlipidemia, unspecified: Secondary | ICD-10-CM | POA: Diagnosis not present

## 2023-07-14 DIAGNOSIS — N390 Urinary tract infection, site not specified: Secondary | ICD-10-CM | POA: Diagnosis not present

## 2023-07-14 DIAGNOSIS — M109 Gout, unspecified: Secondary | ICD-10-CM | POA: Diagnosis not present

## 2023-07-14 DIAGNOSIS — K7581 Nonalcoholic steatohepatitis (NASH): Secondary | ICD-10-CM | POA: Diagnosis not present

## 2023-07-14 DIAGNOSIS — R162 Hepatomegaly with splenomegaly, not elsewhere classified: Secondary | ICD-10-CM | POA: Diagnosis not present

## 2023-07-14 DIAGNOSIS — D696 Thrombocytopenia, unspecified: Secondary | ICD-10-CM | POA: Diagnosis not present

## 2023-07-14 DIAGNOSIS — Z7401 Bed confinement status: Secondary | ICD-10-CM | POA: Diagnosis not present

## 2023-07-14 DIAGNOSIS — D649 Anemia, unspecified: Secondary | ICD-10-CM | POA: Diagnosis not present

## 2023-07-14 DIAGNOSIS — R32 Unspecified urinary incontinence: Secondary | ICD-10-CM | POA: Diagnosis not present

## 2023-07-14 DIAGNOSIS — N186 End stage renal disease: Secondary | ICD-10-CM | POA: Diagnosis not present

## 2023-07-14 DIAGNOSIS — R52 Pain, unspecified: Secondary | ICD-10-CM | POA: Diagnosis not present

## 2023-07-14 DIAGNOSIS — R609 Edema, unspecified: Secondary | ICD-10-CM | POA: Diagnosis not present

## 2023-07-14 DIAGNOSIS — G894 Chronic pain syndrome: Secondary | ICD-10-CM | POA: Diagnosis not present

## 2023-07-14 DIAGNOSIS — J9612 Chronic respiratory failure with hypercapnia: Secondary | ICD-10-CM | POA: Diagnosis not present

## 2023-07-14 DIAGNOSIS — G928 Other toxic encephalopathy: Secondary | ICD-10-CM | POA: Diagnosis not present

## 2023-07-14 DIAGNOSIS — K7201 Acute and subacute hepatic failure with coma: Secondary | ICD-10-CM | POA: Diagnosis not present

## 2023-07-14 DIAGNOSIS — R1312 Dysphagia, oropharyngeal phase: Secondary | ICD-10-CM | POA: Diagnosis not present

## 2023-07-14 DIAGNOSIS — J9811 Atelectasis: Secondary | ICD-10-CM | POA: Diagnosis not present

## 2023-07-15 DIAGNOSIS — R609 Edema, unspecified: Secondary | ICD-10-CM | POA: Diagnosis not present

## 2023-07-15 DIAGNOSIS — R0600 Dyspnea, unspecified: Secondary | ICD-10-CM | POA: Diagnosis not present

## 2023-07-15 DIAGNOSIS — N186 End stage renal disease: Secondary | ICD-10-CM | POA: Diagnosis not present

## 2023-07-15 DIAGNOSIS — R52 Pain, unspecified: Secondary | ICD-10-CM | POA: Diagnosis not present

## 2023-07-15 DIAGNOSIS — G9340 Encephalopathy, unspecified: Secondary | ICD-10-CM | POA: Diagnosis not present

## 2023-07-15 DIAGNOSIS — R1312 Dysphagia, oropharyngeal phase: Secondary | ICD-10-CM | POA: Diagnosis not present

## 2023-07-17 DIAGNOSIS — R52 Pain, unspecified: Secondary | ICD-10-CM | POA: Diagnosis not present

## 2023-07-17 DIAGNOSIS — G9340 Encephalopathy, unspecified: Secondary | ICD-10-CM | POA: Diagnosis not present

## 2023-07-17 DIAGNOSIS — R609 Edema, unspecified: Secondary | ICD-10-CM | POA: Diagnosis not present

## 2023-07-17 DIAGNOSIS — N186 End stage renal disease: Secondary | ICD-10-CM | POA: Diagnosis not present

## 2023-07-17 DIAGNOSIS — R1312 Dysphagia, oropharyngeal phase: Secondary | ICD-10-CM | POA: Diagnosis not present

## 2023-07-17 DIAGNOSIS — R0600 Dyspnea, unspecified: Secondary | ICD-10-CM | POA: Diagnosis not present

## 2023-07-18 DIAGNOSIS — N186 End stage renal disease: Secondary | ICD-10-CM | POA: Diagnosis not present

## 2023-07-18 DIAGNOSIS — R0600 Dyspnea, unspecified: Secondary | ICD-10-CM | POA: Diagnosis not present

## 2023-07-18 DIAGNOSIS — R52 Pain, unspecified: Secondary | ICD-10-CM | POA: Diagnosis not present

## 2023-07-18 DIAGNOSIS — R1312 Dysphagia, oropharyngeal phase: Secondary | ICD-10-CM | POA: Diagnosis not present

## 2023-07-18 DIAGNOSIS — R609 Edema, unspecified: Secondary | ICD-10-CM | POA: Diagnosis not present

## 2023-07-18 DIAGNOSIS — G9340 Encephalopathy, unspecified: Secondary | ICD-10-CM | POA: Diagnosis not present
